# Patient Record
Sex: Female | Born: 1965 | Race: Black or African American | Hispanic: No | Marital: Married | Smoking: Former smoker
Health system: Southern US, Community
[De-identification: ages and names within clinical notes are randomized; demographics above are authoritative.]

## PROBLEM LIST (undated history)

## (undated) DIAGNOSIS — K219 Gastro-esophageal reflux disease without esophagitis: Secondary | ICD-10-CM

## (undated) DIAGNOSIS — I10 Essential (primary) hypertension: Secondary | ICD-10-CM

## (undated) DIAGNOSIS — R51 Headache: Secondary | ICD-10-CM

## (undated) DIAGNOSIS — M1990 Unspecified osteoarthritis, unspecified site: Secondary | ICD-10-CM

## (undated) DIAGNOSIS — E559 Vitamin D deficiency, unspecified: Secondary | ICD-10-CM

## (undated) DIAGNOSIS — Z86010 Personal history of colonic polyps: Secondary | ICD-10-CM

## (undated) DIAGNOSIS — H40059 Ocular hypertension, unspecified eye: Secondary | ICD-10-CM

## (undated) DIAGNOSIS — D242 Benign neoplasm of left breast: Secondary | ICD-10-CM

## (undated) DIAGNOSIS — F419 Anxiety disorder, unspecified: Secondary | ICD-10-CM

## (undated) DIAGNOSIS — R9439 Abnormal result of other cardiovascular function study: Secondary | ICD-10-CM

## (undated) DIAGNOSIS — E669 Obesity, unspecified: Secondary | ICD-10-CM

## (undated) DIAGNOSIS — Z8614 Personal history of Methicillin resistant Staphylococcus aureus infection: Secondary | ICD-10-CM

## (undated) DIAGNOSIS — Z91018 Allergy to other foods: Secondary | ICD-10-CM

## (undated) DIAGNOSIS — M779 Enthesopathy, unspecified: Secondary | ICD-10-CM

## (undated) DIAGNOSIS — E119 Type 2 diabetes mellitus without complications: Secondary | ICD-10-CM

## (undated) DIAGNOSIS — F32A Depression, unspecified: Secondary | ICD-10-CM

## (undated) DIAGNOSIS — F329 Major depressive disorder, single episode, unspecified: Secondary | ICD-10-CM

## (undated) DIAGNOSIS — J45909 Unspecified asthma, uncomplicated: Secondary | ICD-10-CM

## (undated) HISTORY — DX: Personal history of Methicillin resistant Staphylococcus aureus infection: Z86.14

## (undated) HISTORY — PX: KNEE SURGERY: SHX244

## (undated) HISTORY — PX: ABDOMINAL HYSTERECTOMY: SHX81

## (undated) HISTORY — DX: Abnormal result of other cardiovascular function study: R94.39

## (undated) HISTORY — PX: BOWEL RESECTION: SHX1257

---

## 1997-10-07 ENCOUNTER — Emergency Department (HOSPITAL_COMMUNITY): Admission: EM | Admit: 1997-10-07 | Discharge: 1997-10-07 | Payer: Self-pay | Admitting: Emergency Medicine

## 1998-05-06 ENCOUNTER — Emergency Department (HOSPITAL_COMMUNITY): Admission: EM | Admit: 1998-05-06 | Discharge: 1998-05-06 | Payer: Self-pay | Admitting: Emergency Medicine

## 1999-09-18 ENCOUNTER — Other Ambulatory Visit: Admission: RE | Admit: 1999-09-18 | Discharge: 1999-09-18 | Payer: Self-pay | Admitting: Internal Medicine

## 2000-03-25 ENCOUNTER — Emergency Department (HOSPITAL_COMMUNITY): Admission: EM | Admit: 2000-03-25 | Discharge: 2000-03-25 | Payer: Self-pay | Admitting: Emergency Medicine

## 2000-05-13 ENCOUNTER — Emergency Department (HOSPITAL_COMMUNITY): Admission: EM | Admit: 2000-05-13 | Discharge: 2000-05-13 | Payer: Self-pay | Admitting: *Deleted

## 2003-05-03 ENCOUNTER — Emergency Department (HOSPITAL_COMMUNITY): Admission: AD | Admit: 2003-05-03 | Discharge: 2003-05-03 | Payer: Self-pay | Admitting: Emergency Medicine

## 2005-04-06 DIAGNOSIS — Z8614 Personal history of Methicillin resistant Staphylococcus aureus infection: Secondary | ICD-10-CM

## 2005-04-06 HISTORY — DX: Personal history of Methicillin resistant Staphylococcus aureus infection: Z86.14

## 2006-01-11 ENCOUNTER — Emergency Department (HOSPITAL_COMMUNITY): Admission: EM | Admit: 2006-01-11 | Discharge: 2006-01-11 | Payer: Self-pay | Admitting: Emergency Medicine

## 2006-08-05 ENCOUNTER — Inpatient Hospital Stay (HOSPITAL_COMMUNITY): Admission: AD | Admit: 2006-08-05 | Discharge: 2006-08-05 | Payer: Self-pay | Admitting: Obstetrics & Gynecology

## 2006-08-07 ENCOUNTER — Inpatient Hospital Stay (HOSPITAL_COMMUNITY): Admission: AD | Admit: 2006-08-07 | Discharge: 2006-08-07 | Payer: Self-pay | Admitting: Obstetrics & Gynecology

## 2006-08-11 ENCOUNTER — Ambulatory Visit: Payer: Self-pay | Admitting: *Deleted

## 2006-08-11 ENCOUNTER — Encounter (INDEPENDENT_AMBULATORY_CARE_PROVIDER_SITE_OTHER): Payer: Self-pay | Admitting: *Deleted

## 2006-11-02 ENCOUNTER — Inpatient Hospital Stay (HOSPITAL_COMMUNITY): Admission: EM | Admit: 2006-11-02 | Discharge: 2006-11-04 | Payer: Self-pay | Admitting: Cardiology

## 2006-11-02 ENCOUNTER — Encounter: Payer: Self-pay | Admitting: Emergency Medicine

## 2006-11-02 ENCOUNTER — Ambulatory Visit: Payer: Self-pay | Admitting: *Deleted

## 2006-11-22 ENCOUNTER — Emergency Department (HOSPITAL_COMMUNITY): Admission: EM | Admit: 2006-11-22 | Discharge: 2006-11-23 | Payer: Self-pay | Admitting: Emergency Medicine

## 2006-11-23 ENCOUNTER — Ambulatory Visit (HOSPITAL_COMMUNITY): Admission: RE | Admit: 2006-11-23 | Discharge: 2006-11-23 | Payer: Self-pay | Admitting: Family Medicine

## 2006-11-25 ENCOUNTER — Emergency Department (HOSPITAL_COMMUNITY): Admission: EM | Admit: 2006-11-25 | Discharge: 2006-11-26 | Payer: Self-pay | Admitting: Emergency Medicine

## 2006-12-13 ENCOUNTER — Inpatient Hospital Stay (HOSPITAL_COMMUNITY): Admission: AD | Admit: 2006-12-13 | Discharge: 2006-12-14 | Payer: Self-pay | Admitting: Obstetrics & Gynecology

## 2006-12-17 ENCOUNTER — Ambulatory Visit: Payer: Self-pay | Admitting: Obstetrics and Gynecology

## 2006-12-23 ENCOUNTER — Ambulatory Visit: Payer: Self-pay | Admitting: Obstetrics and Gynecology

## 2006-12-31 ENCOUNTER — Ambulatory Visit (HOSPITAL_COMMUNITY): Admission: RE | Admit: 2006-12-31 | Discharge: 2006-12-31 | Payer: Self-pay | Admitting: Obstetrics and Gynecology

## 2006-12-31 ENCOUNTER — Encounter: Payer: Self-pay | Admitting: Obstetrics and Gynecology

## 2006-12-31 ENCOUNTER — Ambulatory Visit: Payer: Self-pay | Admitting: Obstetrics and Gynecology

## 2007-01-07 ENCOUNTER — Ambulatory Visit (HOSPITAL_COMMUNITY): Admission: RE | Admit: 2007-01-07 | Discharge: 2007-01-07 | Payer: Self-pay | Admitting: Obstetrics and Gynecology

## 2007-01-13 ENCOUNTER — Ambulatory Visit: Payer: Self-pay | Admitting: Obstetrics and Gynecology

## 2007-01-26 ENCOUNTER — Ambulatory Visit: Payer: Self-pay | Admitting: Obstetrics & Gynecology

## 2007-01-26 ENCOUNTER — Inpatient Hospital Stay (HOSPITAL_COMMUNITY): Admission: AD | Admit: 2007-01-26 | Discharge: 2007-01-26 | Payer: Self-pay | Admitting: Obstetrics & Gynecology

## 2007-02-03 ENCOUNTER — Inpatient Hospital Stay (HOSPITAL_COMMUNITY): Admission: AD | Admit: 2007-02-03 | Discharge: 2007-02-03 | Payer: Self-pay | Admitting: Gynecology

## 2007-02-27 ENCOUNTER — Inpatient Hospital Stay (HOSPITAL_COMMUNITY): Admission: AD | Admit: 2007-02-27 | Discharge: 2007-02-27 | Payer: Self-pay | Admitting: Obstetrics and Gynecology

## 2007-03-14 ENCOUNTER — Ambulatory Visit: Payer: Self-pay | Admitting: Obstetrics and Gynecology

## 2007-03-14 ENCOUNTER — Inpatient Hospital Stay (HOSPITAL_COMMUNITY): Admission: RE | Admit: 2007-03-14 | Discharge: 2007-03-25 | Payer: Self-pay | Admitting: Obstetrics and Gynecology

## 2007-03-14 ENCOUNTER — Encounter: Payer: Self-pay | Admitting: Obstetrics and Gynecology

## 2007-03-28 ENCOUNTER — Inpatient Hospital Stay (HOSPITAL_COMMUNITY): Admission: AD | Admit: 2007-03-28 | Discharge: 2007-03-28 | Payer: Self-pay | Admitting: Gynecology

## 2007-03-28 ENCOUNTER — Ambulatory Visit: Payer: Self-pay | Admitting: Obstetrics and Gynecology

## 2007-04-03 ENCOUNTER — Inpatient Hospital Stay (HOSPITAL_COMMUNITY): Admission: AD | Admit: 2007-04-03 | Discharge: 2007-04-18 | Payer: Self-pay | Admitting: Obstetrics & Gynecology

## 2007-04-04 ENCOUNTER — Encounter (INDEPENDENT_AMBULATORY_CARE_PROVIDER_SITE_OTHER): Payer: Self-pay | Admitting: Surgery

## 2007-04-14 ENCOUNTER — Encounter: Payer: Self-pay | Admitting: Surgery

## 2007-04-23 ENCOUNTER — Emergency Department (HOSPITAL_COMMUNITY): Admission: EM | Admit: 2007-04-23 | Discharge: 2007-04-24 | Payer: Self-pay | Admitting: *Deleted

## 2007-04-29 ENCOUNTER — Other Ambulatory Visit: Payer: Self-pay | Admitting: Obstetrics & Gynecology

## 2007-04-29 ENCOUNTER — Emergency Department (HOSPITAL_COMMUNITY): Admission: EM | Admit: 2007-04-29 | Discharge: 2007-04-29 | Payer: Self-pay | Admitting: Emergency Medicine

## 2007-04-29 ENCOUNTER — Inpatient Hospital Stay (HOSPITAL_COMMUNITY): Admission: AD | Admit: 2007-04-29 | Discharge: 2007-04-29 | Payer: Self-pay | Admitting: Obstetrics & Gynecology

## 2008-05-22 ENCOUNTER — Encounter: Admission: RE | Admit: 2008-05-22 | Discharge: 2008-05-22 | Payer: Self-pay | Admitting: Family Medicine

## 2008-10-13 ENCOUNTER — Emergency Department (HOSPITAL_COMMUNITY): Admission: EM | Admit: 2008-10-13 | Discharge: 2008-10-13 | Payer: Self-pay | Admitting: Emergency Medicine

## 2008-10-28 ENCOUNTER — Inpatient Hospital Stay (HOSPITAL_COMMUNITY): Admission: EM | Admit: 2008-10-28 | Discharge: 2008-11-02 | Payer: Self-pay | Admitting: Emergency Medicine

## 2008-10-31 DIAGNOSIS — F411 Generalized anxiety disorder: Secondary | ICD-10-CM | POA: Insufficient documentation

## 2008-10-31 HISTORY — DX: Generalized anxiety disorder: F41.1

## 2008-11-29 DIAGNOSIS — K219 Gastro-esophageal reflux disease without esophagitis: Secondary | ICD-10-CM | POA: Insufficient documentation

## 2008-12-02 ENCOUNTER — Inpatient Hospital Stay (HOSPITAL_COMMUNITY): Admission: EM | Admit: 2008-12-02 | Discharge: 2008-12-04 | Payer: Self-pay | Admitting: Emergency Medicine

## 2009-01-19 ENCOUNTER — Emergency Department (HOSPITAL_COMMUNITY): Admission: EM | Admit: 2009-01-19 | Discharge: 2009-01-19 | Payer: Self-pay | Admitting: Emergency Medicine

## 2009-02-23 ENCOUNTER — Inpatient Hospital Stay (HOSPITAL_COMMUNITY): Admission: EM | Admit: 2009-02-23 | Discharge: 2009-02-25 | Payer: Self-pay | Admitting: Emergency Medicine

## 2009-03-05 DIAGNOSIS — K7689 Other specified diseases of liver: Secondary | ICD-10-CM | POA: Insufficient documentation

## 2009-03-05 DIAGNOSIS — I10 Essential (primary) hypertension: Secondary | ICD-10-CM | POA: Insufficient documentation

## 2009-03-05 DIAGNOSIS — E1159 Type 2 diabetes mellitus with other circulatory complications: Secondary | ICD-10-CM | POA: Insufficient documentation

## 2009-03-05 DIAGNOSIS — F329 Major depressive disorder, single episode, unspecified: Secondary | ICD-10-CM | POA: Insufficient documentation

## 2009-03-06 ENCOUNTER — Ambulatory Visit: Payer: Self-pay | Admitting: Cardiology

## 2009-03-06 DIAGNOSIS — R079 Chest pain, unspecified: Secondary | ICD-10-CM

## 2009-03-06 DIAGNOSIS — M549 Dorsalgia, unspecified: Secondary | ICD-10-CM | POA: Insufficient documentation

## 2009-03-12 ENCOUNTER — Telehealth (INDEPENDENT_AMBULATORY_CARE_PROVIDER_SITE_OTHER): Payer: Self-pay | Admitting: *Deleted

## 2009-03-13 ENCOUNTER — Telehealth (INDEPENDENT_AMBULATORY_CARE_PROVIDER_SITE_OTHER): Payer: Self-pay

## 2009-03-14 ENCOUNTER — Telehealth (INDEPENDENT_AMBULATORY_CARE_PROVIDER_SITE_OTHER): Payer: Self-pay

## 2009-03-15 ENCOUNTER — Telehealth: Payer: Self-pay | Admitting: Cardiology

## 2009-03-15 ENCOUNTER — Encounter: Payer: Self-pay | Admitting: Cardiology

## 2009-03-19 ENCOUNTER — Telehealth (INDEPENDENT_AMBULATORY_CARE_PROVIDER_SITE_OTHER): Payer: Self-pay

## 2009-03-27 ENCOUNTER — Telehealth (INDEPENDENT_AMBULATORY_CARE_PROVIDER_SITE_OTHER): Payer: Self-pay | Admitting: *Deleted

## 2009-03-28 ENCOUNTER — Ambulatory Visit: Payer: Self-pay | Admitting: Internal Medicine

## 2009-03-28 ENCOUNTER — Ambulatory Visit: Payer: Self-pay

## 2009-03-28 ENCOUNTER — Ambulatory Visit: Payer: Self-pay | Admitting: Cardiology

## 2009-03-28 ENCOUNTER — Encounter (HOSPITAL_COMMUNITY): Admission: RE | Admit: 2009-03-28 | Discharge: 2009-04-05 | Payer: Self-pay | Admitting: Cardiology

## 2009-03-29 ENCOUNTER — Emergency Department (HOSPITAL_COMMUNITY): Admission: EM | Admit: 2009-03-29 | Discharge: 2009-03-30 | Payer: Self-pay | Admitting: Emergency Medicine

## 2009-04-01 ENCOUNTER — Encounter: Payer: Self-pay | Admitting: Cardiology

## 2009-04-02 ENCOUNTER — Telehealth: Payer: Self-pay | Admitting: Cardiology

## 2009-04-02 LAB — CONVERTED CEMR LAB
BUN: 10 mg/dL
CO2: 28 meq/L
Calcium: 8.6 mg/dL
Chloride: 100 meq/L
Creatinine, Ser: 0.6 mg/dL
GFR calc non Af Amer: 138.91 mL/min
Glucose, Bld: 104 mg/dL — ABNORMAL HIGH
Potassium: 3.7 meq/L
Sodium: 137 meq/L

## 2009-04-08 ENCOUNTER — Telehealth: Payer: Self-pay | Admitting: Cardiology

## 2009-04-28 IMAGING — CT CT PELVIS W/ CM
1 of 6 series · 13 of 32 positions shown, 18 images · IV contrast ([ID] GASTRO-MX & [ID] OMNIP 300%)
Comparison: 11/23/06.

CLINICAL DATA: Approximately 3 weeks postop from hysterectomy.  Abdominal and pelvic pain.  Fever and nausea.  Evaluate for abscess.
ABDOMEN CT WITH CONTRAST:
TECHNIQUE: Multidetector CT imaging of the abdomen was performed following the standard protocol during bolus administration of intravenous contrast.
Contrast:  100 cc Omnipaque 300 IV and oral contrast.
TECHNIQUE: Multidetector CT imaging of the pelvis was performed following the standard protocol during bolus administration of intravenous contrast.

[Series 4: recon 3: abd pelvis · axial · 0.70mm/px · z∈[-414,-46]mm · 13 of 426 slices shown, 18 images]
[im 29/426  soft-tissue]
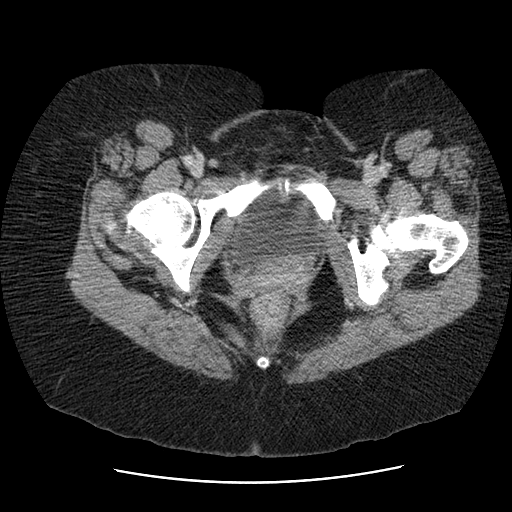
[im 29/426  bone]
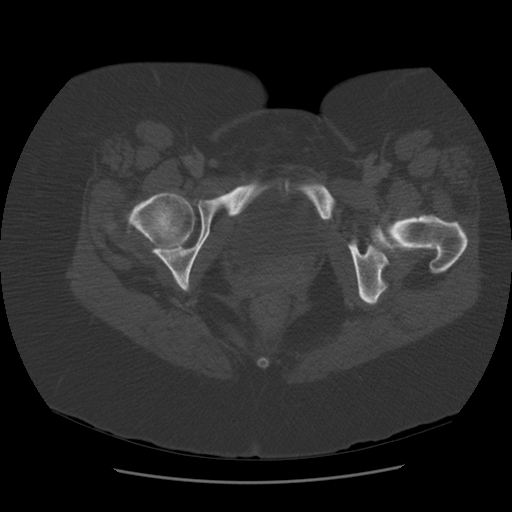
[im 57/426  soft-tissue]
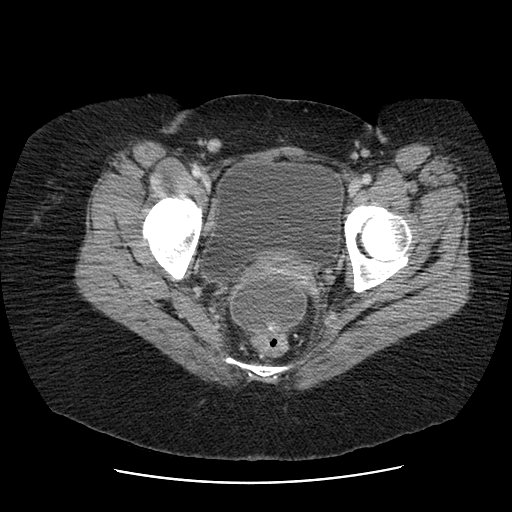
[im 86/426  soft-tissue]
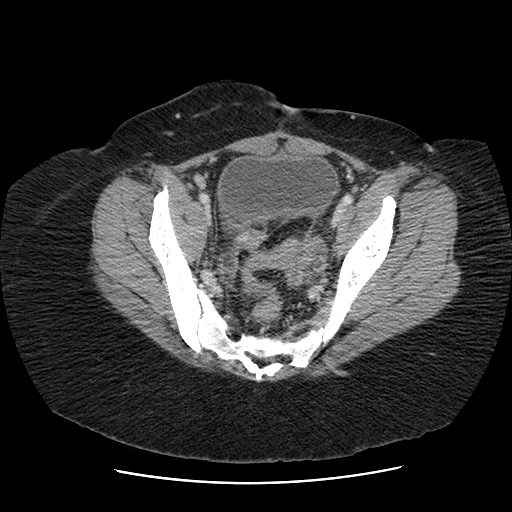
[im 142/426  soft-tissue]
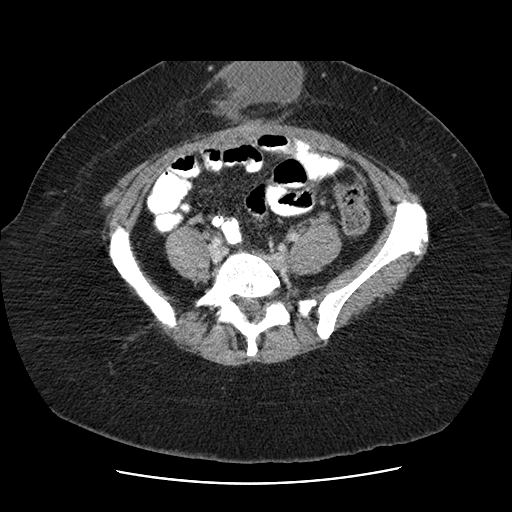
[im 171/426  soft-tissue]
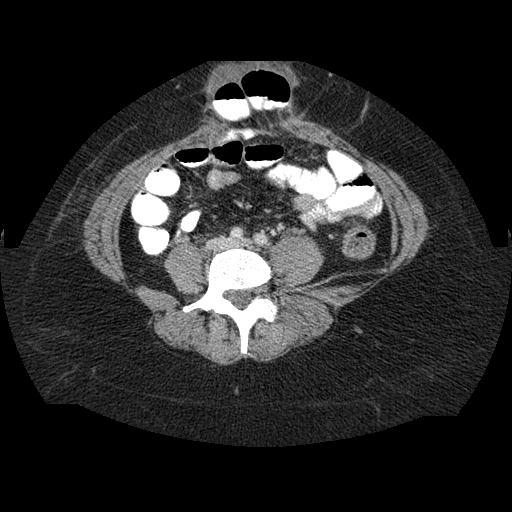
[im 199/426  soft-tissue]
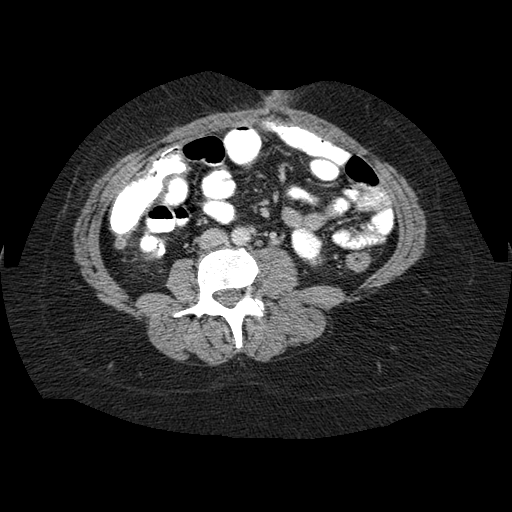
[im 227/426  soft-tissue]
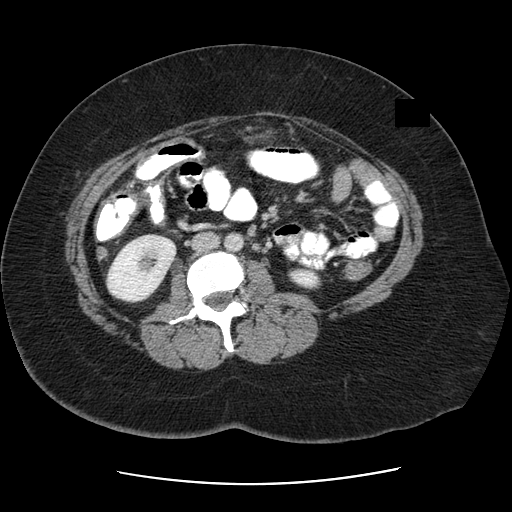
[im 256/426  soft-tissue]
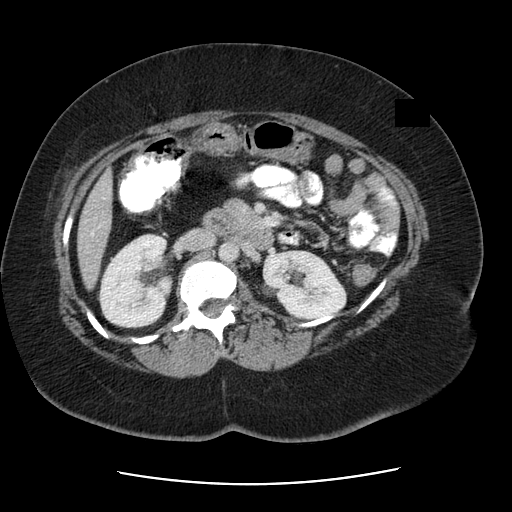
[im 284/426  soft-tissue]
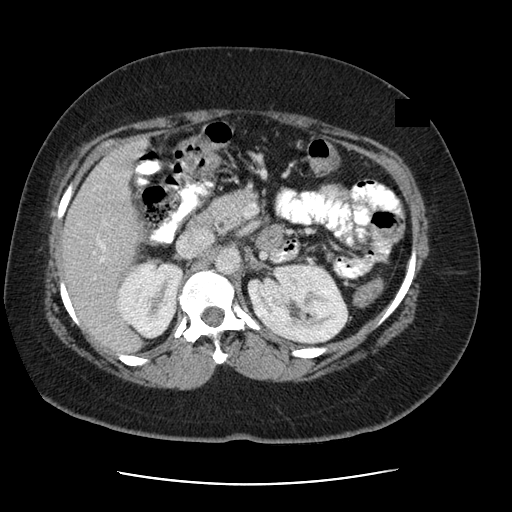
[im 284/426  bone]
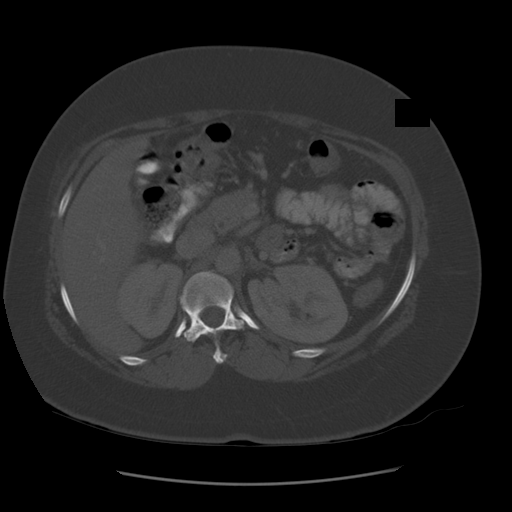
[im 312/426  lung]
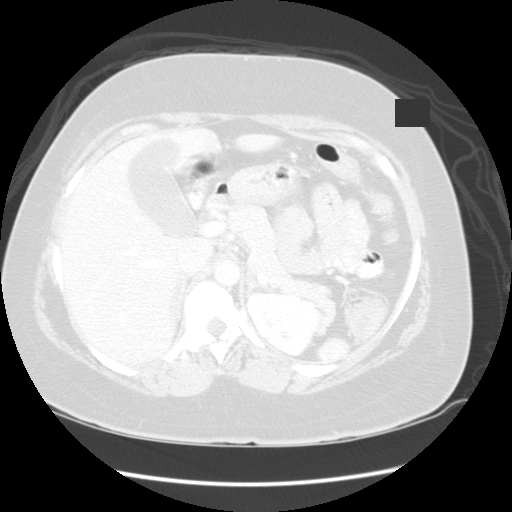
[im 341/426  soft-tissue]
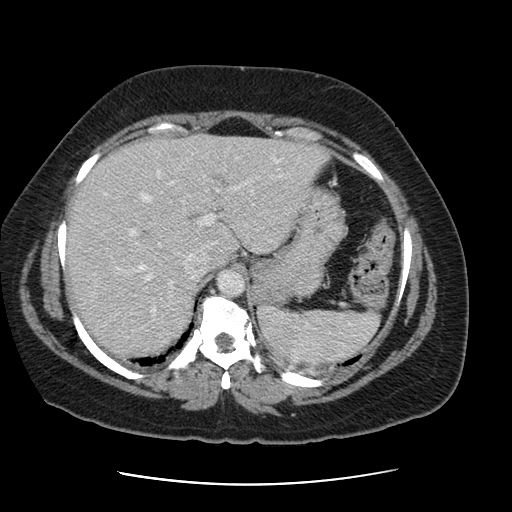
[im 341/426  lung]
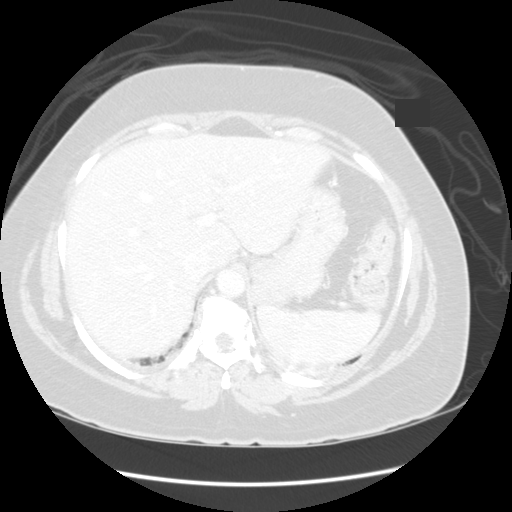
[im 369/426  soft-tissue]
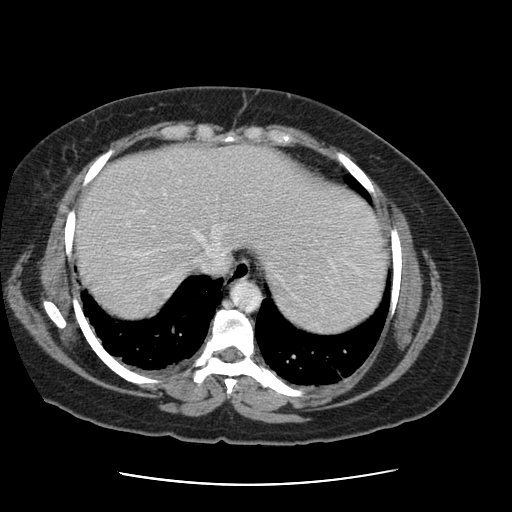
[im 369/426  lung]
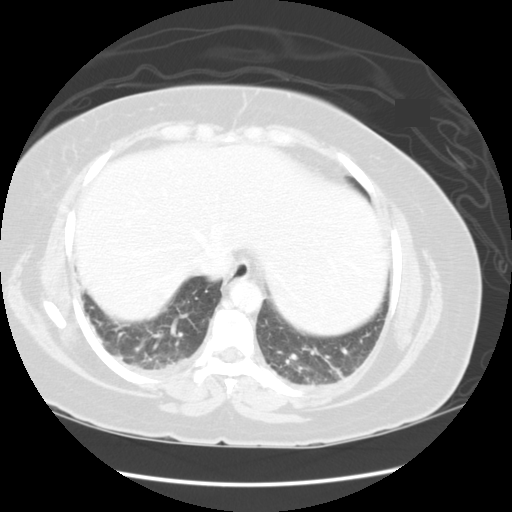
[im 397/426  soft-tissue]
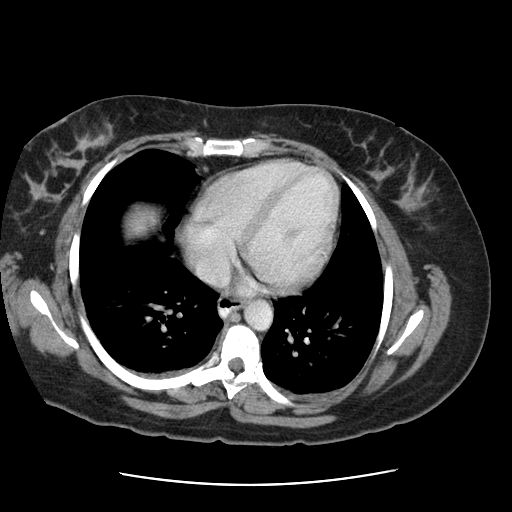
[im 397/426  lung]
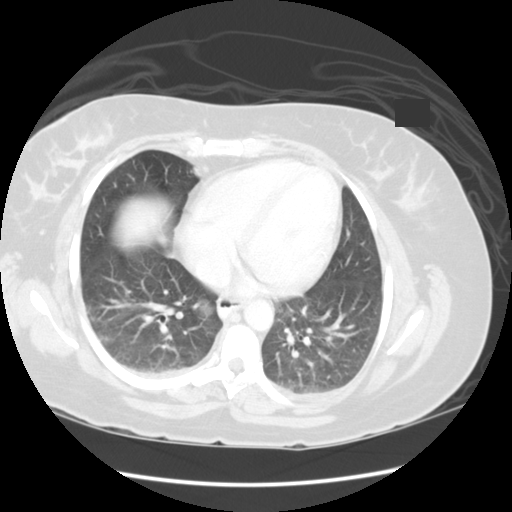

[13 of 32 positions shown; findings below may reference images not displayed]

FINDINGS: Tiny pleural effusions are noted bilaterally with minimal bibasilar atelectasis.  The liver, gallbladder, pancreas, spleen, adrenal glands, and kidneys are unremarkable in appearance except for a tiny less than 1 cm cyst in the lower pole of the left kidney.   There is no evidence of hydronephrosis.   There is no evidence of abscess or ascites within the abdomen.   There is no evidence of mass or dilated bowel loops.
IMPRESSION: 1.  No evidence of abscess or other acute findings within the abdomen.
2.  Tiny bilateral pleural effusions and minimal bibasilar atelectasis.
PELVIS CT WITH CONTRAST:
FINDINGS: A periumbilical anterior abdominal wall hernia is seen containing a loop of small bowel.   There is no evidence of bowel obstruction.  Fluid is also seen within the hernia sac inferior to the herniated bowel loops which measures 5.5 x 3.8 cm.   Abscess cannot be excluded.  
The uterus is surgically absent.  A rim-enhancing fluid collection is seen within the pelvic cul-de-sac which is new and measures 5.3 x 3.9 cm.   This is also suspicious for abscess.  There is mild stranding within the pelvic fat, but no other abscess is identified.   There is no evidence of pelvic soft tissue mass.
IMPRESSION: 1.  Postoperative changes from recent hysterectomy.   5.3 x 3.9 cm rim-enhancing fluid collection in the pelvic cul-de-sac, suspicious for abscess.
2.  New periumbilical anterior abdominal wall hernia containing a loop of small bowel.  5.5 x 3.8 cm fluid collection in the hernia sac inferior to bowel loops; abscess cannot be excluded.
This report is delayed due to PACS failure.

## 2009-05-07 ENCOUNTER — Encounter (INDEPENDENT_AMBULATORY_CARE_PROVIDER_SITE_OTHER): Payer: Self-pay | Admitting: *Deleted

## 2009-10-02 ENCOUNTER — Ambulatory Visit: Payer: Self-pay | Admitting: Obstetrics and Gynecology

## 2009-10-04 ENCOUNTER — Encounter: Admission: RE | Admit: 2009-10-04 | Discharge: 2009-10-04 | Payer: Self-pay | Admitting: Obstetrics & Gynecology

## 2009-10-09 ENCOUNTER — Emergency Department (HOSPITAL_COMMUNITY): Admission: EM | Admit: 2009-10-09 | Discharge: 2009-10-09 | Payer: Self-pay | Admitting: Emergency Medicine

## 2009-11-27 ENCOUNTER — Emergency Department (HOSPITAL_COMMUNITY): Admission: EM | Admit: 2009-11-27 | Discharge: 2009-11-27 | Payer: Self-pay | Admitting: Emergency Medicine

## 2009-12-03 ENCOUNTER — Emergency Department (HOSPITAL_COMMUNITY): Admission: EM | Admit: 2009-12-03 | Discharge: 2009-12-03 | Payer: Self-pay | Admitting: Emergency Medicine

## 2010-01-05 ENCOUNTER — Ambulatory Visit: Payer: Self-pay | Admitting: Cardiology

## 2010-01-05 ENCOUNTER — Observation Stay (HOSPITAL_COMMUNITY): Admission: EM | Admit: 2010-01-05 | Discharge: 2010-01-07 | Payer: Self-pay | Admitting: Emergency Medicine

## 2010-01-05 ENCOUNTER — Ambulatory Visit: Payer: Self-pay | Admitting: Cardiovascular Disease

## 2010-01-06 ENCOUNTER — Encounter (INDEPENDENT_AMBULATORY_CARE_PROVIDER_SITE_OTHER): Payer: Self-pay | Admitting: Internal Medicine

## 2010-01-13 ENCOUNTER — Emergency Department (HOSPITAL_COMMUNITY): Admission: EM | Admit: 2010-01-13 | Discharge: 2010-01-13 | Payer: Self-pay | Admitting: Emergency Medicine

## 2010-04-11 ENCOUNTER — Encounter
Admission: RE | Admit: 2010-04-11 | Discharge: 2010-04-11 | Payer: Self-pay | Source: Home / Self Care | Attending: Family Medicine | Admitting: Family Medicine

## 2010-04-27 ENCOUNTER — Encounter: Payer: Self-pay | Admitting: Obstetrics and Gynecology

## 2010-04-27 ENCOUNTER — Encounter: Payer: Self-pay | Admitting: *Deleted

## 2010-05-04 LAB — CONVERTED CEMR LAB
Alkaline Phosphatase: 61 units/L (ref 39–117)
Bilirubin, Direct: 0 mg/dL (ref 0.0–0.3)
HDL: 55.1 mg/dL (ref >39.00)
LDL Cholesterol: 79 mg/dL (ref 0–99)
Total Bilirubin: 0.7 mg/dL (ref 0.3–1.2)
Total CHOL/HDL Ratio: 3
Total Protein: 6.9 g/dL (ref 6.0–8.3)
Triglycerides: 132 mg/dL (ref 0.0–149.0)
VLDL: 26.4 mg/dL (ref 0.0–40.0)

## 2010-05-06 NOTE — Miscellaneous (Signed)
Summary: Outpatient Coinsurance Notice  Outpatient Coinsurance Notice   Imported By: Marylou Mccoy 04/17/2009 14:54:10  _____________________________________________________________________  External Attachment:    Type:   Image     Comment:   External Document

## 2010-05-06 NOTE — Medication Information (Signed)
Summary: BP Monitor  BP Monitor   Imported By: Marylou Mccoy 04/16/2009 16:31:43  _____________________________________________________________________  External Attachment:    Type:   Image     Comment:   External Document

## 2010-05-06 NOTE — Progress Notes (Signed)
Summary: patient has flu/what can she take for relief of symptoms  Phone Note Call from Patient Call back at [phone]   Caller: Patient Reason for Call: Talk to Nurse Summary of Call: Patient has flu.  Would like to know what she can take to relieve symptoms. Initial call taken by: Burnard Leigh,  April 08, 2009 8:43 AM  Follow-up for Phone Call        talked with patient--pt has had flu for several days---dry cough and temperature--wants recommendation for OTC med for symptoms--will review with Dr Gae Gallop, RN, BSN  April 08, 2009 9:06 AM      Appended Document: patient has flu/what can she take for relief of symptoms OK to take Tylenol, Coricidin, Benadryl.  Would avoid phenylephrine-containing products.   Appended Document: patient has flu/what can she take for relief of symptoms talked with patient

## 2010-05-06 NOTE — Letter (Signed)
Summary: Appointment - Missed  [facility], Main Office  [address]   [place], [place] [address]   Phone: [phone]  Fax: [phone]     May 07, 2009 MRN: [id number]   [facility] [address] [place], [place]  [address]   Dear Ms. Benjamine Mola,  Our records indicate you missed your appointment on 05/06/2009 with Dr. Shirlee Latch.  It is very important that we reach you to reschedule this appointment. We look forward to participating in your health care needs. Please contact us at the number listed above at your earliest convenience to reschedule this appointment.     Sincerely,   [facility] Scheduling Team

## 2010-06-19 LAB — COMPREHENSIVE METABOLIC PANEL
Alkaline Phosphatase: 97 U/L (ref 39–117)
BUN: 13 mg/dL (ref 6–23)
CO2: 28 mEq/L (ref 19–32)
Chloride: 106 mEq/L (ref 96–112)
Creatinine, Ser: 0.62 mg/dL (ref 0.4–1.2)
GFR calc non Af Amer: >60 mL/min (ref >60)
Glucose, Bld: 150 mg/dL — ABNORMAL HIGH (ref 70–99)
Potassium: 3.2 mEq/L — ABNORMAL LOW (ref 3.5–5.1)
Total Bilirubin: 0.3 mg/dL (ref 0.3–1.2)
Total Protein: 7.7 g/dL (ref 6.0–8.3)

## 2010-06-19 LAB — CK TOTAL AND CKMB (NOT AT ARMC)
CK, MB: 0.5 ng/mL (ref 0.3–4.0)
Total CK: 48 U/L (ref 7–177)

## 2010-06-19 LAB — BASIC METABOLIC PANEL
BUN: 11 mg/dL (ref 6–23)
BUN: 12 mg/dL (ref 6–23)
BUN: 8 mg/dL (ref 6–23)
CO2: 25 mEq/L (ref 19–32)
CO2: 30 mEq/L (ref 19–32)
Calcium: 8.4 mg/dL (ref 8.4–10.5)
Calcium: 8.9 mg/dL (ref 8.4–10.5)
Calcium: 9.4 mg/dL (ref 8.4–10.5)
Chloride: 104 mEq/L (ref 96–112)
Creatinine, Ser: 0.59 mg/dL (ref 0.4–1.2)
Creatinine, Ser: 0.75 mg/dL (ref 0.4–1.2)
GFR calc Af Amer: >60 mL/min (ref >60)
GFR calc Af Amer: >60 mL/min (ref >60)
GFR calc Af Amer: >60 mL/min (ref >60)
GFR calc non Af Amer: >60 mL/min (ref >60)
GFR calc non Af Amer: >60 mL/min (ref >60)
GFR calc non Af Amer: >60 mL/min (ref >60)
Glucose, Bld: 115 mg/dL — ABNORMAL HIGH (ref 70–99)
Glucose, Bld: 99 mg/dL (ref 70–99)
Potassium: 3.4 mEq/L — ABNORMAL LOW (ref 3.5–5.1)
Potassium: 3.7 mEq/L (ref 3.5–5.1)
Sodium: 140 mEq/L (ref 135–145)
Sodium: 140 mEq/L (ref 135–145)
Sodium: 142 mEq/L (ref 135–145)

## 2010-06-19 LAB — DIFFERENTIAL
Basophils Absolute: 0 10*3/uL (ref 0.0–0.1)
Basophils Absolute: 0.1 10*3/uL (ref 0.0–0.1)
Basophils Relative: 0 % (ref 0–1)
Basophils Relative: 1 % (ref 0–1)
Lymphocytes Relative: 37 % (ref 12–46)
Lymphocytes Relative: 43 % (ref 12–46)
Lymphocytes Relative: 50 % — ABNORMAL HIGH (ref 12–46)
Lymphs Abs: 3.2 10*3/uL (ref 0.7–4.0)
Lymphs Abs: 5.7 10*3/uL — ABNORMAL HIGH (ref 0.7–4.0)
Monocytes Absolute: 0.4 10*3/uL (ref 0.1–1.0)
Monocytes Absolute: 0.4 10*3/uL (ref 0.1–1.0)
Monocytes Relative: 3 % (ref 3–12)
Monocytes Relative: 5 % (ref 3–12)
Neutro Abs: 3.5 10*3/uL (ref 1.7–7.7)
Neutro Abs: 4.1 10*3/uL (ref 1.7–7.7)
Neutro Abs: 5.1 10*3/uL (ref 1.7–7.7)
Neutrophils Relative %: 44 % (ref 43–77)
Neutrophils Relative %: 48 % (ref 43–77)
Neutrophils Relative %: 55 % (ref 43–77)

## 2010-06-19 LAB — POCT CARDIAC MARKERS
CKMB, poc: <1 ng/mL — ABNORMAL LOW (ref 1.0–8.0)
Myoglobin, poc: 16.1 ng/mL (ref 12–200)
Troponin i, poc: <0.05 ng/mL (ref 0.00–0.09)

## 2010-06-19 LAB — D-DIMER, QUANTITATIVE: D-Dimer, Quant: <0.22 ug/mL-FEU (ref 0.00–0.48)

## 2010-06-19 LAB — 5 HIAA, QUANTITATIVE, URINE, 24 HOUR: 5-HIAA, 24 Hr Urine: 3 mg/24 h (ref <=6.0)

## 2010-06-19 LAB — VMA + CREATININE, URINE (TIMED COLLECTION)
Creatinine 24h urine: 0.92 g/(24.h) (ref 0.63–2.50)
Vanillylmandelic Acid, (VMA): 1.7 mg/24 h (ref <=6.0)
Volume, Urine-VMAUR: 1300 mL

## 2010-06-19 LAB — CBC
HCT: 39.6 % (ref 36.0–46.0)
Hemoglobin: 12.2 g/dL (ref 12.0–15.0)
Hemoglobin: 13.2 g/dL (ref 12.0–15.0)
MCH: 29.9 pg (ref 26.0–34.0)
MCH: 30.9 pg (ref 26.0–34.0)
MCHC: 32.4 g/dL (ref 30.0–36.0)
MCHC: 32.4 g/dL (ref 30.0–36.0)
MCV: 91.3 fL (ref 78.0–100.0)
Platelets: 335 10*3/uL (ref 150–400)
RBC: 4.34 MIL/uL (ref 3.87–5.11)
RDW: 13.9 % (ref 11.5–15.5)
RDW: 14 % (ref 11.5–15.5)
WBC: 11.5 10*3/uL — ABNORMAL HIGH (ref 4.0–10.5)
WBC: 7.4 10*3/uL (ref 4.0–10.5)

## 2010-06-19 LAB — MRSA PCR SCREENING: MRSA by PCR: NEGATIVE

## 2010-06-19 LAB — TROPONIN I: Troponin I: 0.01 ng/mL (ref 0.00–0.06)

## 2010-06-19 LAB — METANEPHRINES, URINE, 24 HOUR: Metaneph Total, Ur: 207 mcg/24 h (ref 182–739)

## 2010-06-19 LAB — CARDIAC PANEL(CRET KIN+CKTOT+MB+TROPI)
CK, MB: 0.6 ng/mL (ref 0.3–4.0)
Total CK: 47 U/L (ref 7–177)

## 2010-06-20 LAB — POCT CARDIAC MARKERS
CKMB, poc: <1 ng/mL — ABNORMAL LOW (ref 1.0–8.0)
Myoglobin, poc: 22.8 ng/mL (ref 12–200)

## 2010-07-07 LAB — CBC
Hemoglobin: 12.7 g/dL (ref 12.0–15.0)
MCHC: 34.4 g/dL (ref 30.0–36.0)
Platelets: 300 10*3/uL (ref 150–400)
RDW: 13.4 % (ref 11.5–15.5)

## 2010-07-07 LAB — DIFFERENTIAL
Eosinophils Absolute: 0.1 10*3/uL (ref 0.0–0.7)
Lymphs Abs: 2.8 10*3/uL (ref 0.7–4.0)
Monocytes Relative: 5 % (ref 3–12)
Neutro Abs: 4.2 10*3/uL (ref 1.7–7.7)
Neutrophils Relative %: 56 % (ref 43–77)

## 2010-07-07 LAB — COMPREHENSIVE METABOLIC PANEL
ALT: 20 U/L (ref 0–35)
Albumin: 3.5 g/dL (ref 3.5–5.2)
Calcium: 8.1 mg/dL — ABNORMAL LOW (ref 8.4–10.5)
Glucose, Bld: 93 mg/dL (ref 70–99)
Sodium: 137 mEq/L (ref 135–145)
Total Protein: 6.5 g/dL (ref 6.0–8.3)

## 2010-07-07 LAB — POCT CARDIAC MARKERS
CKMB, poc: <1 ng/mL — ABNORMAL LOW (ref 1.0–8.0)
Myoglobin, poc: 22.5 ng/mL (ref 12–200)

## 2010-07-09 LAB — DIFFERENTIAL
Basophils Absolute: 0 10*3/uL (ref 0.0–0.1)
Basophils Relative: 1 % (ref 0–1)
Eosinophils Absolute: 0.1 10*3/uL (ref 0.0–0.7)
Eosinophils Absolute: 0.2 10*3/uL (ref 0.0–0.7)
Eosinophils Relative: 2 % (ref 0–5)
Eosinophils Relative: 2 % (ref 0–5)
Lymphs Abs: 3.1 10*3/uL (ref 0.7–4.0)
Monocytes Relative: 6 % (ref 3–12)
Neutrophils Relative %: 48 % (ref 43–77)

## 2010-07-09 LAB — CARDIAC PANEL(CRET KIN+CKTOT+MB+TROPI)
CK, MB: 0.7 ng/mL (ref 0.3–4.0)
CK, MB: 0.7 ng/mL (ref 0.3–4.0)
Relative Index: INVALID (ref 0.0–2.5)
Relative Index: INVALID (ref 0.0–2.5)
Total CK: 70 U/L (ref 7–177)
Troponin I: 0.01 ng/mL (ref 0.00–0.06)
Troponin I: 0.01 ng/mL (ref 0.00–0.06)

## 2010-07-09 LAB — BASIC METABOLIC PANEL
BUN: 12 mg/dL (ref 6–23)
Calcium: 8.1 mg/dL — ABNORMAL LOW (ref 8.4–10.5)
Creatinine, Ser: 0.69 mg/dL (ref 0.4–1.2)
GFR calc Af Amer: >60 mL/min (ref >60)
GFR calc Af Amer: >60 mL/min (ref >60)
GFR calc non Af Amer: >60 mL/min (ref >60)
Glucose, Bld: 113 mg/dL — ABNORMAL HIGH (ref 70–99)
Potassium: 4.3 mEq/L (ref 3.5–5.1)
Sodium: 138 mEq/L (ref 135–145)

## 2010-07-09 LAB — TSH: TSH: 0.507 u[IU]/mL (ref 0.350–4.500)

## 2010-07-09 LAB — COMPREHENSIVE METABOLIC PANEL
ALT: 24 U/L (ref 0–35)
AST: 39 U/L — ABNORMAL HIGH (ref 0–37)
CO2: 22 mEq/L (ref 19–32)
Chloride: 108 mEq/L (ref 96–112)
GFR calc Af Amer: >60 mL/min (ref >60)
GFR calc non Af Amer: >60 mL/min (ref >60)
Potassium: 5 mEq/L (ref 3.5–5.1)
Sodium: 137 mEq/L (ref 135–145)
Total Bilirubin: 1.7 mg/dL — ABNORMAL HIGH (ref 0.3–1.2)

## 2010-07-09 LAB — CBC
HCT: 37.6 % (ref 36.0–46.0)
Hemoglobin: 13 g/dL (ref 12.0–15.0)
MCHC: 33.9 g/dL (ref 30.0–36.0)
MCV: 92.8 fL (ref 78.0–100.0)
Platelets: 308 10*3/uL (ref 150–400)
Platelets: 326 10*3/uL (ref 150–400)
RBC: 4.05 MIL/uL (ref 3.87–5.11)
RBC: 4.58 MIL/uL (ref 3.87–5.11)
RDW: 13.7 % (ref 11.5–15.5)
WBC: 7 10*3/uL (ref 4.0–10.5)

## 2010-07-09 LAB — POCT CARDIAC MARKERS
CKMB, poc: <1 ng/mL — ABNORMAL LOW (ref 1.0–8.0)
Troponin i, poc: <0.05 ng/mL (ref 0.00–0.09)
Troponin i, poc: <0.05 ng/mL (ref 0.00–0.09)

## 2010-07-09 LAB — HEPATIC FUNCTION PANEL
AST: 20 U/L (ref 0–37)
Albumin: 3.7 g/dL (ref 3.5–5.2)
Total Protein: 6.8 g/dL (ref 6.0–8.3)

## 2010-07-09 LAB — LIPID PANEL
Cholesterol: 135 mg/dL (ref 0–200)
Total CHOL/HDL Ratio: 2.5 RATIO

## 2010-07-09 LAB — D-DIMER, QUANTITATIVE: D-Dimer, Quant: 0.36 ug/mL-FEU (ref 0.00–0.48)

## 2010-07-09 LAB — PHOSPHORUS: Phosphorus: 3.2 mg/dL (ref 2.3–4.6)

## 2010-07-12 LAB — COMPREHENSIVE METABOLIC PANEL
Albumin: 3.8 g/dL (ref 3.5–5.2)
Alkaline Phosphatase: 70 U/L (ref 39–117)
BUN: 7 mg/dL (ref 6–23)
Calcium: 8.5 mg/dL (ref 8.4–10.5)
Creatinine, Ser: 0.73 mg/dL (ref 0.4–1.2)
Glucose, Bld: 180 mg/dL — ABNORMAL HIGH (ref 70–99)
Potassium: 5 mEq/L (ref 3.5–5.1)
Total Protein: 6.8 g/dL (ref 6.0–8.3)

## 2010-07-12 LAB — CK TOTAL AND CKMB (NOT AT ARMC)
CK, MB: 0.5 ng/mL (ref 0.3–4.0)
Relative Index: INVALID (ref 0.0–2.5)
Total CK: 49 U/L (ref 7–177)

## 2010-07-12 LAB — CBC
HCT: 38.3 % (ref 36.0–46.0)
Hemoglobin: 12.9 g/dL (ref 12.0–15.0)
MCHC: 33.6 g/dL (ref 30.0–36.0)
Platelets: 393 10*3/uL (ref 150–400)
RDW: 14 % (ref 11.5–15.5)

## 2010-07-12 LAB — T3, FREE: T3, Free: 2 pg/mL — ABNORMAL LOW (ref 2.3–4.2)

## 2010-07-12 LAB — DIFFERENTIAL
Basophils Relative: 2 % — ABNORMAL HIGH (ref 0–1)
Lymphocytes Relative: 58 % — ABNORMAL HIGH (ref 12–46)
Lymphs Abs: 5.4 10*3/uL — ABNORMAL HIGH (ref 0.7–4.0)
Monocytes Absolute: 0.6 10*3/uL (ref 0.1–1.0)
Monocytes Relative: 6 % (ref 3–12)
Neutro Abs: 3.1 10*3/uL (ref 1.7–7.7)
Neutrophils Relative %: 33 % — ABNORMAL LOW (ref 43–77)

## 2010-07-12 LAB — CARDIAC PANEL(CRET KIN+CKTOT+MB+TROPI)
CK, MB: 0.5 ng/mL (ref 0.3–4.0)
Relative Index: INVALID (ref 0.0–2.5)
Total CK: 48 U/L (ref 7–177)
Troponin I: 0.01 ng/mL (ref 0.00–0.06)

## 2010-07-12 LAB — TROPONIN I: Troponin I: 0.01 ng/mL (ref 0.00–0.06)

## 2010-07-12 LAB — GLUCOSE, CAPILLARY: Glucose-Capillary: 162 mg/dL — ABNORMAL HIGH (ref 70–99)

## 2010-07-12 LAB — TSH: TSH: 0.263 u[IU]/mL — ABNORMAL LOW (ref 0.350–4.500)

## 2010-07-13 LAB — CBC
HCT: 35.1 % — ABNORMAL LOW (ref 36.0–46.0)
HCT: 37.6 % (ref 36.0–46.0)
Hemoglobin: 11.8 g/dL — ABNORMAL LOW (ref 12.0–15.0)
Hemoglobin: 12.7 g/dL (ref 12.0–15.0)
Hemoglobin: 13 g/dL (ref 12.0–15.0)
MCHC: 33.6 g/dL (ref 30.0–36.0)
MCHC: 33.7 g/dL (ref 30.0–36.0)
MCHC: 33.8 g/dL (ref 30.0–36.0)
MCV: 92.8 fL (ref 78.0–100.0)
MCV: 94.1 fL (ref 78.0–100.0)
MCV: 92.3 fL (ref 78.0–100.0)
RBC: 3.76 MIL/uL — ABNORMAL LOW (ref 3.87–5.11)
RBC: 4 MIL/uL (ref 3.87–5.11)
RBC: 4.17 MIL/uL (ref 3.87–5.11)
RBC: 4.08 MIL/uL (ref 3.87–5.11)
RDW: 14.3 % (ref 11.5–15.5)
WBC: 11 10*3/uL — ABNORMAL HIGH (ref 4.0–10.5)
WBC: 5.2 10*3/uL (ref 4.0–10.5)

## 2010-07-13 LAB — COMPREHENSIVE METABOLIC PANEL
ALT: 16 U/L (ref 0–35)
Alkaline Phosphatase: 50 U/L (ref 39–117)
BUN: 6 mg/dL (ref 6–23)
CO2: 19 mEq/L (ref 19–32)
CO2: 26 mEq/L (ref 19–32)
Calcium: 8.8 mg/dL (ref 8.4–10.5)
Chloride: 110 mEq/L (ref 96–112)
Chloride: 108 mEq/L (ref 96–112)
Creatinine, Ser: 0.68 mg/dL (ref 0.4–1.2)
GFR calc non Af Amer: >60 mL/min (ref >60)
GFR calc non Af Amer: >60 mL/min (ref >60)
Glucose, Bld: 144 mg/dL — ABNORMAL HIGH (ref 70–99)
Glucose, Bld: 152 mg/dL — ABNORMAL HIGH (ref 70–99)
Potassium: 4.7 mEq/L (ref 3.5–5.1)
Sodium: 140 mEq/L (ref 135–145)
Total Bilirubin: 1.2 mg/dL (ref 0.3–1.2)
Total Bilirubin: 0.2 mg/dL — ABNORMAL LOW (ref 0.3–1.2)

## 2010-07-13 LAB — ELECTROLYTE PANEL: CO2: 24 mEq/L (ref 19–32)

## 2010-07-13 LAB — BASIC METABOLIC PANEL
BUN: 5 mg/dL — ABNORMAL LOW (ref 6–23)
CO2: 30 mEq/L (ref 19–32)
Calcium: 8.5 mg/dL (ref 8.4–10.5)
Calcium: 8.5 mg/dL (ref 8.4–10.5)
Chloride: 105 mEq/L (ref 96–112)
Creatinine, Ser: 0.67 mg/dL (ref 0.4–1.2)
GFR calc Af Amer: >60 mL/min (ref >60)
GFR calc non Af Amer: >60 mL/min (ref >60)
Glucose, Bld: 122 mg/dL — ABNORMAL HIGH (ref 70–99)
Glucose, Bld: 131 mg/dL — ABNORMAL HIGH (ref 70–99)
Potassium: 3.3 mEq/L — ABNORMAL LOW (ref 3.5–5.1)
Sodium: 139 mEq/L (ref 135–145)

## 2010-07-13 LAB — URINALYSIS, ROUTINE W REFLEX MICROSCOPIC
Ketones, ur: NEGATIVE mg/dL
Nitrite: NEGATIVE
Specific Gravity, Urine: 1.007 (ref 1.005–1.030)
pH: 6.5 (ref 5.0–8.0)

## 2010-07-13 LAB — DIFFERENTIAL
Basophils Absolute: 0 10*3/uL (ref 0.0–0.1)
Eosinophils Relative: 2 % (ref 0–5)
Lymphocytes Relative: 33 % (ref 12–46)
Lymphs Abs: 1.7 10*3/uL (ref 0.7–4.0)
Neutro Abs: 3.1 10*3/uL (ref 1.7–7.7)
Neutrophils Relative %: 59 % (ref 43–77)

## 2010-07-13 LAB — POCT CARDIAC MARKERS
CKMB, poc: <1 ng/mL — ABNORMAL LOW (ref 1.0–8.0)
CKMB, poc: <1 ng/mL — ABNORMAL LOW (ref 1.0–8.0)
Troponin i, poc: <0.05 ng/mL (ref 0.00–0.09)

## 2010-07-13 LAB — POCT I-STAT, CHEM 8
BUN: 11 mg/dL (ref 6–23)
Chloride: 106 mEq/L (ref 96–112)
Glucose, Bld: 137 mg/dL — ABNORMAL HIGH (ref 70–99)
HCT: 38 % (ref 36.0–46.0)
Potassium: 3.6 mEq/L (ref 3.5–5.1)

## 2010-07-13 LAB — BRAIN NATRIURETIC PEPTIDE
Pro B Natriuretic peptide (BNP): 87 pg/mL (ref 0.0–100.0)
Pro B Natriuretic peptide (BNP): <30 pg/mL (ref 0.0–100.0)

## 2010-08-19 NOTE — Discharge Summary (Signed)
[name], [name]:  [id number]   MEDICAL RECORD NO.:  [id number]          PATIENT TYPE:  INP   LOCATION:  3705                         FACILITY:  [facility]   PHYSICIAN:  Theodosia Paling, MD    DATE OF BIRTH:  Jul 10, 1965   DATE OF ADMISSION:  10/28/2008  DATE OF DISCHARGE:  10/30/2008                               DISCHARGE SUMMARY   PRIMARY CARE PHYSICIAN:  Tracey Harries, MD.   ADMITTING HISTORY:  Please refer to the excellent admission note  dictated by Dr. Virgie Dad and the history of present illness.   DISCHARGE DIAGNOSIS:  Anaphylactic reaction to the wasp bite presented  in the form of wheezing, shortness of breath, hypertension, and rash.  Dysphagia likely due to edema from allergic reaction.   SECONDARY DIAGNOSES:  1. History of hypertension.  2. History of hysterectomy from fibroids, since then on hormone      replacement.    Discharge Medications  To be determined by discharging physician.   HOSPITAL COURSE:  Following issues were addressed during the  hospitalization:   1. Anaphylactic reaction to insect bite with low blood pressure and      bronchospasm.  The patient received IV fluids in the hospital, Solu-Medrol, as well as  epinephrine in the ER.  The patient is currently asymptomatic.  She is  not actively wheezing.  She will complete the tapering course of  prednisone in next few days.   2.Dysphagia  Patient has new onset dysphagia, she could not swallow solid food  yesturday and this morning as well, Speech has recommended clear liquid  diet for now. Most likely its due to edema from allergic reaction,  however is no improvement by tomorrow, GI can be involved for barium/  scope evaluation.   1. History of hypertension.  The patient had low blood pressure in the ER.  Initially metoprolol home  dose was held because of low blood pressure and active wheezing.  The  patient will resume the home dose of metoprolol on discharge.   She will  follow up with the primary care physician, Dr. Tracey Harries in 1 week  time for further evaluation.  1. Status post hysterectomy.  Estradiol was continued.   CONSULTATION PERFORMED:  None.   PROCEDURE PERFORMED:  None.   IMAGING PERFORMED:  Chest x-ray on October 28, 2008, showing  hypoventilation with pulmonary vascular congestion.   DISPOSITION:  Dysphagia work up in process.  Time spent 25 mts      Theodosia Paling, MD  Electronically Signed     NP/MEDQ  D:  10/29/2008  T:  10/30/2008  Job:  [id number]   cc:   Tracey Harries, M.D.

## 2010-08-19 NOTE — Op Note (Signed)
NAMEFLORENCIA, [name]:  [id number]   MEDICAL RECORD NO.:  [id number]          PATIENT TYPE:  INP   LOCATION:  1530                         FACILITY:  [facility]   PHYSICIAN:  Ardeth Sportsman, MD     DATE OF BIRTH:  January 18, 1966   DATE OF PROCEDURE:  04/04/2007  DATE OF DISCHARGE:                               OPERATIVE REPORT   PRIMARY CARE PHYSICIAN:  I do not know.   GYNECOLOGIST:  Phil D. Okey Dupre, MD   SURGEON:  Ardeth Sportsman, MD   ASSISTANT:  Currie Paris, MD   PREOPERATIVE DIAGNOSES:  1. Incarcerated, possibly strangulated, ventral hernia.  2. Postoperative day #21, status post lysis of adhesions, abdominal      hysterectomy, salpingo-oophorectomy and excision of right abdominal      wall endometrial mass.   POSTOPERATIVE DIAGNOSES:  1. Incarcerated ventral hernia with perforation.  2. Postoperative day #21, status post lysis of adhesions, abdominal      hysterectomy, salpingo-oophorectomy and excision of right abdominal      wall endometrial mass.   PROCEDURES PERFORMED:  1. Exploratory laparotomy.  2. Open lysis of adhesions x120 minutes (equals about two-thirds of      the case).  3. Ventral wall hernia repair with an 8 x 12-cm underlay, UltraThick      MTF mesh in the peritoneal plane.  4. Primary ventral hernia repair overlay.   ANESTHESIA:  General anesthesia.   ESTIMATED BLOOD LOSS:  300 mL.   SPECIMENS:  1. Incarcerated small bowel with perforation.  2. Chronic hernia sac.   DRAINS:  19-French [place] Mons drain rests in the subcutaneous tissues.   INDICATIONS:  Sheri [name] is a 45 year old obese female who had an  abdominal wall mass and menometrorrhagia and underwent abdominal  hysterectomy, salpingo-oophorectomy, lysis of adhesions and excision of  abdominal wall mass on the right abdominal wall and taking part of the  fascia with primary closure.  Postoperatively she struggled with an  incisional mass and abdominal pain, intermittent  nausea and vomiting.  She underwent evaluation.  Initially it did not look like it had a  hernia and had aspirations done of this.  She came to the ER again last  night with nausea and vomiting and the mass.  She had a CT scan which  showed an incisional hernia.  There was no definite evidence of bowel  obstruction through this area; however, she was exquisitely tender with  peritoneal signs and she had had two aspirations within the mass itself  concerning for possible microperforation versus strangulation.  The  pathophysiology of incisional hernia and incarceration was discussed.  After discussion, a recommendation was made for exploration of the  abdomen with possible bowel resection and hernia repair using biologic  mesh.   Risks such as stroke, heart attack, deep venous thrombosis, pulmonary  embolism and death were discussed.  Risks such as bleeding, need for  transfusion, hematoma, seroma, wound infection, abscess, injury to other  organs, hernia recurrence, bowel obstruction, prolonged hospital stay,  prolonged pain and other risks were discussed.  Questions  were answered  and she and her husband agreed to proceed.   OPERATIVE FINDINGS:  She had a midjejunal loop incarcerated within a  very chronically inflamed hernia sac.  There was an area of severe  ischemia and punctate perforation but no major contamination.  She had  moderate anterior abdominal adhesions, especially down into the pelvis  and anterior abdominal wall.   DESCRIPTION OF PROCEDURE:  Informed consent was confirmed.  The patient  was already on IV ciprofloxacin and Flagyl and given her severe  PENICILLIN allergy, I just added vancomycin on-call to the OR.  She had  sequential compression devices active during the entire case.  She  underwent general anesthesia without any difficulty.  She had a Foley  catheter sterilely placed.  Her abdomen was prepped and draped in a  sterile fashion.   Entry was gained to  the abdomen through a prior infraumbilical midline  incision.  We immediately encountered some very chronically inflamed  subcutaneous fat with necrosis.  I ended up trying to shell around the  mass and I identified some healthy rectus abdominis fascia to the left  of midline.  I came around cephalad up near the umbilicus and entered  into a pocket with succus encountered.  I freed around the other side  and was able to get into the hernia sac and open it up.  A loop of  inflamed, thickened small bowel that was not definitely necrotic was  noted, although there was a tight stricture near the umbilicus repair,  and this was the source of the probable perforation versus tear.  With  time I was able to get into on the right side of the sac, I was able to  get into the peritoneal cavity.   Extensive sharp dissection was done circumferentially to free adhesions  around the rim of the hernia.  Also dissection was done to free the  omentum and falciform ligament off attachments.  The small bowel was  freed off the anterior abdominal wall.  Interloop adhesions were freed  off.  Further dissection was done down to help free small loops off the  sigmoid colon and rectum and pelvis.  I ultimately was able to free, in  doing this was noted that the chronically inflamed loop of small bowel  was still rather inflamed.   We decided to do a small bowel resection of this region and therefore a  side-to-side stapled resection was done, removing about 15 cm of  inflamed bowel with perforation within it.  The staple defect was closed  using a TA/60 stapler.  The mesenteric defect was closed using  interrupted silk stitches.  Careful inspection was made and two small  bowel serosal tears were easily controlled using interrupted silk  stitches.  A transverse colon serosal tear was controlled using  interrupted stitches as well.  Over 4 L of irrigation was done with  clear return, no evidence of bleeding or  injury to any organs.  The  small bowel was allowed to return back to the abdomen.  The transverse  mesocolon and what remained of the greater omentum was allowed to help  cover over the defect.   Because of the fascial wall defect, I was worried that I would not be  able to do a primary closure without significant tension.  Because there  was a small bowel resection and probable contamination and perforation,  I did not feel a permanent mesh was appropriate and therefore I elected  to use absorbable mesh and therefore used an MTF UltraThick mesh.  An 8  x 12-cm specimen was chosen.  It was tacked underneath the peritoneum  using interrupted stitches x10, having about 2-cm overlap  circumferentially using about 8 x 12 cm total.  Then the fascia was  closed vertically using running #1 looped PDS to good result to close  fascia on top of the mesh.  The subcutaneous tissue was irrigated  copiously.  The hernia sac was debrided away as well as some excess  tissue.  A 19 [place] Blake drain was placed through a left lateral wall  stab incision and secured using nylon stitch.  It was used to help close  the cavity down.  Because there was not frank necrosis, I decided to go  ahead and close the staples over the drain after 2 L of copious  irrigation was done and hemostasis was assured.  The remnant of the  umbilicus stalk had to be debrided somewhat since there was a chronic  granulation tack and this was the source of a lot of the inflammation.  There was a lot of inflammation and perforation was at this site but  what partial umbilicus could be saved was tacked back down to the  fascia.  Skin was closed using staples.  Antibiotic ointment, sterile  gauze and dressing was applied.  The patient was extubated and sent to  the recovery room in stable condition.   I am about to explain the operative findings to the patient's family,  specifically her husband.      Ardeth Sportsman, MD   Electronically Signed     SCG/MEDQ  D:  04/04/2007  T:  04/05/2007  Job:  [id number]

## 2010-08-19 NOTE — Discharge Summary (Signed)
[name], [name]                 ACCOUNT NO.:  [id number]   MEDICAL RECORD NO.:  [id number]          PATIENT TYPE:  INP   LOCATION:  1525                         [facility]ACILITY:  [facility]   PHYSICIAN:  Phil D. Okey Dupre, M.D.     DATE OF BIRTH:  1965/12/24   DATE OF ADMISSION:  03/14/2007  DATE OF DISCHARGE:                               DISCHARGE SUMMARY   The patient, a 45 year old African American female, was admitted with a  history of severe incapacitating abdominal pain and probable pelvic  adhesions with an abdominal wall mass.  The patient underwent  exploratory laparotomy, excision of abdominal wall mass, lysis of pelvic  adhesions, total abdominal hysterectomy, and bilateral salpingo-  oophorectomy on the day of admission.  During her postoperative course  she has been completely afebrile, stable hemoglobin with a hemoglobin  9.5 and a hematocrit 31.  The patient is not passing any flatus yet but  has active bowel sounds and a soft abdomen.  She had what looked like a  little elevation in the abdominal wall near the incision that might have  been a hematoma or hernia.  The day prior to discharge ultrasound was  obtained and it looks like she may have a small seroma in that area,  although the day of discharge the abdomen is soft, flat, that to little  bosselation seems to have resolved.  The wound is clean and the patient  has active bowel sounds, voiding well.  No CVA tenderness.  Her lungs  are clear to auscultation and percussion.  Heart:  No murmur, normal  sinus rhythm.  Extremities were negative.   The patient will return to the maternity admissions office in 4 days for  staple removal, which is being delayed because of the vertical incision,  and I will see her at that point.  She has been given instructions as to  activity, followup, and has especially been counseled about heavy  lifting and stairs, and not to drive until she feels comfortable.   The pathology, interestingly  enough, revealed that the abdominal mass  was endometriosis.  Her uterus also was plastered against the anterior  abdominal wall secondary to previous cesarean sections, so my impression  is that this patient should be much more comfortable once healing has  occurred.   DISCHARGE DIAGNOSIS:  Chronic pelvic pain, satisfactory progress  following exploratory laparotomy, excision of abdominal wall mass, and  total abdominal hysterectomy, bilateral salpingo-oophorectomy.   The patient is being discharged on Slow Fe and Percocet for pain, and on  estradiol so that she does not go through a surgical menopause symptoms.      Phil D. Okey Dupre, M.D.  Electronically Signed     PDR/MEDQ  D:  03/17/2007  T:  03/17/2007  Job:  [id number]

## 2010-08-19 NOTE — Cardiovascular Report (Signed)
NAMEJAYCEE, MCKELLIPS NO.:  [id number]   MEDICAL RECORD NO.:  [id number]          PATIENT TYPE:  WOC   LOCATION:  WOC                          FACILITY:  [facility]   PHYSICIAN:  Colleen Can. Deborah Chalk, M.D.DATE OF BIRTH:  1965/05/25   DATE OF PROCEDURE:  11/03/2006  DATE OF DISCHARGE:                            CARDIAC CATHETERIZATION   PROCEDURE:  Left heart catheterization with left ventricular coronary  angiography and left ventricular angiography.   SITE OF ENTRY:  Percutaneous right femoral artery with Angio-Seal.   CATHETERS:  6 French 3.5 left coronary catheter, 6 French 3.5 right  coronary catheter, 6 French pigtail ventriculographic catheter.   CONTRAST MATERIAL:  Omnipaque.   MEDICATIONS GIVEN TO PRIOR PROCEDURE:  Valium 10 mg p.o.   MEDICATIONS GIVEN DURING PROCEDURE:  Versed 6 mg IV.   COMMENTS:  Patient tolerated the procedure well.   HEMODYNAMIC DATA:  The aorta pressure was 119/80.  LV was 124/11-16.  There was no aortic valve gradient noted on pullback.   ANGIOGRAPHIC DATA:  The left ventricular angiogram was performed in RAO  position.  There were PVCs noted during the left ventricular angiogram.  The regional wall motion and global ejection fraction appear to be  normal.  Global ejection fraction estimated to be 55-60%.   Coronary Arteries.  The coronary arteries arise and distribute normally.  1. Left main coronary artery is normal.  2. Left anterior descending is normal.  3. Intermediate coronary artery is a very large bifurcating vessel.      It is normal.  4. Left circumflex is a smaller vessel along the AV group.  It is      normal.  5. Right coronary artery is a small dominant vessel.  It is normal.   OVERALL IMPRESSION:  1. Normal left ventricular function.  2. Normal coronary arteries.  3. Angio-Seal was performed.  She was given vancomycin in the cath      lab.  It is interesting to note that she did have elevated      troponins  with negative CK-MB and normal coronary arteries at this      point in time.      Colleen Can. Deborah Chalk, M.D.  Electronically Signed     SNT/MEDQ  D:  11/03/2006  T:  11/04/2006  Job:  [id number]

## 2010-08-19 NOTE — Discharge Summary (Signed)
NAMEMATILYN, [name]                 ACCOUNT NO.:  [id number]   MEDICAL RECORD NO.:  [id number]          PATIENT TYPE:  INP   LOCATION:  6708                         FACILITY:  [facility]   PHYSICIAN:  Ruthy Dick, MD    DATE OF BIRTH:  [birth year]   DATE OF ADMISSION:  12/02/2008  DATE OF DISCHARGE:  12/04/2008                               DISCHARGE SUMMARY   ADDENDUM   The patient did not go home yesterday because she developed nausea and  some vomiting and was unable to keep down meals and because of this she  was treated in the hospital to get more stable.  Today, she is much  better.  So, date of admission for this patient is December 02, 2008, and  date of discharge is December 04, 2008.  Please refer to the  aforementioned discharge summary by the same author for final discharge  diagnosis, hospital course, discharge medications, and followup plan.  In addition to the discharge medication in the aforementioned document,  the patient will also go home on Phenergan 12.5 mg p.o. q.6 h. p.r.n.  for nausea and vomiting.  Vitals are stable today.  Temperature 98.0,  pulse 81, respirations 20, blood pressure 110/72, saturating 98% on room  air.  Chest is clear to auscultation bilaterally.  Abdomen is soft and  nontender.  Extremities, no clubbing, no cyanosis, no edema.  Cardiovascular, first and second heart sounds only.  Central nervous  system, nonfocal.      Ruthy Dick, MD  Electronically Signed     GU/MEDQ  D:  12/04/2008  T:  12/05/2008  Job:  [id number]   cc:   Dr. Elpidio Galea

## 2010-08-19 NOTE — Group Therapy Note (Signed)
NAMESAMREET, EDENFIELD NO.:  [id number]   MEDICAL RECORD NO.:  [id number]          PATIENT TYPE:  WOC   LOCATION:  [facility]                   FACILITY:  [facility]   PHYSICIAN:  Argentina Donovan, MD        DATE OF BIRTH:  June 06, 1965   DATE OF SERVICE:  12/17/2006                                  CLINIC NOTE   DICTATION BY:  Alanda Amass, dictating for Bettye Boeck. Rose, MD.   CHIEF COMPLAINT:  Abdominal pain and abnormal bleeding.   HISTORY OF PRESENT ILLNESS:  The patient is a 45 year old, African-  American female, who is complaining of abdominal pain and abnormal  bleeding and nausea.  In May one day, she started having heavy bleeding  and pain in her abdomen and back.  She went to the MAU for evaluation  and was sent home.  She had an ultrasound done, which showed a thickened  lining of her uterus.  Pap was within normal limits.  She said she was  also having pain in the lower abdomen at her C-section scar.  She has  had four prior C-sections.  The patient stays nauseous.  Has a history  of a bilateral tubal ligation.  A pregnancy test done during this time  was negative.  Also complaining of some diarrhea, early satiety, urinary  problems, feels like the baby is sitting on her bladder.  She was given  Depo-Provera a couple of weeks ago to try to control her bleeding;  however, this did not really help and gave her a discharge.  She denies  any itching or burning in the vagina.  Also has had pain on intercourse  since May and has found it very hard to sleep.  She was given a  prescription of Vicodin, which made her sleepy.  Now she has run out and  is only using Tylenol and ibuprofen.  It sounds like they have tried  everything.  A UA was negative, gonorrhea and Chlamydia negative, a wet  prep negative, a CMET and CBC were normal, her last hemoglobin was  around 12.3 and it has been stable.  History of abnormal bleeding in  May.  It started the 4th or 5th of May, bled  six days, very heavy.  In  June, it started on the 16th through the 27th.  In July, the bleeding  was from the 17th through the 23rd.  In August, bleeding was from the  16th and lasted six days.  In September, she started bleeding on the 4th  of September and it lasted two days.  In April, she said her periods  were completely normal and completely regular.  Usually they would last  four days and be every month.  Also has tried Nexium for her pain, which  did not seem to work.  She came again to the MAU on the 9th of  September, and they advised her to come back here for an endometrial  biopsy and any other procedures necessary so patient is back here for  further evaluation.  It sounds like during her time with this  problem,  she even had a cardiac catheterization to rule out chest pain and  coronary artery disease.   PAST MEDICAL HISTORY:  Four cesarean sections.  She has had vertical  incisions, but she says that transverse incisions on her uterus, has  been pregnant five times and has six living children, denies any  miscarriages.   PAST SURGICAL HISTORY:  1. Four C-sections.  2. A BTL in 1989.   FAMILY HISTORY:  Diabetes in her dad, high blood pressure in her  grandmother and her sister, grandmother had uterine and cervical cancer  in her 30s, mother had uterine and cervical cancer in her 30s and ended  up dying of this.   MEDICATIONS:  The patient is currently taking Tylenol and ibuprofen for  her pain.   Ultrasound done on Aug 06, 2006, showed homogenous endometrial  thickening that is nonspecific.  Also had a CT of the abdomen and  pelvis.  This was done on August 19th.  Both were negative for any acute  process.   PHYSICAL EXAMINATION:  GENERAL:  The patient is alert but, definitely in  pain when she moves.  HEART:  Regular rate and rhythm without murmurs, rubs, or gallops.  LUNGS:  Clear to auscultation bilaterally.  ABDOMEN:  Soft and nondistended but very tender to  palpation in her  lower quadrant.  She has a vertical scar from her umbilicus down to her  pubic hairline, which is very far indented, and she has a lot of pain  around this area.  VAGINAL EXAM:  Her labia are completely normal though she has incredible  pain on penetration with the speculum.  No abnormal discharge noted.  Cervix was very difficult to visualize though looks normal from what I  could see and was anterior.  Due to her extreme pain on exam, we were  unable to do the endometrial biopsy today.   ASSESSMENT AND PLAN:  The patient is a 45 year old, African-American  female with abnormal heavy bleeding and abdominal pain.  The patient was  discussed and seen by Dr. Okey Dupre, who feels like part of her problem might  be adhesions from her cesarean sections.  Due to the inability to  examine her thoroughly and do the biopsy here in the clinic, she will  need to be sedated and put under anesthesia to have the exam done.  She  will scheduled for surgery for her chronic pelvic and abdominal pain and  menorrhagia and is to have a dilatation and curettage with endometrial  biopsy under anesthesia.  After this time, we will determine what the  next steps of action will be for her.  Also a possibility to include  endometritis.  Also given the patient's family history of endometrial  and cervical cancer, we need to definitely rule out this in this  patient.  Also might need to consider adhesions causing bowel  obstruction temporarily given her diarrhea, early satiety, and abdominal  pain.     ______________________________  Argentina Donovan, MD    ______________________________  Argentina Donovan, MD    PR/MEDQ  D:  12/17/2006  T:  12/18/2006  Job:  [id number]

## 2010-08-19 NOTE — H&P (Signed)
[name], ROSEMAN NO.:  [id number]   MEDICAL RECORD NO.:  [id number]          PATIENT TYPE:  INP   LOCATION:  2909                         FACILITY:  [facility]   PHYSICIAN:  Audery Amel, MD    DATE OF BIRTH:  Feb 09, 1966   DATE OF ADMISSION:  11/02/2006  DATE OF DISCHARGE:                              HISTORY & PHYSICAL   PRIMARY CARE PHYSICIAN:  Renaye Rakers, M.D.   CHIEF COMPLAINT:  Chest pain and headache.   HISTORY OF PRESENT ILLNESS:  This patient is 45 year old African  American female with no significant past medical history who presents to  the [facility] emergency department with the chief complaint  of chest pain.  The patient states that her symptoms began yesterday  evening.  She accidentally ate an onion to which she has food allergy  and subsequently developed heartburn symptoms.  At this point she was  not experiencing any chest pain.  The following morning she awoke and  had a severe headache.  In addition, she noted she had significant  substernal chest pain.  She characterizes this pain as sharp and  nonradiating.  The pain is made worse with palpitations and deep  inspiration.  In addition she notes that symptoms seemed to improve when  she lies on her right side while propping up on pillows.  She does say  that she has been experiencing some discomfort for the last six months.  However, has not sought out medical care or evaluation.  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  She is able to ambulate, however, and denies  any exertional type symptoms.  She denies any orthopnea, PND, or lower  extremity swelling suggestive of heart failure.  She denies any recent  upper respiratory illness. However, her husband has recently contracted  a cold this week.  She denies any symptoms of a URI.  In addition, she  noted some left-sided tingling and there was some  concern for possible  neurologic event.  She did undergo a head CT scan while at [facility] which was interpreted as negative for any acute intracranial  process.  Her EKG revealed normal sinus rhythm with no evidence of acute  injury or ischemia.  Her cardiac __________ markers were notable in that  her troponin was elevated at 0.5.  However, her CK-MB was negative.  She  was transferred to the [facility] CCU for ongoing evaluation and  management.  On arrival, the patient was chest pain free and in no acute  distress.  She was hemodynamically stable and otherwise without  complaints.   PAST MEDICAL HISTORY:  1. Dysmenorrhea.  2. History of childhood asthma.  3. Lower extremity injury resulting in disability.  4. Status post four cesarean sections.  5. Gravida 6, para 6.   CURRENT MEDICATIONS:  None.   ALLERGIES:  PENICILLIN, BANANAS, ONIONS.   SOCIAL HISTORY:  The patient lives locally in [place] with her  husband and six children.  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  She does have a smoking habit which is somewhat  difficult to quantify.  However, recently she states that she smokes  approximately three cigarettes daily when her chest does not hurt.  She  endorses occasional alcohol use. However, denies any illicit substances.   FAMILY HISTORY:  There is a general history of hypertension, diabetes,  and hyperlipidemia.  In addition there is a history of breast cancer and  endometrial cancer.  Her mother died from lung cancer, and her father  had end-stage renal disease presumably from diabetes.  She does have an  aunt and a grandmother who had coronary artery disease.  Otherwise no  one has had a CABG or PCI.   REVIEW OF SYSTEMS:  As per HPI.  Otherwise complete review of systems is  negative.   PHYSICAL EXAMINATION:  VITAL SIGNS:  Blood pressure 136/73, heart rate   82, oxygen saturation 100% on room air.  GENERAL:  The patient is alert and oriented x3.  No acute distress.  Pleasantly conversant.  HEENT:  Normocephalic, atraumatic.  EOMI.  PERRL.  Nose patent.  Oropharynx clear without erythema or exudate.  NECK:  Supple.  Full range of motion.  No JVD.  There is no palpable  thyromegaly.  Carotid upstrokes are equal and symmetric bilaterally with  no audible bruits.  LYMPH NODES:  None.  CHEST:  Clear to auscultation bilaterally.  CARDIOVASCULAR:  Normal S1 and S2 with no audible murmurs, rubs, or  gallops.  PMI is not displaced in the left midclavicular line.  There is  palpable discomfort over the xyphoid process.  Her peripheral pulses 2+  and symmetric bilaterally.  ABDOMEN:  Soft, nontender, nondistended.  Positive bowel sounds.  No  hepatosplenomegaly.  EXTREMITIES:  No clubbing, cyanosis, or edema.  There is no evidence of  ulceration or inflammation.  NEUROLOGIC:  Grossly nonfocal.   LABORATORY DATA:  EKG reveals normal sinus rhythm with no evidence of  acute injury or ischemia.  Her head CT scan by report revealed no acute  intracranial process.   Chest x-ray was performed at [facility].  However, I do not  have the results of this test.   Sodium 135, potassium 3.8, chloride 101, CO2 24, BUN 13, creatinine 0.8,  glucose 88.  White count 10.5.  Hematocrit 38.9, platelets 477,000.  CK-  MB 1.1, troponin-I 0.45, D-dimer 0.42.  Urinalysis benign.   IMPRESSION:  Acute coronary syndrome with positive troponin.   PLAN:  Based on [name]'s recollection of events, they are somewhat  unusual for an acute coronary syndrome although she has been  experiencing some chest pain over the last six months and it is possible  that we are seeing the downward trend of her troponin.  There is no  evidence of acute injury or ischemia on an EKG.  She is currently chest  pain free.  The description of her symptoms are somewhat consistent  with  that of pericarditis and although she denies personal history of upper  respiratory tract infection, her husband has been sick this week.  I did  not appreciate any rub on examination.  However, she does have palpable  discomfort which is worse with deep inspiration.  We will continue her  unfractionated heparin drip.  She has been treated with aspirin and we  will continue with this therapy daily.  We plan  to check a transthoracic  echocardiogram in the a.m. to assess her left ventricular function and  structure.  We will cycle her cardiac biomarkers to assess for ongoing  myocardial damage.  Cardiac catheterization may be warranted to further  investigate her elevated troponin.  Consideration was given to the  possibility of a pulmonary embolism.  However, D-dimer is negative.  She  has no findings in her bilateral  extremities suggestive of a DVT.  In addition, her current oxygen  saturation is 100%. She is experiencing no shortness of breath or  tachycardia.  We will check her fasting lipid profile in the a.m. for  additional risk stratification.      Audery Amel, MD  Electronically Signed     SHG/MEDQ  D:  11/03/2006  T:  11/03/2006  Job:  [id number]

## 2010-08-19 NOTE — H&P (Signed)
NAMENORMA, MONTEMURRO                 ACCOUNT NO.:  [id number]   MEDICAL RECORD NO.:  [id number]          PATIENT TYPE:  WOC   LOCATION:  WOC                          FACILITY:  [facility]   PHYSICIAN:  Phil D. Okey Dupre, M.D.     DATE OF BIRTH:  11-20-1965   DATE OF ADMISSION:  01/13/2007  DATE OF DISCHARGE:  01/13/2007                              HISTORY & PHYSICAL   SURGERY:  Scheduled at [facility]   DICTATION ENDS HERE.      Phil D. Okey Dupre, M.D.  Electronically Signed     PDR/MEDQ  D:  03/07/2007  T:  03/07/2007  Job:  [id number]

## 2010-08-19 NOTE — Consult Note (Signed)
NAMEELLAR, HAKALA                 ACCOUNT NO.:  [id number]   MEDICAL RECORD NO.:  [id number]          PATIENT TYPE:  AMB   LOCATION:  DAY                          FACILITY:  [facility]   PHYSICIAN:  Ardeth Sportsman, MD     DATE OF BIRTH:  12-Jan-1966   DATE OF CONSULTATION:  DATE OF DISCHARGE:                                 CONSULTATION   REQUESTING PHYSICIAN:  Phil D. Okey Dupre, M.D.   SURGEON:  Ardeth Sportsman, M.D.   REASON FOR CONSULTATION:  Painful abdominal mass, status post-op day  #21.   HISTORY OF PRESENT ILLNESS:  Ms. Shaikh is a 45 year old obese female who  had abdominal wall mass that underwent excision combined with a  hysterectomy, salpingo-oophorectomy, and lysis of adhesions on 14 March 2007, by Dr. Okey Dupre.  Pathology came back showing endometriosis.  Postoperatively, she had a significant postoperative ileus and bounced  back after post-op day #3.  This gave her a total of 11 days with NG  tube in.  Ultimately, she recovered and was discharged on December 19th.  She bounced back to the ER on December 22nd with some nausea and  vomiting and drainage/swelling from her incision.  She was started on  oral antibiotics.  Apparently that area was aspirated with some clear  fluid.  She returned yesterday with worsening pain at the incision and  swelling and drainage.  It was aspirated last night and again this  morning.  She had a CT scan which was concerning for a hernia last  night.  Based on concerns,  Dr. Okey Dupre requested surgical consultation.  The patient has been having bowel movements and some flatus but  intermittent bouts of abdominal pain.  She will get crampy pain when she  eats.  She has been nauseated and thrown up recently.  She is very  tender in the area and it hurts to cough.  It hurts to sneeze at that  area.   PAST MEDICAL HISTORY:  1. Endometrial deposit on abdominal wall.  2. Leiomyomata of uterus.  Otherwise negative.   PAST SURGICAL HISTORY:  1. Status  post C-section x4.  2. Bilateral tubal ligation.  3. Status post exploratory laparotomy with excision of abdominal wall      tumor, lysis of adhesions, and total abdominal hysterectomy and      bilateral salpingo-oophorectomy by Dr. Abbe Amsterdam.  Assistant:      Ginger Carne, M.D.   MEDICATIONS:  She currently is on ciprofloxacin and Flagyl.  She is on  some Phenergan and Nubain.  Prior to this she is normally not on  significant medications.   ALLERGIES:  1. PENICILLIN.  2. Poor tolerance to VICODIN.   FAMILY HISTORY:  Her father has diabetes, hypertension and sister and  grandmother.  No history of inflammatory bowel disease or irritable  bowel syndrome or GI malignancy that she can recall.   REVIEW OF SYSTEMS:  Noted per HPI.  CONSTITUTIONAL:  She has had some  occasional chills but no fevers or sweats.  No recent weight gain or  weight  loss.  OPHTHALMOLOGIC/ENT:  Negative.  CARDIAC:  Negative.  VASCULAR:  Negative.  RESPIRATORY:  Negative.  GI:  As noted per HPI.  No hematemesis, hematochezia, or melena.  No sick contacts or travel  history.  GU:  No hematuria, polyuria, or dysuria, is otherwise  negative.  MUSCULOSKELETAL:  Negative.  PSYCH:  Negative.  HEME:  Negative.  LYMPH:  Negative.  ALLERGIC:  Negative.  NEUROLOGIC:  Negative.   PHYSICAL EXAMINATION:  VITAL SIGNS:  Her current temperature is 98.9, T-  max is the same, pulse 87, respirations 20, blood pressure 107/74, 98%  on room air.  GENERAL:  She is a well developed, obese female uncomfortable but not  frankly toxic.  PSYCH:  She is pleasant, interactive with pretty good insight.  No  evidence of any dementia, delirium, psychosis, or paranoia.  NEUROLOGIC:  Cranial nerves II-XII are intact.  Hand grip is 5/5 equal  and symmetrical.  No resting or tension tremors.  EYES:  Pupils are equal, round, and reactive to light.  Extraocular  movements intact.  She does wear glasses of a rather strong prescription   but sclerae are not icteric or injected.  HEENT:  She is normocephalic.  Nasopharynx and oropharynx are clear.  Mucous membranes are moist.  HEART:  Regular rate and rhythm.  No murmurs, clicks, or rubs.  No  carotid bruits.  NECK:  Supple without any masses.  Trachea is midline.  LUNGS:  Clear to auscultation bilaterally.  No wheezes, rales, or  rhonchi.  ABDOMEN:  Obese but soft on subxiphoid and bilateral flanks.  Suprapubically, she has some tenderness.  Infraumbilical incision is  mostly closed except there is a punctate opening at the inferior aspect  and suprapubic region as well as a patch about 15 x 15-cm of some kind  of nearly epithelialized tissue.  She has about a 15 x 15-cm  subcutaneous mass that is about 7-cm high.  It is somewhat warm.  It is  very exquisitely tender to light palpation.  I cannot fell if it is  significantly erythematous.  GYNECOLOGIC:  Deferred to Dr. Okey Dupre.  RECTAL:  Deferred.  EXTREMITIES:  No clubbing, cyanosis, or edema.  MUSCULOSKELETAL:  Full range of motion shoulders, elbows, wrists, as  well as hips, knees, ankles.  LYMPH:  No head, neck, axillary, groin lymphadenopathy.  SKIN:  No obvious petechiae or purpura.  No other sores or lesions.  Incision as noted above.   LABORATORY VALUES:  Her white count was 7.6 on admission without a  significant left shift yesterday afternoon.  Her wound culture from  yesterday, wound aspiration, shows Gram positive cocci in pairs and Gram  negative rods.  They are few and rare, respectively.  This morning's is  pending.  She has a comprehensive metabolic panel which is in the normal  range except for an albumin of 3.2.  A CT scan of the abdomen and pelvis  shows an infraumbilical incisional hernia of moderate size with an  obvious small bowel loop within the mass.  There is no definite  obstruction.  There is no definite small bowel wall thickening.  She  does have surrounding fluid around the area, however.   It does seem to  enhance.   ASSESSMENT:  A 45 year old female three weeks status post excision of  abdominal wall mass including fascia with primary closure and now with  incisional hernia complicated by fluid that is positive for Gram  negative rods and peritoneal signs.  PLAN:  1. Change over to IV Invanz.  2. IV fluids.  3. Make NPO.  4. Urgent exploration of the abdomen.   I discussed the case with my partner, Dr. Carolynne Edouard, who agreed given the  fact that she has had 2 aspirations into the mass in the last 24 hours  and her peritoneal signs that there is a possibility that there could be  perforation or strangulation within this mass.  The mass is not  reducible and feels incarcerated to me.  It is very tender.   Pathophysiology of herniation with strangulation and bowel death were  discussed.  Options discussed with the patient and recommendations need  for exploration of the abdomen with possible small bowel resection and  reduction of hernia with repair.  I suspect that with the fascial loss,  that she will probably need to have mesh reinforcement to help repair  the defect.  This will probably involve an absorbable mesh given the  fact that this will be a contaminated case.  She may need bowel  resection if the bowel is necrotic or perforated.  It is not an ideal  time now 3 weeks and the inflammation will probably be moderate to  significant, but I think I cannot observe this any more given her rocky  postoperative course and her physical findings.  She understands and  agrees to proceed.  Risks such as stroke, heart attack, deep vein  thrombosis, pulmonary embolism, and death were discussed.  Risks such as  bleeding of her wound, infection, abscess, open wound, hernia  recurrence, injury to other organs and other risks were discussed.  She  agrees to proceed and consent was signed.   Given the fact that this requires some gastrointestinal equipment and  absorbable mesh  that are not available at [facility], we will do  this at [facility].  They are working to make the patient a  transfer.      Ardeth Sportsman, MD  Electronically Signed     SCG/MEDQ  D:  04/04/2007  T:  04/04/2007  Job:  [id number]

## 2010-08-19 NOTE — Discharge Summary (Signed)
[name], [name]                 ACCOUNT NO.:  [id number]   MEDICAL RECORD NO.:  [id number]          PATIENT TYPE:  INP   LOCATION:  1525                         FACILITY:  [facility]   PHYSICIAN:  Phil D. Okey Dupre, M.D.     DATE OF BIRTH:  1966-02-06   DATE OF ADMISSION:  03/14/2007  DATE OF DISCHARGE:  03/25/2007                               DISCHARGE SUMMARY   ADDENDUM:  The patient who was admitted on the 8th had total abdominal  hysterectomy, bilateral salpingo-oophorectomy and removal of abdominal  wall mass was discharged on March 17, 2007.  Shortly after discharge,  the patient began vomiting.  Abdomen became more distended.  She was  sent for abdominal CT upright which revealed an ileus.  The patient had  a right abdominal wall mass which was diagnosis prior to discharge as a  probable seroma fluid collection.  The patient had remained afebrile.  Nasogastric tube was placed at that time, and the patient was followed  over the next few days.  All labs were remaining stable with the  exception of potassium which had dropped significantly and was replaced  by intravenous potassium supplementation, and three days ago, the  patient removed her nasogastric tube herself.  She had some difficulty  tolerating p.o. liquids for some time but was able then to tolerate  them.  We slowly increased her diet.  At the discharge, she has active  bowel sounds.  The abdomen is flat and soft with the exception of the  tender mass in the wall.  She has remained afebrile.  She is applying  heat to the abdomen.  I think she has seroma which we will probably  drain or will start draining spontaneously over the next few days.  At  discharge, she has a hemoglobin of 10.1 with a hematocrit of 28.  Her  CMET was normal with the exception of a low protein at 2.5, and her  white count is normal at 6.6.   Her lungs were clear to auscultation and percussion.  No murmur, normal  sinus rhythm in her heart.  Her  abdomen once again is flat, soft.  The  scar seems to be healing well with the exception of a small draining  opening at the very top around the umbilicus and at the bottom but no  active drainage, and this tender 6-cm mass to the right of the incision  in the midportion which I think is a seroma and should point and easily  be trained, I think, we are going to see her in 2 days in the maternity  admissions office.   The discharge diagnosis is satisfactory recovery post total abdominal  hysterectomy and excision of large endometrioma of the abdominal wall  with patient who had a postoperative ileus which has resolved, and the  patient has now with an abdominal wall seroma which will be followed  closely.  She is told that if draining starts to become heavy earlier to  please come in sooner.  Otherwise, she will be seen on the morning of  December 22  at 7:00 a.m. in the MAU by myself.  She is being discharged  on Premarin 1.25 mg daily for an indeterminate amount of time, Percocet  5 mg #40 two q.4h. p.r.n. for pain, Slow Fe once a day for iron  replacement.      Phil D. Okey Dupre, M.D.  Electronically Signed     PDR/MEDQ  D:  03/25/2007  T:  03/25/2007  Job:  [id number]

## 2010-08-19 NOTE — Op Note (Signed)
[name], [name]                 ACCOUNT NO.:  [id number]   MEDICAL RECORD NO.:  [id number]          PATIENT TYPE:  AMB   LOCATION:  SDC                           FACILITY:  [facility]   PHYSICIAN:  Phil D. Okey Dupre, M.D.     DATE OF BIRTH:  17-May-1965   DATE OF PROCEDURE:  12/31/2006  DATE OF DISCHARGE:                               OPERATIVE REPORT   PREOPERATIVE DIAGNOSES:  1. Dysfunctional uterine bleeding.  2. Intractable abdominal and pelvic and vaginal pain.   POSTOPERATIVE DIAGNOSES:  1. Dysfunctional uterine bleeding.  2. Intractable abdominal and pelvic and vaginal pain.  3. Severe pelvic adhesions.   SURGEON:  Dr. Okey Dupre.   ANESTHESIA:  General.   ESTIMATED BLOOD LOSS:  Minimal.   SPECIMENS TO PATHOLOGY:  Endometrial curettings.   POSTOPERATIVE CONDITION:  Satisfactory.   OPERATIVE FINDINGS:  The patient on examination had a markedly scarred  up and indented abdomen from four previous cesarean sections with a  midline vertical subumbilical scar that pulled in by the underlying  tissues so that both sides of the abdomen looked like mountains compared  to the valley in the middle.  There was also a transverse suprapubic  scar that joined the aforementioned scar that did approximate the same  plane and was thick and keloid.  Examination of external genitalia was  normal.  BUS within normal limits.  Vagina was clean, appeared pale and  slightly atrophic.  The cervix with a weighted speculum could not be  seen, so Graves speculum was placed, and the cervix was small and  nulliparous, grasped with single-tooth tenaculum, and when we tugged on  the uterus, the anterior abdominal wall was moved inward, indicating  some adhesions from the top of the uterus to the overlying tissues.  The  uterus was sounded to a depth of only 6 cm.   THE PROCEDURE WENT AS FOLLOWS:  Under satisfactory general anesthesia  with the patient in dorsal lithotomy position, the perineum and vagina  were  prepped and draped in usual sterile manner.  Bimanual pelvic  examination failed to make the uterus palpable, although it did seem  very small.  Adnexa could not be palpated.  Weighted speculum was placed  in the posterior portion of the vagina, had to be removed to place a  Graves speculum to visualize the cervix which was grasped with single-  tooth tenaculum.  Once this was done, the weighted speculum was placed  back in the vagina.  Cervix was sounded as aforementioned and dilated to  #3 Hegar dilator.  Curved curette was used to curette the endometrial  cavity, and a small amount of endometrial tissue was obtained and sent  for pathological diagnosis.  It gave the impression of atrophy.  The  tenaculum and speculum were removed from vagina, and the patient was  transferred to the recovery room in satisfactory condition, having  tolerated the procedure well.   The plan is to schedule her for exploratory laparotomy with the ureteral  stents and bowel prepped and have a general surgeon standby and also  have her  give [place] permission for possible abdominal hysterectomy with  bilateral salpingo-oophorectomy.  Also, prior to that, I am getting an  MRI of her abdomen and pelvis and the lumbar spine, because the pain  seems to be much greater than I would expect from our operative  findings.      Phil D. Okey Dupre, M.D.  Electronically Signed     PDR/MEDQ  D:  12/31/2006  T:  01/01/2007  Job:  [id number]

## 2010-08-19 NOTE — Discharge Summary (Signed)
[name], [name]:  [id number]   MEDICAL RECORD NO.:  [id number]          PATIENT TYPE:  INP   LOCATION:  3705                         FACILITY:  [facility]   PHYSICIAN:  Isidor Holts, M.D.  DATE OF BIRTH:  07/05/1965   DATE OF ADMISSION:  10/28/2008  DATE OF DISCHARGE:  11/02/2008                               DISCHARGE SUMMARY   ADDENDUM:   DISCHARGE DIAGNOSES:  Refer to interim summary dictated October 29, 2008,  by Dr. Theodosia Paling.  In addition:  1. Osteoarthritis.   Refer to above-mentioned interim summary for details of admission  history, procedures, detailed clinical course.   For the period, however, from October 30, 2008, to November 02, 2008. the  following are pertinent:  Phenomena of anaphylactic reaction, i.e.,  wheeze, rash, pruritus, had practically completely resolved by October 30, 2008, although the patient was left with residual dysphagia to solids,  which was felt secondary to angioedema possibly in the esophagus. She  continued on intravenous steroids until ENT consultation was obtained,  which was kindly provided by Dr. Jearld Fenton, who opined that the patient  appeared symptomatically to be improving, although he would consider  flexible laryngoscopy should this improvement not to be sustained. The  patient was subsequently transitioned to oral steroid taper without any  deleterious effects, and was able to gradually advance diet to soft  mechanical by November 02, 2008. On that date, because the patient still  experienced some difficulty with solids, it was elected to do a barium  swallow examination.  This was carried out, and showed no abnormality.  The patient has therefore, been reassured accordingly.  Her migraine  headaches have not proven problematic during the course of this  hospitalization.  Blood pressure was controlled with calcium channel  antagonist, as predictably, beta blocker was discontinued at the time of  presentation, because  of her anaphylactic reaction.  In addition, the  patient did complain of knee pain, particularly following a fall which  was sustained on November 01, 2008.  X-rays of both knees done at that time  showed no acute bony abnormalities.  The patient was evaluated by PT/OT  and has been recommended home health PT.  There have been fortunately,  no problems referable to migraine headaches.   DISPOSITION:  The patient was on November 02, 2008, considered clinically  stable for discharge.  She was therefore discharged accordingly.   DIET:  Heart-healthy.   ACTIVITY:  As tolerated.  Recommended to increase activity slowly,  otherwise per PT.   FOLLOWUP INSTRUCTIONS:  The patient is recommended to follow up with her  primary MD, Dr. Tracey Harries, in the coming week.  She has been  instructed to call for an appointment.   DISCHARGE MEDICATIONS:  1. Toradol 50 mg p.o. p.r.n. q.4-6 h. for migraines.  2. Estradiol 2 mg p.o. daily.  3. Norvasc 10 mg p.o. daily.  4. Pepcid 20 mg p.o. t.i.d. for 1 week only.  5. Claritin 10 mg p.o. daily for 1 week only.  6. Prednisone 30 mg p.o. daily for 2 days  from November 03, 2008, then 20      mg p.o. daily for 2 days, then 10 mg p.o. daily for 2 days, then      stop.  7. EpiPen 0.3 mg via autoinjector IM, 1 dose p.r.n. for severe      allergic reaction.  The patient has been instructed in the use of Epipen.   Note:  Metoprolol has been discontinued.   SPECIAL INSTRUCTIONS:  Home health PT has been arranged, as well as  walker, shower chair and tub bench.      Isidor Holts, M.D.  Electronically Signed     CO/MEDQ  D:  11/02/2008  T:  11/02/2008  Job:  [id number]   cc:   Tracey Harries, M.D.

## 2010-08-19 NOTE — Op Note (Signed)
NAMEAVION, PATELLA NO.:  [id number]   MEDICAL RECORD NO.:  [id number]         PATIENT TYPE:  LINP   LOCATION:  1525                         [facility]ACILITY:  [facility]   PHYSICIAN:  Ginger Carne, MD  DATE OF BIRTH:  May 01, 1965   DATE OF PROCEDURE:  03/14/2007  DATE OF DISCHARGE:                               OPERATIVE REPORT   PREOPERATIVE DIAGNOSIS:  Pelvic peritoneal uterine adhesions.   POSTOPERATIVE DIAGNOSIS:  Pelvic peritoneal uterine adhesions.   PROCEDURE:  Placement of bilateral ureteral catheterizations and  cystoscopy.   SURGEON:  Ginger Carne, MD   ASSISTANT:  None.   COMPLICATIONS:  None immediate.   ESTIMATED BLOOD LOSS:  Negligible.   SPECIMEN:  None.   OPERATIVE FINDINGS:  The urethra was normal.  Bladder demonstrated no  abnormalities on cystoscopy including the trigone, lateral walls, dome  and posterior wall.  Both ureteral orifices were identified and found to  be normal.   OPERATIVE PROCEDURE:  The patient prepped and draped in usual fashion  and placed in lithotomy position.  Betadine solution used for  antiseptic.  After adequate general anesthesia, cystoscopy with normal  saline was performed.  Both ureteral orifices were identified and were  initially catheterized with the guidewires and following this, #5-French  straight catheters were placed on either side.  The guidewires were then  removed.  The ends of both straight catheters were affixed to the inside  of the Foley catheter.  At the end of the procedure both were removed  and a Foley catheter was replaced afterwards.  The patient tolerated the  procedure well, returned post anesthesia recovery room in excellent  condition.      Ginger Carne, MD  Electronically Signed     SHB/MEDQ  D:  03/14/2007  T:  03/15/2007  Job:  [id number]

## 2010-08-19 NOTE — H&P (Signed)
[name], [name]:  [id number]   MEDICAL RECORD NO.:  [id number]          PATIENT TYPE:  INP   LOCATION:  1824                         FACILITY:  [facility]   PHYSICIAN:  Massie Maroon, MD        DATE OF BIRTH:  1965-11-24   DATE OF ADMISSION:  12/02/2008  DATE OF DISCHARGE:                              HISTORY & PHYSICAL   PRIMARY CARE PHYSICIAN:  Tracey Harries, M.D.   CHIEF COMPLAINT:  Anaphylaxis, hypotension.   HISTORY OF PRESENT ILLNESS:  A 45 year old female with a history of  allergic reaction to prior insect bite, complains of riding in the  passenger side of the car today when she with stung by a bee on her  second finger right hand.  She thinks that it was a bee.  She had her  arm outside the window when her finger hit the bee.  Her body felt itchy  and so her husband gave her an injection of epi into her thigh.  She  felt anxious and nervous and possibly slightly shortness of breath.  She  denied any chest pain.  Palpitations, nausea, vomiting, diaphoresis.  When the patient arrived in the ED she was mildly hypotensive at 104/67.  Her blood pressure dropped as low as 98/57.  She was hydrated with some  fluid and treated with IV Solu-Medrol, Benadryl, and Pepcid.  She was  also given a second dose of epi and IV Glucagon in the ED.  The patient  will be admitted for anaphylaxis secondary to bee sting as well as  hypotension.   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  2. Hypertension.  3. Migraine headaches.  4. Fibroids.   PAST SURGICAL HISTORY:  1. TAH-BSO for fibroids in 2008.  2. Hernia repair, incarcerated ventral hernia with perforation,      requiring exploratory laparotomy and open lysis of adhesions and      ventral wall hernia repair with an 8 x 12 cm underlay, ultrathick      MTF mesh in the peritoneal plain on April 04, 2007.  3. Lysis of adhesions on March 14, 2007.   SOCIAL HISTORY:  The patient does not  smoke or drink.  She is a former  smoker who quit in 2008.  She smoked one pack per day x2 years.  She is  presently on disability.  She previously worked at the post office.   FAMILY HISTORY:  Her daughter has a severe allergic reaction to insect  bites.  Her mother had lung cancer at age 33 and her father is diabetic.   ALLERGIES:  1. PENICILLIN.  2. BANANAS.  3. LATEX.  4. BEE STINGS AND WASP STINGS.  5. VICODIN.   MEDICATIONS:  1. Toradol 50 mg p.o. q.6 h p.r.n. pain.  2. Estradiol 2 mg p.o. daily.  3. Norvasc 10 mg p.o. daily.  4. EpiPen.   REVIEW OF SYSTEMS:  Slight numbness, tingling in the left hand,  otherwise negative for all 10 organ systems except for pertinent  positives stated above.  She notes that she has had this left hand  tingling in the past.  She complains of a little bit of neck pain which  is not new.  There is no change and she will follow up with her  outpatient primary care physician for this.   PHYSICAL EXAMINATION:  VITAL SIGNS:  Temperature 98.6, pulse 98,  respiratory rate 24, blood pressure 104/67, pulse ox 97% on room air.  HEENT:  Anicteric, EOMI.  No nystagmus.  Pupils 1.5 mm, symmetric,  direct, consensual, near reflexes intact.  Mucous membranes moist.  No  tongue swelling, no angioedema present,  NECK:  No stridor, no JVD, no bruit, no thyromegaly, no adenopathy.  HEART:  Regular rate and rhythm.  S1/S2, no murmurs, gallops, or rubs.  LUNGS:  Clear to auscultation bilaterally.  ABDOMEN:  Soft, obese, nontender, nondistended.  Positive bowel sounds.  EXTREMITIES:  No cyanosis, clubbing, slight swelling of the second  finger.  No redness, no erythema.  It extends up towards the wrist in  terms of the swelling.  But at this time it actually looks pretty good  and there is no evidence of cellulitis.  NEUROLOGIC:  Nonfocal.  Cranial nerves II-XII intact.  Reflexes 2+,  symmetric, diffuse with downgoing toes bilaterally.  Motor strength 5/5  in  all four extremities, pinprick intact.  LYMPH NODES:  No adenopathy.   LABORATORY DATA:  WBC 9.3, hemoglobin 12.9, platelet count 393.  Sodium 138, potassium 5, chloride 104, bicarb 24, BUN 7, creatinine  0.73.  AST 24, ALT 14, alkaline phosphatase 70, total bilirubin 1.7.   Chest x-ray:  Film is lordotic, poor inspiration, no acute process.  EKG  normal sinus rhythm at 99, borderline first degree AV block, and  prolonged QTC, normal axis, no ST/T segment changes consistent with  acute ischemia, no significant change from prior EKG October 28, 2008.   ASSESSMENT/PLAN:  1. Bee sting.  2. Anaphylaxis.  3. Hypertension..  4. History of migraines.   PLAN:  Will check a cortisol level.  The patient will be placed on  telemetry.  We will check troponin-I q.8 h x3 sets.  Will write for  Benadryl 50 mg p.o. q.8 h and q.6 h p.r.n. and we will hold the  amlodipine at this time.  For DVT prophylaxis the patient will be placed  on Lovenox.   DISPOSITION:  Hopefully home tomorrow.  Will not implement any Solu-  Medrol at this time as her swelling appears very minimal if at all.  We  will continue to provide supportive care with some normal saline IV but  hopefully she will go home tomorrow.      Massie Maroon, MD  Electronically Signed     JYK/MEDQ  D:  12/02/2008  T:  12/03/2008  Job:  [id number]

## 2010-08-19 NOTE — Discharge Summary (Signed)
NAMEORCHID, GLASSBERG NO.:  [id number]   MEDICAL RECORD NO.:  [id number]          PATIENT TYPE:  INP   LOCATION:  1530                         FACILITY:  [facility]   PHYSICIAN:  Ardeth Sportsman, MD     DATE OF BIRTH:  October 31, 1965   DATE OF ADMISSION:  04/02/2008  DATE OF DISCHARGE:  04/18/2007                               DISCHARGE SUMMARY   GYNECOLOGIST:  Phil D. Okey Dupre, M.D.   SURGEON:  Ardeth Sportsman, M.D.   DISCHARGING DIAGNOSIS:  Incarcerated perforated ventral hernia.   PRIMARY PROCEDURE:  Lysis of adhesions, small bowel resection and repair  of incarcerated ventral hernia on 04/04/2007.   FINAL DIAGNOSIS:  Incarcerated ventral hernia with perforation.   OTHER DIAGNOSES:  1. Endometrial deposit on abdominal wall.  2. Leiomyomata of uterus.  3. Status post cesarean section x4.  4. Status post bilateral tubal ligation.  5. Status post exploratory laparotomy with excision of abdominal wall      endometrioma, lysis of adhesions and total abdominal hysterectomy,      bilateral salpingo-oophorectomy.   HOSPITAL COURSE:  Ms. Kist is a pleasant 45 year old female who had  undergone a resection by Dr. Okey Dupre on March 14, 2007 and was found to  have an endometrioma on intra-abdominal wall and underwent an abdominal  hysterectomy, salpingo-oophorectomy and resection of the abdominal mass  with primary closure.  Postoperatively, she started with a prolonged  postoperative ileus.  She developed abdominal swelling around it that  was concerning for possible postoperative seroma.  Unfortunately, she  had worsening pain and nausea and was admitted by Dr. Okey Dupre on April 03, 2007.  CT scan was performed that was concerning for a ventral wall  hernia.  Surgical consultation was made and I saw the patient the next  day and immediately operated on her.  She had some ischemic bowel within  her subcutaneous tissues.  This was resected and was able to free some  adhesions and  reduce her abdominal contents and repair her with an  underlay absorbable MTF mesh.  I left a drain in place.   Postoperatively, she had a NG tube in place and had a prolonged ileus,  but gradually NG tube output decreased and she started having  improvement in bowel function.  She required a fair amount of IV  narcotics and IV pain medication, but gradually was able to wean off  this.  She was gradually advanced on her oral diet.  She increased  physical activity.  By the time of discharge, she was tolerating liquids  and some solids with flatus and some bowel movements.  She had a  prolonged postoperative ileus and we had her on IV parenteral nutrition,  but gradually weaned her off that over the last few days of her stay.  Her intra-abdominal subcutaneous drain was removed and her staples were  removed and she was switched over to Steri-Strips.  She did bleed down  to a hemoglobin around 8, but after some diuresis and giving some  erythropoietin substituting and oral iron, her hemoglobin improved such  that by discharge, it was in the mid 8-9.  Her reticulocyte count was in  the normal range.   Basically, she has improved and she is discharged home with the  following instructions:   1. She is to return to the clinic and see me in about 2 weeks for      followup on her wound, keep the area clean and dry, call if she has      any fevers or sweats, nausea, vomiting, worsening abdominal pain or      other issues.  2. She should be physically active at least half hour a day.  Call if      she has any fevers, chills or sweats or other issues.  3. She should continue on her home medications which include iron and      Estradiol.  I do not think she needs anymore antibiotics at this      time.  4. She should return to clinic to see me in about 2 weeks for      continued improvement.  5. She should resume her home medications as noted above which      includes iron daily as well as  Estradiol per Dr. Okey Dupre.  6. Narcotic pain medication as well as ibuprofen or Aleve p.r.n. and      Tylenol p.r.n. and ice packs or heating pads p.r.n.      Ardeth Sportsman, MD  Electronically Signed     SCG/MEDQ  D:  04/18/2007  T:  04/19/2007  Job:  [id number]

## 2010-08-19 NOTE — Group Therapy Note (Signed)
[name], [name]:  [id number]   MEDICAL RECORD NO.:  [id number]          PATIENT TYPE:  WOC   LOCATION:  [facility]                   FACILITY:  [facility]   PHYSICIAN:  Argentina Donovan, MD        DATE OF BIRTH:  Jan 31, 1966   DATE OF SERVICE:  12/23/2006                                  CLINIC NOTE   REASON FOR VISIT:  The patient is a 45 year old, African American female  with four prior C-sections who has been in just a few days ago and was  scheduled for pelvic examination under anesthesia with dilatation and  curettage because of severe chronic, i.e. since May, abdominal pain,  very superficial in the lower abdomen.  The lower abdomen is markedly  distorted with her scars from her previous surgery.  Her last baby was  born in 1989, but this pain started a short-time ago.  Apparently, she  had a significant workup in the MAU and otherwise, and so far have found  no reason for the pain.  It is impossible to examine this patient  because just touching the skin in that area causes her to tear up and  wince.  Vaginal exam is also impossible.  She has had four C sections  and a BTL in 1989, and although she has had pain in that area for some  time, the pain started in May.  Her husband says she cannot stand up  since that time and I am not quite sure what is going on with this  patient.  We will try and evaluate it.  My feeling is that eventually  she will have to have that whole abdomen and the abdominal scar revised  by a general surgeon.  The patient came in today again because of an  allergy and allergic reaction to Vicodin she had been taking.  She has  had terrible itching and I have replaced that with Dilaudid 1 mg q.4 h.  until we can see her at surgery.           ______________________________  Argentina Donovan, MD     PR/MEDQ  D:  12/23/2006  T:  12/24/2006  Job:  [id number]

## 2010-08-19 NOTE — Consult Note (Signed)
NAMEPEGGYE, [name]:  [id number]   MEDICAL RECORD NO.:  [id number]          PATIENT TYPE:  EMS   LOCATION:  MAJO                         FACILITY:  [facility]   PHYSICIAN:  Lonia Blood, M.D.DATE OF BIRTH:  Jun 01, 1965   DATE OF CONSULTATION:  11/23/2006  DATE OF DISCHARGE:  11/23/2006                                 CONSULTATION   REQUESTING PHYSICIAN:  Dr. Maurice March Molpus.   REASON FOR CONSULTATION:  Chest discomfort.   HISTORY OF PRESENT ILLNESS:  [name] is a 45 year old female  with the medical history detailed below.  She was admitted to the  hospital in July 2008 with complaints of substernal and left-sided chest  tightness associated with some nausea and vomiting.  She ultimately  underwent a cardiac catheterization, which was completely normal.  At  that time, she had an extremely mildly elevated troponin.  Upon  initially returning home, the patient had no difficulty.  Then her chest  pain returned approximately 1 week ago.  The patient describes the pain  as a pressure-type sensation.  Although she is quite circumferential in  her history, it does appear that the patient's pains also bring with  them a sensation that she cannot swallow.  She reports that food often  will stick in her throat during episodes of her pain and that she  probably will vomit undigested food; this is also associated with the  sense that her throat is tightening and she cannot catch her breath.  There is apparently no significant wheezing at the time.  The patient  reports that she has had her symptoms off and on throughout the last  week.  Her appetite is not poor and at other times she is able to  swallow both liquids and solids without any difficulty.  There is no  dyspnea on exertion at times when she is not having chest discomfort.  The patient informs me that she has recently obtained a new primary care  physician in [facility] and is scheduled to follow up  with him within a  week to a week and a half.  The patient's pain is nonpleuritic.  There  is no hemoptysis, hematemesis or cough.  There has been no fevers or  chills.  There is no headache.   REVIEW OF SYSTEMS:  [name] is positive on essentially every element of  the review of systems upon which you question her.  The multiple  complaints that she registers are too extensive to detail here in this  simple consultation note.  I have considered each of the issues that she  presents to me and at the present time, none of them appear to be acute  issues that require immediate hospitalization.   PAST MEDICAL HISTORY:  1. Atypical chest pain requiring admission, July 2008, with normal      cardiac cath.  2. Dysmenorrhea.  3. History of lower extremity injury at work leading to disability      from the [employer].  4. Status post C-section x4.  5. Tobacco abuse with discontinuation within  the last month.  6. Chronic normocytic anemia with hemoglobin 11.3, July 2008.   OUTPATIENT MEDICATIONS:  Chantix.   ALLERGIES:  The patient reports allergies to PENICILLIN, BANANAS, ONION  and LATEX.   FAMILY HISTORY:  The patient's mother died of lung cancer; the patient's  father died of end-stage renal disease with diabetes.   SOCIAL HISTORY:  The patient lives in [place].  She is married.  She  has 6 healthy kids.  She is disabled from the [employer].   DATA REVIEW:  Hemoglobin is 11.3, but CBC is otherwise unremarkable.  Basic metabolic panel is normal.  Point-of-care cardiac markers are  negative x1.   Twelve-lead EKG reveals normal sinus rhythm at 73 beats per minute and  is a normal EKG with no significant change when compared to EKG obtained  in July 2008.   PHYSICAL EXAMINATION:  VITAL SIGNS:  Temperature 97.7, blood pressure  113/60, heart rate 78, respiratory rate 24, O2 SAT 100% on 2 L/min nasal  cannula.  GENERAL:  Obese female in no acute respiratory distress.   HEENT:  Normocephalic, atraumatic.  Pupils equal, round and reactive to  light and accommodation.  Extraocular muscles intact bilaterally.  OC/OP  clear.  NECK:  No JVD.  No lymphadenopathy.  LUNGS:  Clear to auscultation bilaterally without wheezes or rhonchi.  CARDIOVASCULAR:  Regular rate and rhythm without murmur, gallop or rub.  Normal S1 and S2.  ABDOMEN:  Obese, soft.  Midline scar.  Nontender and non-distended.  Bowel sounds are present.  EXTREMITIES:  No significant cyanosis, clubbing or edema in bilateral  lower extremities.  GROIN:  The patient's right groin has completely healed from her cardiac  cath with no evidence of pseudoaneurysm, ecchymosis and no auscultated  bruit.   RECOMMENDATIONS:  [name] does in fact detail multiple, multiple  complaints, the most prominent of which, however, and the one which has  led to her presentation to the ER is that of chest discomfort.  After  extensive discussion and frequent redirection, I am able to elucidate  the patient's pain as primarily a cramping-type pressure discomfort that  is located in the center of the chest and is associated with significant  dysphagia.  Specifically, the patient feels that food sticks in her  throat.  At times, she actually has to vomit the food up and reports  that it is undigested.  There is no hematemesis. The patient's chest  discomfort is not associated with exertion specifically.  She did have a  normal cardiac catheterization less than a month ago.  EKG today is  normal and point-of-care marker is negative x1.  My concern is that Ms.  Weinfeld is likely suffering with esophageal spasm.  At the present time, I  do not feel that inpatient hospitalization would offer any significant  benefit to [name].  I have discussed my concern of esophageal spasm  with her.  I will place her empirically on Cardizem CD at 180 mg a day  for a trial basis.  She is instructed to stop this medication should she   develop dizziness or lightheadedness.  She is instructed to keep her  scheduled appointment at the medical clinic in [facility] and report her  symptoms there.  She is advised that an EGD or esophageal radiographic  studies are likely to be required in the future.  She is advised to  return to the ER immediately should she develop severe chest pain that  is unrelenting,  severe shortness of breath or if she feels that her  symptoms are acutely worsening.   Thank you for your consultation on this interesting case.      Lonia Blood, M.D.  Electronically Signed     JTM/MEDQ  D:  11/23/2006  T:  11/23/2006  Job:  [id number]

## 2010-08-19 NOTE — H&P (Signed)
[name], [name]                 ACCOUNT NO.:  [id number]   MEDICAL RECORD NO.:  [id number]          PATIENT TYPE:  INP   LOCATION:  3705                         FACILITY:  [facility]   PHYSICIAN:  Virgie Dad, MD     DATE OF BIRTH:  [birth year]   DATE OF ADMISSION:  10/28/2008  DATE OF DISCHARGE:                              HISTORY & PHYSICAL   PRIMARY CARE PHYSICIAN:  Unassigned.   CHIEF COMPLAINT:  Wheezing and shortness of breath and generalized rash  and itching after bite (wasp)in the left leg   HISTORY OF PRESENT ILLNESS:  This 45 year old African American female  known hypertensive, known estrogen replacement therapy, known history of  migraine, is admitted through the emergency room for complaints of  anaphylaxis with transient hypotension and bronchospasm after she was  bit by a wasp on the left lateral leg.Today  at 2 p.m. in her yard ,she  got bit a wasp and almost immediately she had generalized itching,  shortness of breath, and rash all over the body.  She was brought by her  family to the emergency room, at which time she was found to have a  blood pressure systolic 60 with generalized rash and wheezing.  She  never had any allergic reaction to previous insect bite before.  She is  allergic to PENICILLIN, BANANAS, LATEX, and she is on beta-blocker,  metoprolol 25 mg twice a day for blood pressure.  In ED, she was given  epinephrine subcutaneous  twice, Benadryl, ranitidine, and albuterol  nebulizer for wheezing.  When seen prior to admission, the patient looks  comfortable even if her blood pressure is only 98/60.  She has residual  edema of the eyelids, no perioral edema, a few scattered wheezing, and  the bitten area showed scalloped erythema about 6 cm on the left lateral  leg.   PAST HISTORY:  She has had hysterectomy from fibroids in 2008.  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  She has been since then on estradiol 2 mg  daily.   SOCIAL HISTORY:  She works at her own [employer].  She  smoked a pack for 1-1/2 week.  She denies any alcohol.   ALLERGIES:  She is allergic to PENICILLIN, BANANAS, LATEX and now to BEE  STING and WASP STING.   CURRENT MEDICATIONS:  1. Metoprolol 25 mg twice a day.  2. Toradol 50 mg 1 tablet every 4-6 hours p.r.n. for migraine.  3. Estradiol 2 mg daily.   FAMILY HISTORY:  Her daughter has severe allergic to insect bite, mother  had lung cancer at age 25, and her father is diabetic.   REVIEW OF SYSTEMS:  Migraine, no visual complaints, other than today  where she thought her eyelids were so swollen, she could not open her  eyes, but now it has improved as she has residual periorbital edema.  HEENT:  No nasal congestion, complaints of sore throat.  RESPIRATORY:  No cough, some wheezing.  Denies any shortness of breath as compared to  she just  arrived at the emergency room.  CHEST:  No chest pain.  HEART:  Palpitations earlier but now the current heart rate is 82 per minute.  ABDOMEN:  No epigastric pain.  She thought she had some abdominal  cramping, right after the bite.  GENITOURINARY:  No discharge, status  post hysterectomy from fibroids, on estradiol 2 mg daily, has hormone  replacement.  MUSCULOSKELETAL:  Denies any aches and pains except on  arrival in ED.  She had generalized arthralagia and general muscle ache.  NEURO:  Complaints of headache, migraine, no past history of CVA.   PHYSICAL EXAMINATION:  VITAL SIGNS:  Temperature 98.6, blood pressure  98/80, sitting down 108/66, pulse 90, respiration 16, and O2 sat is 100  saturation on room air.  SKIN:  At the time of exam, the generalized rash had resolved leaving a  residual scallop, 6-cm erythematous rash over the left lateral lower  leg.  There is no stinger seen.  No other rash seen, residual  periorbital edema.  No oral edema.  NECK:  Supple.  Thyroid is normal in size.  There is no laryngeal   stridor.  LUNGS:  Lungs field residual wheezing and the patient stated improved  respiratory status after albuterol nebulizer.  HEART: 108 per minutes, S1 and S2 normal.  No S4.  No murmur.  No  pericardial rub.  ABDOMEN:  Nontender.  Bowel sounds are normoactive.  EXTREMITIES:  Erythema scallop over the insect bite, left lateral lower  leg.  No stingers are seen.  No edema.  NEUROLOGIC:  Normal.  No neuro deficits.  No paresis.  Reflexes are  normal.   ASSESSMENT AND PLAN:  This is a 45 year old African American who got bit  by a wasp.  No previous history of anaphylaxis or allergic to INSECT  BITE, is on beta-blocker therapy, got stung by a wasp and almost  immediately, developed anaphylaxis with hypertension and bronchospasm.  1. 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      Continue normal saline 200 mL per hour to improve blood pressure,      although she is tolerating Calan, 98/60.  Benadryl 50 mg p.o. q.6      h., ranitidine 150 mg b.i.d., Solu-Medrol IV 40 mg q.8 h., watch      pulse ox for any recurrence of bronchospasm.  Albuterol is ordered      q.6 h. for wheezing.  2. History of hypertension, now hypertensive.  Discontinue metoprolol,      beta-blocker is known to aggravate anaphylaxis or allergic reaction      and respiratory compromise.  3. Status post hysterectomy on estradiol therapy continue.  4. Migraine by history, continue Toradol 50 mg q.6 h. p.r.n.  5. Cardiomegaly.  Chest x-ray, we will need as an outpatient cardiac      echo to rule out LDH especially that she has a long history of      hypertension.   Prognosis;guarded  Ethics:Full Code      Virgie Dad, MD  Electronically Signed     EA/MEDQ  D:  10/28/2008  T:  10/29/2008  Job:  [id number]

## 2010-08-19 NOTE — Discharge Summary (Signed)
[name], [name]                 ACCOUNT NO.:  [id number]   MEDICAL RECORD NO.:  [id number]          PATIENT TYPE:  INP   LOCATION:  6708                         FACILITY:  [facility]   PHYSICIAN:  Ruthy Dick, MD    DATE OF BIRTH:  24-Dec-1965   DATE OF ADMISSION:  12/02/2008  DATE OF DISCHARGE:  12/03/2008                               DISCHARGE SUMMARY   REASON FOR ADMISSION:  Anaphylaxis and hypotension.   FINAL DISCHARGE DIAGNOSES:  1. Bee sting.  2. 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  3. Hypotension.  4. History of hypertension.  5. History of migraines.  6. History of multiple allergies to PENICILLIN, BANANAS, LATEX, BEE      STINGS, and VICODIN.   CONSULT DURING THIS ADMISSION:  None.   PROCEDURES DONE DURING THIS ADMISSION:  None.   BRIEF HISTORY OF PRESENTING ILLNESS AND HOSPITAL COURSE:  This is a 45-  year-old [place] American lady with the past medical history of multiple  allergies, especially to bee and wasp, and also hypertension and  migraine headaches, who came into the hospital having been bitten by bee  while she was driving.  She had an acute pain injection, but she  continued to have some nausea and some vomiting and because of this she  came to the emergency room treated with Solu-Medrol, Benadryl, and  Pepcid in the emergency room and continue with Benadryl on the floor.  Symptoms improved for her during this time.  As of now, she has no more  major complaints except for some nausea, no chest pain, no shortness of  breath, and no anaphylaxis.  Blood pressure is normalized.   PHYSICAL EXAMINATION:  VITAL SIGNS:  Today are temperature 98.0, pulse  85, respirations 20, blood pressure 114/70, and saturating 100% on room  air.  CHEST:  Clear to auscultation bilaterally.  ABDOMEN:  Soft and nontender.  EXTREMITIES:  No clubbing, no cyanosis, and no edema.  CARDIOVASCULAR SYSTEM:  First and second heart sounds only.  CENTRAL NERVOUS SYSTEM:  Nonfocal.   The patient should have an appointment with Dr. Everlene Other in 1-2 weeks, and  the patient is to call for this appointment.   The patient is to go home on the following medications:  1. Zantac 150 mg p.o. b.i.d. for 3 days.  2. Benadryl 25 mg p.o. q.8 h. for 3 days.  3. Prednisone 10 mg p.o. daily for 3 days.  4. Tramadol 50 mg q.6 h. p.Sheri.n. for headache.  5. Estradiol 2 mg daily.  6. Norvasc 10 mg daily.  7. EpiPen 0.3 mL IM daily p.Sheri.n. for anaphylaxis.   Prescription will be given for Zantac, Benadryl, and prednisone, and the  patient is to call primary care physician, Dr. Everlene Other to have her EpiPen  refilled.   TOTAL TIME USED FOR DISCHARGE:  Less than 25 minutes.      Ruthy Dick, MD  Electronically Signed     GU/MEDQ  D:  12/03/2008  T:  12/04/2008  Job:  [id number]   cc:   Dr. Everlene Other

## 2010-08-19 NOTE — Discharge Summary (Signed)
[name], [name]                 ACCOUNT NO.:  [id number]   MEDICAL RECORD NO.:  [id number]          PATIENT TYPE:  INP   LOCATION:  6531                         FACILITY:  [facility]   PHYSICIAN:  Colleen Can. Deborah Chalk, M.D.DATE OF BIRTH:  Jul 14, 1965   DATE OF ADMISSION:  11/02/2006  DATE OF DISCHARGE:  11/04/2006                               DISCHARGE SUMMARY   DISCHARGE DIAGNOSES:  1. Atypical chest pain with small rise in troponin levels, yet with      negative cardiac MBs.  She did undergo cardiac catheterization on      November 03, 2006, which showed normal coronaries and normal left      ventricular function.  2. Headache with computed tomography scan of the head showing no acute      abnormality.  She did have atrophy noted.  3. Situational stress.   HISTORY OF PRESENT ILLNESS:  The patient is a 45 year old, black female  who originally presented to [facility] with complaints of  chest pain.  She noted her symptoms began the day before admission.  She  had actually eaten an onion of which she has a food allergy.  She  subsequently developed heartburn.  The following morning, she awoke and  had severe headache.  She was also complaining of significant substernal  chest pain.  It was described as sharp and nonradiating.  It was worse  with palpitations and deep inspiration.  She noted that the symptoms  seemed to improve when she laid on her right side as well as being  propped up.  She notes that she has had some discomfort over the past 6  months, but has not sought medical care.  She continued to complain of a  headache and a CT scan in the emergency room was unremarkable except for  atrophy.  Her EKG was unremarkable.  Her troponins were slightly  elevated, yet her CK-MBs were negative.  She was subsequent transferred  to [facility] where she was pain-free.   Please see the history and physical for further patient presentation and  profile.   LABORATORY DATA AND  X-RAY FINDINGS:  During her admission, CBC showed a  hemoglobin 13, hematocrit 38, white count was 10, platelets 477.  Chemistries were all normal.  Coagulations were normal.  LFTs were  normal.  Troponins were elevated at a 0.45 to 0.51 to 0.5 to 0.73.  Cardiac MBs were negative x3.  Cholesterol levels showed a total of 136,  LDL 70, HDL 59, triglycerides 36.  Urinalysis was negative.   EKG was unremarkable.  A portable chest x-ray showed no active  cardiopulmonary process.   HOSPITAL COURSE:  The patient was taken from [facility] to [facility]  and admitted.  We proceeded on with cardiac catheterization the  following afternoon.  Those results are as noted above.  Postprocedure,  she was transferred to 6500.  Today, on November 04, 2006, she is doing  well.  She does have multiple somatic complaints and continues to ask  for narcotics on a regular basis.  At this time, we  will make plans for  her to be discharged as an outpatient with her primary care for further  evaluation.   CONDITION ON DISCHARGE:  Stable.   DIET:  Heart-healthy.   DISCHARGE MEDICATIONS:  None.   FOLLOW UP:  Follow up with Dr. Parke Simmers in 1 week.  Certainly sooner, we  will be available if problems arise from a cardiac standpoint.      Sharlee Blew, N.P.      Colleen Can. Deborah Chalk, M.D.  Electronically Signed    LC/MEDQ  D:  11/04/2006  T:  11/04/2006  Job:  [id number]   cc:   Renaye Rakers, M.D.

## 2010-08-19 NOTE — Op Note (Signed)
[name], [name]                 ACCOUNT NO.:  [id number]   MEDICAL RECORD NO.:  [id number]          PATIENT TYPE:  INP   LOCATION:  0003                         FACILITY:  [facility]   PHYSICIAN:  Phil D. Okey Dupre, M.D.     DATE OF BIRTH:  [birth year]   DATE OF PROCEDURE:  03/14/2007  DATE OF DISCHARGE:                               OPERATIVE REPORT   PROCEDURES:  1. Installation of ureteral catheters.  2. Exploratory laparotomy.  3. Excision of abdominal scar.  4. Excision of abdominal wall tumor.  5. Lysis of pelvic adhesions.  6. Total abdominal hysterectomy and bilateral salpingo-oophorectomy.   The surgeon for installation of ureteral catheters, Ginger Carne,  MD.  That procedure will be dictated separately.  For the remainder of  the procedures, surgeon Phil D. Okey Dupre, MD, assistant Ginger Carne,  MD   ANESTHESIA:  General.   SPECIMENS TO PATHOLOGY:  Anterior wall tumor; uterus, tubes and ovaries.   ESTIMATED BLOOD LOSS:  400 mL.   POSTOPERATIVE CONDITION:  Satisfactory.   OPERATIVE FINDINGS:  An extremely ugly and uncomfortable abdominal scar  with keloid formation in the midline from just below the umbilicus to  above the symphysis pubis, which joined a transverse scar that was  thinner and more malleable, giving the attempted abdomen the appearance  of a buttocks.  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  There were pelvic adhesions that attached the anterior wall of the  uterus to the anterior abdominal wall.   PROCEDURE:  Under satisfactory general anesthesia, the patient was  placed in a dorsal lithotomy position.  The vagina and perineum were  prepped in the usual sterile  manner and the procedure that will be  separately dictated by Dr. Mia Creek of insertion of the ureteral  catheters was carried out.  At the  end of this procedure a Foley  catheter was placed in the bladder.  The patient was placed in dorsal  supine position.  The abdomen was prepped and draped in the usual  sterile manner.  The previous vertical incision scar, which extended  from just below the umbilicus to above the symphysis pubis, was removed  on entry.  On getting into the peritoneal cavity, the aforementioned  abdominal wall mass was identified and excised with the scalpel.  Electronic coagulation was carried out to control bleeding.  Once this  was done, the pelvic adhesions were broken up by sharp and blunt  dissection.  The uterus was separated from the anterior abdominal wall  by sharp dissection, which freed up the uterus and ovaries and tubes so  that a Balfour self-retaining retractor could be placed in the abdomen  and the bowel packed away.  The uterus was then grasped with straight  Heaney clamps just lateral  to the uterus and across the fallopian tubes  that __________  the meso down to the round ligaments on both sides for  traction and hemostasis.  The round ligaments were then ligated with #1  chromic catgut suture ligatures, divided, the opening made in the  avascular portion of broad ligament, through which a curved Heaney clamp  was placed and was placed around the infundibulopelvic ligament, the  tissue medially divided.  The ligament was ligated with #1 chromic  catgut suture ligature.  The uterine vessels were then skeletonized,  clamped, divided and ligated with #1 chromic catgut suture ligature.  The cardinal ligaments were then divided, clamped after the bladder  pushed away from the lower uterine segment once the broad ligament had  been opened around the anterior superior portion of the cervix and the  bladder well pushed away from the cervix.  The cardinal  ligaments were  clamped, divided and ligated with #1 chromic catgut suture ligature.  The cervix was then shelled out from the apex of the vagina and removed  with a Mayo scissors.  Figure-of-eight sutures were used to close the  vaginal cuff to include the angle of the vagina.  The area was observed  for bleeding.  None was noted.  The pelvis was irrigated with normal  saline.  All tape  and instrument count was reported as correct.  The  upper abdominal viscera was explored and found to be within normal  limits.  The fascia was closed with a 0 running PDS looped suture.  The  __________  portion of the suture ran from fascia through the peritoneum  making sure that fascia, which had been markedly separated from the  anterior wall of the muscle on the right side to aid in removal of the  abdominal wall tumor.  Once we got down to midportion closure, just the  fascia was engaged in the closure with the same suture, which was run  down and completed at just above the symphysis pubis.  Closure was  complete.  As we closed, we checked to make sure that there was no bowel  or any other tissue caught into the suture beneath the fascia.  Subcutaneous bleeders were controlled with hot cautery.  The  subcutaneous tissue had to be divided to free up and loosen up the skin  on the left side, the lower portion of the incision where scarring had  occurred.  Skin staples were used for skin edge approximation.  Dry  sterile dressing was applied.  The Foley catheter and ureteral catheters  were removed and a second Foley was placed into the vagina.  The patient  had a total blood loss of approximately 400 mL.  Tape, instrument,  sponge and needle count were reported correct at the end of the  procedure and the patient was transferred to the recovery room in  satisfactory condition.      Phil D. Okey Dupre, M.D.  Electronically Signed     PDR/MEDQ  D:  03/14/2007  T:  03/14/2007  Job:  [id number]

## 2010-08-19 NOTE — H&P (Signed)
NAMEVIDALIA, SERPAS                 ACCOUNT NO.:  [id number]   MEDICAL RECORD NO.:  [id number]         PATIENT TYPE:  LINP   LOCATION:                               FACILITY:  [facility]   PHYSICIAN:  Phil D. Okey Dupre, M.D.     DATE OF BIRTH:  1965/08/09   DATE OF ADMISSION:  03/14/2007  DATE OF DISCHARGE:                              HISTORY & PHYSICAL   The date of surgery will be 7:30 a.m. on December 8   SURGEONS:  Dr. Okey Dupre and Dr. Mia Creek with standby by Manus Rudd. M.D.   CHIEF COMPLAINT:  Severe abdominal vaginal pain and, I am always  bleeding.   HISTORY OF PRESENT ILLNESS:  The patient is a 45 year old African  American female who has a history of 4 cesarean sections and a tubal  ligation last done in 1989 who within the past year has developed  extraordinary lower abdominal pain radiating into the vagina and  intractable menometrorrhagia.  The patient has a markedly distorted  abdomen from the scarring over previous surgeries, and when first seen  we were almost unable to examine the patient because of the pain.  I  scheduled her for an examination under anesthesia and a D and C because  of her bleeding, and during the D and C when traction was placed on her  cervix this brought in the lower abdomen indicating there was some  adhesions between the fascial layer in the abdomen and the fundus of the  uterus.  Impressingly enough the pain did abate to some degree after the  surgery, possibly we broke up some of the adhesions with traction, but  the patient has been virtually unable to do anything according to her  family over the past 6-8 months because of the pain.  She is also having  pain in the lower abdominal scar, has frequent nausea, and says she  feels that the baby is sitting on her bladder.  The D and C was  unremarkable in its findings, but hormone treatment with Depo-Provera  did not seem to help the bleeding.  She has not been able to have  intercourse since May, and  found it very difficult at times sleeping  because of the pain.  After trying all of the conservative methods of  treatment that we could think of we sent this patient for an abdominal  CT scan which showed irregular speculated soft tissue mass in the  subcutaneous tissue in the anterior abdomen just to right and inferior  to the umbilicus involving the right rectus muscle and the rectus  sheath.  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  They also found that the fundus of the uterus was tethered  to the anterior abdominal wall adjacent to the mass which appeared to  extend slightly through the rectus muscles to the fundus of the uterus,  so the patient is scheduled to undergo exploratory laparotomy after  ureteral stents are placed because we do expect marked amount of  adhesions.  Excision of the abdominal wall tumor, probable abdominal  hysterectomy, and bilateral salpingo-oophorectomy, and revision of her  abdominal scar.  We are having a general surgeon standby to assist [place]  with this case if needed.   PAST MEDICAL HISTORY:  The past medical history are the 4 cesarean  sections.  She has had vertical incisions on her abdomen.  Her parity is  gravida 6, para 5-0-1-5.   FAMILY HISTORY:  The patient takes no medications except for pain.   ALLERGIES:  PENICILLIN and VICODIN.   FAMILY HISTORY:  Diabetes in her dad.  High blood pressure in gm and  sister.  Grandmother and uterine and cervical cancer in her 34s and her  mother uterine cervical cancer in her 30s and ended up dying from this.   REVIEW OF SYSTEMS:  The review of systems has been relatively negative  with exception of present illness.   PHYSICAL EXAMINATION:  VITAL SIGNS:  The patient was is 4 feet, 11  inches tall, weighs 172 pounds, had a blood pressure 110/80, pulse of  77, temperature 99, respirations 14 per minute.  GENERAL: The patient is a well-developed, well-nourished [place]  American  female who walks somewhat bent over in obvious distress.  HEENT: Within normal limits. PERRLA.  NECK:  Neck is supple.  Thyroid symmetrical with no masses.  BACK:  Back is erect.  BREASTS:  Breasts are symmetrical with no dominant masses, no nipple  discharge.  HEART:  No murmur, normal sinus rhythm, no rubs or gallops.  LUNGS:  The lungs were clear to auscultation and percussion bilaterally.  ABDOMEN:  The abdomen was soft, nondistended, but very tender to  palpation especially in the lower area and markedly distorted from the  vertical scar that went down from the umbilicus to the pubic hairline,  and around the lower portion like a small apron that gave the impression  of a buttocks on her abdomen (a more clear description of what this  looked like).  GENITALIA:  External were completely normal, exquisite tenderness on  attempted examination although vagina was clean and well rugate.  Cervix  was clean, nulliparous.  RECTAL EXAM:  Her rectal exam revealed no masses.  PELVIC EXAMINATION:  The bimanual pelvic examination made it difficult  to outline the uterus, which ultrasound measured 13 cm, nor to see the  adnexa.  EXTREMITIES:  There is no edema and no varices.  NEUROLOGIC:  DTRs within normal limits.   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  We had a long discussion  today with the patient and her husband, and discussed the possible  complications as well as a reason for using ureteral catheters, and the  bowel prep that we prescribed today and eventual necessity for bowel  repair.  We have talked about infection postoperative and operative  hemorrhage, and the need possibly for transfusion as were having typing  crossed 4 units of blood.  We told her  are operating at [facility]  because we need General Surgery to standby.  We  have also told her about  anesthetic complications possible, and they were not sure that the  surgery will clear up all the problems that she has a very long term  risk of intestinal obstruction.  I can be reached by beeper at [phone].      Phil D. Okey Dupre, M.D.  Electronically Signed     PDR/MEDQ  D:  03/07/2007  T:  03/07/2007  Job:  [id number]

## 2010-08-22 NOTE — Group Therapy Note (Signed)
NAMEXANIYAH, BUCHHOLZ NO.:  [id number]   MEDICAL RECORD NO.:  [id number]          PATIENT TYPE:  WOC   LOCATION:  [facility]                   FACILITY:  [facility]   PHYSICIAN:  Ellis Parents, MD    DATE OF BIRTH:  07-02-1965   DATE OF SERVICE:                                  CLINIC NOTE   This 45 year old multiparous female was referred from [facility] because of a  history of profuse period associated with moderate cramps.  The  patient's last normal menstrual period was early January 2007.  She was  amenorrheic until about the first of May and she started bleeding, which  was associated with rather heavy flow and moderate to extreme  dysmenorrhea.  The patient was seen at [facility] and a pelvic ultrasound  revealed endometrial thickening but uniform thickening.  There were no  uterine myometrial lesions.  The pelvis was otherwise normal.  CBC was  normal.  A Pap smear was performed during this heavy, heavy bleeding  episode.  The patient states that when the bleeding stopped the pain  stopped and she is here for routine follow up.   PHYSICAL EXAMINATION:  Abdomen is soft, midline incision is present.  External genitalia are normal.  The vagina is clean.  The cervix is  small and normal sized.  The uterus is normal size and nontender.   RECOMMENDATIONS:  The patient was advised that the pelvic examination is  normal and if she has any further episodes of amenorrhea she is to come  in and we will give her progesterone withdrawal.           ______________________________  Ellis Parents, MD     SA/MEDQ  D:  08/11/2006  T:  08/11/2006  Job:  [id number]

## 2010-08-22 NOTE — Group Therapy Note (Signed)
[name], [name]:  [id number]   MEDICAL RECORD NO.:  [id number]          PATIENT TYPE:  WOC   LOCATION:  [facility]                   FACILITY:  [facility]   PHYSICIAN:  Argentina Donovan, MD        DATE OF BIRTH:  [birth year]   DATE OF SERVICE:  08/11/2006                                  CLINIC NOTE   The patient is a 45 year old African American female with four prior C-  sections whom I took to the operating room because I could not examine  her in the office because of the severe abdominal and vaginal pain.  Under examination under anesthesia, dilatation and curettage,  interestingly enough, when I grasped the cervix with a tenaculum and  pulled, the abdomen moved downward with the tension on the cervix, which  indicated to me that she had significant adhesions.  So after that. I  had ordered am MRI of her abdomen and pelvis as well as the low spine to  see whether or not there may have been a disc problem that caused her  pain.  This came back relatively negative.  However, the abdomen  demonstrated a spiculated mass to the right of midline that appears to a  rise in the abdominal musculoaperneurosis of the rectus muscle.  The  appearance was reported as a desmoid tumor, and as the radiologist  dictated, aggressively growing especially in females of this age with a  history of multiple pregnancies.  The differential diagnosis was  fibrosarcoma and reactive fibrosis. This clears up one of the problems I  had with why, with all that she had had over many, many years, she was  not symptomatic until just recently and then has been in extraordinary,  extraordinary pain since then.  Coming in today, she said that the  bottom of her abdomen feels better since the [facility] and exam under  anesthesia. Whether pulling on the cervix may have loose some adhesions  that may have given her some relief, I do not know, but her upper  abdomen in the area of the scar is still  exquisitely tender to  palpation.  I am not exactly sure who should undertake this surgery, but  I would hope that Dr. Stanford Breed and/or his group would do this.  Otherwise, I will have to arrange to have it done in conjunction with a  general surgeon, but I have called Idell Pickles, Dr. Nelwyn Salisbury  scheduling nurse and assistant, and hopefully she will get back to me.  I have told the patient that I would hope that they would accept her for  surgery and have given a renewal of her Dilaudid which she is just using  and night, according to her husband, when she wakes up from sleep, and I  have given her 2 mg, #30.   IMPRESSION:  1. Severe abdominal pain.  2. Pelvic adhesions.  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           ______________________________  Argentina Donovan, MD     PR/MEDQ  D:  01/13/2007  T:  01/14/2007  Job:  [id number]

## 2010-09-20 ENCOUNTER — Emergency Department (HOSPITAL_COMMUNITY)
Admission: EM | Admit: 2010-09-20 | Discharge: 2010-09-20 | Disposition: A | Discharge status: Home / Self Care | Payer: Medicare Other | Attending: Emergency Medicine | Admitting: Emergency Medicine

## 2010-09-20 ENCOUNTER — Emergency Department (HOSPITAL_COMMUNITY): Payer: Medicare Other

## 2010-09-20 DIAGNOSIS — M6580 Other synovitis and tenosynovitis, unspecified site: Secondary | ICD-10-CM | POA: Insufficient documentation

## 2010-09-20 DIAGNOSIS — R51 Headache: Secondary | ICD-10-CM | POA: Insufficient documentation

## 2010-09-20 DIAGNOSIS — M25569 Pain in unspecified knee: Secondary | ICD-10-CM | POA: Insufficient documentation

## 2010-09-20 DIAGNOSIS — I10 Essential (primary) hypertension: Secondary | ICD-10-CM | POA: Insufficient documentation

## 2010-09-27 ENCOUNTER — Observation Stay (HOSPITAL_COMMUNITY)
Admission: EM | Admit: 2010-09-27 | Discharge: 2010-09-28 | Disposition: A | Discharge status: Home / Self Care | Payer: Medicare Other | Attending: Emergency Medicine | Admitting: Emergency Medicine

## 2010-09-27 DIAGNOSIS — Y92009 Unspecified place in unspecified non-institutional (private) residence as the place of occurrence of the external cause: Secondary | ICD-10-CM | POA: Insufficient documentation

## 2010-09-27 DIAGNOSIS — T63461A Toxic effect of venom of wasps, accidental (unintentional), initial encounter: Secondary | ICD-10-CM | POA: Insufficient documentation

## 2010-09-27 DIAGNOSIS — R0602 Shortness of breath: Secondary | ICD-10-CM | POA: Insufficient documentation

## 2010-09-27 DIAGNOSIS — T6391XA Toxic effect of contact with unspecified venomous animal, accidental (unintentional), initial encounter: Principal | ICD-10-CM | POA: Insufficient documentation

## 2010-09-27 DIAGNOSIS — Y998 Other external cause status: Secondary | ICD-10-CM | POA: Insufficient documentation

## 2010-09-27 LAB — CBC
HCT: 43.2 % (ref 36.0–46.0)
Hemoglobin: 15 g/dL (ref 12.0–15.0)
MCH: 31.1 pg (ref 26.0–34.0)
MCHC: 34.7 g/dL (ref 30.0–36.0)

## 2010-09-27 LAB — DIFFERENTIAL
Basophils Relative: 0 % (ref 0–1)
Eosinophils Relative: 1 % (ref 0–5)
Monocytes Absolute: 0.8 10*3/uL (ref 0.1–1.0)
Monocytes Relative: 7 % (ref 3–12)
Neutro Abs: 5.2 10*3/uL (ref 1.7–7.7)

## 2010-09-27 LAB — BASIC METABOLIC PANEL
BUN: 12 mg/dL (ref 6–23)
Calcium: 9.3 mg/dL (ref 8.4–10.5)
GFR calc non Af Amer: >60 mL/min (ref >60)
Glucose, Bld: 160 mg/dL — ABNORMAL HIGH (ref 70–99)
Sodium: 139 mEq/L (ref 135–145)

## 2010-11-17 ENCOUNTER — Emergency Department (HOSPITAL_COMMUNITY)
Admission: EM | Admit: 2010-11-17 | Discharge: 2010-11-17 | Disposition: A | Discharge status: Home / Self Care | Payer: Medicare Other | Attending: Emergency Medicine | Admitting: Emergency Medicine

## 2010-11-17 ENCOUNTER — Emergency Department (HOSPITAL_COMMUNITY): Payer: Medicare Other

## 2010-11-17 DIAGNOSIS — M7989 Other specified soft tissue disorders: Secondary | ICD-10-CM | POA: Insufficient documentation

## 2010-11-17 DIAGNOSIS — I10 Essential (primary) hypertension: Secondary | ICD-10-CM | POA: Insufficient documentation

## 2010-11-17 DIAGNOSIS — M25569 Pain in unspecified knee: Secondary | ICD-10-CM | POA: Insufficient documentation

## 2010-11-17 LAB — POCT I-STAT, CHEM 8
Glucose, Bld: 140 mg/dL — ABNORMAL HIGH (ref 70–99)
HCT: 41 % (ref 36.0–46.0)
Hemoglobin: 13.9 g/dL (ref 12.0–15.0)
Potassium: 3.7 mEq/L (ref 3.5–5.1)
Sodium: 142 mEq/L (ref 135–145)

## 2010-11-17 LAB — DIFFERENTIAL
Basophils Absolute: 0 10*3/uL (ref 0.0–0.1)
Basophils Relative: 0 % (ref 0–1)
Eosinophils Relative: 1 % (ref 0–5)
Monocytes Absolute: 0.3 10*3/uL (ref 0.1–1.0)

## 2010-11-17 LAB — CBC
MCHC: 33.3 g/dL (ref 30.0–36.0)
RDW: 14.5 % (ref 11.5–15.5)

## 2010-11-17 LAB — URIC ACID: Uric Acid, Serum: 5.7 mg/dL (ref 2.4–7.0)

## 2010-12-24 LAB — CBC
HCT: 22.8 — ABNORMAL LOW
HCT: 26.4 — ABNORMAL LOW
HCT: 26.5 — ABNORMAL LOW
Hemoglobin: 8.5 — ABNORMAL LOW
Hemoglobin: 8.9 — ABNORMAL LOW
MCHC: 33.8
MCHC: 33.9
MCHC: 34.4
MCV: 89.8
MCV: 89.9
MCV: 90.5
MCV: 91.7
MCV: 92.1
Platelets: 452 — ABNORMAL HIGH
Platelets: 467 — ABNORMAL HIGH
Platelets: 531 — ABNORMAL HIGH
RBC: 2.48 — ABNORMAL LOW
RBC: 2.75 — ABNORMAL LOW
RBC: 2.82 — ABNORMAL LOW
RBC: 2.92 — ABNORMAL LOW
RBC: 2.95 — ABNORMAL LOW
RDW: 14.5
WBC: 5.4
WBC: 5.8
WBC: 6.3
WBC: 7.1

## 2010-12-24 LAB — IRON AND TIBC
Saturation Ratios: 17 — ABNORMAL LOW
UIBC: 177

## 2010-12-24 LAB — BASIC METABOLIC PANEL
BUN: 19
BUN: 4 — ABNORMAL LOW
CO2: 23
CO2: 23
Calcium: 7.8 — ABNORMAL LOW
Calcium: 8 — ABNORMAL LOW
Chloride: 108
Chloride: 114 — ABNORMAL HIGH
Creatinine, Ser: 1.23 — ABNORMAL HIGH
Creatinine, Ser: 1.51 — ABNORMAL HIGH
Creatinine, Ser: 1.79 — ABNORMAL HIGH
Creatinine, Ser: 2.06 — ABNORMAL HIGH
GFR calc Af Amer: 37 — ABNORMAL LOW
GFR calc Af Amer: 46 — ABNORMAL LOW
GFR calc Af Amer: 58 — ABNORMAL LOW
GFR calc non Af Amer: 38 — ABNORMAL LOW
GFR calc non Af Amer: 48 — ABNORMAL LOW
Glucose, Bld: 107 — ABNORMAL HIGH
Potassium: 4.1
Sodium: 136

## 2010-12-24 LAB — MAGNESIUM
Magnesium: 2.3
Magnesium: 2.3
Magnesium: 2.4

## 2010-12-24 LAB — COMPREHENSIVE METABOLIC PANEL
ALT: 10
ALT: 9
AST: 13
AST: 14
Alkaline Phosphatase: 50
Alkaline Phosphatase: 57
BUN: 8
CO2: 27
CO2: 27
CO2: 27
Calcium: 8.3 — ABNORMAL LOW
Chloride: 102
Chloride: 102
Creatinine, Ser: 1.55 — ABNORMAL HIGH
Creatinine, Ser: 1.68 — ABNORMAL HIGH
GFR calc Af Amer: 36 — ABNORMAL LOW
GFR calc non Af Amer: 30 — ABNORMAL LOW
GFR calc non Af Amer: 33 — ABNORMAL LOW
GFR calc non Af Amer: 36 — ABNORMAL LOW
Glucose, Bld: 125 — ABNORMAL HIGH
Potassium: 3.2 — ABNORMAL LOW
Potassium: 3.5
Sodium: 136
Sodium: 137
Total Bilirubin: 0.5
Total Bilirubin: 0.5
Total Protein: 6.1

## 2010-12-24 LAB — DIFFERENTIAL
Basophils Absolute: 0.1
Basophils Relative: 1
Eosinophils Relative: 6 — ABNORMAL HIGH
Lymphocytes Relative: 23
Neutro Abs: 3.8

## 2010-12-24 LAB — PHOSPHORUS
Phosphorus: 3
Phosphorus: 3.3

## 2010-12-24 LAB — CREATININE, SERUM: GFR calc Af Amer: 36 — ABNORMAL LOW

## 2010-12-24 LAB — VITAMIN B12: Vitamin B-12: 599 (ref 211–911)

## 2010-12-24 LAB — RETICULOCYTES
RBC.: 2.88 — ABNORMAL LOW
Retic Count, Absolute: 57.6
Retic Ct Pct: 1.1
Retic Ct Pct: 2

## 2010-12-24 LAB — TRIGLYCERIDES
Triglycerides: 178 — ABNORMAL HIGH
Triglycerides: 194 — ABNORMAL HIGH

## 2010-12-24 LAB — PREALBUMIN: Prealbumin: 16.7 — ABNORMAL LOW

## 2010-12-25 LAB — BASIC METABOLIC PANEL
CO2: 21
Calcium: 8.5
Creatinine, Ser: 0.93
GFR calc Af Amer: >60
GFR calc non Af Amer: >60
Glucose, Bld: 97

## 2010-12-25 LAB — URINALYSIS, ROUTINE W REFLEX MICROSCOPIC
Bilirubin Urine: NEGATIVE
Nitrite: NEGATIVE
Specific Gravity, Urine: 1.035 — ABNORMAL HIGH
Urobilinogen, UA: 0.2

## 2010-12-25 LAB — COMPREHENSIVE METABOLIC PANEL
AST: 18
Albumin: 3.3 — ABNORMAL LOW
BUN: 9
CO2: 23
Calcium: 9
Creatinine, Ser: 0.66
GFR calc Af Amer: >60
GFR calc non Af Amer: >60

## 2010-12-25 LAB — DIFFERENTIAL
Basophils Absolute: 0.1
Basophils Relative: 1
Lymphocytes Relative: 22
Neutro Abs: 5.4
Neutrophils Relative %: 65

## 2010-12-25 LAB — CBC
MCHC: 33.3
MCHC: 33.1
MCV: 88.6
RBC: 4
RDW: 14.4
RDW: 13.8

## 2010-12-25 LAB — URINE MICROSCOPIC-ADD ON

## 2011-01-09 LAB — WOUND CULTURE
Culture: NO GROWTH
Gram Stain: NONE SEEN

## 2011-01-09 LAB — BASIC METABOLIC PANEL
BUN: 2 — ABNORMAL LOW
BUN: 7
CO2: 25
CO2: 27
Calcium: 6.7 — ABNORMAL LOW
Chloride: 107
Chloride: 108
Creatinine, Ser: 0.67
Creatinine, Ser: 2.16 — ABNORMAL HIGH
GFR calc Af Amer: 30 — ABNORMAL LOW
Potassium: 3.8

## 2011-01-09 LAB — COMPREHENSIVE METABOLIC PANEL
ALT: 26
ALT: 8
ALT: 13
AST: 23
Albumin: 2.4 — ABNORMAL LOW
Albumin: 2.5 — ABNORMAL LOW
Alkaline Phosphatase: 55
BUN: 1 — ABNORMAL LOW
CO2: 24
Calcium: 8.2 — ABNORMAL LOW
Calcium: 8.8
Chloride: 106
Chloride: 106
Creatinine, Ser: 0.54
Creatinine, Ser: 0.67
GFR calc Af Amer: >60
GFR calc non Af Amer: >60
Glucose, Bld: 75
Potassium: 3.1 — ABNORMAL LOW
Sodium: 137
Sodium: 138
Sodium: 137
Total Bilirubin: 0.6
Total Bilirubin: 0.5
Total Protein: 5.1 — ABNORMAL LOW
Total Protein: 5.7 — ABNORMAL LOW

## 2011-01-09 LAB — CBC
HCT: 31.9 — ABNORMAL LOW
MCHC: 33.6
MCHC: 34.2
MCHC: 34.7
MCV: 90.2
MCV: 91.5
MCV: 91.6
Platelets: 454 — ABNORMAL HIGH
Platelets: 498 — ABNORMAL HIGH
Platelets: 594 — ABNORMAL HIGH
Platelets: 701 — ABNORMAL HIGH
RBC: 2.67 — ABNORMAL LOW
RBC: 3.22 — ABNORMAL LOW
RDW: 14.3
RDW: 14.3
WBC: 7.6
WBC: 8.7

## 2011-01-09 LAB — DIFFERENTIAL
Basophils Absolute: 0
Basophils Relative: 0
Lymphocytes Relative: 29
Neutro Abs: 4.9

## 2011-01-12 LAB — COMPREHENSIVE METABOLIC PANEL
ALT: 11
AST: 11
Alkaline Phosphatase: 56
Alkaline Phosphatase: 59
BUN: <1 — ABNORMAL LOW
CO2: 29
Calcium: 8.1 — ABNORMAL LOW
Chloride: 102
Chloride: 103
Creatinine, Ser: 0.59
GFR calc Af Amer: >60
GFR calc non Af Amer: >60
Glucose, Bld: 150 — ABNORMAL HIGH
Potassium: 2.9 — ABNORMAL LOW
Potassium: 3.1 — ABNORMAL LOW
Sodium: 136
Total Bilirubin: 0.5
Total Bilirubin: 0.6

## 2011-01-12 LAB — CBC
HCT: 28.9 — ABNORMAL LOW
HCT: 29.5 — ABNORMAL LOW
HCT: 37.8
Hemoglobin: 10.8 — ABNORMAL LOW
Hemoglobin: 13.1
Hemoglobin: 9.9 — ABNORMAL LOW
MCHC: 34.7
MCV: 90.9
MCV: 90.9
MCV: 91.4
Platelets: 332
Platelets: 462 — ABNORMAL HIGH
RBC: 3.22 — ABNORMAL LOW
RBC: 3.49 — ABNORMAL LOW
RDW: 14.6
RDW: 15.2
WBC: 7.8
WBC: 7.8
WBC: 8.6

## 2011-01-12 LAB — CROSSMATCH

## 2011-01-12 LAB — BASIC METABOLIC PANEL
BUN: 6
CO2: 26
Chloride: 108
GFR calc non Af Amer: >60
Glucose, Bld: 110 — ABNORMAL HIGH
Potassium: 4.2
Sodium: 142

## 2011-01-12 LAB — URINALYSIS, ROUTINE W REFLEX MICROSCOPIC
Glucose, UA: NEGATIVE
Ketones, ur: NEGATIVE
Leukocytes, UA: NEGATIVE
Protein, ur: NEGATIVE
pH: 6.5

## 2011-01-12 LAB — ABO/RH: ABO/RH(D): B POS

## 2011-01-14 LAB — CBC
HCT: 40.5
Platelets: 457 — ABNORMAL HIGH
RDW: 13.9

## 2011-01-14 LAB — COMPREHENSIVE METABOLIC PANEL
Albumin: 4
Alkaline Phosphatase: 72
BUN: 13
GFR calc Af Amer: >60
Potassium: 4.1
Total Protein: 7.5

## 2011-01-15 LAB — URINALYSIS, ROUTINE W REFLEX MICROSCOPIC
Glucose, UA: NEGATIVE
Ketones, ur: NEGATIVE
Protein, ur: NEGATIVE

## 2011-01-15 LAB — CBC
Hemoglobin: 13.3
MCHC: 33.7
MCV: 91.4
RBC: 4.3

## 2011-01-15 LAB — URINE MICROSCOPIC-ADD ON

## 2011-01-16 LAB — POCT CARDIAC MARKERS
CKMB, poc: <1 — ABNORMAL LOW
Myoglobin, poc: 13.8
Myoglobin, poc: 18.1
Operator id: 4761
Operator id: 270651
Troponin i, poc: <0.05

## 2011-01-16 LAB — URINALYSIS, ROUTINE W REFLEX MICROSCOPIC
Nitrite: NEGATIVE
Specific Gravity, Urine: 1.025
Urobilinogen, UA: 0.2

## 2011-01-16 LAB — URINE MICROSCOPIC-ADD ON: WBC, UA: NONE SEEN

## 2011-01-16 LAB — DIFFERENTIAL
Eosinophils Absolute: 0.1
Lymphocytes Relative: 39
Lymphocytes Relative: 41
Lymphs Abs: 2.6
Lymphs Abs: 2.5
Neutro Abs: 3.6
Neutrophils Relative %: 55
Neutrophils Relative %: 48

## 2011-01-16 LAB — I-STAT 8, (EC8 V) (CONVERTED LAB)
Acid-base deficit: 1
Bicarbonate: 22.5
HCT: 37
Operator id: 270651
pCO2, Ven: 33 — ABNORMAL LOW

## 2011-01-16 LAB — CBC
HCT: 33.5 — ABNORMAL LOW
MCV: 90.5
Platelets: 414 — ABNORMAL HIGH
Platelets: 426 — ABNORMAL HIGH
WBC: 6.7
WBC: 6

## 2011-01-16 LAB — BASIC METABOLIC PANEL
BUN: 10
Chloride: 110
Glucose, Bld: 95
Potassium: 4

## 2011-01-16 LAB — HEPATIC FUNCTION PANEL
Alkaline Phosphatase: 70
Indirect Bilirubin: 0.2 — ABNORMAL LOW
Total Protein: 6.8

## 2011-01-19 LAB — DIFFERENTIAL
Basophils Absolute: 0.1
Basophils Relative: 1
Eosinophils Absolute: 0.2
Eosinophils Relative: 2
Lymphocytes Relative: 34
Lymphs Abs: 3.6 — ABNORMAL HIGH
Monocytes Absolute: 0.7
Monocytes Relative: 6
Neutro Abs: 6
Neutrophils Relative %: 57

## 2011-01-19 LAB — BASIC METABOLIC PANEL WITH GFR
BUN: 7
CO2: 21
Calcium: 8.2 — ABNORMAL LOW
Chloride: 108
Creatinine, Ser: 0.49
GFR calc non Af Amer: >60
Glucose, Bld: 97
Potassium: 3.6
Sodium: 137

## 2011-01-19 LAB — CARDIAC PANEL(CRET KIN+CKTOT+MB+TROPI)
CK, MB: 0.5
CK, MB: 0.6
CK, MB: 1.1
Relative Index: INVALID
Relative Index: INVALID
Total CK: 56
Total CK: 65
Troponin I: 0.5
Troponin I: 0.51

## 2011-01-19 LAB — COMPREHENSIVE METABOLIC PANEL WITH GFR
ALT: 18
AST: 21
Albumin: 4.1
Chloride: 101
Creatinine, Ser: 0.68
GFR calc Af Amer: >60
Sodium: 135
Total Bilirubin: 0.8

## 2011-01-19 LAB — BASIC METABOLIC PANEL
Chloride: 105
GFR calc Af Amer: >60
GFR calc non Af Amer: >60
Potassium: 3.6
Sodium: 137

## 2011-01-19 LAB — COMPREHENSIVE METABOLIC PANEL
Alkaline Phosphatase: 77
BUN: 13
CO2: 24
Calcium: 9.2
GFR calc non Af Amer: >60
Glucose, Bld: 88
Potassium: 3.8
Total Protein: 8.1

## 2011-01-19 LAB — D-DIMER, QUANTITATIVE

## 2011-01-19 LAB — LIPID PANEL
HDL: 59
Total CHOL/HDL Ratio: 2.3
Triglycerides: 36
VLDL: 7

## 2011-01-19 LAB — CBC
HCT: 38.9
HCT: 33.3 — ABNORMAL LOW
HCT: 35.5 — ABNORMAL LOW
Hemoglobin: 13.5
Hemoglobin: 11.9 — ABNORMAL LOW
MCHC: 34.8
MCHC: 33.6
MCHC: 33.9
MCV: 89.7
MCV: 90.5
MCV: 91.4
Platelets: 477 — ABNORMAL HIGH
Platelets: 397
RBC: 4.33
RBC: 3.65 — ABNORMAL LOW
RBC: 3.92
RDW: 14.5 — ABNORMAL HIGH
RDW: 14.3 — ABNORMAL HIGH
WBC: 10.5
WBC: 6.1
WBC: 7.9

## 2011-01-19 LAB — CK TOTAL AND CKMB (NOT AT ARMC)
CK, MB: 1.1
Relative Index: INVALID
Total CK: 66

## 2011-01-19 LAB — HEPARIN LEVEL (UNFRACTIONATED): Heparin Unfractionated: 1.03 — ABNORMAL HIGH

## 2011-01-19 LAB — URINALYSIS, ROUTINE W REFLEX MICROSCOPIC
Bilirubin Urine: NEGATIVE
Glucose, UA: NEGATIVE
Hgb urine dipstick: NEGATIVE
Ketones, ur: NEGATIVE
Nitrite: NEGATIVE
Protein, ur: NEGATIVE
Specific Gravity, Urine: 1.019
Urobilinogen, UA: 1
pH: 7

## 2011-01-19 LAB — TROPONIN I
Troponin I: 0.45 — ABNORMAL HIGH
Troponin I: 0.53

## 2011-01-19 LAB — HEMOGLOBIN AND HEMATOCRIT, BLOOD: HCT: 38.4

## 2011-01-19 LAB — PROTIME-INR
INR: 1
Prothrombin Time: 13.4

## 2011-01-19 LAB — APTT: aPTT: 32

## 2011-06-26 ENCOUNTER — Other Ambulatory Visit: Payer: Self-pay

## 2011-06-26 ENCOUNTER — Emergency Department (HOSPITAL_COMMUNITY)
Admission: EM | Admit: 2011-06-26 | Discharge: 2011-06-26 | Disposition: A | Discharge status: Home / Self Care | Payer: Medicare Other | Attending: Emergency Medicine | Admitting: Emergency Medicine

## 2011-06-26 DIAGNOSIS — R0602 Shortness of breath: Secondary | ICD-10-CM | POA: Insufficient documentation

## 2011-06-26 DIAGNOSIS — R112 Nausea with vomiting, unspecified: Secondary | ICD-10-CM | POA: Insufficient documentation

## 2011-06-26 DIAGNOSIS — L509 Urticaria, unspecified: Secondary | ICD-10-CM | POA: Insufficient documentation

## 2011-06-26 DIAGNOSIS — T7840XA Allergy, unspecified, initial encounter: Secondary | ICD-10-CM

## 2011-06-26 DIAGNOSIS — R21 Rash and other nonspecific skin eruption: Secondary | ICD-10-CM | POA: Insufficient documentation

## 2011-06-26 MED ORDER — DIPHENHYDRAMINE HCL 25 MG PO TABS: 25.0000 mg | ORAL_TABLET | Freq: Four times a day (QID) | ORAL | Status: DC

## 2011-06-26 MED ORDER — EPINEPHRINE 0.3 MG/0.3ML IJ DEVI: 0.3000 mg | INTRAMUSCULAR | Status: DC | PRN

## 2011-06-26 MED ORDER — FAMOTIDINE IN NACL 20-0.9 MG/50ML-% IV SOLN
20.0000 mg | Freq: Once | INTRAVENOUS | Status: AC
  Administered 2011-06-26: 20 mg via INTRAVENOUS
  Filled 2011-06-26: qty 50

## 2011-06-26 MED ORDER — FAMOTIDINE 20 MG PO TABS: 20.0000 mg | ORAL_TABLET | Freq: Two times a day (BID) | ORAL | Status: DC

## 2011-06-26 MED ORDER — ONDANSETRON HCL 4 MG/2ML IJ SOLN
4.0000 mg | Freq: Once | INTRAMUSCULAR | Status: AC
  Administered 2011-06-26: 4 mg via INTRAVENOUS
  Filled 2011-06-26: qty 2

## 2011-06-26 MED ORDER — DIPHENHYDRAMINE HCL 50 MG/ML IJ SOLN
50.0000 mg | Freq: Once | INTRAMUSCULAR | Status: AC
  Administered 2011-06-26: 50 mg via INTRAVENOUS

## 2011-06-26 MED ORDER — DIPHENHYDRAMINE HCL 25 MG PO CAPS
25.0000 mg | ORAL_CAPSULE | Freq: Once | ORAL | Status: AC
  Administered 2011-06-26: 25 mg via ORAL
  Filled 2011-06-26: qty 1

## 2011-06-26 MED ORDER — HYDROXYZINE HCL 25 MG PO TABS
50.0000 mg | ORAL_TABLET | Freq: Once | ORAL | Status: AC
  Administered 2011-06-26: 50 mg via ORAL
  Filled 2011-06-26: qty 2

## 2011-06-26 MED ORDER — METHYLPREDNISOLONE SODIUM SUCC 125 MG IJ SOLR
125.0000 mg | Freq: Once | INTRAMUSCULAR | Status: AC
  Administered 2011-06-26: 125 mg via INTRAVENOUS

## 2011-06-26 MED ORDER — DIPHENHYDRAMINE HCL 50 MG/ML IJ SOLN: 25.0000 mg | Freq: Once | INTRAMUSCULAR | Status: DC

## 2011-06-26 MED ORDER — PREDNISONE 20 MG PO TABS: 40.0000 mg | ORAL_TABLET | Freq: Every day | ORAL | Status: DC

## 2011-06-26 NOTE — ED Provider Notes (Signed)
History     CSN: [id number]  Arrival date & time 06/26/11  1007   None     Chief Complaint  Patient presents with  . Allergic Reaction    (Consider location/radiation/quality/duration/timing/severity/associated sxs/prior treatment) HPI Comments: Patient with multiple allergies with urticarial reaction starting this morning about 6am. Patient states she awoke and administered epi pen. She did not seek help after this point. Itching continued. Patient started having sensation of itching in throat and shortness of breath so she came to hospital. She did not have another epi pen. Patient states she vomited with itching earlier. She has PCP appointment upcoming to explore allergies.   Patient is a 46 y.o. female presenting with allergic reaction. The history is provided by the patient.  Allergic Reaction The primary symptoms are  shortness of breath, nausea, vomiting, rash and urticaria. The primary symptoms do not include wheezing, cough, abdominal pain, diarrhea or angioedema. The current episode started 3 to 5 hours ago.  The rash is associated with itching.  Significant symptoms also include itching. Significant symptoms that are not present include eye redness or rhinorrhea.    No past medical history on file.  No past surgical history on file.  No family history on file.  History  Substance Use Topics  . Smoking status: Not on file  . Smokeless tobacco: Not on file  . Alcohol Use: Not on file    OB History    No data available      Review of Systems  Constitutional: Negative for fever.  HENT: Negative for sore throat and rhinorrhea.   Eyes: Negative for redness.  Respiratory: Positive for shortness of breath. Negative for cough and wheezing.   Cardiovascular: Negative for chest pain.  Gastrointestinal: Positive for nausea and vomiting. Negative for abdominal pain and diarrhea.  Genitourinary: Negative for dysuria.  Musculoskeletal: Negative for myalgias.  Skin:  Positive for color change, itching and rash.  Neurological: Negative for headaches.    Allergies  Latex; Codeine; Hydrocodone; and Penicillins  Home Medications   Current Outpatient Rx  Name Route Sig Dispense Refill  . CLONAZEPAM 1 MG PO TABS Oral Take 1 mg by mouth at bedtime.    . MULTI-VITAMIN/MINERALS PO TABS Oral Take 1 tablet by mouth daily.    Marland Kitchen CALCIUM POLYCARBOPHIL 625 MG PO TABS Oral Take 625 mg by mouth daily.    Marland Kitchen TEMAZEPAM 7.5 MG PO CAPS Oral Take 7.5 mg by mouth at bedtime as needed.      BP 151/139  Pulse 99  Temp(Src) 98.1 F (36.7 C) (Oral)  Resp 23  SpO2 99%  Physical Exam  Nursing note and vitals reviewed. Constitutional: She is oriented to person, place, and time. She appears well-developed and well-nourished.  HENT:  Head: Normocephalic and atraumatic.  Right Ear: External ear normal.  Left Ear: External ear normal.  Nose: Nose normal.  Mouth/Throat: Uvula is midline, oropharynx is clear and moist and mucous membranes are normal. No uvula swelling.       No lip or oral airway swelling  Eyes: Conjunctivae are normal. Right eye exhibits no discharge. Left eye exhibits no discharge.  Neck: Normal range of motion. Neck supple.  Cardiovascular: Normal rate, regular rhythm and normal heart sounds.   Pulmonary/Chest: Effort normal and breath sounds normal.       Tachypnic initially but this improved without treatment. No wheezing or stridor.   Abdominal: Soft. There is no tenderness.  Neurological: She is alert and oriented to person, place,  and time.  Skin: Skin is warm and dry.  Psychiatric: She has a normal mood and affect.    ED Course  Procedures (including critical care time)  Labs Reviewed - No data to display No results found.   1. Allergic reaction     10:08 AM Patient seen and examined. Medications ordered. Epi to bedside but not given. Patient's breathing and throat 'itching' symptoms improved as she calmed down. Meds given.   Vital  signs reviewed and are as follows: Filed Vitals:   06/26/11 1013  BP: 151/139  Pulse: 99  Temp: 98.1 F (36.7 C)  Resp: 23   3:40 PM Patient has remained stable during her time in ED. She is breathing well and is not in respiratory distress. Itching has improved. Patient observed for 5 hours. Will d/c to home on benadryl, pepcid, and prednisone. Patient urged to call 9-1-1 next time she uses an epi pen. Encouraged PCP follow-up. Urged return if worsening, SOB, trouble breathing.    MDM  Patient with allergic reaction, itching sensation in throat but no respiratory compromise. Treated in ED and observed for >5 hours. She is stable and improved.         [place], [place] 06/26/11 [phone]

## 2011-06-26 NOTE — ED Notes (Signed)
Dc instructions given

## 2011-06-26 NOTE — ED Notes (Signed)
Pt states that she is seeing an allergist and has an allergy to bee, banana, latex, onions with anaphlatic shock, and multiple things and has sob, cough, states that she gave herself 1 epi pen given at home with no relief

## 2011-06-26 NOTE — Discharge Instructions (Signed)
Please read and follow all provided instructions.  Your diagnoses today include:  1. Allergic reaction     Tests performed today include:  Vital signs. See below for your results today.   Medications prescribed:   Benadryl (diphenhydramine) - antihistamine that blocks allergic reaction  Pepcid (famotidine) - antihistamine that blocks allergic reaction  You can find the antihistamines listed above over-the-counter. Take these for 3 days.  DO NOT exceed:   50mg  Benadryl every 6 hours  20mg  Pepcid every 12 hours   Benadryl will make you drowsy. DO NOT drive or perform any activities that require you to be awake and alert if taking this. Use Pepcid or Zyrtec if performing these activities.    Prednisone - steroid medicine that blocks allergic reaction  Please take the prednisone as prescribed for 5 days. It is best to take this medication in the morning to prevent sleeping problems. If you are diabetic, monitor your blood sugar closely and stop taking Prednisone if blood sugar is over 300. Take with food to prevent stomach upset.    Epi-pen - inject into thigh as directed if you have a severe reaction that causes throat swelling or any trouble breathing. Call 9-1-1 if you use an Epi-pen. You should be evaluated at a hospital as soon as possible.    Take any prescribed medications only as directed.  Home care instructions:   Follow any educational materials contained in this packet  Follow-up instructions: Please follow-up with your primary care provider in the next 3 days for further evaluation of your symptoms. If you do not have a primary care doctor -- see below for referral information.   Return instructions:   Please return to the Emergency Department if you experience worsening symptoms.   Call 9-1-1 immediately if you have an allergic reaction that involves your lips, mouth, throat or if you have any difficulty breathing. This is a life-threatening emergency.    Please return if you have any other emergent concerns.  Additional Information:  Your vital signs today were: BP 125/72  Pulse 81  Temp(Src) 97.9 F (36.6 C) (Oral)  Resp 21  SpO2 100% If your blood pressure (BP) was elevated above 135/85 this visit, please have this repeated by your doctor within one month. -------------- No Primary Care Doctor Call [facility]  [phone] Other agencies that provide inexpensive medical care    [facility]  [phone]    [facility]  [phone]    [facility]  [phone]    [facility]  [phone]    [facility]  [phone]    [facility]  [phone] -------------- RESOURCE GUIDE:  Dental Problems  Patients with Medicaid: [facility] [phone] [address]                                            [phone] [address] Phone:  [phone]                                                   Phone:  [phone]  If unable to  pay or uninsured, contact:  [facility] or [facility] to become qualified for the adult dental clinic.  Chronic Pain Problems Contact [facility]  [phone] Patients need to be referred by their primary care doctor.  Insufficient Money for Medicine Contact [employer]:  call "211" or [facility] 201-502-1878.  Psychological Services [facility]  [phone] [facility]  [phone] [facility]   [phone] (emergency services [phone])  Substance Abuse Resources [facility]  [phone] [facility] [phone] [facility] [phone] Floydene Flock [phone] Residential & Outpatient Substance Abuse Program  [phone]  Abuse/Neglect [facility] Child Abuse Hotline 4053253671 [facility] Child Abuse Hotline 407-734-6245 (After Hours)  Emergency Shelter [facility] [phone]  Maternity Homes Room at the [place] [phone] [place] [facility] [phone]  [facility] Resources  [facility]     [employer]                          [facility] Dept. [address] [place]                       [address]      [address]  Blondell Reveal Phone:  [phone]                                   Phone:  [phone]                 Phone:  [phone]  [facility] Phone:  [phone]  [facility] Child Abuse Hotline [phone] 303-080-2729 (After Hours)

## 2011-06-26 NOTE — ED Notes (Signed)
[id number]<AV> Expected date:<BR> Expected time:<BR> Means of arrival:<BR> Comments:<BR> Patient from triage

## 2011-06-29 NOTE — ED Provider Notes (Signed)
Medical screening examination/treatment/procedure(s) were performed by non-physician practitioner and as supervising physician I was immediately available for consultation/collaboration.   Benny Lennert, MD 06/29/11 [phone]

## 2011-06-30 MED FILL — Epinephrine Solution Auto-injector 0.15 MG/0.3ML (1:2000): INTRAMUSCULAR | Qty: 0.3 | Status: AC

## 2011-07-04 ENCOUNTER — Emergency Department (HOSPITAL_BASED_OUTPATIENT_CLINIC_OR_DEPARTMENT_OTHER)
Admission: EM | Admit: 2011-07-04 | Discharge: 2011-07-04 | Disposition: A | Discharge status: Home / Self Care | Payer: Medicare Other | Attending: Emergency Medicine | Admitting: Emergency Medicine

## 2011-07-04 ENCOUNTER — Encounter (HOSPITAL_BASED_OUTPATIENT_CLINIC_OR_DEPARTMENT_OTHER): Payer: Self-pay

## 2011-07-04 DIAGNOSIS — T7840XA Allergy, unspecified, initial encounter: Secondary | ICD-10-CM

## 2011-07-04 DIAGNOSIS — I10 Essential (primary) hypertension: Secondary | ICD-10-CM | POA: Insufficient documentation

## 2011-07-04 DIAGNOSIS — L298 Other pruritus: Secondary | ICD-10-CM | POA: Insufficient documentation

## 2011-07-04 DIAGNOSIS — E669 Obesity, unspecified: Secondary | ICD-10-CM | POA: Insufficient documentation

## 2011-07-04 DIAGNOSIS — R21 Rash and other nonspecific skin eruption: Secondary | ICD-10-CM | POA: Insufficient documentation

## 2011-07-04 DIAGNOSIS — L2989 Other pruritus: Secondary | ICD-10-CM | POA: Insufficient documentation

## 2011-07-04 HISTORY — DX: Obesity, unspecified: E66.9

## 2011-07-04 HISTORY — DX: Enthesopathy, unspecified: M77.9

## 2011-07-04 HISTORY — DX: Essential (primary) hypertension: I10

## 2011-07-04 MED ORDER — ONDANSETRON HCL 4 MG/2ML IJ SOLN
INTRAMUSCULAR | Status: AC
  Administered 2011-07-04: 4 mg
  Filled 2011-07-04: qty 2

## 2011-07-04 MED ORDER — EPINEPHRINE 0.3 MG/0.3ML IJ DEVI: 0.3000 mg | INTRAMUSCULAR | Status: DC | PRN

## 2011-07-04 MED ORDER — RANITIDINE HCL 150 MG PO TABS: 150.0000 mg | ORAL_TABLET | Freq: Two times a day (BID) | ORAL | Status: DC

## 2011-07-04 MED ORDER — TRAMADOL HCL 50 MG PO TABS
50.0000 mg | ORAL_TABLET | Freq: Once | ORAL | Status: AC
  Administered 2011-07-04: 50 mg via ORAL
  Filled 2011-07-04: qty 1

## 2011-07-04 MED ORDER — KETOROLAC TROMETHAMINE 30 MG/ML IJ SOLN
30.0000 mg | Freq: Once | INTRAMUSCULAR | Status: AC
  Administered 2011-07-04: 30 mg via INTRAVENOUS
  Filled 2011-07-04: qty 1

## 2011-07-04 MED ORDER — PREDNISONE 50 MG PO TABS: 50.0000 mg | ORAL_TABLET | Freq: Every day | ORAL | Status: AC

## 2011-07-04 MED ORDER — METHYLPREDNISOLONE SODIUM SUCC 125 MG IJ SOLR
125.0000 mg | Freq: Once | INTRAMUSCULAR | Status: AC
  Administered 2011-07-04: 125 mg via INTRAVENOUS
  Filled 2011-07-04: qty 2

## 2011-07-04 MED ORDER — HYDROXYZINE HCL 25 MG PO TABS
50.0000 mg | ORAL_TABLET | Freq: Once | ORAL | Status: AC
  Administered 2011-07-04: 50 mg via ORAL
  Filled 2011-07-04: qty 2

## 2011-07-04 MED ORDER — HYDROXYZINE HCL 25 MG PO TABS: 25.0000 mg | ORAL_TABLET | Freq: Four times a day (QID) | ORAL | Status: AC

## 2011-07-04 MED ORDER — LORAZEPAM 2 MG/ML IJ SOLN
1.0000 mg | Freq: Once | INTRAMUSCULAR | Status: AC
  Administered 2011-07-04: 1 mg via INTRAVENOUS
  Filled 2011-07-04: qty 1

## 2011-07-04 NOTE — ED Provider Notes (Signed)
History     CSN: [id number]  Arrival date & time 07/04/11  1307   First MD Initiated Contact with Patient 07/04/11 1331      Chief Complaint  Patient presents with  . Allergic Reaction    (Consider location/radiation/quality/duration/timing/severity/associated sxs/prior treatment) HPI Comments: Patient presents today with an allergic reaction that began earlier this morning.  Patient notes only past allergic reactions have been a piece for which she does keep EpiPen to home.  She has not changed any medications either prescription or over-the-counter.  She is use no other soaps, lotions or new detergents.  She's noted a red rash across her face and that she feels and itchiness in her throat.  She also is noted diffuse body itching.  She does not have diffuse rash.  Patient denies any nausea or vomiting.  She did use Benadryl at home at 6:30 this morning 50 mg.  She also used her EpiPen prior to EMS arrival EMS gave her Zantac and route as well.  Patient is a 46 y.o. female presenting with allergic reaction. The history is provided by the patient. No language interpreter was used.  Allergic Reaction The primary symptoms are  rash. The primary symptoms do not include wheezing, shortness of breath, cough, abdominal pain, nausea, vomiting, diarrhea, dizziness, palpitations, altered mental status or angioedema. The current episode started 6 to 12 hours ago.  Significant symptoms that are not present include eye redness.    Past Medical History  Diagnosis Date  . Tendonitis   . Obesity   . Hypertension     Past Surgical History  Procedure Date  . Cesarean section   . Abdominal hysterectomy   . Bowel resection   . Knee surgery     History reviewed. No pertinent family history.  History  Substance Use Topics  . Smoking status: Passive Smoker  . Smokeless tobacco: Never Used  . Alcohol Use: No    OB History    Grav Para Term Preterm Abortions TAB SAB Ect Mult Living         Review of Systems  Constitutional: Negative.  Negative for fever and chills.  HENT: Negative.   Eyes: Negative.  Negative for discharge and redness.  Respiratory: Negative.  Negative for cough, shortness of breath and wheezing.   Cardiovascular: Negative.  Negative for chest pain and palpitations.  Gastrointestinal: Negative.  Negative for nausea, vomiting, abdominal pain and diarrhea.  Genitourinary: Negative.  Negative for dysuria and vaginal discharge.  Musculoskeletal: Negative.  Negative for back pain.  Skin: Positive for rash. Negative for color change.  Neurological: Negative.  Negative for dizziness, syncope and headaches.  Hematological: Negative.  Negative for adenopathy.  Psychiatric/Behavioral: Negative.  Negative for confusion and altered mental status.  All other systems reviewed and are negative.    Allergies  Latex; Codeine; Hydrocodone; and Penicillins  Home Medications   Current Outpatient Rx  Name Route Sig Dispense Refill  . CLONAZEPAM 1 MG PO TABS Oral Take 1 mg by mouth at bedtime.    Marland Kitchen DIPHENHYDRAMINE HCL 25 MG PO TABS Oral Take 50 mg by mouth once.    Marland Kitchen EPINEPHRINE 0.3 MG/0.3ML IJ DEVI Intramuscular Inject 0.3 mLs (0.3 mg total) into the muscle as needed. 1 Device 0  . MULTI-VITAMIN/MINERALS PO TABS Oral Take 1 tablet by mouth daily.    Marland Kitchen CALCIUM POLYCARBOPHIL 625 MG PO TABS Oral Take 625 mg by mouth daily.    Marland Kitchen PREDNISONE 20 MG PO TABS Oral Take 2 tablets (  40 mg total) by mouth daily. 10 tablet 0  . TEMAZEPAM 7.5 MG PO CAPS Oral Take 7.5 mg by mouth at bedtime as needed.    Marland Kitchen ZANTAC IJ Injection Inject 50 mg as directed once.      BP 122/86  Pulse 105  Temp(Src) 98.9 F (37.2 C) (Oral)  Resp 20  Ht 4\' 11"  (1.499 m)  Wt 181 lb 9.6 oz (82.373 kg)  BMI 36.68 kg/m2  SpO2 95%  Physical Exam  Nursing note and vitals reviewed. Constitutional: She is oriented to person, place, and time. She appears well-developed and well-nourished.  Non-toxic  appearance. She does not have a sickly appearance.  HENT:  Head: Normocephalic and atraumatic.  Mouth/Throat: Oropharynx is clear and moist. No oropharyngeal exudate.       Uvula is midline.  No peritonsillar swelling.  No tongue swelling.  Eyes: Conjunctivae, EOM and lids are normal. Pupils are equal, round, and reactive to light. No scleral icterus.  Neck: Trachea normal and normal range of motion. Neck supple.  Cardiovascular: Normal rate, regular rhythm and normal heart sounds.   Pulmonary/Chest: Effort normal and breath sounds normal. She has no wheezes.  Abdominal: Soft. Normal appearance. There is no tenderness. There is no rebound, no guarding and no CVA tenderness.  Musculoskeletal: Normal range of motion.  Neurological: She is alert and oriented to person, place, and time. She has normal strength.  Skin: Skin is warm, dry and intact. Rash noted.       Patient has an erythematous rash across both cheeks and her nose.  Psychiatric: She has a normal mood and affect. Her behavior is normal. Thought content normal.    ED Course  Procedures (including critical care time)  Labs Reviewed - No data to display No results found.   No diagnosis found.    MDM  Patient with allergic reaction today with no signs of respiratory distress.  Patient did seem moderately anxious on arrival which is improved after medications here.  She now has oxygen saturations are in the upper 90s and his heart rates in the 80s to 90s.  Patient was given steroids here and I will give her steroids for home as well as Atarax, Pepcid and continue use of Benadryl at home.        Nat Christen, MD 07/04/11 1501

## 2011-07-04 NOTE — ED Notes (Signed)
EDP [facility] notified that pt states she has allergy to tylenol and also lists allergy to hydrocodone

## 2011-07-04 NOTE — ED Notes (Signed)
EDP notified of pt's c/o headache at time of d/c

## 2011-07-04 NOTE — Discharge Instructions (Signed)
Allergic Reaction, Mild to Moderate °Allergies may happen from anything your body is sensitive to. This may be food, medications, pollens, chemicals, and nearly anything around you in everyday life that produces allergens. An allergen is anything that causes an allergy producing substance. Allergens cause your body to release allergic antibodies. Through a chain of events, they cause a release of histamine into the blood stream. Histamines are meant to protect you, but they also cause your discomfort. This is why antihistamines are often used for allergies. Heredity is often a factor in causing allergic reactions. This means you may have some of the same allergies as your parents. °Allergies happen in all age groups. You may have some idea of what caused your reaction. There are many allergens around us. It may be difficult to know what caused your reaction. If this is a first time event, it may never happen again. Allergies cannot be cured but can be controlled with medications. °SYMPTOMS  °You may get some or all of the following problems from allergies. °· Swelling and itching in and around the mouth.  °· Tearing, itchy eyes.  °· Nasal congestion and runny nose.  °· Sneezing and coughing.  °· An itchy red rash or hives.  °· Vomiting or diarrhea.  °· Difficulty breathing.  °Seasonal allergies occur in all age groups. They are seasonal because they usually occur during the same season every year. They may be a reaction to molds, grass pollens, or tree pollens. Other causes of allergies are house dust mite allergens, pet dander and mold spores. These are just a common few of the thousands of allergens around us. All of the symptoms listed above happen when you come in contact with pollens and other allergens. Seasonal allergies are usually not life threatening. They are generally more of a nuisance that can often be handled using medications. °Hay fever is a combination of all or some of the above listed allergy  problems. It may often be treated with simple over-the-counter medications such as diphenhydramine. Take medication as directed. Check with your caregiver or package insert for child dosages. °TREATMENT AND HOME CARE INSTRUCTIONS °If hives or rash are present: °· Take medications as directed.  °· You may use an over-the-counter antihistamine (diphenhydramine) for hives and itching as needed. Do not drive or drink alcohol until medications used to treat the reaction have worn off. Antihistamines tend to make people sleepy.  °· Apply cold cloths (compresses) to the skin or take baths in cool water. This will help itching. Avoid hot baths or showers. Heat will make a rash and itching worse.  °· If your allergies persist and become more severe, and over the counter medications are not effective, there are many new medications your caretaker can prescribe. Immunotherapy or desensitizing injections can be used if all else fails. Follow up with your caregiver if problems continue.  °SEEK MEDICAL CARE IF:  °· Your allergies are becoming progressively more troublesome.  °· You suspect a food allergy. Symptoms generally happen within 30 minutes of eating a food.  °· Your symptoms have not gone away within 2 days or are getting worse.  °· You develop new symptoms.  °· You want to retest yourself or your child with a food or drink you think causes an allergic reaction. Never test yourself or your child of a suspected allergy without being under the watchful eye of your caregivers. A second exposure to an allergen may be life-threatening.  °SEEK IMMEDIATE MEDICAL CARE IF: °· You   develop difficulty breathing or wheezing, or have a tight feeling in your chest or throat.  °· You develop a swollen mouth, hives, swelling, or itching all over your body.  °A severe reaction with any of the above problems should be considered life-threatening. If you suddenly develop difficulty breathing call for local emergency medical help. THIS IS AN  EMERGENCY. °MAKE SURE YOU:  °· Understand these instructions.  °· Will watch your condition.  °· Will get help right away if you are not doing well or get worse.  °Document Released: 01/18/2007 Document Revised: 03/12/2011 Document Reviewed: 01/18/2007 °ExitCare® Patient Information ©2012 [employer]. ° ° ° ° °Epinephrine Injection °Epinephrine is a medicine given by injection to temporarily treat an emergency allergic reaction. It is also used to treat severe asthmatic attacks and other lung problems. The medicine helps to enlarge (dilate) the small breathing tubes of the lungs. A life-threatening, sudden allergic reaction that involves the whole body is called anaphylaxis. Because of potential side effects, epinephrine should only be used as directed by your caregiver. °RISKS AND COMPLICATIONS °Possible side effects of epinephrine injections include: °· Chest pain.  °· Irregular or rapid heartbeat.  °· Shortness of breath.  °· Nausea.  °· Vomiting.  °· Abdominal pain or cramping.  °· Sweating.  °· Dizziness.  °· Weakness.  °· Headache.  °· Nervousness.  °Report all side effects to your caregiver. °HOW TO GIVE AN EPINEPHRINE INJECTION °Give the epinephrine injection immediately when symptoms of a severe reaction begin. Inject the medicine into the outer thigh or any available, large muscle. Your caregiver can teach you how to do this. You do not need to remove any clothing. After the injection, call your local emergency services (911 in [place] Even if you improve after the injection, you need to be examined at a hospital emergency department. Epinephrine works quickly, but it also wears off quickly. Delayed reactions can occur. A delayed reaction may be as serious and dangerous as the initial reaction. °HOME CARE INSTRUCTIONS °· Make sure you and your family know how to give an epinephrine injection.  °· Use epinephrine injections as directed by your caregiver. Do not use this medicine more often or in larger  doses than prescribed.  °· Always carry your epinephrine injection or anaphylaxis kit with you. This can be lifesaving if you have a severe reaction.  °· Store the medicine in a cool, dry place. If the medicine becomes discolored or cloudy, dispose of it properly and replace it with new medicine.  °· Check the expiration date on your medicine. It may be unsafe to use medicines past their expiration date.  °· Tell your caregiver about any other medicines you are taking. Some medicines can react badly with epinephrine.  °· Tell your caregiver about any medical conditions you have, such as diabetes, high blood pressure (hypertension), heart disease, irregular heartbeats, or if you are pregnant.  °SEEK IMMEDIATE MEDICAL CARE IF: °· You have used an epinephrine injection. Call your local emergency services (911 in [place] Even if you improve after the injection, you need to be examined at a hospital emergency department to make sure your allergic reaction is under control. You will also be monitored for adverse effects from the medicine.  °· You have chest pain.  °· You have irregular or fast heartbeats.  °· You have shortness of breath.  °· You have severe headaches.  °· You have severe nausea, vomiting, or abdominal cramps.  °· You have severe pain, swelling, or   redness in the area where you gave the injection.  °Document Released: 03/20/2000 Document Revised: 03/12/2011 Document Reviewed: 12/10/2010 °ExitCare® Patient Information ©2012 [employer]. °

## 2011-07-04 NOTE — ED Notes (Signed)
Pt in nad at this time,  Although she is highly anxious.

## 2011-07-04 NOTE — ED Notes (Signed)
Pt with allergic reaction to unknown substance.  Itching, swelling, redness, angioedema.  Given zantac, benadryl, epi pen (from home) 0.3mg 

## 2011-07-12 ENCOUNTER — Encounter (HOSPITAL_BASED_OUTPATIENT_CLINIC_OR_DEPARTMENT_OTHER): Payer: Self-pay

## 2011-07-12 ENCOUNTER — Observation Stay (HOSPITAL_BASED_OUTPATIENT_CLINIC_OR_DEPARTMENT_OTHER)
Admission: EM | Admit: 2011-07-12 | Discharge: 2011-07-12 | Disposition: A | Discharge status: Home / Self Care | Payer: Medicare Other | Attending: Emergency Medicine | Admitting: Emergency Medicine

## 2011-07-12 DIAGNOSIS — T7840XA Allergy, unspecified, initial encounter: Secondary | ICD-10-CM

## 2011-07-12 DIAGNOSIS — L272 Dermatitis due to ingested food: Principal | ICD-10-CM | POA: Insufficient documentation

## 2011-07-12 MED ORDER — ALBUTEROL SULFATE (5 MG/ML) 0.5% IN NEBU
5.0000 mg | INHALATION_SOLUTION | Freq: Once | RESPIRATORY_TRACT | Status: AC
  Administered 2011-07-12: 5 mg via RESPIRATORY_TRACT
  Filled 2011-07-12: qty 1

## 2011-07-12 MED ORDER — DIPHENHYDRAMINE HCL 50 MG/ML IJ SOLN
INTRAMUSCULAR | Status: AC
  Administered 2011-07-12: 25 mg
  Filled 2011-07-12: qty 1

## 2011-07-12 MED ORDER — DIPHENHYDRAMINE HCL 50 MG/ML IJ SOLN
50.0000 mg | Freq: Once | INTRAMUSCULAR | Status: AC
  Administered 2011-07-12: 50 mg via INTRAVENOUS
  Filled 2011-07-12: qty 1

## 2011-07-12 MED ORDER — EPINEPHRINE 0.3 MG/0.3ML IJ DEVI: 0.3000 mg | INTRAMUSCULAR | Status: DC | PRN

## 2011-07-12 MED ORDER — METHYLPREDNISOLONE SODIUM SUCC 125 MG IJ SOLR
125.0000 mg | Freq: Once | INTRAMUSCULAR | Status: AC
  Administered 2011-07-12: 125 mg via INTRAVENOUS
  Filled 2011-07-12: qty 2

## 2011-07-12 MED ORDER — PREDNISONE 50 MG PO TABS: ORAL_TABLET | ORAL | Status: AC

## 2011-07-12 MED ORDER — DIPHENHYDRAMINE HCL 50 MG/ML IJ SOLN
25.0000 mg | Freq: Once | INTRAMUSCULAR | Status: AC
  Administered 2011-07-12: 25 mg via INTRAVENOUS
  Filled 2011-07-12: qty 1

## 2011-07-12 MED ORDER — DIPHENHYDRAMINE HCL 50 MG/ML IJ SOLN: 25.0000 mg | INTRAMUSCULAR | Status: DC | PRN

## 2011-07-12 MED ORDER — LORAZEPAM 2 MG/ML IJ SOLN
1.0000 mg | Freq: Once | INTRAMUSCULAR | Status: AC
  Administered 2011-07-12: 1 mg via INTRAVENOUS
  Filled 2011-07-12: qty 1

## 2011-07-12 MED ORDER — ALBUTEROL SULFATE (5 MG/ML) 0.5% IN NEBU
INHALATION_SOLUTION | RESPIRATORY_TRACT | Status: AC
  Administered 2011-07-12: 5 mg via RESPIRATORY_TRACT
  Filled 2011-07-12: qty 0.5

## 2011-07-12 MED ORDER — PREDNISONE 50 MG PO TABS
60.0000 mg | ORAL_TABLET | Freq: Every day | ORAL | Status: DC
  Administered 2011-07-12: 60 mg via ORAL
  Filled 2011-07-12: qty 1

## 2011-07-12 MED ORDER — ONDANSETRON HCL 4 MG/2ML IJ SOLN: 4.0000 mg | Freq: Four times a day (QID) | INTRAMUSCULAR | Status: DC | PRN

## 2011-07-12 MED ORDER — EPINEPHRINE 0.3 MG/0.3ML IJ DEVI
INTRAMUSCULAR | Status: AC
  Filled 2011-07-12: qty 0.6

## 2011-07-12 MED ORDER — ALBUTEROL SULFATE (5 MG/ML) 0.5% IN NEBU
INHALATION_SOLUTION | RESPIRATORY_TRACT | Status: AC
  Filled 2011-07-12: qty 0.5

## 2011-07-12 NOTE — ED Notes (Signed)
Dr Bebe Shaggy informed that pt states her itching has resolved.

## 2011-07-12 NOTE — ED Notes (Signed)
Informed Dr Bebe Shaggy that pt wishes to go home.  He is to re-eval shortly.

## 2011-07-12 NOTE — ED Notes (Addendum)
Per [facility], pt was at [facility] eating sandwich and had allergic reaction to onions.  Pt was given 2 0.3mg  EpiPens by EMS, Zantac 50mg , Benadryl 25mg , and Albuterol 5mg .  Pt in nad at this time, c/o itching to throat/face.  PIV 20g L AC.

## 2011-07-12 NOTE — ED Provider Notes (Signed)
History   This chart was scribed for Sheri Gaskins, MD by [name] . The patient was seen in room MH05/MH05 and the patient's care was started at 3:04pm.    CSN: [id number]  Arrival date & time 07/12/11  1448   First MD Initiated Contact with Patient 07/12/11 1501      Chief Complaint  Patient presents with  . Allergic Reaction     HPI [name] is a 46 y.o. female who presents to the Emergency Department complaining of single episode, moderate allergic reaction that occurred approximately 1 hours ago after eating onions at a local [employer]. Patient states that her throat started closing, and reports associated generalized itching. Patient denies LOC. Patient denies v/d. Patient states that she received 2 0.3 mg EpiPens by EMS, Zantac 50 mg, Benadryl 25 mg, and Albuterol 5 mg. Patient states that the EpiPens helped her symptoms. Patient states that she is still currently experiencing itching throughout her body and that her throat is sore. Patient denies any lip or tongue swelling. Patient states that she has a h/o similar episodes. Patient denies any past medical problems. Patient states that she has a family h/o diabetes. She feels improved meds improved her symptoms  PCP: Dr. Everlene Other.  Family History - positive for diabetes  Past Medical History  Diagnosis Date  . Tendonitis   . Obesity   . Hypertension     Past Surgical History  Procedure Date  . Cesarean section   . Abdominal hysterectomy   . Bowel resection   . Knee surgery     History reviewed. No pertinent family history.  History  Substance Use Topics  . Smoking status: Passive Smoker  . Smokeless tobacco: Never Used  . Alcohol Use: No    OB History    Grav Para Term Preterm Abortions TAB SAB Ect Mult Living                  Review of Systems A complete 10 system review of systems was obtained and all systems are negative except as noted in the HPI and PMH.   Allergies  Banana; Latex;  Onion; Codeine; Hydrocodone; and Penicillins  Home Medications   Current Outpatient Rx  Name Route Sig Dispense Refill  . CLONAZEPAM 1 MG PO TABS Oral Take 1 mg by mouth at bedtime.    Marland Kitchen DIPHENHYDRAMINE HCL 25 MG PO TABS Oral Take 50 mg by mouth once.    Marland Kitchen HYDROXYZINE HCL 25 MG PO TABS Oral Take 1 tablet (25 mg total) by mouth every 6 (six) hours. 20 tablet 0  . MULTI-VITAMIN/MINERALS PO TABS Oral Take 1 tablet by mouth daily.    Marland Kitchen CALCIUM POLYCARBOPHIL 625 MG PO TABS Oral Take 625 mg by mouth daily.    Marland Kitchen PREDNISONE 50 MG PO TABS Oral Take 1 tablet (50 mg total) by mouth daily. 5 tablet 0  . RANITIDINE HCL 150 MG PO TABS Oral Take 1 tablet (150 mg total) by mouth 2 (two) times daily. 14 tablet 0  . TEMAZEPAM 7.5 MG PO CAPS Oral Take 7.5 mg by mouth at bedtime as needed. For insomnia    . ALBUTEROL SULFATE (5 MG/ML) 0.5% IN NEBU Nebulization Take 5 mg by nebulization once.    Marland Kitchen DIPHENHYDRAMINE HCL 50 MG/ML IJ SOLN Intravenous Inject 25 mg into the vein once.    Marland Kitchen EPINEPHRINE 0.3 MG/0.3ML IJ DEVI Intramuscular Inject 0.3 mg into the muscle daily as needed. For severe allergic reaction    .  EPINEPHRINE 0.3 MG/0.3ML IJ DEVI Intramuscular Inject 0.3 mLs (0.3 mg total) into the muscle as needed. 1 Device 0  . ZANTAC IJ Injection Inject 50 mg as directed once.      BP 122/77  Pulse 115  Temp(Src) 98.4 F (36.9 C) (Oral)  Resp 20  Ht 5' (1.524 m)  Wt 181 lb (82.101 kg)  BMI 35.35 kg/m2  SpO2 98%  Physical Exam CONSTITUTIONAL: Well developed/well nourished HEAD AND FACE: Normocephalic/atraumatic EYES: EOMI/PERRL ENMT: Mucous membranes moist, no angioedema, no stridor NECK: supple no meningeal signs SPINE:entire spine nontender CV: S1/S2 noted, no murmurs/rubs/gallops noted LUNGS: no apparent distress, wheezing bilaterally ABDOMEN: soft, nontender, no rebound or guarding GU:no cva tenderness NEURO: Pt is awake/alert, moves all extremitiesx4 EXTREMITIES: pulses normal, full ROM SKIN:  warm, color normal, no rash noted. No angioedema noted.  PSYCH: no abnormalities of mood noted  ED Course  Procedures   DIAGNOSTIC STUDIES: Oxygen Saturation is 98% on room air, normal by my interpretation.    COORDINATION OF CARE:  1507: Discussed planned course of treatment with the patient who is agreeable at this time.   Meds ordered this encounter  Medications  . DISCONTD: EPINEPHrine (EPI-PEN) 0.3 mg/0.3 mL injection    Sig:     Dangerfield, Kyle: cabinet override  . albuterol (PROVENTIL) (5 MG/ML) 0.5% nebulizer solution    Sig:     Dangerfield, Kyle: cabinet override  . diphenhydrAMINE (BENADRYL) 50 MG/ML injection    Sig:     Dangerfield, Kyle: cabinet override  . albuterol (PROVENTIL) (5 MG/ML) 0.5% nebulizer solution    Sig:     Dangerfield, Kyle: cabinet override  . diphenhydrAMINE (BENADRYL) 50 MG/ML injection    Sig: Inject 25 mg into the vein once.  Marland Kitchen albuterol (PROVENTIL) (5 MG/ML) 0.5% nebulizer solution    Sig: Take 5 mg by nebulization once.  . diphenhydrAMINE (BENADRYL) injection 50 mg    Sig:   . albuterol (PROVENTIL) (5 MG/ML) 0.5% nebulizer solution 5 mg    Sig:   . methylPREDNISolone sodium succinate (SOLU-MEDROL) 125 mg/2 mL injection 125 mg    Sig:   . LORazepam (ATIVAN) injection 1 mg    Sig:   . diphenhydrAMINE (BENADRYL) injection 25 mg    Sig:   . predniSONE (DELTASONE) tablet 60 mg    Sig:   . ondansetron (ZOFRAN) injection 4 mg    Sig:    4:09 PM Since patient received epipen will monitor in ED Pt stable at this time 5:26 PM Pt with some improvement, though still itching Benadryl ordered   Pt observed for several hrs in the ED, feels improved and requesting d/c home She has one epipen at home, will re-prescribe Discussed strict return precautions    MDM  Nursing notes reviewed and considered in documentation Previous records reviewed and considered      I personally performed the services described in this  documentation, which was scribed in my presence. The recorded information has been reviewed and considered.        Sheri Gaskins, MD 07/13/11 [phone]

## 2011-07-14 MED FILL — Diphenhydramine HCl Inj 50 MG/ML: INTRAMUSCULAR | Qty: 1 | Status: AC

## 2011-09-17 ENCOUNTER — Encounter (HOSPITAL_COMMUNITY): Payer: Self-pay | Admitting: Emergency Medicine

## 2011-09-17 ENCOUNTER — Emergency Department (HOSPITAL_COMMUNITY): Payer: Medicare Other

## 2011-09-17 ENCOUNTER — Emergency Department (HOSPITAL_COMMUNITY)
Admission: EM | Admit: 2011-09-17 | Discharge: 2011-09-17 | Disposition: A | Discharge status: Home / Self Care | Payer: Medicare Other | Attending: Emergency Medicine | Admitting: Emergency Medicine

## 2011-09-17 DIAGNOSIS — L299 Pruritus, unspecified: Secondary | ICD-10-CM | POA: Insufficient documentation

## 2011-09-17 DIAGNOSIS — X58XXXA Exposure to other specified factors, initial encounter: Secondary | ICD-10-CM | POA: Insufficient documentation

## 2011-09-17 DIAGNOSIS — R0602 Shortness of breath: Secondary | ICD-10-CM | POA: Insufficient documentation

## 2011-09-17 DIAGNOSIS — T7840XA Allergy, unspecified, initial encounter: Secondary | ICD-10-CM | POA: Insufficient documentation

## 2011-09-17 DIAGNOSIS — R079 Chest pain, unspecified: Secondary | ICD-10-CM | POA: Insufficient documentation

## 2011-09-17 DIAGNOSIS — I10 Essential (primary) hypertension: Secondary | ICD-10-CM | POA: Insufficient documentation

## 2011-09-17 DIAGNOSIS — R112 Nausea with vomiting, unspecified: Secondary | ICD-10-CM | POA: Insufficient documentation

## 2011-09-17 DIAGNOSIS — Z79899 Other long term (current) drug therapy: Secondary | ICD-10-CM | POA: Insufficient documentation

## 2011-09-17 LAB — TROPONIN I: Troponin I: <0.3 ng/mL (ref <0.30)

## 2011-09-17 LAB — COMPREHENSIVE METABOLIC PANEL
AST: 26 U/L (ref 0–37)
Albumin: 3.9 g/dL (ref 3.5–5.2)
Alkaline Phosphatase: 82 U/L (ref 39–117)
Chloride: 101 mEq/L (ref 96–112)
Creatinine, Ser: 0.55 mg/dL (ref 0.50–1.10)
Potassium: 3.3 mEq/L — ABNORMAL LOW (ref 3.5–5.1)
Sodium: 140 mEq/L (ref 135–145)
Total Bilirubin: 0.3 mg/dL (ref 0.3–1.2)

## 2011-09-17 LAB — CBC
Platelets: 360 10*3/uL (ref 150–400)
RBC: 4.28 MIL/uL (ref 3.87–5.11)
WBC: 11 10*3/uL — ABNORMAL HIGH (ref 4.0–10.5)

## 2011-09-17 LAB — DIFFERENTIAL
Eosinophils Relative: 4 % (ref 0–5)
Lymphocytes Relative: 57 % — ABNORMAL HIGH (ref 12–46)
Monocytes Relative: 6 % (ref 3–12)
Neutrophils Relative %: 33 % — ABNORMAL LOW (ref 43–77)

## 2011-09-17 LAB — POCT I-STAT TROPONIN I

## 2011-09-17 MED ORDER — POTASSIUM CHLORIDE CRYS ER 20 MEQ PO TBCR
40.0000 meq | EXTENDED_RELEASE_TABLET | Freq: Once | ORAL | Status: AC
  Administered 2011-09-17: 40 meq via ORAL
  Filled 2011-09-17 (×2): qty 2

## 2011-09-17 MED ORDER — DIPHENHYDRAMINE HCL 25 MG PO CAPS
ORAL_CAPSULE | ORAL | Status: AC
  Administered 2011-09-17: 02:00:00
  Filled 2011-09-17: qty 2

## 2011-09-17 MED ORDER — EPINEPHRINE 0.3 MG/0.3ML IJ DEVI: 0.3000 mg | INTRAMUSCULAR | Status: DC | PRN

## 2011-09-17 MED ORDER — METHYLPREDNISOLONE SODIUM SUCC 125 MG IJ SOLR
INTRAMUSCULAR | Status: AC
  Administered 2011-09-17: 125 mg via INTRAVENOUS
  Filled 2011-09-17: qty 2

## 2011-09-17 MED ORDER — SODIUM CHLORIDE 0.9 % IV BOLUS (SEPSIS)
1000.0000 mL | Freq: Once | INTRAVENOUS | Status: AC
  Administered 2011-09-17: 1000 mL via INTRAVENOUS

## 2011-09-17 MED ORDER — HYDROXYZINE HCL 25 MG PO TABS
25.0000 mg | ORAL_TABLET | Freq: Once | ORAL | Status: AC
  Administered 2011-09-17: 25 mg via ORAL
  Filled 2011-09-17: qty 1

## 2011-09-17 MED ORDER — ONDANSETRON HCL 4 MG/2ML IJ SOLN
INTRAMUSCULAR | Status: AC
  Administered 2011-09-17: 02:00:00
  Filled 2011-09-17: qty 2

## 2011-09-17 MED ORDER — FAMOTIDINE IN NACL 20-0.9 MG/50ML-% IV SOLN
20.0000 mg | Freq: Once | INTRAVENOUS | Status: AC
  Administered 2011-09-17: 20 mg via INTRAVENOUS

## 2011-09-17 MED ORDER — METHYLPREDNISOLONE SODIUM SUCC 125 MG IJ SOLR
125.0000 mg | Freq: Once | INTRAMUSCULAR | Status: AC
  Administered 2011-09-17: 125 mg via INTRAVENOUS

## 2011-09-17 MED ORDER — HYDROXYZINE HCL 25 MG PO TABS: 25.0000 mg | ORAL_TABLET | Freq: Three times a day (TID) | ORAL | Status: AC | PRN

## 2011-09-17 MED ORDER — DIPHENHYDRAMINE HCL 50 MG/ML IJ SOLN
INTRAMUSCULAR | Status: AC
  Administered 2011-09-17: 02:00:00
  Filled 2011-09-17: qty 1

## 2011-09-17 MED ORDER — FAMOTIDINE IN NACL 20-0.9 MG/50ML-% IV SOLN
INTRAVENOUS | Status: AC
  Administered 2011-09-17: 20 mg via INTRAVENOUS
  Filled 2011-09-17: qty 50

## 2011-09-17 MED ORDER — ONDANSETRON HCL 4 MG/2ML IJ SOLN
4.0000 mg | Freq: Once | INTRAMUSCULAR | Status: AC
  Administered 2011-09-17: 4 mg via INTRAVENOUS

## 2011-09-17 NOTE — ED Notes (Signed)
Per EMS, pt ate potatoe chips earlier today. She has been having generalized itching all yester. She went to bed yesterday and woke up having SOB. Husband gave her Epipen at 60 and gave 100mg  of oral Benadryl. Pt vomited medication. Then EMS gave 50mg  of Benadryl PO and she vomited. The pt got 50mg  IV Benadryl and 4mg  Zofran IV. Pt still complaining of nausea. 18g LAC. Received 500 normal saline. Vitals: 112, 138/90, 100% on 2L O2 N/C. Allergies Vicodin, Prestique, Neurotin, NSAIDS, PCN, ASA, Latex, Food Allergies and Seasonal Allergy.

## 2011-09-17 NOTE — ED Provider Notes (Signed)
History     CSN: [id number]  Arrival date & time 09/17/11  0137   First MD Initiated Contact with Patient 09/17/11 0142      Chief Complaint  Patient presents with  . Allergic Reaction    (Consider location/radiation/quality/duration/timing/severity/associated sxs/prior treatment) HPI Patient is a 46 year old female who presents this evening by EMS for evaluation of chest discomfort and difficulty breathing as well as pruritus. EMS was contacted by the patient's husband following acute worsening of her ability to breathe as well as chest discomfort. Patient has history of significant allergies and he administered EpiPen at home. Patient and husband can think of no possible exposure that precipitated this other than that the patient ate potato chips earlier today. She has had generalized itching since that time. This also was present yesterday. Patient woke up short of breath this morning and then acutely worsened at 12:42 AM. Prior to EpiPen administration family had tried providing Benadryl orally. patient vomited this up. EMS also attempted to give Benadryl which patient also vomited. She then was given 50 mg of IV Benadryl and 4 mg of Zofran IV in route to the hospital. She received a total of 500 cc of normal saline. Upon arrival patient reports 5/10 chest pressure, shortness of breath,) this. She is able to speak in full sentences. She has no history of coronary artery disease, hyperlipidemia, or diabetes but does have a history of hypertension. There are no other associated or modifying factors.  Past Medical History  Diagnosis Date  . Tendonitis   . Obesity   . Hypertension     Past Surgical History  Procedure Date  . Cesarean section   . Abdominal hysterectomy   . Bowel resection   . Knee surgery     History reviewed. No pertinent family history.  History  Substance Use Topics  . Smoking status: Passive Smoker  . Smokeless tobacco: Never Used  . Alcohol Use: No    OB  History    Grav Para Term Preterm Abortions TAB SAB Ect Mult Living                  Review of Systems  Constitutional: Negative.   HENT: Negative.   Eyes: Negative.   Respiratory: Positive for chest tightness and shortness of breath.   Cardiovascular: Positive for chest pain.  Gastrointestinal: Positive for nausea and vomiting.  Genitourinary: Negative.   Musculoskeletal: Negative.   Skin:       Pruritus  Neurological: Negative.   Hematological: Negative.   Psychiatric/Behavioral: Negative.   All other systems reviewed and are negative.    Allergies  Banana; Latex; Onion; Codeine; Hydrocodone; and Penicillins  Home Medications   Current Outpatient Rx  Name Route Sig Dispense Refill  . ALBUTEROL SULFATE (5 MG/ML) 0.5% IN NEBU Nebulization Take 5 mg by nebulization 2 (two) times daily.     Marland Kitchen CLONAZEPAM 1 MG PO TABS Oral Take 1 mg by mouth at bedtime.    Marland Kitchen EPINEPHRINE 0.3 MG/0.3ML IJ DEVI Intramuscular Inject 0.3 mg into the muscle daily as needed. For severe allergic reaction    . FLUTICASONE PROPIONATE 50 MCG/ACT NA SUSP Nasal Place 2 sprays into the nose daily.    . MULTI-VITAMIN/MINERALS PO TABS Oral Take 1 tablet by mouth daily.    Marland Kitchen CALCIUM POLYCARBOPHIL 625 MG PO TABS Oral Take 625 mg by mouth daily.    Marland Kitchen ZANTAC IJ Injection Inject 50 mg as directed once.    Marland Kitchen TEMAZEPAM 7.5 MG PO  CAPS Oral Take 7.5 mg by mouth at bedtime as needed. For insomnia    . DIPHENHYDRAMINE HCL 25 MG PO TABS Oral Take 50 mg by mouth once.    Marland Kitchen EPINEPHRINE 0.3 MG/0.3ML IJ DEVI Intramuscular Inject 0.3 mLs (0.3 mg total) into the muscle as needed. 1 Device 1  . HYDROXYZINE HCL 25 MG PO TABS Oral Take 1 tablet (25 mg total) by mouth every 8 (eight) hours as needed for itching. 30 tablet 0    BP 126/94  Pulse 96  Temp 97.9 F (36.6 C) (Oral)  Resp 26  SpO2 99%  Physical Exam  Nursing note and vitals reviewed. GEN: Well-developed, well-nourished female in no distress HEENT: Atraumatic,  normocephalic. Oropharynx clear without erythema EYES: PERRLA BL, no scleral icterus. NECK: Trachea midline, no meningismus CV: Tachycardic with regular rhythm. No murmurs, rubs, or gallops PULM: Tachypnea. No crackles, wheezes, or rales. GI: soft, non-tender. No guarding, rebound, or tenderness. + bowel sounds  GU: deferred Neuro: cranial nerves grossly 2-12 intact, no abnormalities of strength or sensation, A and O x 3 MSK: Patient moves all 4 extremities symmetrically, no deformity, edema, or injury noted Skin: No rashes petechiae, purpura, or jaundice. Patient has numerous areas of excoriation from her pruritus Psych: no abnormality of mood   ED Course  Procedures (including critical care time)  Indication: chest pain Please note this EKG was reviewed extemporaneously by myself.  Date: 09/17/2011  Rate: 109  Rhythm: sinus tachycardia  QRS Axis: normal  Intervals: normal  ST/T Wave abnormalities: nonspecific T wave changes  Conduction Disutrbances:none  Narrative Interpretation: QRS morphology somewhat altered likely due to lead placement.  Old EKG Reviewed: changes noted      Labs Reviewed  CBC - Abnormal; Notable for the following:    WBC 11.0 (*)     All other components within normal limits  DIFFERENTIAL - Abnormal; Notable for the following:    Neutrophils Relative 33 (*)     Lymphocytes Relative 57 (*)     Lymphs Abs 6.3 (*)     All other components within normal limits  COMPREHENSIVE METABOLIC PANEL - Abnormal; Notable for the following:    Potassium 3.3 (*)     Glucose, Bld 118 (*)     BUN 5 (*)     All other components within normal limits  TROPONIN I  POCT I-STAT TROPONIN I  PATHOLOGIST SMEAR REVIEW   Dg Chest Port 1 View  09/17/2011  *RADIOLOGY REPORT*  Clinical Data: Shortness of breath.  Allergic reaction.  PORTABLE CHEST - 1 VIEW  Comparison: 01/05/2010  Findings: Midline trachea.  Borderline cardiomegaly.  This is accentuated by low lung volumes  and AP portable technique. No pleural effusion or pneumothorax.  No congestive failure.  Clear lungs.  IMPRESSION: Low lung volumes without acute disease.  Original Report Authenticated By: Consuello Bossier, M.D.     1. Allergic reaction       MDM  Patient was evaluated by myself. Initial therapy consisted of further treatment for possible allergic reaction. This included Solu-Medrol 125 mg IV as well as Pepcid 20 mg IV. Patient had received Benadryl and epinephrine prior to arrival as well as Zofran. IV fluids were given as you're given patient tachycardia. Patient also reported chest pressure. In the setting of epinephrine administration this was also particularly concerning. EKG was performed and showed axis deviation compared to previous but no acute ischemic changes. Chest x-ray was unremarkable. Laboratory workup showed slight leukocytosis but no  anemia. Potassium was slightly lower than usual but otherwise renal panel was unremarkable. Potassium was replaced orally. Troponins were checked at 0 and 3 hours and were both negative. Patient continued to have pruritus and was given hydroxyzine. With this she had significant improvement. Patient had resolution of her chest discomfort as well as her other symptoms. She was provided with a prescription for EpiPen as well as hydroxyzine. She was discharged in good condition with instructions to followup with her allergist and primary care physician. The patient did have changes in her EKG this may have been just secondary to lead placement. She can followup with her primary care physician regarding this issue.        Sheri Numbers, MD 09/18/11 [phone]

## 2011-09-17 NOTE — Discharge Instructions (Signed)
Epinephrine Injection Epinephrine is a medicine given by injection to temporarily treat an emergency allergic reaction. It is also used to treat severe asthmatic attacks and other lung problems. The medicine helps to enlarge (dilate) the small breathing tubes of the lungs. A life-threatening, sudden allergic reaction that involves the whole body is called anaphylaxis. Because of potential side effects, epinephrine should only be used as directed by your caregiver. RISKS AND COMPLICATIONS Possible side effects of epinephrine injections include:  Chest pain.   Irregular or rapid heartbeat.   Shortness of breath.   Nausea.   Vomiting.   Abdominal pain or cramping.   Sweating.   Dizziness.   Weakness.   Headache.   Nervousness.  Report all side effects to your caregiver. HOW TO GIVE AN EPINEPHRINE INJECTION Give the epinephrine injection immediately when symptoms of a severe reaction begin. Inject the medicine into the outer thigh or any available, large muscle. Your caregiver can teach you how to do this. You do not need to remove any clothing. After the injection, call your local emergency services (911 in [place] Even if you improve after the injection, you need to be examined at a hospital emergency department. Epinephrine works quickly, but it also wears off quickly. Delayed reactions can occur. A delayed reaction may be as serious and dangerous as the initial reaction. HOME CARE INSTRUCTIONS  Make sure you and your family know how to give an epinephrine injection.   Use epinephrine injections as directed by your caregiver. Do not use this medicine more often or in larger doses than prescribed.   Always carry your epinephrine injection or anaphylaxis kit with you. This can be lifesaving if you have a severe reaction.   Store the medicine in a cool, dry place. If the medicine becomes discolored or cloudy, dispose of it properly and replace it with new medicine.   Check the  expiration date on your medicine. It may be unsafe to use medicines past their expiration date.   Tell your caregiver about any other medicines you are taking. Some medicines can react badly with epinephrine.   Tell your caregiver about any medical conditions you have, such as diabetes, high blood pressure (hypertension), heart disease, irregular heartbeats, or if you are pregnant.  SEEK IMMEDIATE MEDICAL CARE IF:  You have used an epinephrine injection. Call your local emergency services (911 in [place] Even if you improve after the injection, you need to be examined at a hospital emergency department to make sure your allergic reaction is under control. You will also be monitored for adverse effects from the medicine.   You have chest pain.   You have irregular or fast heartbeats.   You have shortness of breath.   You have severe headaches.   You have severe nausea, vomiting, or abdominal cramps.   You have severe pain, swelling, or redness in the area where you gave the injection.  Document Released: 03/20/2000 Document Revised: 03/12/2011 Document Reviewed: 12/10/2010 [facility] Patient Information 2012 [place], [place].

## 2011-12-16 DIAGNOSIS — T6391XA Toxic effect of contact with unspecified venomous animal, accidental (unintentional), initial encounter: Secondary | ICD-10-CM

## 2011-12-16 HISTORY — DX: Toxic effect of contact with unspecified venomous animal, accidental (unintentional), initial encounter: T63.91XA

## 2012-02-10 ENCOUNTER — Other Ambulatory Visit (HOSPITAL_COMMUNITY): Payer: Self-pay | Admitting: Family Medicine

## 2012-02-10 DIAGNOSIS — Z1231 Encounter for screening mammogram for malignant neoplasm of breast: Secondary | ICD-10-CM

## 2012-03-01 ENCOUNTER — Ambulatory Visit (HOSPITAL_COMMUNITY): Payer: Medicare Other | Attending: Family Medicine

## 2012-03-23 ENCOUNTER — Ambulatory Visit (HOSPITAL_COMMUNITY): Payer: Medicare Other | Attending: Family Medicine

## 2012-11-26 ENCOUNTER — Encounter (HOSPITAL_COMMUNITY): Payer: Self-pay | Admitting: *Deleted

## 2012-11-26 ENCOUNTER — Emergency Department (HOSPITAL_COMMUNITY): Payer: Medicare Other

## 2012-11-26 ENCOUNTER — Inpatient Hospital Stay (HOSPITAL_COMMUNITY)
Admission: EM | Admit: 2012-11-26 | Discharge: 2012-11-29 | DRG: 103 | Disposition: A | Discharge status: Home / Self Care | Payer: Medicare Other | Attending: Internal Medicine | Admitting: Internal Medicine

## 2012-11-26 ENCOUNTER — Emergency Department (HOSPITAL_COMMUNITY)
Admission: EM | Admit: 2012-11-26 | Discharge: 2012-11-26 | Discharge status: Against Medical Advice | Payer: Medicare Other | Attending: Emergency Medicine | Admitting: Emergency Medicine

## 2012-11-26 DIAGNOSIS — M549 Dorsalgia, unspecified: Secondary | ICD-10-CM | POA: Diagnosis present

## 2012-11-26 DIAGNOSIS — I10 Essential (primary) hypertension: Secondary | ICD-10-CM | POA: Diagnosis present

## 2012-11-26 DIAGNOSIS — R51 Headache: Secondary | ICD-10-CM

## 2012-11-26 DIAGNOSIS — R279 Unspecified lack of coordination: Secondary | ICD-10-CM | POA: Diagnosis present

## 2012-11-26 DIAGNOSIS — M25569 Pain in unspecified knee: Secondary | ICD-10-CM | POA: Diagnosis present

## 2012-11-26 DIAGNOSIS — R29898 Other symptoms and signs involving the musculoskeletal system: Secondary | ICD-10-CM | POA: Diagnosis present

## 2012-11-26 DIAGNOSIS — R269 Unspecified abnormalities of gait and mobility: Secondary | ICD-10-CM | POA: Diagnosis present

## 2012-11-26 DIAGNOSIS — Z823 Family history of stroke: Secondary | ICD-10-CM

## 2012-11-26 DIAGNOSIS — H53469 Homonymous bilateral field defects, unspecified side: Secondary | ICD-10-CM | POA: Diagnosis present

## 2012-11-26 DIAGNOSIS — K219 Gastro-esophageal reflux disease without esophagitis: Secondary | ICD-10-CM | POA: Diagnosis present

## 2012-11-26 DIAGNOSIS — R079 Chest pain, unspecified: Secondary | ICD-10-CM | POA: Diagnosis present

## 2012-11-26 DIAGNOSIS — R2990 Unspecified symptoms and signs involving the nervous system: Secondary | ICD-10-CM | POA: Diagnosis present

## 2012-11-26 DIAGNOSIS — F329 Major depressive disorder, single episode, unspecified: Secondary | ICD-10-CM | POA: Diagnosis present

## 2012-11-26 DIAGNOSIS — G43109 Migraine with aura, not intractable, without status migrainosus: Principal | ICD-10-CM | POA: Diagnosis present

## 2012-11-26 DIAGNOSIS — R29818 Other symptoms and signs involving the nervous system: Secondary | ICD-10-CM

## 2012-11-26 DIAGNOSIS — R202 Paresthesia of skin: Secondary | ICD-10-CM

## 2012-11-26 DIAGNOSIS — Z6837 Body mass index (BMI) 37.0-37.9, adult: Secondary | ICD-10-CM

## 2012-11-26 DIAGNOSIS — R209 Unspecified disturbances of skin sensation: Secondary | ICD-10-CM | POA: Diagnosis present

## 2012-11-26 DIAGNOSIS — R519 Headache, unspecified: Secondary | ICD-10-CM | POA: Diagnosis present

## 2012-11-26 DIAGNOSIS — F411 Generalized anxiety disorder: Secondary | ICD-10-CM | POA: Diagnosis present

## 2012-11-26 DIAGNOSIS — E1159 Type 2 diabetes mellitus with other circulatory complications: Secondary | ICD-10-CM | POA: Diagnosis present

## 2012-11-26 DIAGNOSIS — F3289 Other specified depressive episodes: Secondary | ICD-10-CM | POA: Diagnosis present

## 2012-11-26 DIAGNOSIS — E669 Obesity, unspecified: Secondary | ICD-10-CM | POA: Diagnosis present

## 2012-11-26 LAB — URINALYSIS, ROUTINE W REFLEX MICROSCOPIC
Bilirubin Urine: NEGATIVE
Glucose, UA: NEGATIVE mg/dL
Ketones, ur: NEGATIVE mg/dL
Specific Gravity, Urine: 1.021 (ref 1.005–1.030)
pH: 6.5 (ref 5.0–8.0)

## 2012-11-26 LAB — GLUCOSE, CAPILLARY: Glucose-Capillary: 111 mg/dL — ABNORMAL HIGH (ref 70–99)

## 2012-11-26 LAB — CBC WITH DIFFERENTIAL/PLATELET
Eosinophils Relative: 2 % (ref 0–5)
HCT: 41.8 % (ref 36.0–46.0)
Hemoglobin: 14.2 g/dL (ref 12.0–15.0)
Lymphocytes Relative: 46 % (ref 12–46)
Lymphs Abs: 3.4 10*3/uL (ref 0.7–4.0)
MCV: 92.7 fL (ref 78.0–100.0)
Monocytes Absolute: 0.3 10*3/uL (ref 0.1–1.0)
RBC: 4.51 MIL/uL (ref 3.87–5.11)
WBC: 7.4 10*3/uL (ref 4.0–10.5)

## 2012-11-26 LAB — BASIC METABOLIC PANEL
CO2: 28 mEq/L (ref 19–32)
Calcium: 9.3 mg/dL (ref 8.4–10.5)
Chloride: 105 mEq/L (ref 96–112)
Glucose, Bld: 99 mg/dL (ref 70–99)
Potassium: 3.9 mEq/L (ref 3.5–5.1)
Sodium: 143 mEq/L (ref 135–145)

## 2012-11-26 LAB — URINE MICROSCOPIC-ADD ON

## 2012-11-26 LAB — POCT I-STAT TROPONIN I: Troponin i, poc: 0.01 ng/mL (ref 0.00–0.08)

## 2012-11-26 MED ORDER — NITROGLYCERIN 0.4 MG SL SUBL
0.4000 mg | SUBLINGUAL_TABLET | SUBLINGUAL | Status: DC | PRN
  Administered 2012-11-26: 0.4 mg via SUBLINGUAL
  Filled 2012-11-26: qty 25

## 2012-11-26 MED ORDER — ASPIRIN 325 MG PO TABS
325.0000 mg | ORAL_TABLET | Freq: Once | ORAL | Status: AC
  Administered 2012-11-26: 325 mg via ORAL
  Filled 2012-11-26: qty 1

## 2012-11-26 MED ORDER — BUDESONIDE-FORMOTEROL FUMARATE 160-4.5 MCG/ACT IN AERO
2.0000 | INHALATION_SPRAY | Freq: Two times a day (BID) | RESPIRATORY_TRACT | Status: DC
  Administered 2012-11-27 – 2012-11-29 (×5): 2 via RESPIRATORY_TRACT
  Filled 2012-11-26: qty 6

## 2012-11-26 MED ORDER — TEMAZEPAM 7.5 MG PO CAPS
7.5000 mg | ORAL_CAPSULE | Freq: Every evening | ORAL | Status: DC | PRN
  Administered 2012-11-26 – 2012-11-28 (×3): 7.5 mg via ORAL
  Filled 2012-11-26 (×3): qty 1

## 2012-11-26 MED ORDER — ONDANSETRON HCL 4 MG PO TABS
4.0000 mg | ORAL_TABLET | Freq: Once | ORAL | Status: AC
  Administered 2012-11-26: 4 mg via ORAL
  Filled 2012-11-26: qty 1

## 2012-11-26 MED ORDER — CLONAZEPAM 1 MG PO TABS
1.0000 mg | ORAL_TABLET | Freq: Every day | ORAL | Status: DC
  Administered 2012-11-26 – 2012-11-28 (×3): 1 mg via ORAL
  Filled 2012-11-26 (×6): qty 1

## 2012-11-26 MED ORDER — ALBUTEROL SULFATE (5 MG/ML) 0.5% IN NEBU
5.0000 mg | INHALATION_SOLUTION | Freq: Two times a day (BID) | RESPIRATORY_TRACT | Status: DC
  Administered 2012-11-26: 2.5 mg via RESPIRATORY_TRACT
  Administered 2012-11-27: 5 mg via RESPIRATORY_TRACT
  Filled 2012-11-26: qty 1
  Filled 2012-11-26: qty 0.5

## 2012-11-26 MED ORDER — IBUPROFEN 600 MG PO TABS
600.0000 mg | ORAL_TABLET | Freq: Once | ORAL | Status: AC
  Administered 2012-11-26: 600 mg via ORAL
  Filled 2012-11-26: qty 1

## 2012-11-26 MED ORDER — ONDANSETRON HCL 4 MG/2ML IJ SOLN
4.0000 mg | Freq: Once | INTRAMUSCULAR | Status: DC
  Filled 2012-11-26 (×2): qty 2

## 2012-11-26 MED ORDER — FLUTICASONE PROPIONATE 50 MCG/ACT NA SUSP
2.0000 | Freq: Every day | NASAL | Status: DC
  Administered 2012-11-28 – 2012-11-29 (×2): 2 via NASAL
  Filled 2012-11-26: qty 16

## 2012-11-26 MED ORDER — ASPIRIN 81 MG PO CHEW
81.0000 mg | CHEWABLE_TABLET | Freq: Every day | ORAL | Status: DC
  Administered 2012-11-27 – 2012-11-28 (×2): 81 mg via ORAL
  Filled 2012-11-26 (×2): qty 1

## 2012-11-26 NOTE — ED Notes (Signed)
Pt reports left sided CP onset yesterday. Pt reports shortness of breath and headache. No edema noted. Pt reports numbness and tingling to left arm.

## 2012-11-26 NOTE — ED Notes (Signed)
Pt was triaged on arrival by another nurse and patient answered yes that she was another patient and did not understand why they were calling her something else.  Pt is here with complaints of chest pain, left sided numbness, and headache.

## 2012-11-26 NOTE — ED Notes (Signed)
MD at bedside. 

## 2012-11-26 NOTE — ED Provider Notes (Signed)
CSN: [id number]     Arrival date & time 11/26/12  1527 History     First MD Initiated Contact with Patient 11/26/12 1624     Chief Complaint  Patient presents with  . Chest Pain  . Numbness   (Consider location/radiation/quality/duration/timing/severity/associated sxs/prior Treatment) HPI Comments: Pt reports about 23 hours of h/a, chest tightness, L arm paresthesias and weakness.  She also believes she had L sided facial droop which has since improved. She has had cough for about 2 weeks w/ scant sputum.  She has had inc SOb from baseline.   Patient is a 47 y.o. female presenting with chest pain and neurologic complaint. The history is provided by the patient. No language interpreter was used.  Chest Pain Pain location:  Substernal area and L chest Pain quality: pressure and tightness   Pain radiates to:  Upper back Pain radiates to the back: yes   Pain severity:  Moderate Onset quality:  Sudden Duration:  23 hours Timing:  Constant Progression:  Unchanged Chronicity:  New Context: at rest   Relieved by:  Leaning forward Worsened by:  Movement and certain positions Ineffective treatments:  None tried Associated symptoms: back pain, cough, headache, nausea, numbness and weakness   Associated symptoms: no abdominal pain, no diaphoresis, no dizziness, no fatigue, no fever, no palpitations, no shortness of breath and not vomiting   Weakness:    Severity:  Moderate   Onset quality:  Sudden   Duration:  23 hours   Chronicity:  New   Timing:  Constant   Progression:  Unchanged Risk factors: hypertension and obesity   Neurologic Problem This is a new problem. The current episode started 12 to 24 hours ago. The problem occurs constantly. The problem has not changed since onset.Associated symptoms include chest pain and headaches. Pertinent negatives include no abdominal pain and no shortness of breath. Nothing aggravates the symptoms. Nothing relieves the symptoms. She has tried  nothing for the symptoms. The treatment provided no relief.    Past Medical History  Diagnosis Date  . Tendonitis   . Obesity   . Hypertension    Past Surgical History  Procedure Laterality Date  . Cesarean section    . Abdominal hysterectomy    . Bowel resection    . Knee surgery     No family history on file. History  Substance Use Topics  . Smoking status: Passive Smoke Exposure - Never Smoker  . Smokeless tobacco: Never Used  . Alcohol Use: No   OB History   Grav Para Term Preterm Abortions TAB SAB Ect Mult Living                 Review of Systems  Constitutional: Negative for fever, chills, diaphoresis, activity change, appetite change and fatigue.  HENT: Negative for congestion, sore throat, facial swelling, rhinorrhea, neck pain and neck stiffness.   Eyes: Negative for photophobia and discharge.  Respiratory: Positive for cough. Negative for chest tightness and shortness of breath.   Cardiovascular: Positive for chest pain. Negative for palpitations and leg swelling.  Gastrointestinal: Positive for nausea. Negative for vomiting, abdominal pain and diarrhea.  Endocrine: Negative for polydipsia and polyuria.  Genitourinary: Negative for dysuria, frequency, difficulty urinating and pelvic pain.  Musculoskeletal: Positive for back pain. Negative for arthralgias.  Skin: Negative for color change and wound.  Allergic/Immunologic: Negative for immunocompromised state.  Neurological: Positive for weakness, numbness and headaches. Negative for dizziness and facial asymmetry.  Hematological: Does not bruise/bleed easily.  Psychiatric/Behavioral: Negative for confusion and agitation.    Allergies  Banana; Latex; Onion; Codeine; Hydrocodone; and Penicillins  Home Medications   No current outpatient prescriptions on file. BP 123/78  Pulse 74  Temp(Src) 98 F (36.7 C)  Resp 18  Ht 4\' 11"  (1.499 m)  Wt 184 lb (83.462 kg)  BMI 37.14 kg/m2  SpO2 100% Physical Exam   Constitutional: She is oriented to person, place, and time. She appears well-developed and well-nourished. No distress.  HENT:  Head: Normocephalic and atraumatic.  Mouth/Throat: No oropharyngeal exudate.  Eyes: Pupils are equal, round, and reactive to light.  Neck: Normal range of motion. Neck supple.  Cardiovascular: Normal rate, regular rhythm and normal heart sounds.  Exam reveals no gallop and no friction rub.   No murmur heard. Pulmonary/Chest: Effort normal and breath sounds normal. No respiratory distress. She has no wheezes. She has no rales. She exhibits tenderness.  Abdominal: Soft. Bowel sounds are normal. She exhibits no distension and no mass. There is no tenderness. There is no rebound and no guarding.  Musculoskeletal: Normal range of motion. She exhibits no edema and no tenderness.  Neurological: She is alert and oriented to person, place, and time. She displays no tremor. A sensory deficit is present. No cranial nerve deficit. She exhibits normal muscle tone. Coordination and gait normal. GCS eye subscore is 4. GCS verbal subscore is 5. GCS motor subscore is 6.  Pt reports decreased fine touch to LUE  Skin: Skin is warm and dry.  Psychiatric: She has a normal mood and affect.    ED Course   Procedures (including critical care time)  Labs Reviewed  URINALYSIS, ROUTINE W REFLEX MICROSCOPIC - Abnormal; Notable for the following:    Leukocytes, UA TRACE (*)    All other components within normal limits  URINE MICROSCOPIC-ADD ON - Abnormal; Notable for the following:    Squamous Epithelial / LPF FEW (*)    All other components within normal limits  GLUCOSE, CAPILLARY - Abnormal; Notable for the following:    Glucose-Capillary 111 (*)    All other components within normal limits  MRSA PCR SCREENING  BASIC METABOLIC PANEL  PRO B NATRIURETIC PEPTIDE  CBC WITH DIFFERENTIAL  LIPID PANEL  URINE RAPID DRUG SCREEN (HOSP PERFORMED)  HEMOGLOBIN A1C  POCT I-STAT TROPONIN I    Dg Chest 2 View  11/26/2012   *RADIOLOGY REPORT*  Clinical Data: Chest pain, left arm numbness, history hypertension  CHEST - 2 VIEW  Comparison: 09/17/2010  Findings: Minimal enlargement of cardiac silhouette. Mediastinal contours and pulmonary vascularity normal. Lungs clear. No pleural effusion or pneumothorax. Biconvex thoracolumbar scoliosis.  IMPRESSION: Minimal enlargement of cardiac silhouette. No acute abnormalities.   Original Report Authenticated By: Ulyses Southward, M.D.   Ct Head Wo Contrast  11/26/2012   *RADIOLOGY REPORT*  Clinical Data:  Frontal headache.  Left facial droop occurring yesterday.  CT HEAD WITHOUT CONTRAST  Technique:  Contiguous axial images were obtained from the base of the skull through the vertex without contrast  Comparison:  MR head 05/22/2008.  CT head 11/02/2006.  Findings:  The brain has a normal appearance without evidence for hemorrhage, acute infarction, hydrocephalus, or mass lesion.  There is no extra axial fluid collection.  The skull and paranasal sinuses are normal. No change from priors.  IMPRESSION: Normal CT of the head without contrast.   Original Report Authenticated By: Davonna Belling, M.D.   1. Chest pain   2. Paresthesia   3. Stroke-like symptom  Date: 11/26/2012  Rate: 82   Rhythm: normal sinus rhythm  QRS Axis: left  Intervals: normal  ST/T Wave abnormalities: nonspecific T wave changes  Conduction Disutrbances:none  Narrative Interpretation:   Old EKG Reviewed: changes noted,new TWI III, aVF    MDM  Pt is a 47 y.o. female with Pmhx as above who presents with about 23 hours of h/a, chest tightness, L arm paresthesias.  She also reports she felt like she had L facial droop yesterday which resolved.  She had also had mild cough & says COPD symptoms worse than usual. EKG w/ new TWI III, aVF.  Trop not elevated, CT head unremarkable, BNP not elevated, CBC, BMP also unremarkable.  Given new EKG changes and concern for possible TIA, medicine  consulted for admission for ACS/TIA w/u.    1. Chest pain   2. Paresthesia   3. Stroke-like symptom         Shanna Cisco, MD 11/26/12 2328

## 2012-11-27 ENCOUNTER — Inpatient Hospital Stay (HOSPITAL_COMMUNITY): Payer: Medicare Other

## 2012-11-27 DIAGNOSIS — R079 Chest pain, unspecified: Secondary | ICD-10-CM

## 2012-11-27 DIAGNOSIS — I6789 Other cerebrovascular disease: Secondary | ICD-10-CM

## 2012-11-27 LAB — LIPID PANEL
Cholesterol: 173 mg/dL (ref 0–200)
Total CHOL/HDL Ratio: 3.5 RATIO
Triglycerides: 128 mg/dL (ref <150)

## 2012-11-27 LAB — RAPID URINE DRUG SCREEN, HOSP PERFORMED
Barbiturates: NOT DETECTED
Benzodiazepines: POSITIVE — AB

## 2012-11-27 LAB — TROPONIN I: Troponin I: <0.3 ng/mL (ref <0.30)

## 2012-11-27 LAB — MRSA PCR SCREENING: MRSA by PCR: NEGATIVE

## 2012-11-27 LAB — GLUCOSE, CAPILLARY: Glucose-Capillary: 155 mg/dL — ABNORMAL HIGH (ref 70–99)

## 2012-11-27 LAB — HEMOGLOBIN A1C: Mean Plasma Glucose: 117 mg/dL — ABNORMAL HIGH (ref <117)

## 2012-11-27 MED ORDER — ALBUTEROL SULFATE (5 MG/ML) 0.5% IN NEBU: 2.5000 mg | INHALATION_SOLUTION | Freq: Four times a day (QID) | RESPIRATORY_TRACT | Status: DC | PRN

## 2012-11-27 MED ORDER — ALBUTEROL SULFATE HFA 108 (90 BASE) MCG/ACT IN AERS
2.0000 | INHALATION_SPRAY | Freq: Two times a day (BID) | RESPIRATORY_TRACT | Status: DC
  Administered 2012-11-28: 2 via RESPIRATORY_TRACT
  Filled 2012-11-27: qty 6.7

## 2012-11-27 MED ORDER — KETOROLAC TROMETHAMINE 30 MG/ML IJ SOLN
30.0000 mg | Freq: Once | INTRAMUSCULAR | Status: AC
  Administered 2012-11-27: 30 mg via INTRAVENOUS
  Filled 2012-11-27: qty 1

## 2012-11-27 MED ORDER — PROCHLORPERAZINE EDISYLATE 5 MG/ML IJ SOLN
10.0000 mg | Freq: Four times a day (QID) | INTRAMUSCULAR | Status: DC | PRN
  Administered 2012-11-28 (×2): 10 mg via INTRAVENOUS
  Filled 2012-11-27 (×3): qty 2

## 2012-11-27 MED ORDER — KETOROLAC TROMETHAMINE 30 MG/ML IJ SOLN
30.0000 mg | Freq: Three times a day (TID) | INTRAMUSCULAR | Status: DC | PRN
  Administered 2012-11-27 – 2012-11-29 (×4): 30 mg via INTRAVENOUS
  Filled 2012-11-27 (×4): qty 1

## 2012-11-27 NOTE — Progress Notes (Signed)
Spoke with Dr. Clyde Lundborg about patients diet. She is extremely upset that she is not being allowed to eat. She has passed swallow eval and had MRI finished. Per Dr. Clyde Lundborg he will be putting a diet order for her momentarily. She has been taken a  [place] sandwich and drinks for the time being.

## 2012-11-27 NOTE — H&P (Signed)
Internal Medicine Attending Admission Note Date: 11/27/2012  Patient name: [name] Medical record number: [id number] Date of birth: 23-Jun-1965 Age: 47 y.o. Gender: female  I saw and evaluated the patient. I reviewed the resident's note and I agree with the resident's findings and plan as documented in the resident's note, with the following additional comments.  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  Patient also reports a persisting headache.   Physical Exam - key components related to admission:  Filed Vitals:   11/27/12 0100 11/27/12 0424 11/27/12 0630 11/27/12 0824  BP: 109/71 107/87 112/72   Pulse: 75 71 80   Temp: 98.1 F (36.7 C) 98.2 F (36.8 C) 97.9 F (36.6 C)   TempSrc:  Oral Oral   Resp: 18 18 18    Height:      Weight:      SpO2: 100% 98% 98% 98%   General: Alert, oriented Lungs: Clear Heart: Regular; no extra sounds or murmurs Abdomen: Bowel sounds present, soft, nontender Extremities: No edema Neurological: Mild left upper extremity weakness  Lab results:   Basic Metabolic Panel:  Recent Labs  40/98/11 1710  NA 143  K 3.9  CL 105  CO2 28  GLUCOSE 99  BUN 6  CREATININE 0.70  CALCIUM 9.3    Liver Function Tests: No results found for this basename: AST, ALT, ALKPHOS, BILITOT, PROT, ALBUMIN,  in the last 72 hours No results found for this basename: LIPASE, AMYLASE,  in the last 72 hours No results found for this basename: AMMONIA,  in the last 72 hours  CBC:  Recent Labs   11/26/12 1710  WBC 7.4  HGB 14.2  HCT 41.8  MCV 92.7  PLT 391    Recent Labs  11/26/12 1710  NEUTROABS 3.5  LYMPHSABS 3.4  MONOABS 0.3  EOSABS 0.1  BASOSABS 0.0    Cardiac Enzymes:  Recent Labs  11/27/12 0224 11/27/12 0800  TROPONINI <0.30 <0.30    BNP:  Recent Labs  11/26/12 1710  PROBNP <5.0    CBG:  Recent Labs  11/26/12 2146 11/27/12 0759  GLUCAP 111* 73     Fasting Lipid Panel:  Recent Labs  11/27/12 0500  CHOL 173  HDL 49  LDLCALC 98  TRIG 128  CHOLHDL 3.5     Urinalysis    Component Value Date/Time   COLORURINE YELLOW 11/26/2012 1751   APPEARANCEUR CLEAR 11/26/2012 1751   LABSPEC 1.021 11/26/2012 1751   PHURINE 6.5 11/26/2012 1751   GLUCOSEU NEGATIVE 11/26/2012 1751   HGBUR NEGATIVE 11/26/2012 1751   BILIRUBINUR NEGATIVE 11/26/2012 1751   KETONESUR NEGATIVE 11/26/2012 1751   PROTEINUR NEGATIVE 11/26/2012 1751   UROBILINOGEN 1.0 11/26/2012 1751   NITRITE NEGATIVE 11/26/2012 1751   LEUKOCYTESUR TRACE* 11/26/2012 1751    Urine microscopic:  Recent Labs  11/26/12 1751  EPIU FEW*  WBCU 0-2  BACTERIA RARE     Imaging results:  Dg Chest 2 View  11/26/2012   *RADIOLOGY REPORT*  Clinical Data: Chest pain, left arm numbness, history hypertension  CHEST - 2 VIEW  Comparison: 09/17/2010  Findings: Minimal enlargement of cardiac silhouette. Mediastinal  contours and pulmonary vascularity normal. Lungs clear. No pleural effusion or pneumothorax. Biconvex thoracolumbar scoliosis.  IMPRESSION: Minimal enlargement of cardiac silhouette. No acute abnormalities.   Original Report Authenticated By: Ulyses Southward, M.D.   Ct Head Wo Contrast  11/26/2012   *RADIOLOGY REPORT*  Clinical Data:  Frontal headache.  Left facial droop occurring yesterday.  CT HEAD WITHOUT CONTRAST  Technique:  Contiguous axial images were obtained from the base of the skull through the vertex without contrast  Comparison:  MR head 05/22/2008.  CT head 11/02/2006.  Findings:  The  brain has a normal appearance without evidence for hemorrhage, acute infarction, hydrocephalus, or mass lesion.  There is no extra axial fluid collection.  The skull and paranasal sinuses are normal. No change from priors.  IMPRESSION: Normal CT of the head without contrast.   Original Report Authenticated By: Davonna Belling, M.D.   Mr [facility] Head Wo Contrast  11/27/2012   *RADIOLOGY REPORT*  Clinical Data:  Left-sided paresthesias.  Decreased sensation. Headache. Stroke risk factors include history of hypertension.  MRI HEAD WITHOUT CONTRAST MRA HEAD WITHOUT CONTRAST  Technique:  Multiplanar, multiecho pulse sequences of the brain and surrounding structures were obtained without intravenous contrast. Angiographic images of the head were obtained using MRA technique without contrast.  Comparison:  CT head 11/26/2012.  MRI HEAD  Findings:  No acute stroke or hemorrhage.  No mass lesion or hydrocephalus.  No extra-axial fluid.  Normal cerebral volume.  Minimal nonspecific periventricular white matter signal abnormality, primarily bilateral occipital, possible chronic microvascular ischemic change.  No ventricular enlargement to suggest transependymal absorption.  No brainstem or cerebellar edema to suggest PRES.  Normal calvarium and skull base.  Unremarkable pituitary and cerebellar tonsils.  No upper cervical compressive lesion is evident.  Mild chronic sinus disease.  Negative mastoids and orbits.  Flow voids are maintained throughout the major intracranial vascular structures.  IMPRESSION: Minimal nonspecific periventricular white matter signal abnormality, possible mild small vessel disease.  No acute intracranial findings. No areas of acute infarction within the intracranial pathways subserving sensation.  MRA HEAD  Findings: Internal carotid arteries are widely patent.  Basilar artery is widely patent with the right vertebral as the sole contributor.  Left vertebral ends in PICA.  No intracranial stenosis or  aneurysm.  IMPRESSION: Unremarkable MRA intracranial circulation.   Original Report Authenticated By: Davonna Belling, M.D.   Mr Brain Wo Contrast  11/27/2012   *RADIOLOGY REPORT*  Clinical Data:  Left-sided paresthesias.  Decreased sensation. Headache. Stroke risk factors include history of hypertension.  MRI HEAD WITHOUT CONTRAST MRA HEAD WITHOUT CONTRAST  Technique:  Multiplanar, multiecho pulse sequences of the brain and surrounding structures were obtained without intravenous contrast. Angiographic images of the head were obtained using MRA technique without contrast.  Comparison:  CT head 11/26/2012.  MRI HEAD  Findings:  No acute stroke or hemorrhage.  No mass lesion or hydrocephalus.  No extra-axial fluid.  Normal cerebral volume.  Minimal nonspecific periventricular white matter signal abnormality, primarily bilateral occipital, possible chronic microvascular ischemic change.  No ventricular enlargement to suggest transependymal absorption.  No brainstem or cerebellar edema to suggest PRES.  Normal calvarium and skull base.  Unremarkable pituitary and cerebellar tonsils.  No upper cervical compressive lesion is evident.  Mild chronic sinus disease.  Negative mastoids and orbits.  Flow voids are maintained throughout the major intracranial vascular structures.  IMPRESSION: Minimal nonspecific periventricular white matter signal abnormality, possible mild small vessel disease.  No acute intracranial findings. No  areas of acute infarction within the intracranial pathways subserving sensation.  MRA HEAD  Findings: Internal carotid arteries are widely patent.  Basilar artery is widely patent with the right vertebral as the sole contributor.  Left vertebral ends in PICA.  No intracranial stenosis or aneurysm.  IMPRESSION: Unremarkable MRA intracranial circulation.   Original Report Authenticated By: Davonna Belling, M.D.    Other results: EKG: Sinus rhythm; T wave inversion in leads 3 and aVF  Assessment & Plan  by Problem:  1.  Left-sided numbness/weakness.  No evidence of stroke on CT or MRI; etiology unclear; differential includes stroke not apparent on imaging, complicated migraine, or other problem.  Neurology consult is following patient, and we will await their opinion.  Plan is aspirin; 2-D echocardiogram; follow neuro status closely; supportive care.  2.  Headache.  Patient has history of chronic headaches; plan is symptomatic treatment.  3.  Chest pain.  Patient has a history of chest pain workup in the past by cardiac catheterization in 2008 and subsequent nuclear stress study, as well as with imaging of her chest.  She has inferior T wave inversions that were seen on prior EKGs.  Plan is monitor; serial enzymes; 2-D echocardiogram.  4.  Anxiety.  Need to clarify patient's home regimen.  5.  Other problems and plans as per the resident physician's note.

## 2012-11-27 NOTE — H&P (Signed)
Date: 11/27/2012               Patient Name:  [name] MRN: [id number]  DOB: 1966/02/08 Age / Sex: 47 y.o., female   PCP: Aura Dials, MD         Medical Service: Internal Medicine Teaching Service         Attending Physician: Dr. Farley Ly, MD    First Contact: Dr. Lauris Chroman Pager: [phone]  Second Contact: Dr. Lorretta Harp Pager: [phone]       After Hours (After 5p/  First Contact Pager: [phone]  weekends / holidays): Second Contact Pager: [phone]   Chief Complaint: Numbness and tingling  History of Present Illness:  [name] is a 47 y.o. F with PMH anxiety, multiple allergies who presents with left arm paresthesias.  Her symptoms began suddenly about 24 hours ago as a "tingling" feeling in her entire left side, worse in the left arm, which felt like it had "fallen asleep". At that time she also recalls a left facial droop causing drooling, and blurry vision out of her left eye. However, at interview all of these symptoms had resolved except for the tingling sensation in her left side. She denies frank weakness then or now. No speech changes or word-finding difficulties. Some gait instability, but she admits she is unstable at baseline 2/2 knee pain. Denies dizziness. She does endorse a frontal headache x2 days. Denies a personal history of migraines, but states "I get headaches whenever one of my kids is pregnant." She has no personal history of stroke or TIA. There is a family history of stroke in her grandmother. She has never smoked tobacco. Denies alcohol or drug use.  On review of systems, she does endorse chest pain, pinpointed to the left 5th intercostal space, sharp in nature, most painful with direct palpation and with rotating her trunk to the right. She also endorses NBNB vomiting x2weeks, up to 3 times a day. She has also had NB diarrhea during this 2 week timeframe, occuring "whenever she uses the bathroom." She has taken no new medications to treat any  of these issues. Denies SOB, abdominal pain, changes in urinary habits.   No current facility-administered medications on file prior to encounter.   Current Outpatient Prescriptions on File Prior to Encounter  Medication Sig Dispense Refill  . albuterol (PROVENTIL) (5 MG/ML) 0.5% nebulizer solution Take 5 mg by nebulization 2 (two) times daily.       . clonazePAM (KLONOPIN) 1 MG tablet Take 1 mg by mouth at bedtime.      Marland Kitchen EPINEPHrine (EPI-PEN) 0.3 mg/0.3 mL DEVI Inject 0.3 mg into the muscle daily as needed. For severe allergic reaction      . fluticasone (FLONASE) 50 MCG/ACT nasal spray Place 2 sprays into the nose daily.      . Multiple Vitamins-Minerals (MULTIVITAMIN WITH MINERALS) tablet Take 1 tablet by mouth daily.      . polycarbophil (FIBERCON) 625 MG tablet Take 625 mg by mouth daily.      . temazepam (RESTORIL) 7.5 MG capsule Take 7.5 mg by mouth at bedtime as needed. For insomnia       Meds: Current Facility-Administered Medications  Medication Dose Route Frequency Provider Last Rate Last Dose  . albuterol (PROVENTIL) (5 MG/ML) 0.5% nebulizer solution 5 mg  5 mg Nebulization BID Dow Adolph, MD   2.5 mg at 11/26/12 2309  . aspirin chewable tablet 81 mg  81 mg Oral Daily Richard  Zada Girt, MD      . budesonide-formoterol [facility]) 160-4.5 MCG/ACT inhaler 2 puff  2 puff Inhalation BID Dow Adolph, MD      . clonazePAM Scar[facility] Calico) tablet 1 mg  1 mg Oral QHS Dow Adolph, MD   1 mg at 11/26/12 2305  . fluticasone (FLONASE) 50 MCG/ACT nasal spray 2 spray  2 spray Each Nare Daily Dow Adolph, MD      . nitroGLYCERIN (NITROSTAT) SL tablet 0.4 mg  0.4 mg Sublingual Q5 min PRN Shanna Cisco, MD   0.4 mg at 11/26/12 1653  . ondansetron (ZOFRAN) injection 4 mg  4 mg Intravenous Once Shanna Cisco, MD      . prochlorperazine (COMPAZINE) injection 10 mg  10 mg Intravenous Q6H PRN Ritta Slot, MD      . temazepam (RESTORIL) capsule 7.5 mg  7.5 mg Oral QHS PRN  Dow Adolph, MD   7.5 mg at 11/26/12 2305    Allergies: Allergies as of 11/26/2012 - Review Complete 11/26/2012  Allergen Reaction Noted  . Banana Shortness Of Breath and Swelling 07/04/2011  . Latex Shortness Of Breath   . Onion Shortness Of Breath and Swelling 07/04/2011  . Codeine Swelling   . Hydrocodone Hives and Swelling   . Penicillins Swelling    Past Medical History  Diagnosis Date  . Tendonitis   . Obesity   . Hypertension    Past Surgical History  Procedure Laterality Date  . Cesarean section    . Abdominal hysterectomy    . Bowel resection    . Knee surgery     No family history on file. History   Social History  . Marital Status: Married    Spouse Name: N/A    Number of Children: N/A  . Years of Education: N/A   Occupational History  . Not on file.   Social History Main Topics  . Smoking status: Passive Smoke Exposure - Never Smoker  . Smokeless tobacco: Never Used  . Alcohol Use: No  . Drug Use: No  . Sexual Activity:    Other Topics Concern  . Not on file   Social History Narrative  . No narrative on file    Review of Systems: Pertinent items are noted in HPI.  Physical Exam: Blood pressure 123/78, pulse 74, temperature 98 F (36.7 C), resp. rate 18, height 4\' 11"  (1.499 m), weight 184 lb (83.462 kg), SpO2 100.00%. Physical Exam  Constitutional: She is oriented to person, place, and time and well-developed, well-nourished, and in no distress. No distress.  HENT:  Head: Normocephalic and atraumatic.  Mouth/Throat: Oropharynx is clear and moist.  Eyes: Conjunctivae and EOM are normal. Pupils are equal, round, and reactive to light.  Neck: Normal range of motion. Neck supple.  Cardiovascular: Normal rate, regular rhythm, normal heart sounds and intact distal pulses.  Exam reveals no gallop and no friction rub.   No murmur heard. Pulmonary/Chest: Effort normal and breath sounds normal. No respiratory distress. She has no wheezes. She  has no rales. She exhibits no tenderness.  Abdominal: Soft. Bowel sounds are normal. She exhibits no distension and no mass. There is no tenderness. There is no rebound and no guarding.  Musculoskeletal: Normal range of motion. She exhibits tenderness (Chest wall tenderness in L 5th intercostal space; general tenderness throughout L shoulder). She exhibits no edema.  Neurological: She is alert and oriented to person, place, and time. She has normal reflexes. She displays weakness (Strength 4/5 in muscles of LUE,  5/5 everywhere else). She displays facial symmetry, normal stance and normal speech. A cranial nerve deficit ("Tingling" sensation in L compared to R, CN V1-3) and sensory deficit (Again sensation is "more tingly" on entire L side compared to R; however can localize both light touch and pinprick) is present. She exhibits normal muscle tone. She has an abnormal Finger-Nose-Finger Test (Dysmetria on L). She has a normal Heel to [employer] and a normal Romberg Test. She shows no pronator drift. Gait normal. GCS score is 15. She displays no Babinski's sign on the right side. She displays no Babinski's sign on the left side.  Reflex Scores:      Brachioradialis reflexes are 2+ on the right side and 2+ on the left side.      Patellar reflexes are 2+ on the right side and 2+ on the left side. No deficits in rapid alternating movements.  Skin: She is not diaphoretic.  Psychiatric: Affect normal. Her mood appears anxious (Somewhat anxious and jumpy during the exam, but friendly and cooperative).   Lab results: Basic Metabolic Panel:  Recent Labs  16/10/96 1710  NA 143  K 3.9  CL 105  CO2 28  GLUCOSE 99  BUN 6  CREATININE 0.70  CALCIUM 9.3   CBC:  Recent Labs  11/26/12 1710  WBC 7.4  NEUTROABS 3.5  HGB 14.2  HCT 41.8  MCV 92.7  PLT 391   Cardiac Enzymes:  Recent Labs  11/27/12 0224  TROPONINI <0.30    Troponin i, poc  Date Value Range Status  11/26/2012 0.02  0.00 - 0.08  ng/mL Final   BNP:  Recent Labs  11/26/12 1710  PROBNP <5.0   CBG:  Recent Labs  11/26/12 2146  GLUCAP 111*   Fasting Lipid Panel:  Recent Labs  11/27/12 0500  CHOL 173  HDL 49  LDLCALC 98  TRIG 128  CHOLHDL 3.5   Urinalysis:  Recent Labs  11/26/12 1751  COLORURINE YELLOW  LABSPEC 1.021  PHURINE 6.5  GLUCOSEU NEGATIVE  HGBUR NEGATIVE  BILIRUBINUR NEGATIVE  KETONESUR NEGATIVE  PROTEINUR NEGATIVE  UROBILINOGEN 1.0  NITRITE NEGATIVE  LEUKOCYTESUR TRACE*   Imaging results:  Dg Chest 2 View  11/26/2012   *RADIOLOGY REPORT*  Clinical Data: Chest pain, left arm numbness, history hypertension  CHEST - 2 VIEW  Comparison: 09/17/2010  Findings: Minimal enlargement of cardiac silhouette. Mediastinal contours and pulmonary vascularity normal. Lungs clear. No pleural effusion or pneumothorax. Biconvex thoracolumbar scoliosis.  IMPRESSION: Minimal enlargement of cardiac silhouette. No acute abnormalities.   Original Report Authenticated By: Ulyses Southward, M.D.   Ct Head Wo Contrast  11/26/2012   *RADIOLOGY REPORT*  Clinical Data:  Frontal headache.  Left facial droop occurring yesterday.  CT HEAD WITHOUT CONTRAST  Technique:  Contiguous axial images were obtained from the base of the skull through the vertex without contrast  Comparison:  MR head 05/22/2008.  CT head 11/02/2006.  Findings:  The brain has a normal appearance without evidence for hemorrhage, acute infarction, hydrocephalus, or mass lesion.  There is no extra axial fluid collection.  The skull and paranasal sinuses are normal. No change from priors.  IMPRESSION: Normal CT of the head without contrast.   Original Report Authenticated By: Davonna Belling, M.D.   Other results: EKG: normal sinus rhythm, nonspecific ST and T waves changes - T wave inversion in inferior leads (III, aVF) but present also on prior EKG dated 06/26/11, left axis deviation.  Assessment & Plan by Problem: Ms. Nolan  Bucy is a 46 y.o. F with PMH  anxiety, multiple allergies who presents with left arm paresthesias x24 hours.  #Rule out stroke - Stroke is definite a possibility in this patient with sudden onset and persistent left-sided sensory changes, as well as mild LUE weakness and left-sided dysmetria on exam. Her report of a transient left facial droop is concerning for a dynamic ischemic process. Head CT was negative. Neurology is on board (Dr. Amada Jupiter) and already examined the patient. His exam was additionally notable for a left visual field deficit (homonymous hemianopia) concerning for posterior circulation stroke. Other items on the differential for her symptoms include complex migraine (patient with a headache x2 days, history of prior headaches), anxiety, and seizure (though she has no personal history of the latter). She is on significant amounts of benzodiazepines at home for anxiety (clonzaepam 1mg  qid, temazepam 7.5mg  qhs). While these medication don't lower the seizure threshold, withdrawal from them certainly could cause neurologic abnormalities. Benzo withdrawal can also cause vomiting and diarrhea, which she has been experiencing as well.  - Admit to IMTS, telemetry - Appreciate neuro recs - MRI, MRA brain without contrast - Frequent neuro checks - Echocardiogram - Carotid dopplers - ASA 81mg  - Compazine, toradol to treat possible migraine - PT/OT/SLP - Follow up HbA1C - Follow up UDS  #Chest pain - Sharp in nature, localized to the chest wall, reproducible with palpation, occurs with movement. Likely musculoskeletal in nature. POC troponin negative. Patient with T wave inversions in inferior leads, but these were present in a prior EKG from >1 year ago. Ischemia unlikely but we will do due diligence to rule it out in this patient. - Trending troponins - Repeat EKG in am  #Vomiting and diarrhea - No episodes presently. - Prochlorperazine prn nausea - Continue to monitor and treat as needed  #Anxiety - Patient  with somewhat anxious affect. - Continuing home clonazepam and temazepam - Will clarify her regimen and any recent changes that could have precipitated symptoms  #DVT PPX - SCDs  Dispo: Disposition is deferred at this time, awaiting improvement of current medical problems. Anticipated discharge in approximately 1-3 day(s).   The patient does have a current PCP Aura Dials, MD) and does need an [facility] hospital follow-up appointment after discharge.  The patient does not have transportation limitations that hinder transportation to clinic appointments.  Signed: Vivi Barrack, MD 11/27/2012, 6:22 AM

## 2012-11-27 NOTE — Consult Note (Signed)
Neurology Consultation Reason for Consult: Left-sided paresthesias Referring Physician: Meredith Pel, J  CC: Left-sided paresthesias  History is obtained from: Patient  HPI: [name] is a 47 y.o. female who has been having headache for 2 days, and noted to have sudden onset left-sided paresthesias and decreased sensation yesterday. This happened suddenly throughout her left side and has been static since that time. She has not noticed any changes in her vision. She has not noticed any true weakness.   LKW: 8/22 tpa given: no, out of window.  NIHSS: 4    ROS: A 14 point ROS was performed and is negative except as noted in the HPI.  Past Medical History  Diagnosis Date  . Tendonitis   . Obesity   . Hypertension     Family History: No history of migraine  Social History: Tob: Denies  Exam: Current vital signs: BP 123/78  Pulse 74  Temp(Src) 98 F (36.7 C)  Resp 18  Ht 4\' 11"  (1.499 m)  Wt 83.462 kg (184 lb)  BMI 37.14 kg/m2  SpO2 100% Vital signs in last 24 hours: Temp:  [98 F (36.7 C)-98.6 F (37 C)] 98 F (36.7 C) (08/23 2134) Pulse Rate:  [74-82] 74 (08/23 2134) Resp:  [18] 18 (08/23 2134) BP: (114-123)/(78-83) 123/78 mmHg (08/23 2134) SpO2:  [97 %-100 %] 100 % (08/23 2134) Weight:  [83.462 kg (184 lb)] 83.462 kg (184 lb) (08/23 2134)  General: In bed, NAD CV: Regular in rhythm Mental Status: Patient is awake, alert, oriented to person, place, month, year, and situation. Immediate and remote memory are intact. Patient is able to give a clear and coherent history. No signs of aphasia or neglect Cranial Nerves: II: Visual Fields are notable for a significant left field cut in both the upper and lower visual field. Pupils are equal, round, and reactive to light.  Discs are difficult to visualize. III,IV, VI: EOMI without ptosis or diploplia.  V: Facial sensation is diminished on left VII: Facial movement is symmetric.  VIII: hearing is intact to voice X:  Uvula elevates symmetrically XI: Shoulder shrug is symmetric. XII: tongue is midline without atrophy or fasciculations.  Motor: Tone is normal. Bulk is normal. 5/5 strength was present in all four extremities.  Sensory: Sensation is diminished on left Deep Tendon Reflexes: 2+ and symmetric in the biceps and patellae.  Plantars: Toes are downgoing bilaterally.  Cerebellar: FNF with ataxia on finger-nose-finger on the left Gait: Not tested due to patient safety concerns        I have reviewed labs in epic and the results pertinent to this consultation are: BMP, unremarkable  I have reviewed the images obtained: CT head-unremarkable  Impression: 47 year-old female with sudden onset left-sided numbness by history, and ataxia and field cut on exam. I suspect posterior circulation infarct given the acuity of the onset. She does have a headache, then a history of migraines, and complicated migraine may be in the differential as well  Recommendations: 1. HgbA1c, fasting lipid panel 2. MRI, MRA  of the brain without contrast, both negative for stroke team cancel echo and carotids 3. Frequent neuro checks 4. Echocardiogram 5. Carotid dopplers 6. Prophylactic therapy-Antiplatelet med: Aspirin - dose 81 mg 7. Risk factor modification 8. Telemetry monitoring 9. PT consult, OT consult, Speech consult 10. I will also order a dose of Compazine and Toradol with the possibility of migraine.    Ritta Slot, MD [facility] [phone]  If 7pm- 7am, please page neurology on call  at [phone].

## 2012-11-27 NOTE — Progress Notes (Signed)
Stroke Team Progress Note  HISTORY  HPI: [name] is a 47 y.o. female who had been having a headache for 2 days, and subsequently developed sudden onset of left-sided paresthesias with decreased sensation on 11/25/2012. She has been static since that time. She had not noticed any changes in her vision. She had not noticed any true weakness.   LKW: 8/22  tpa given: no, out of window.  NIHSS: 4  SUBJECTIVE The patient's husband is at the bedside this morning. The patient just returned from her MRI. The report is pending. She feels her weakness on the left has increased. She also has a severe headache on the right which radiates around to the occipital area and down her neck. She states the pain is worse than a 10 over 10. She is requesting pain medication.   OBJECTIVE Most recent Vital Signs: Filed Vitals:   11/27/12 0100 11/27/12 0424 11/27/12 0630 11/27/12 0824  BP: 109/71 107/87 112/72   Pulse: 75 71 80   Temp: 98.1 F (36.7 C) 98.2 F (36.8 C) 97.9 F (36.6 C)   TempSrc:  Oral Oral   Resp: 18 18 18    Height:      Weight:      SpO2: 100% 98% 98% 98%   CBG (last 3)   Recent Labs  11/26/12 2146 11/27/12 0759  GLUCAP 111* 73    IV Fluid Intake:     MEDICATIONS  . albuterol  2 puff Inhalation BID  . aspirin  81 mg Oral Daily  . budesonide-formoterol  2 puff Inhalation BID  . clonazePAM  1 mg Oral QHS  . fluticasone  2 spray Each Nare Daily  . ondansetron (ZOFRAN) IV  4 mg Intravenous Once   PRN:  albuterol, nitroGLYCERIN, prochlorperazine, temazepam  Diet:  NPO  Activity:  Bathroom privileges with assistance. DVT Prophylaxis:  SCDs  CLINICALLY SIGNIFICANT STUDIES Basic Metabolic Panel:   Recent Labs Lab 11/26/12 1710  NA 143  K 3.9  CL 105  CO2 28  GLUCOSE 99  BUN 6  CREATININE 0.70  CALCIUM 9.3   Liver Function Tests: No results found for this basename: AST, ALT, ALKPHOS, BILITOT, PROT, ALBUMIN,  in the last 168 hours CBC:   Recent Labs Lab  11/26/12 1710  WBC 7.4  NEUTROABS 3.5  HGB 14.2  HCT 41.8  MCV 92.7  PLT 391   Coagulation: No results found for this basename: LABPROT, INR,  in the last 168 hours Cardiac Enzymes:   Recent Labs Lab 11/27/12 0224 11/27/12 0800  TROPONINI <0.30 <0.30   Urinalysis:   Recent Labs Lab 11/26/12 1751  COLORURINE YELLOW  LABSPEC 1.021  PHURINE 6.5  GLUCOSEU NEGATIVE  HGBUR NEGATIVE  BILIRUBINUR NEGATIVE  KETONESUR NEGATIVE  PROTEINUR NEGATIVE  UROBILINOGEN 1.0  NITRITE NEGATIVE  LEUKOCYTESUR TRACE*   Lipid Panel    Component Value Date/Time   CHOL 173 11/27/2012 0500   TRIG 128 11/27/2012 0500   HDL 49 11/27/2012 0500   CHOLHDL 3.5 11/27/2012 0500   VLDL 26 11/27/2012 0500   LDLCALC 98 11/27/2012 0500   HgbA1C  No results found for this basename: HGBA1C    Urine Drug Screen:   No results found for this basename: labopia,  cocainscrnur,  labbenz,  amphetmu,  thcu,  labbarb    Alcohol Level: No results found for this basename: ETH,  in the last 168 hours  Dg Chest 2 View 11/26/2012   Minimal enlargement of cardiac silhouette. No acute abnormalities.  Ct Head Wo Contrast 11/26/2012   Normal CT of the head without contrast.       MRI of the brain  pending  MRA of the brain  pending  2D Echocardiogram  pending  Carotid Doppler  pending  EKG   sinus rhythm rate 73 beats per minute  Therapy Recommendations pending  Physical Exam    General: In bed, NAD  CV: Regular in rhythm  Mental Status:  Patient is awake, alert, oriented to person, place, month, year, and situation.  Immediate and remote memory are intact.  Patient is able to give a clear and coherent history.  No signs of aphasia or neglect  Cranial Nerves:  II: Visual Fields are notable for a significant left field cut in both the upper and lower visual field. Pupils are equal, round, and reactive to light. Discs are difficult to visualize.  III,IV, VI: EOMI without ptosis or diploplia.  V:  Facial sensation is diminished on left  VII: Facial movement is symmetric.  VIII: hearing is intact to voice  X: Uvula elevates symmetrically  XI: Shoulder shrug is symmetric.  XII: tongue is midline without atrophy or fasciculations.  Motor:  Tone is normal. Bulk is normal. 5/5 strength was present in all four extremities.  Sensory:  Sensation is diminished on left  Deep Tendon Reflexes:  2+ and symmetric in the biceps and patellae.  Plantars:  Toes are downgoing bilaterally.  Cerebellar:  FNF with ataxia on finger-nose-finger on the left  Gait:  Not tested due to patient safety concerns  ASSESSMENT [name] is a 47 y.o. female presenting with a headache, left hemisensory paresthesias and left visual field deficit. TPA was not given as the patient missed the therapeutic window.  A CT scan on admission showed no infarct. An MRI/MRA is pending. On no antithrombotics prior to admission. Now on aspirin 81 mg orally every day for secondary stroke prevention. Patient with resultant left hemisensory deficits. Work up underway.   Low normal blood pressure. ( History of hypertension )  Multiple allergies  Anxiety / depression  ? Asthma  Hospital day # 1  TREATMENT/PLAN  Continue aspirin 81 mg orally every day for secondary stroke prevention.  Await therapist's evaluations  Await MRI/MRA, 2-D echo, and carotid Dopplers  Delton See PA-C [facility] Pager [phone] 11/27/2012, 9:47 AM  I have personally obtained a history, examined the patient, evaluated imaging results, and formulated the assessment and plan of care. I agree with the above.  MRI/MRA no acute intracranial abnormality. Symptoms most likely due to complicated migraine. Would order trial of phenergan.  If headache persists despite the opiate use, would order a trial of decadron 10mg  x1 dose.  Rest of work up as above.  Pauletta Browns

## 2012-11-27 NOTE — Progress Notes (Signed)
Subjective: [name] is a 47 y.o. F with PMH anxiety, multiple allergies who presents with left arm paresthesias.  Patient seen at bedside this AM. She was complaining of severe HA, relieved by Toradol injection. She also describes some photophobia, in addition to continued left sided tingling (upper and lower extremities). ON exam, patient was noted to have decreased peripheral vision on the left side, as well as decreased cerebellar functions including rapid hand movements and finger-to-nose test.   MRI/MRA performed today, show no abnormalities. Carotid doppler shows 1-39% stenosis bilaterally. ECHO results pending.  Objective: Vital signs in last 24 hours: Filed Vitals:   11/27/12 0424 11/27/12 0630 11/27/12 0824 11/27/12 1400  BP: 107/87 112/72  110/76  Pulse: 71 80  72  Temp: 98.2 F (36.8 C) 97.9 F (36.6 C)  97.8 F (36.6 C)  TempSrc: Oral Oral  Oral  Resp: 18 18  18   Height:      Weight:      SpO2: 98% 98% 98% 98%   Weight change:   Intake/Output Summary (Last 24 hours) at 11/27/12 1702 Last data filed at 11/27/12 1657  Gross per 24 hour  Intake    442 ml  Output   1025 ml  Net   -583 ml   Physical Exam: General: Alert, cooperative, and in no apparent distress HEENT: Vision grossly intact, however peripheral field decreased in left eye, oropharynx clear and non-erythematous  Neck: Full range of motion without pain, supple, no lymphadenopathy or carotid bruits Lungs: Clear to ascultation bilaterally, normal work of respiration, no wheezes, rales, ronchi Heart: Regular rate and rhythm, no murmurs, gallops, or rubs. Chest tenderness to palpation on left chest, just underneath breast. Abdomen: Soft, non-tender, non-distended, normal bowel sounds Extremities: No cyanosis, clubbing, or edema. Notes tingling in left upper and lower extremities. Neurologic: Alert & oriented X3, cranial nerves II-XII intact, strength grossly intact, sensation intact to light touch,  however, patient noted to have tingling sensation in upper and lower extremities. Dysfunction in finger-to-nose test on left side, as well as decreased ability to perform rapid hand movements on left side.   Lab Results: Basic Metabolic Panel:  Recent Labs Lab 11/26/12 1710  NA 143  K 3.9  CL 105  CO2 28  GLUCOSE 99  BUN 6  CREATININE 0.70  CALCIUM 9.3   CBC:  Recent Labs Lab 11/26/12 1710  WBC 7.4  NEUTROABS 3.5  HGB 14.2  HCT 41.8  MCV 92.7  PLT 391   Cardiac Enzymes:  Recent Labs Lab 11/27/12 0224 11/27/12 0800 11/27/12 1350  TROPONINI <0.30 <0.30 <0.30   BNP:  Recent Labs Lab 11/26/12 1710  PROBNP <5.0   CBG:  Recent Labs Lab 11/26/12 2146 11/27/12 0759 11/27/12 1140 11/27/12 1649  GLUCAP 111* 73 155* 82   Urinalysis:  Recent Labs Lab 11/26/12 1751  COLORURINE YELLOW  LABSPEC 1.021  PHURINE 6.5  GLUCOSEU NEGATIVE  HGBUR NEGATIVE  BILIRUBINUR NEGATIVE  KETONESUR NEGATIVE  PROTEINUR NEGATIVE  UROBILINOGEN 1.0  NITRITE NEGATIVE  LEUKOCYTESUR TRACE*   Micro Results: Recent Results (from the past 240 hour(s))  MRSA PCR SCREENING     Status: None   Collection Time    11/26/12 11:22 PM      Result Value Range Status   MRSA by PCR NEGATIVE  NEGATIVE Final   Comment:            The GeneXpert MRSA Assay (FDA     approved for NASAL specimens  only), is one component of a     comprehensive MRSA colonization     surveillance program. It is not     intended to diagnose MRSA     infection nor to guide or     monitor treatment for     MRSA infections.   Studies/Results: Dg Chest 2 View  11/26/2012   *RADIOLOGY REPORT*  Clinical Data: Chest pain, left arm numbness, history hypertension  CHEST - 2 VIEW  Comparison: 09/17/2010  Findings: Minimal enlargement of cardiac silhouette. Mediastinal contours and pulmonary vascularity normal. Lungs clear. No pleural effusion or pneumothorax. Biconvex thoracolumbar scoliosis.  IMPRESSION: Minimal  enlargement of cardiac silhouette. No acute abnormalities.   Original Report Authenticated By: Ulyses Southward, M.D.   Ct Head Wo Contrast  11/26/2012   *RADIOLOGY REPORT*  Clinical Data:  Frontal headache.  Left facial droop occurring yesterday.  CT HEAD WITHOUT CONTRAST  Technique:  Contiguous axial images were obtained from the base of the skull through the vertex without contrast  Comparison:  MR head 05/22/2008.  CT head 11/02/2006.  Findings:  The brain has a normal appearance without evidence for hemorrhage, acute infarction, hydrocephalus, or mass lesion.  There is no extra axial fluid collection.  The skull and paranasal sinuses are normal. No change from priors.  IMPRESSION: Normal CT of the head without contrast.   Original Report Authenticated By: Davonna Belling, M.D.   Mr [facility] Head Wo Contrast  11/27/2012   *RADIOLOGY REPORT*  Clinical Data:  Left-sided paresthesias.  Decreased sensation. Headache. Stroke risk factors include history of hypertension.  MRI HEAD WITHOUT CONTRAST MRA HEAD WITHOUT CONTRAST  Technique:  Multiplanar, multiecho pulse sequences of the brain and surrounding structures were obtained without intravenous contrast. Angiographic images of the head were obtained using MRA technique without contrast.  Comparison:  CT head 11/26/2012.  MRI HEAD  Findings:  No acute stroke or hemorrhage.  No mass lesion or hydrocephalus.  No extra-axial fluid.  Normal cerebral volume.  Minimal nonspecific periventricular white matter signal abnormality, primarily bilateral occipital, possible chronic microvascular ischemic change.  No ventricular enlargement to suggest transependymal absorption.  No brainstem or cerebellar edema to suggest PRES.  Normal calvarium and skull base.  Unremarkable pituitary and cerebellar tonsils.  No upper cervical compressive lesion is evident.  Mild chronic sinus disease.  Negative mastoids and orbits.  Flow voids are maintained throughout the major intracranial vascular  structures.  IMPRESSION: Minimal nonspecific periventricular white matter signal abnormality, possible mild small vessel disease.  No acute intracranial findings. No areas of acute infarction within the intracranial pathways subserving sensation.  MRA HEAD  Findings: Internal carotid arteries are widely patent.  Basilar artery is widely patent with the right vertebral as the sole contributor.  Left vertebral ends in PICA.  No intracranial stenosis or aneurysm.  IMPRESSION: Unremarkable MRA intracranial circulation.   Original Report Authenticated By: Davonna Belling, M.D.   Mr Brain Wo Contrast  11/27/2012   *RADIOLOGY REPORT*  Clinical Data:  Left-sided paresthesias.  Decreased sensation. Headache. Stroke risk factors include history of hypertension.  MRI HEAD WITHOUT CONTRAST MRA HEAD WITHOUT CONTRAST  Technique:  Multiplanar, multiecho pulse sequences of the brain and surrounding structures were obtained without intravenous contrast. Angiographic images of the head were obtained using MRA technique without contrast.  Comparison:  CT head 11/26/2012.  MRI HEAD  Findings:  No acute stroke or hemorrhage.  No mass lesion or hydrocephalus.  No extra-axial fluid.  Normal cerebral volume.  Minimal nonspecific  periventricular white matter signal abnormality, primarily bilateral occipital, possible chronic microvascular ischemic change.  No ventricular enlargement to suggest transependymal absorption.  No brainstem or cerebellar edema to suggest PRES.  Normal calvarium and skull base.  Unremarkable pituitary and cerebellar tonsils.  No upper cervical compressive lesion is evident.  Mild chronic sinus disease.  Negative mastoids and orbits.  Flow voids are maintained throughout the major intracranial vascular structures.  IMPRESSION: Minimal nonspecific periventricular white matter signal abnormality, possible mild small vessel disease.  No acute intracranial findings. No areas of acute infarction within the intracranial  pathways subserving sensation.  MRA HEAD  Findings: Internal carotid arteries are widely patent.  Basilar artery is widely patent with the right vertebral as the sole contributor.  Left vertebral ends in PICA.  No intracranial stenosis or aneurysm.  IMPRESSION: Unremarkable MRA intracranial circulation.   Original Report Authenticated By: Davonna Belling, M.D.   Medications: I have reviewed the patient's current medications. Scheduled Meds: . albuterol  2 puff Inhalation BID  . aspirin  81 mg Oral Daily  . budesonide-formoterol  2 puff Inhalation BID  . clonazePAM  1 mg Oral QHS  . fluticasone  2 spray Each Nare Daily  . ondansetron (ZOFRAN) IV  4 mg Intravenous Once   Continuous Infusions:  PRN Meds:.albuterol, nitroGLYCERIN, prochlorperazine, temazepam Assessment/Plan:  [name] is a 46 y.o. F with PMH anxiety, multiple allergies who presents with left arm paresthesias x24 hours.  #Left-sided paresthesias - Pateint presents with notable left upper and lower extremity tingling. She was also noted to have dysfunction in finger-to-nose testing and rapid hand movement testing on the LUE. Also has decreased peripheral visual field deficit in the left side. Head CT was negative in the ED. MRI/MRA performed today, dhows no ischemic abnormalities. Neurology is on board (Dr. Amada Jupiter) and examined the patient. His exam was also notable for a left visual field deficit (homonymous hemianopia) concerning for posterior circulation stroke. Other items on the differential for her symptoms include anxiety, and seizure (though she has no personal history of the latter). She is on significant amounts of benzodiazepines at home for anxiety (clonazepam 1mg  qid, temazepam 7.5mg  qhs). While these medication don't lower the seizure threshold, withdrawal from them certainly could cause neurologic abnormalities. Benzo withdrawal can also cause vomiting and diarrhea, which she has been experiencing as well. More  significantly, the patient has been complaining of severe HA for the past couple days, w/ accompanying photophobia and nausea. She did receive a toradol injection today, which significantly relieved her pain. - Continue w/ telemetry monitoring - Appreciate neuro recs  - Frequent neuro checks  - Echocardiogram performed today, results pending. - Carotid dopplers show 1-39% stenosis (-ve). - Continue ASA 81mg   - Continue compazine, toradol to treat possible migraine  - PT/OT recommendations still pending. SLP saw patient today, full report still pending, however she did pass swallow testing, started on diet today. - Follow up HbA1C  - Follow up UDS   #Chest pain - Sharp in nature, localized to the chest wall, reproducible with palpation, occurs with movement. Likely musculoskeletal in nature. Troponin negative x3. Patient with T wave inversions in inferior leads, but these were present in a prior EKG from >1 year ago. Ischemia unlikely at this time. - Repeat EKG in am shows no dynamic changes from previous EKG  #Vomiting and diarrhea - No episodes presently.  - Prochlorperazine prn nausea  - Continue to monitor and treat as needed   #Anxiety - Patient  with somewhat anxious affect. Less so during the day today. - Continuing home clonazepam and temazepam  - Will clarify her regimen and any recent changes that could have precipitated symptoms   #DVT PPX - SCDs  Dispo: Disposition is deferred at this time, awaiting improvement of current medical problems.  Anticipated discharge in approximately 2-3 day(s).   The patient does have a current PCP Aura Dials, MD) and does need an [facility] hospital follow-up appointment after discharge.  The patient does not have transportation limitations that hinder transportation to clinic appointments.  .Services Needed at time of discharge: Y = Yes, Blank = No PT:   OT:   RN:   Equipment:   Other:     LOS: 1 day   Lars Masson, MD 11/27/2012, 5:02  PM Pager: [phone]

## 2012-11-27 NOTE — Progress Notes (Signed)
*  PRELIMINARY RESULTS* Vascular Ultrasound Carotid Duplex (Doppler) has been completed.  Preliminary findings: Bilateral:  1-39% ICA stenosis.  Vertebral artery flow is antegrade.      Farrel Demark, RDMS, RVT  11/27/2012, 10:40 AM

## 2012-11-27 NOTE — Progress Notes (Signed)
  Echocardiogram 2D Echocardiogram has been performed.  [name] 11/27/2012, 5:00 PM

## 2012-11-27 NOTE — Evaluation (Signed)
Physical Therapy Evaluation Patient Details Name: [name] MRN: [id number] DOB: 1966/03/15 Today's Date: 11/27/2012 Time: 2130-8657 PT Time Calculation (min): 19 min  PT Assessment / Plan / Recommendation History of Present Illness  Patient is a 47 yo female admitted with chest wall pain, Lt UE/side "tingling", headache, blurry vision, and Lt facial droop.  Clinical Impression  Patient presents with problems listed below.  Will benefit from acute PT to maximize independence prior to discharge home with family.    PT Assessment  Patient needs continued PT services    Follow Up Recommendations  Home health PT;Supervision/Assistance - 24 hour    Does the patient have the potential to tolerate intense rehabilitation      Barriers to Discharge        Equipment Recommendations  None recommended by PT    Recommendations for Other Services     Frequency Min 3X/week    Precautions / Restrictions Precautions Precautions: Fall Restrictions Weight Bearing Restrictions: No   Pertinent Vitals/Pain       Mobility  Bed Mobility Bed Mobility: Supine to Sit;Sitting - Scoot to Edge of Bed;Sit to Supine Supine to Sit: 5: Supervision;HOB elevated Sitting - Scoot to Edge of Bed: 5: Supervision Sit to Supine: 5: Supervision;HOB elevated Details for Bed Mobility Assistance: No cues needed.  Supervision for safety only Transfers Transfers: Sit to Stand;Stand to Sit Sit to Stand: 4: Min guard;With upper extremity assist;From bed Stand to Sit: 4: Min guard;With upper extremity assist;To bed Details for Transfer Assistance: Verbal cues for hand placement. Ambulation/Gait Ambulation/Gait Assistance: 4: Min guard Ambulation Distance (Feet): 175 Feet Assistive device: Rolling walker Ambulation/Gait Assistance Details: Initiated ambulation without assistive device.  Patient unsteady, reaching for objects for support.  Provided RW.  Gait remained slow and guarded.  Noted instability in bil.  knees during stance phase of gait. Gait Pattern: Step-through pattern;Decreased step length - right;Decreased step length - left;Decreased stride length;Shuffle Gait velocity: Slow gait speed Modified Rankin (Stroke Patients Only) Pre-Morbid Rankin Score: Moderate disability Modified Rankin: Moderately severe disability    Exercises     PT Diagnosis: Difficulty walking;Abnormality of gait;Generalized weakness;Acute pain  PT Problem List: Decreased strength;Decreased activity tolerance;Decreased balance;Decreased mobility;Cardiopulmonary status limiting activity;Impaired sensation;Pain PT Treatment Interventions: DME instruction;Gait training;Stair training;Functional mobility training;Balance training;Patient/family education     PT Goals(Current goals can be found in the care plan section) Acute Rehab PT Goals Patient Stated Goal: To get better PT Goal Formulation: With patient/family Time For Goal Achievement: 12/04/12 Potential to Achieve Goals: Good  Visit Information  Last PT Received On: 11/27/12 Assistance Needed: +1 History of Present Illness: Patient is a 46 yo female admitted with chest wall pain, Lt UE/side "tingling", headache, blurry vision, and Lt facial droop.       Prior Functioning  Home Living Family/patient expects to be discharged to:: Private residence Living Arrangements: Spouse/significant other Available Help at Discharge: Family (Husband works.  Staying with cousin during day) Type of Home: House Home Access: Stairs to enter [employer] of Steps: 5 Entrance Stairs-Rails: Right;Left Home Layout: One level Home Equipment: Walker - 2 wheels;Shower seat Prior Function Level of Independence: Independent with assistive device(s) Comments: Patient with bil. knee pain pta, impacting gait - unsteady. Communication Communication: No difficulties    Cognition  Cognition Arousal/Alertness: Awake/alert Behavior During Therapy: Anxious Overall  Cognitive Status: Within Functional Limits for tasks assessed    Extremity/Trunk Assessment Upper Extremity Assessment Upper Extremity Assessment: LUE deficits/detail LUE Deficits / Details: During MMT, LUE gives  way with pressure.  Grossly 4-/5 LUE Sensation:  (Tested normal to light touch.  Pt reports tingling) Lower Extremity Assessment Lower Extremity Assessment: Generalized weakness (Bil. knee instability/pain with gait) Cervical / Trunk Assessment Cervical / Trunk Assessment: Normal   Balance Balance Balance Assessed: Yes Static Sitting Balance Static Sitting - Balance Support: No upper extremity supported;Feet supported Static Sitting - Level of Assistance: 5: Stand by assistance Static Sitting - Comment/# of Minutes: 3 Static Standing Balance Static Standing - Balance Support: No upper extremity supported Static Standing - Level of Assistance: 5: Stand by assistance Static Standing - Comment/# of Minutes: 2  End of Session PT - End of Session Equipment Utilized During Treatment: Gait belt Activity Tolerance: Patient tolerated treatment well;Patient limited by fatigue Patient left: in bed;with call bell/phone within reach;with family/visitor present Nurse Communication: Mobility status  GP     Vena Austria 11/27/2012, 6:23 PM Durenda Hurt. Renaldo Fiddler, [facility] Acute Rehab Services Pager [phone]

## 2012-11-28 DIAGNOSIS — R519 Headache, unspecified: Secondary | ICD-10-CM | POA: Diagnosis present

## 2012-11-28 DIAGNOSIS — R51 Headache: Secondary | ICD-10-CM | POA: Diagnosis present

## 2012-11-28 LAB — GLUCOSE, CAPILLARY: Glucose-Capillary: 101 mg/dL — ABNORMAL HIGH (ref 70–99)

## 2012-11-28 MED ORDER — DEXTROSE 5 % IV SOLN
1.0000 g | Freq: Once | INTRAVENOUS | Status: AC
  Administered 2012-11-28: 1000 mg via INTRAVENOUS
  Filled 2012-11-28: qty 10

## 2012-11-28 MED ORDER — SUMATRIPTAN SUCCINATE 6 MG/0.5ML ~~LOC~~ SOLN
6.0000 mg | Freq: Once | SUBCUTANEOUS | Status: AC
  Administered 2012-11-28: 6 mg via SUBCUTANEOUS
  Filled 2012-11-28: qty 0.5

## 2012-11-28 MED ORDER — ONDANSETRON HCL 4 MG/2ML IJ SOLN
4.0000 mg | Freq: Once | INTRAMUSCULAR | Status: AC
  Administered 2012-11-28: 4 mg via INTRAVENOUS

## 2012-11-28 MED ORDER — DIVALPROEX SODIUM ER 500 MG PO TB24
500.0000 mg | ORAL_TABLET | Freq: Every day | ORAL | Status: DC
  Administered 2012-11-29: 500 mg via ORAL
  Filled 2012-11-28: qty 1

## 2012-11-28 NOTE — Progress Notes (Signed)
Stroke Team Progress Note  HISTORY [name] is a 47 y.o. female who had been having a headache for 2 days, and subsequently developed sudden onset of left-sided paresthesias with decreased sensation on 11/25/2012. She has been static since that time. She had not noticed any changes in her vision. She had not noticed any true weakness. The patient was not a TPA candidate due to delay in arrival. She was admitted for further stroke evaluation.   SUBJECTIVE Complains of continued headache.  OBJECTIVE Most recent Vital Signs: Filed Vitals:   11/27/12 2059 11/28/12 0000 11/28/12 0400 11/28/12 0757  BP:  139/85 123/66 130/92  Pulse:  79 64 81  Temp:  98.1 F (36.7 C) 98.2 F (36.8 C) 98.3 F (36.8 C)  TempSrc:  Oral Oral Oral  Resp:  18 18 20   Height:      Weight:      SpO2: 97% 97% 96% 100%   CBG (last 3)   Recent Labs  11/27/12 1649 11/27/12 2053 11/28/12 0753  GLUCAP 82 140* 101*    IV Fluid Intake:     MEDICATIONS  . albuterol  2 puff Inhalation BID  . aspirin  81 mg Oral Daily  . budesonide-formoterol  2 puff Inhalation BID  . clonazePAM  1 mg Oral QHS  . fluticasone  2 spray Each Nare Daily  . ondansetron (ZOFRAN) IV  4 mg Intravenous Once   PRN:  albuterol, ketorolac, nitroGLYCERIN, prochlorperazine, temazepam  Diet:  Cardiac thin liquids Activity:  Bathroom privileges with assistance. DVT Prophylaxis:  SCDs  CLINICALLY SIGNIFICANT STUDIES Basic Metabolic Panel:   Recent Labs Lab 11/26/12 1710  NA 143  K 3.9  CL 105  CO2 28  GLUCOSE 99  BUN 6  CREATININE 0.70  CALCIUM 9.3   Liver Function Tests: No results found for this basename: AST, ALT, ALKPHOS, BILITOT, PROT, ALBUMIN,  in the last 168 hours CBC:   Recent Labs Lab 11/26/12 1710  WBC 7.4  NEUTROABS 3.5  HGB 14.2  HCT 41.8  MCV 92.7  PLT 391   Coagulation: No results found for this basename: LABPROT, INR,  in the last 168 hours Cardiac Enzymes:   Recent Labs Lab 11/27/12 0224  11/27/12 0800 11/27/12 1350  TROPONINI <0.30 <0.30 <0.30   Urinalysis:   Recent Labs Lab 11/26/12 1751  COLORURINE YELLOW  LABSPEC 1.021  PHURINE 6.5  GLUCOSEU NEGATIVE  HGBUR NEGATIVE  BILIRUBINUR NEGATIVE  KETONESUR NEGATIVE  PROTEINUR NEGATIVE  UROBILINOGEN 1.0  NITRITE NEGATIVE  LEUKOCYTESUR TRACE*   Lipid Panel    Component Value Date/Time   CHOL 173 11/27/2012 0500   TRIG 128 11/27/2012 0500   HDL 49 11/27/2012 0500   CHOLHDL 3.5 11/27/2012 0500   VLDL 26 11/27/2012 0500   LDLCALC 98 11/27/2012 0500   HgbA1C  Lab Results  Component Value Date   HGBA1C 5.7* 11/26/2012    Urine Drug Screen:      Component Value Date/Time   LABOPIA NONE DETECTED 11/27/2012 2257    Alcohol Level: No results found for this basename: ETH,  in the last 168 hours   CT Head Wo Contrast  11/26/2012   Normal CT of the head without contrast.     MRI of the brain  11/27/2012    Minimal nonspecific periventricular white matter signal abnormality, possible mild small vessel disease.  No acute intracranial findings. No areas of acute infarction within the intracranial pathways subserving sensation.   MRA of the brain  11/27/2012  Unremarkable MRA intracranial circulation.    2D Echocardiogram    Carotid Doppler  No evidence of hemodynamically significant internal carotid artery stenosis. Vertebral artery flow is antegrade.   EKG   sinus rhythm rate 73 beats per minute  Dg Chest 11/26/2012   Minimal enlargement of cardiac silhouette. No acute abnormalities.     Therapy Recommendations HH PT  Physical Exam   General: In bed, NAD  CV: Regular in rhythm  Mental Status:  Patient is awake, alert, oriented to person, place, month, year, and situation.  Immediate and remote memory are intact.  Patient is able to give a clear and coherent history.  No signs of aphasia or neglect  Cranial Nerves:  II: Visual Fields are notable for a significant left field cut in both the upper and lower visual  field. Pupils are equal, round, and reactive to light. Discs are difficult to visualize.  III,IV, VI: EOMI without ptosis or diploplia.  V: Facial sensation is diminished on left  VII: Facial movement is symmetric.  VIII: hearing is intact to voice  X: Uvula elevates symmetrically  XI: Shoulder shrug is symmetric.  XII: tongue is midline without atrophy or fasciculations.  Motor:  Tone is normal. Bulk is normal. 5/5 strength was present in all four extremities.  Sensory:  Sensation is diminished on left  Deep Tendon Reflexes:  2+ and symmetric in the biceps and patellae.  Plantars:  Toes are downgoing bilaterally.  Cerebellar:  FNF with ataxia on finger-nose-finger on the left  Gait:  Not tested due to patient safety concerns  ASSESSMENT [name] is a 47 y.o. female presenting with a headache, left hemisensory paresthesias and left visual field deficit. TPA was not given as the patient missed the therapeutic window.  Imaging is negative for acute infarct. Dx: complicated migraine, no stroke or TIA dx in the setting of atypical chest pain. On no antithrombotics prior to admission. Now on aspirin 81 mg orally every day for secondary stroke prevention. Patient with resultant left field cut and left sided numbness. Work up completed.   Low normal blood pressure. ( History of hypertension )  Multiple allergies  Anxiety / depression  ? Asthma  Hospital day # 2  TREATMENT/PLAN  imitrex 6 mg sq x 1 now. If headache does not resolve, recommend 1 gm of depacon IV load. If works, then continue depakote ER 500 mg daily.   Stop aspirin. No indication to continue aspirin 81 mg orally every day for primary stroke prevention as she has no documented risk factors.  Home health PT recommended by therapy   F/u 2-D echo results  D/w Dr Lauris Chroman- internal medicine resident Annie Main, MSN, RN, ANVP-BC, ANP-BC, GNP-BC [facility] Pager: [phone] 11/28/2012 8:59  AM  I have personally obtained a history, examined the patient, evaluated imaging results, and formulated the assessment and plan of care. I agree with the above. Delia Heady, MD

## 2012-11-28 NOTE — Progress Notes (Signed)
.  Internal Medicine Attending  Date: 11/28/2012  Patient name: [name] Medical record number: [id number] Date of birth: Dec 31, 1965 Age: 47 y.o. Gender: female  I saw and evaluated the patient on a.m. rounds with house staff.  I reviewed the resident's note by Dr. Yetta Barre and I agree with the resident's findings and plans as documented in his note.

## 2012-11-28 NOTE — Evaluation (Signed)
Occupational Therapy Evaluation Patient Details Name: [name] MRN: [id number] DOB: [birth year] Today's Date: 11/28/2012 Time: 3244-0102 OT Time Calculation (min): 43 min  OT Assessment / Plan / Recommendation History of present illness Patient is a 47 yo female admitted with chest wall pain, Lt UE/side "tingling", headache, blurry vision, and Lt facial droop. MRI and MRA unremarkable.  Migraine suspected.   Clinical Impression   Pt performing at a supervision for safety level in mobility and ADL.  Educated pt in compensatory strategies related to visual field deficit and safety given reported numbness and tingling in her L UE.  Pt will have supervision of her family upon return home.  No further OT needs.    OT Assessment  Patient does not need any further OT services    Follow Up Recommendations  Supervision/Assistance - 24 hour;No OT follow up    Barriers to Discharge      Equipment Recommendations  None recommended by OT    Recommendations for Other Services    Frequency       Precautions / Restrictions Precautions Precautions: Fall Restrictions Weight Bearing Restrictions: No   Pertinent Vitals/Pain 5/10 HA, premedication, VSS    ADL  Eating/Feeding: Set up Where Assessed - Eating/Feeding: Edge of bed Grooming: Wash/dry hands;Supervision/safety Where Assessed - Grooming: Unsupported standing Upper Body Bathing: Set up Where Assessed - Upper Body Bathing: Unsupported sitting Lower Body Bathing: Supervision/safety Where Assessed - Lower Body Bathing: Unsupported sitting;Supported sit to stand Upper Body Dressing: Set up Where Assessed - Upper Body Dressing: Unsupported sitting Lower Body Dressing: Supervision/safety Where Assessed - Lower Body Dressing: Unsupported sitting;Supported sit to stand Toilet Transfer: Supervision/safety Toilet Transfer Method: Sit to Barista: Regular height toilet;Grab bars Toileting - Clothing Manipulation  and Hygiene: Modified independent Where Assessed - Engineer, mining and Hygiene: Sit to stand from 3-in-1 or toilet Transfers/Ambulation Related to ADLs: ambulated in hall holding IV pole with supervision, no LOB ADL Comments: Easily able to reach her feet by crossing her foot over opposite knee.    OT Diagnosis:    OT Problem List:   OT Treatment Interventions:     OT Goals(Current goals can be found in the care plan section) Acute Rehab OT Goals Patient Stated Goal: To get better  Visit Information  Last OT Received On: 11/28/12 Assistance Needed: +1 History of Present Illness: Patient is a 47 yo female admitted with chest wall pain, Lt UE/side "tingling", headache, blurry vision, and Lt facial droop. MRI and MRA unremarkable.  Migraine suspected.       Prior Functioning     Home Living Family/patient expects to be discharged to:: Private residence Living Arrangements: Spouse/significant other Available Help at Discharge: Family Type of Home: House Home Access: Stairs to enter Secretary/administrator of Steps: 5 Entrance Stairs-Rails: Right;Left Home Layout: One level Home Equipment: Environmental consultant - 2 wheels;Shower seat Prior Function Level of Independence: Independent with assistive device(s) Communication Communication: No difficulties Dominant Hand: Right         Vision/Perception Vision - History Baseline Vision: Wears glasses all the time Patient Visual Report: Peripheral vision impairment (left)   Cognition  Cognition Arousal/Alertness: Awake/alert Behavior During Therapy: WFL for tasks assessed/performed Overall Cognitive Status: Within Functional Limits for tasks assessed    Extremity/Trunk Assessment Upper Extremity Assessment Upper Extremity Assessment: LUE deficits/detail LUE Deficits / Details: Strength and coordination WFL for ADL.  Reports inability to feel pain, light touch, and temperature.  Proprioception intact. LUE Sensation:  decreased light touch;decreased  proprioception Lower Extremity Assessment Lower Extremity Assessment: Defer to PT evaluation Cervical / Trunk Assessment Cervical / Trunk Assessment: Normal     Mobility Bed Mobility Bed Mobility: Supine to Sit;Sit to Supine Supine to Sit: 6: Modified independent (Device/Increase time);With rails;HOB elevated Sit to Supine: 6: Modified independent (Device/Increase time);HOB elevated Transfers Transfers: Sit to Stand;Stand to Sit Sit to Stand: 5: Supervision Stand to Sit: 5: Supervision     Exercise     Balance Balance Balance Assessed: Yes Static Sitting Balance Static Sitting - Balance Support: No upper extremity supported;Feet supported Static Sitting - Level of Assistance: 7: Independent Static Standing Balance Static Standing - Balance Support: No upper extremity supported Static Standing - Level of Assistance: 5: Stand by assistance   End of Session OT - End of Session Activity Tolerance: Patient tolerated treatment well Patient left: in bed;with call bell/phone within reach  GO     Evern Bio 11/28/2012, 2:04 PM [phone]

## 2012-11-28 NOTE — Progress Notes (Signed)
Physical Therapy Treatment Patient Details Name: [name] MRN: [id number] DOB: [birth year] Today's Date: 11/28/2012 Time: 0913-0930 PT Time Calculation (min): 17 min  PT Assessment / Plan / Recommendation  History of Present Illness Patient is a 47 yo female admitted with chest wall pain, Lt UE/side "tingling", headache, blurry vision, and Lt facial droop.   PT Comments   Patient demonstrates progress towards PT goals. Able to ambulate without hands on guard/assist. Educated patient regarding techniques for increasing sensation and nerve mobility. Patient responded well. Will continue to see and progress as tolerated.   Follow Up Recommendations  Home health PT;Supervision/Assistance - 24 hour           Equipment Recommendations  None recommended by PT    Recommendations for Other Services    Frequency Min 3X/week   Progress towards PT Goals Progress towards PT goals: Progressing toward goals  Plan Current plan remains appropriate    Precautions / Restrictions Precautions Precautions: Fall Restrictions Weight Bearing Restrictions: No   Pertinent Vitals/Pain Patient reports 7/10 headache at this time    Mobility  Bed Mobility Bed Mobility: Supine to Sit;Sitting - Scoot to Edge of Bed;Sit to Supine Supine to Sit: 5: Supervision;HOB elevated Sitting - Scoot to Edge of Bed: 5: Supervision Sit to Supine: 5: Supervision;HOB elevated Details for Bed Mobility Assistance: No cues needed.  Supervision for safety only Transfers Transfers: Sit to Stand;Stand to Sit Sit to Stand: 4: Min guard;With upper extremity assist;From bed Stand to Sit: 4: Min guard;With upper extremity assist;To bed Details for Transfer Assistance: Verbal cues for hand placement. Ambulation/Gait Ambulation/Gait Assistance: 5: Supervision Ambulation Distance (Feet): 180 Feet Assistive device: Rolling walker Ambulation/Gait Assistance Details: slow but steady, Gait Pattern: Step-through pattern;Decreased  step length - right;Decreased step length - left;Decreased stride length;Shuffle Gait velocity: Slow gait speed General Gait Details: multiple pauses to relax hands to regain sensation Modified Rankin (Stroke Patients Only) Pre-Morbid Rankin Score: Moderate disability Modified Rankin: Moderately severe disability      PT Goals (current goals can now be found in the care plan section) Acute Rehab PT Goals Patient Stated Goal: To get better PT Goal Formulation: With patient/family Time For Goal Achievement: 12/04/12 Potential to Achieve Goals: Good  Visit Information  Last PT Received On: 11/28/12 Assistance Needed: +1 History of Present Illness: Patient is a 47 yo female admitted with chest wall pain, Lt UE/side "tingling", headache, blurry vision, and Lt facial droop.    Subjective Data  Subjective: breakfast did not taste as good on the way up Patient Stated Goal: To get better   Cognition  Cognition Arousal/Alertness: Awake/alert Behavior During Therapy: WFL for tasks assessed/performed Overall Cognitive Status: Within Functional Limits for tasks assessed    Balance  Balance Balance Assessed: Yes Static Sitting Balance Static Sitting - Balance Support: No upper extremity supported;Feet supported Static Sitting - Level of Assistance: 5: Stand by assistance Static Sitting - Comment/# of Minutes: 4 Static Standing Balance Static Standing - Balance Support: No upper extremity supported Static Standing - Level of Assistance: 5: Stand by assistance  End of Session PT - End of Session Equipment Utilized During Treatment: Gait belt Activity Tolerance: Patient tolerated treatment well;Patient limited by fatigue Patient left: in bed;with call bell/phone within reach;with family/visitor present Nurse Communication: Mobility status   GP     Fabio Asa 11/28/2012, 10:42 AM Charlotte Crumb, PT DPT  [phone]

## 2012-11-28 NOTE — Progress Notes (Signed)
Subjective: [name] is a 47 y.o. F with PMH anxiety, multiple allergies who presents with left arm paresthesias.  Patient seen at bedside this AM. Still complains of HA this AM. Says her tingling and numbness is slightly better this morning. Denies any SOB or chest pain. Still has some nausea and photophobia associated with her HA.   MRI/MRA performed on 11/27/12, show no abnormalities. Carotid doppler shows 1-39% stenosis bilaterally. ECHO showed EF of 55-60% w/ no abnormalities.  Objective: Vital signs in last 24 hours: Filed Vitals:   11/27/12 2059 11/28/12 0000 11/28/12 0400 11/28/12 0757  BP:  139/85 123/66 130/92  Pulse:  79 64 81  Temp:  98.1 F (36.7 C) 98.2 F (36.8 C) 98.3 F (36.8 C)  TempSrc:  Oral Oral Oral  Resp:  18 18 20   Height:      Weight:      SpO2: 97% 97% 96% 100%   Weight change:   Intake/Output Summary (Last 24 hours) at 11/28/12 0820 Last data filed at 11/27/12 1805  Gross per 24 hour  Intake    682 ml  Output    800 ml  Net   -118 ml   Physical Exam: General: Alert, cooperative, and in no apparent distress HEENT: Vision grossly intact, however peripheral field decreased in left eye, oropharynx clear and non-erythematous  Neck: Full range of motion without pain, supple, no lymphadenopathy or carotid bruits Lungs: Clear to ascultation bilaterally, normal work of respiration, no wheezes, rales, ronchi Heart: Regular rate and rhythm, no murmurs, gallops, or rubs.  Abdomen: Soft, non-tender, non-distended, normal bowel sounds Extremities: No cyanosis, clubbing, or edema. Notes tingling in left upper and lower extremities. Neurologic: Alert & oriented X3, cranial nerves II-XII intact, strength grossly intact, sensation intact to light touch, however, patient still noted to have mild tingling sensation in upper and lower extremities. Mild dysfunction in finger-to-nose test on left side, as well as mildly decreased ability to perform rapid hand  movements on left side.   Lab Results: Basic Metabolic Panel:  Recent Labs Lab 11/26/12 1710  NA 143  K 3.9  CL 105  CO2 28  GLUCOSE 99  BUN 6  CREATININE 0.70  CALCIUM 9.3   CBC:  Recent Labs Lab 11/26/12 1710  WBC 7.4  NEUTROABS 3.5  HGB 14.2  HCT 41.8  MCV 92.7  PLT 391   Cardiac Enzymes:  Recent Labs Lab 11/27/12 0224 11/27/12 0800 11/27/12 1350  TROPONINI <0.30 <0.30 <0.30   BNP:  Recent Labs Lab 11/26/12 1710  PROBNP <5.0   CBG:  Recent Labs Lab 11/26/12 2146 11/27/12 0759 11/27/12 1140 11/27/12 1649 11/27/12 2053 11/28/12 0753  GLUCAP 111* 73 155* 82 140* 101*   Urinalysis:  Recent Labs Lab 11/26/12 1751  COLORURINE YELLOW  LABSPEC 1.021  PHURINE 6.5  GLUCOSEU NEGATIVE  HGBUR NEGATIVE  BILIRUBINUR NEGATIVE  KETONESUR NEGATIVE  PROTEINUR NEGATIVE  UROBILINOGEN 1.0  NITRITE NEGATIVE  LEUKOCYTESUR TRACE*   Micro Results: Recent Results (from the past 240 hour(s))  MRSA PCR SCREENING     Status: None   Collection Time    11/26/12 11:22 PM      Result Value Range Status   MRSA by PCR NEGATIVE  NEGATIVE Final   Comment:            The GeneXpert MRSA Assay (FDA     approved for NASAL specimens     only), is one component of a     comprehensive  MRSA colonization     surveillance program. It is not     intended to diagnose MRSA     infection nor to guide or     monitor treatment for     MRSA infections.   Studies/Results: Dg Chest 2 View  11/26/2012   *RADIOLOGY REPORT*  Clinical Data: Chest pain, left arm numbness, history hypertension  CHEST - 2 VIEW  Comparison: 09/17/2010  Findings: Minimal enlargement of cardiac silhouette. Mediastinal contours and pulmonary vascularity normal. Lungs clear. No pleural effusion or pneumothorax. Biconvex thoracolumbar scoliosis.  IMPRESSION: Minimal enlargement of cardiac silhouette. No acute abnormalities.   Original Report Authenticated By: Ulyses Southward, M.D.   Ct Head Wo  Contrast  11/26/2012   *RADIOLOGY REPORT*  Clinical Data:  Frontal headache.  Left facial droop occurring yesterday.  CT HEAD WITHOUT CONTRAST  Technique:  Contiguous axial images were obtained from the base of the skull through the vertex without contrast  Comparison:  MR head 05/22/2008.  CT head 11/02/2006.  Findings:  The brain has a normal appearance without evidence for hemorrhage, acute infarction, hydrocephalus, or mass lesion.  There is no extra axial fluid collection.  The skull and paranasal sinuses are normal. No change from priors.  IMPRESSION: Normal CT of the head without contrast.   Original Report Authenticated By: Davonna Belling, M.D.   Mr [facility] Head Wo Contrast  11/27/2012   *RADIOLOGY REPORT*  Clinical Data:  Left-sided paresthesias.  Decreased sensation. Headache. Stroke risk factors include history of hypertension.  MRI HEAD WITHOUT CONTRAST MRA HEAD WITHOUT CONTRAST  Technique:  Multiplanar, multiecho pulse sequences of the brain and surrounding structures were obtained without intravenous contrast. Angiographic images of the head were obtained using MRA technique without contrast.  Comparison:  CT head 11/26/2012.  MRI HEAD  Findings:  No acute stroke or hemorrhage.  No mass lesion or hydrocephalus.  No extra-axial fluid.  Normal cerebral volume.  Minimal nonspecific periventricular white matter signal abnormality, primarily bilateral occipital, possible chronic microvascular ischemic change.  No ventricular enlargement to suggest transependymal absorption.  No brainstem or cerebellar edema to suggest PRES.  Normal calvarium and skull base.  Unremarkable pituitary and cerebellar tonsils.  No upper cervical compressive lesion is evident.  Mild chronic sinus disease.  Negative mastoids and orbits.  Flow voids are maintained throughout the major intracranial vascular structures.  IMPRESSION: Minimal nonspecific periventricular white matter signal abnormality, possible mild small vessel disease.   No acute intracranial findings. No areas of acute infarction within the intracranial pathways subserving sensation.  MRA HEAD  Findings: Internal carotid arteries are widely patent.  Basilar artery is widely patent with the right vertebral as the sole contributor.  Left vertebral ends in PICA.  No intracranial stenosis or aneurysm.  IMPRESSION: Unremarkable MRA intracranial circulation.   Original Report Authenticated By: Davonna Belling, M.D.   Mr Brain Wo Contrast  11/27/2012   *RADIOLOGY REPORT*  Clinical Data:  Left-sided paresthesias.  Decreased sensation. Headache. Stroke risk factors include history of hypertension.  MRI HEAD WITHOUT CONTRAST MRA HEAD WITHOUT CONTRAST  Technique:  Multiplanar, multiecho pulse sequences of the brain and surrounding structures were obtained without intravenous contrast. Angiographic images of the head were obtained using MRA technique without contrast.  Comparison:  CT head 11/26/2012.  MRI HEAD  Findings:  No acute stroke or hemorrhage.  No mass lesion or hydrocephalus.  No extra-axial fluid.  Normal cerebral volume.  Minimal nonspecific periventricular white matter signal abnormality, primarily bilateral occipital, possible chronic microvascular  ischemic change.  No ventricular enlargement to suggest transependymal absorption.  No brainstem or cerebellar edema to suggest PRES.  Normal calvarium and skull base.  Unremarkable pituitary and cerebellar tonsils.  No upper cervical compressive lesion is evident.  Mild chronic sinus disease.  Negative mastoids and orbits.  Flow voids are maintained throughout the major intracranial vascular structures.  IMPRESSION: Minimal nonspecific periventricular white matter signal abnormality, possible mild small vessel disease.  No acute intracranial findings. No areas of acute infarction within the intracranial pathways subserving sensation.  MRA HEAD  Findings: Internal carotid arteries are widely patent.  Basilar artery is widely patent  with the right vertebral as the sole contributor.  Left vertebral ends in PICA.  No intracranial stenosis or aneurysm.  IMPRESSION: Unremarkable MRA intracranial circulation.   Original Report Authenticated By: Davonna Belling, M.D.   Medications: I have reviewed the patient's current medications. Scheduled Meds: . albuterol  2 puff Inhalation BID  . aspirin  81 mg Oral Daily  . budesonide-formoterol  2 puff Inhalation BID  . clonazePAM  1 mg Oral QHS  . fluticasone  2 spray Each Nare Daily  . ondansetron (ZOFRAN) IV  4 mg Intravenous Once   Continuous Infusions:  PRN Meds:.albuterol, ketorolac, nitroGLYCERIN, prochlorperazine, temazepam Assessment/Plan: [name] is a 47 y.o. F with PMH anxiety, multiple allergies who presents with left arm paresthesias x24 hours prior to admission.   #Left-sided paresthesias - Pateint presents with notable left upper and lower extremity tingling. She was also noted to have dysfunction in finger-to-nose testing and rapid hand movement testing on the LUE. Also has decreased peripheral visual field deficit in the left side. Head CT was negative in the ED. MRI/MRA performed on 11/27/12, shows no ischemic abnormalities. Carotid dopplers show 1-39% stenosis (-ve). ECHO from yesterday revealed normal EF and no abnormalities. Her symptoms are most likely d/t complex migraine headache. Discussed with Dr. Pearlean Brownie this AM.  - Gave Imitrex 6mg  subQ inj which did not provide relief. The next step was to give 1000 mg Depacon IV which did in fact give her some relief for her HA. Recommendation is to give patient Depakote 500 mg qd at home for Migraine relief. - Appreciate neuro recs  - Continue w/ telemetry monitoring - Frequent neuro checks  - Continue ASA 81mg   - Continue compazine, toradol to treat possible migraine  - PT/OT recommendations suggest HHPT given her unsteadiness. SLP saw patient on 11/26/12, started on regular diet. - HbA1C found to be 5.7. - UDS performed  yesterday, only +ve for benzos, which she has been given here in the hospital.  #Chest pain - Sharp in nature, localized to the chest wall, reproducible with palpation, occurs with movement. Likely musculoskeletal in nature. Troponin negative x3. Patient with T wave inversions in inferior leads, but these were present in a prior EKG from >1 year ago. Ischemia unlikely at this time. Today she reports no chest pain. - Repeat EKG on 11/27/12 shows no dynamic changes from previous EKG.  #Vomiting and diarrhea - No episodes presently.  - Prochlorperazine prn nausea  - Continue to monitor and treat as needed   #Anxiety - Patient with somewhat anxious affect. Less so during the day today. - Continuing home clonazepam and temazepam. Only required one dose of Klonopin last night.  - Will clarify her regimen and any recent changes that could have precipitated symptoms   #DVT PPX - SCDs  Dispo: Disposition is deferred at this time, awaiting improvement of current medical  problems.  Anticipated discharge in approximately 1-2 day(s).   The patient does have a current PCP Aura Dials, MD) and does need an [facility] hospital follow-up appointment after discharge.  The patient does not have transportation limitations that hinder transportation to clinic appointments.  .Services Needed at time of discharge: Y = Yes, Blank = No PT:   OT:   RN:   Equipment:   Other:     LOS: 2 days   Lars Masson, MD 11/28/2012, 8:20 AM Pager: [phone]

## 2012-11-29 DIAGNOSIS — R51 Headache: Secondary | ICD-10-CM

## 2012-11-29 LAB — GLUCOSE, CAPILLARY: Glucose-Capillary: 105 mg/dL — ABNORMAL HIGH (ref 70–99)

## 2012-11-29 LAB — BASIC METABOLIC PANEL
CO2: 25 mEq/L (ref 19–32)
Chloride: 105 mEq/L (ref 96–112)
Creatinine, Ser: 0.63 mg/dL (ref 0.50–1.10)
Sodium: 140 mEq/L (ref 135–145)

## 2012-11-29 MED ORDER — DIVALPROEX SODIUM ER 500 MG PO TB24: 500.0000 mg | ORAL_TABLET | Freq: Every day | ORAL | Status: DC

## 2012-11-29 NOTE — Discharge Summary (Signed)
Name: [name] MRN: [id number] DOB: 1966-02-01 47 y.o. PCP: Aura Dials, MD  Date of Admission: 11/26/2012  4:15 PM Date of Discharge: 11/29/2012 Attending Physician: Farley Ly, MD  Discharge Diagnosis: 1. Left-sided paresthesias- Patient presented w/ sensory disturbances on the left side of her body involving numbness, tingling, decreased left-sided peripheral vision. Stroke ruled out. Findings suggestive of complex migraine headache. 2. Chest pain- ACS ruled out. 3. Anxiety/depression 4. Multiple allergies  Discharge Medications:   Medication List         albuterol (5 MG/ML) 0.5% nebulizer solution  Commonly known as:  PROVENTIL  Take 5 mg by nebulization 2 (two) times daily.     azelastine 137 MCG/SPRAY nasal spray  Commonly known as:  ASTELIN  Place 1 spray into the nose 2 (two) times daily. Use in each nostril as directed     budesonide-formoterol 160-4.5 MCG/ACT inhaler  Commonly known as:  SYMBICORT  Inhale 2 puffs into the lungs 4 (four) times daily.     clonazePAM 1 MG tablet  Commonly known as:  KLONOPIN  Take 1 mg by mouth at bedtime.     divalproex 500 MG 24 hr tablet  Commonly known as:  DEPAKOTE ER  Take 1 tablet (500 mg total) by mouth daily.     EPINEPHrine 0.3 mg/0.3 mL Devi  Commonly known as:  EPI-PEN  Inject 0.3 mg into the muscle daily as needed. For severe allergic reaction     fluticasone 50 MCG/ACT nasal spray  Commonly known as:  FLONASE  Place 2 sprays into the nose daily.     hydrOXYzine 25 MG tablet  Commonly known as:  ATARAX/VISTARIL  Take 25 mg by mouth at bedtime.     multivitamin with minerals tablet  Take 1 tablet by mouth daily.     polycarbophil 625 MG tablet  Commonly known as:  FIBERCON  Take 625 mg by mouth daily.     ranitidine 150 MG tablet  Commonly known as:  ZANTAC  Take 150 mg by mouth daily.     temazepam 7.5 MG capsule  Commonly known as:  RESTORIL  Take 7.5 mg by mouth at bedtime as needed.  For insomnia        Disposition and follow-up:   [name] was discharged from [facility] in good condition.  At the hospital follow up visit please address:  1.  Effectiveness of Depakote ER against headaches. Does patient still have left sided paresthesias? Assess extent of benzo use lately. Has she had recent headaches? Photophobia?   2.  Labs / imaging needed at time of follow-up: none  3.  Pending labs/ test needing follow-up: none  Follow-up Appointments:     Follow-up Information   Follow up with Gates Rigg, MD On 12/29/2012. (1:45 PM)    Specialties:  Neurology, Radiology   Contact information:   [address] [place] [address] [phone]       Follow up with Verlon Au, MD On 12/09/2012. (9:30 AM)    Specialty:  Family Medicine   Contact information:   [address] [place] [address] [phone]       Discharge Instructions: Discharge Orders   Future Appointments Provider Department Dept Phone   12/29/2012 2:00 PM Micki Riley, MD [facility] [phone]   Future Orders Complete By Expires   Call MD for:  persistant dizziness or light-headedness  As directed  Call MD for:  persistant nausea and vomiting  As directed    Diet - low sodium heart healthy  As directed    Increase activity slowly  As directed       Consultations:  Neurology  Procedures Performed:  Dg Chest 2 View  11/26/2012   *RADIOLOGY REPORT*  Clinical Data: Chest pain, left arm numbness, history hypertension  CHEST - 2 VIEW  Comparison: 09/17/2010  Findings: Minimal enlargement of cardiac silhouette. Mediastinal contours and pulmonary vascularity normal. Lungs clear. No pleural effusion or pneumothorax. Biconvex thoracolumbar scoliosis.  IMPRESSION: Minimal enlargement of cardiac silhouette. No acute abnormalities.   Original Report Authenticated By: Ulyses Southward, M.D.   Ct Head  Wo Contrast  11/26/2012   *RADIOLOGY REPORT*  Clinical Data:  Frontal headache.  Left facial droop occurring yesterday.  CT HEAD WITHOUT CONTRAST  Technique:  Contiguous axial images were obtained from the base of the skull through the vertex without contrast  Comparison:  MR head 05/22/2008.  CT head 11/02/2006.  Findings:  The brain has a normal appearance without evidence for hemorrhage, acute infarction, hydrocephalus, or mass lesion.  There is no extra axial fluid collection.  The skull and paranasal sinuses are normal. No change from priors.  IMPRESSION: Normal CT of the head without contrast.   Original Report Authenticated By: Davonna Belling, M.D.   Mr [facility] Head Wo Contrast  11/27/2012   *RADIOLOGY REPORT*  Clinical Data:  Left-sided paresthesias.  Decreased sensation. Headache. Stroke risk factors include history of hypertension.  MRI HEAD WITHOUT CONTRAST MRA HEAD WITHOUT CONTRAST  Technique:  Multiplanar, multiecho pulse sequences of the brain and surrounding structures were obtained without intravenous contrast. Angiographic images of the head were obtained using MRA technique without contrast.  Comparison:  CT head 11/26/2012.  MRI HEAD  Findings:  No acute stroke or hemorrhage.  No mass lesion or hydrocephalus.  No extra-axial fluid.  Normal cerebral volume.  Minimal nonspecific periventricular white matter signal abnormality, primarily bilateral occipital, possible chronic microvascular ischemic change.  No ventricular enlargement to suggest transependymal absorption.  No brainstem or cerebellar edema to suggest PRES.  Normal calvarium and skull base.  Unremarkable pituitary and cerebellar tonsils.  No upper cervical compressive lesion is evident.  Mild chronic sinus disease.  Negative mastoids and orbits.  Flow voids are maintained throughout the major intracranial vascular structures.  IMPRESSION: Minimal nonspecific periventricular white matter signal abnormality, possible mild small vessel  disease.  No acute intracranial findings. No areas of acute infarction within the intracranial pathways subserving sensation.  MRA HEAD  Findings: Internal carotid arteries are widely patent.  Basilar artery is widely patent with the right vertebral as the sole contributor.  Left vertebral ends in PICA.  No intracranial stenosis or aneurysm.  IMPRESSION: Unremarkable MRA intracranial circulation.   Original Report Authenticated By: Davonna Belling, M.D.   Mr Brain Wo Contrast  11/27/2012   *RADIOLOGY REPORT*  Clinical Data:  Left-sided paresthesias.  Decreased sensation. Headache. Stroke risk factors include history of hypertension.  MRI HEAD WITHOUT CONTRAST MRA HEAD WITHOUT CONTRAST  Technique:  Multiplanar, multiecho pulse sequences of the brain and surrounding structures were obtained without intravenous contrast. Angiographic images of the head were obtained using MRA technique without contrast.  Comparison:  CT head 11/26/2012.  MRI HEAD  Findings:  No acute stroke or hemorrhage.  No mass lesion or hydrocephalus.  No extra-axial fluid.  Normal cerebral volume.  Minimal nonspecific periventricular white matter signal abnormality, primarily bilateral occipital, possible chronic  microvascular ischemic change.  No ventricular enlargement to suggest transependymal absorption.  No brainstem or cerebellar edema to suggest PRES.  Normal calvarium and skull base.  Unremarkable pituitary and cerebellar tonsils.  No upper cervical compressive lesion is evident.  Mild chronic sinus disease.  Negative mastoids and orbits.  Flow voids are maintained throughout the major intracranial vascular structures.  IMPRESSION: Minimal nonspecific periventricular white matter signal abnormality, possible mild small vessel disease.  No acute intracranial findings. No areas of acute infarction within the intracranial pathways subserving sensation.  MRA HEAD  Findings: Internal carotid arteries are widely patent.  Basilar artery is widely  patent with the right vertebral as the sole contributor.  Left vertebral ends in PICA.  No intracranial stenosis or aneurysm.  IMPRESSION: Unremarkable MRA intracranial circulation.   Original Report Authenticated By: Davonna Belling, M.D.    2D Echo: 11/27/12 Study Conclusions: Left ventricle: The cavity size was normal. Wall thickness was normal. Systolic function was normal. The estimated ejection fraction was in the range of 55% to 60%.  Admission HPI:  [name] is a 47 y.o. F with PMH anxiety, multiple allergies who presents with left arm paresthesias. Her symptoms began suddenly about 24 hours ago as a "tingling" feeling in her entire left side, worse in the left arm, which felt like it had "fallen asleep". At that time she also recalls a left facial droop causing drooling, and blurry vision out of her left eye. However, at interview all of these symptoms had resolved except for the tingling sensation in her left side. She denies frank weakness then or now. No speech changes or word-finding difficulties. Some gait instability, but she admits she is unstable at baseline 2/2 knee pain. Denies dizziness. She does endorse a frontal headache x2 days. Denies a personal history of migraines, but states "I get headaches whenever one of my kids is pregnant." She has no personal history of stroke or TIA. There is a family history of stroke in her grandmother. She has never smoked tobacco. Denies alcohol or drug use.  On review of systems, she does endorse chest pain, pinpointed to the left 5th intercostal space, sharp in nature, most painful with direct palpation and with rotating her trunk to the right. She also endorses NBNB vomiting x2weeks, up to 3 times a day. She has also had NB diarrhea during this 2 week timeframe, occuring "whenever she uses the bathroom." She has taken no new medications to treat any of these issues. Denies SOB, abdominal pain, changes in urinary habits.  Hospital Course by  problem list:  1. Left-sided paresthesias- Patient presented w/ new onset of left-sided numbness and tingling. She said this started about a day prior to her admission. She also had associated vision change out of her left eye as well as headaches. On further questioning, the patient described recent feelings of photophobia, nausea and vomiting associated with these headaches, that she describes as very excruciating in nature. On physical exam, the patient endorsed a "pins and needles" sensation on her entire left side when testing sensation. Motor function was intact. Examination of CN's, all were generally intact, however, peripheral vision was decreased on the left side. On examination of cerebellar function, patient showed some deficiencies w/ finger to nose testing and rapid hand movements w/ the left hand. On presentation, there was obvious concern for CVA. Neurology was also consulted. CT head was performed which showed no acute abnormalities. ECHO, carotid dopplers and MRI were all performed as well, showing  no abnormalities (see detailed results above). After these results, it was suspected that the patient's findings and complaints were most likely due to a complex migraine headache. The pain was well controlled by toradol during the beginning of her admission, nausea controlled by Zofran and Compazine prn. Neurology recommendations suggested giving an injection of Imitrex 6 mg subQ and to reassess in an hour. This did not seem to relieve her pain, so the next step suggested by neurology was to give 1000 mg Depacon IV. This brought the patient's headache down to a "bearable" 4/10. The patient began to feel much better. Her nausea subsided, numbness and tingling decreased on the left side (did not completely disappear). She was discharged in a much better state and instructed to continue w/ Depakote ER 500 mg qd for migraine relief and prophylaxis until her neurology follow-up appointment w/ Dr.  Pearlean Brownie.  2. Chest pain- [name] also presented with complaints of chest pain, localized to the left side of her chest underneath her breast. On examination, it was found that the pain was reproducible to palpation of the chest wall and rotation of the thorax. She did not complain of SOB, palpitations, dizziness, or lightheadedness. Even though the patient seemed to be complaining of a chest wall/musculoskeletal pain, ACS and cardiac related chest pain had to be ruled out. EKG showed no dynamic changes compared to previous EKGs and troponins x3 were negative. ECHO performed showed EF of 55-60% w/ no wall motion abnormalities. Her chest pain seemed to improve during the course of her admission and was most likely 2/2 musculoskeletal pain.   3. Anxiety/depression- Patient presented with a somewhat anxious affect. It seems that she takes rather high doses of benzodiazepines at home, claiming that she may take up to 4 mg of Klonopin in a day. She also takes Restoril to aid with sleep. Her home meds were continued during her hospital stay, and given prn for anxiety, however the patient did not take her described home dose while in the hospital.  4. Multiple allergies- The patient had many serious allergies resulting in anaphylaxis prior to her admission to the hospital, including bananas, onions, codeine, hydrocodone, and penicillins. An Epi-pen was kept at bedside during the course of her admission. No issues w/ allergies/anaphylaxis during this admission.  Discharge Vitals:   BP 111/82  Pulse 99  Temp(Src) 98.4 F (36.9 C) (Oral)  Resp 20  Ht 4\' 11"  (1.499 m)  Wt 184 lb (83.462 kg)  BMI 37.14 kg/m2  SpO2 96%  Discharge Labs:  Results for orders placed during the hospital encounter of 11/26/12 (from the past 24 hour(s))  GLUCOSE, CAPILLARY     Status: Abnormal   Collection Time    11/28/12  9:23 PM      Result Value Range   Glucose-Capillary 139 (*) 70 - 99 mg/dL  BASIC METABOLIC PANEL      Status: None   Collection Time    11/29/12  5:30 AM      Result Value Range   Sodium 140  135 - 145 mEq/L   Potassium 4.7  3.5 - 5.1 mEq/L   Chloride 105  96 - 112 mEq/L   CO2 25  19 - 32 mEq/L   Glucose, Bld 87  70 - 99 mg/dL   BUN 12  6 - 23 mg/dL   Creatinine, Ser 1.19  0.50 - 1.10 mg/dL   Calcium 9.1  8.4 - 14.7 mg/dL   GFR calc non Af Amer >90  >  90 mL/min   GFR calc Af Amer >90  >90 mL/min  GLUCOSE, CAPILLARY     Status: Abnormal   Collection Time    11/29/12 11:54 AM      Result Value Range   Glucose-Capillary 105 (*) 70 - 99 mg/dL    Signed: Lars Masson, MD 11/29/2012, 5:19 PM   Time Spent on Discharge: 35 minutes Services Ordered on Discharge: none Equipment Ordered on Discharge: none

## 2012-11-29 NOTE — Progress Notes (Signed)
Physical Therapy Treatment Patient Details Name: [name] MRN: [id number] DOB: 08-23-1965 Today's Date: 11/29/2012 Time: 2725-3664 PT Time Calculation (min): 17 min  PT Assessment / Plan / Recommendation  History of Present Illness Patient is a 47 yo female admitted with chest wall pain, Lt UE/side "tingling", headache, blurry vision, and Lt facial droop.   PT Comments   Patient continues to demonstrate improvements in functional mobility. Patient able to increase ambulation and did not require any physical assist. Feel patient is progressing well, will continue to see as indicated and progress activity as tolerated.   Follow Up Recommendations  Home health PT;Supervision/Assistance - 24 hour           Equipment Recommendations  None recommended by PT       Frequency Min 3X/week   Progress towards PT Goals Progress towards PT goals: Progressing toward goals  Plan Current plan remains appropriate    Precautions / Restrictions Precautions Precautions: Fall Restrictions Weight Bearing Restrictions: No   Pertinent Vitals/Pain Patient reports no pain at this time    Mobility  Bed Mobility Bed Mobility: Supine to Sit;Sitting - Scoot to Edge of Bed;Sit to Supine Supine to Sit: 5: Supervision;HOB elevated Sitting - Scoot to Edge of Bed: 5: Supervision Sit to Supine: 5: Supervision;HOB elevated Details for Bed Mobility Assistance: No cues needed.  Supervision for safety only Transfers Transfers: Sit to Stand;Stand to Sit Sit to Stand: 6: Modified independent (Device/Increase time) Stand to Sit: 6: Modified independent (Device/Increase time) Details for Transfer Assistance: No assist or cues needed, RW upon standing Ambulation/Gait Ambulation/Gait Assistance: 6: Modified independent (Device/Increase time) Ambulation Distance (Feet): 320 Feet Assistive device: Rolling walker Ambulation/Gait Assistance Details: improved gait speed and distance today, no VCs, only one stop  for sensation deficits Gait Pattern: Step-through pattern;Decreased step length - right;Decreased step length - left;Decreased stride length;Shuffle Gait velocity: Slow gait speed General Gait Details: multiple pauses to relax hands to regain sensation Modified Rankin (Stroke Patients Only) Pre-Morbid Rankin Score: Moderate disability Modified Rankin: Moderate disability      PT Goals (current goals can now be found in the care plan section) Acute Rehab PT Goals Patient Stated Goal: To get better PT Goal Formulation: With patient/family Time For Goal Achievement: 12/04/12 Potential to Achieve Goals: Good  Visit Information  Last PT Received On: 11/29/12 Assistance Needed: +1 History of Present Illness: Patient is a 47 yo female admitted with chest wall pain, Lt UE/side "tingling", headache, blurry vision, and Lt facial droop.    Subjective Data  Subjective: breakfast did not taste as good on the way up Patient Stated Goal: To get better   Cognition  Cognition Arousal/Alertness: Awake/alert Behavior During Therapy: WFL for tasks assessed/performed Overall Cognitive Status: Within Functional Limits for tasks assessed    Balance  Balance Balance Assessed: Yes Static Sitting Balance Static Sitting - Balance Support: No upper extremity supported;Feet supported Static Sitting - Level of Assistance: 5: Stand by assistance Static Standing Balance Static Standing - Balance Support: No upper extremity supported Static Standing - Level of Assistance: 5: Stand by assistance Static Standing - Comment/# of Minutes: 2 minutes   End of Session PT - End of Session Equipment Utilized During Treatment: Gait belt Activity Tolerance: Patient tolerated treatment well;Patient limited by fatigue Patient left: in bed;with call bell/phone within reach;with family/visitor present Nurse Communication: Mobility status   GP     Fabio Asa 11/29/2012, 9:22 AM Charlotte Crumb, PT DPT   [phone]

## 2012-11-29 NOTE — Progress Notes (Signed)
Stroke Team Progress Note  HISTORY [name] is a 47 y.o. female who had been having a headache for 2 days, and subsequently developed sudden onset of left-sided paresthesias with decreased sensation on 11/25/2012. She has been static since that time. She had not noticed any changes in her vision. She had not noticed any true weakness. The patient was not a TPA candidate due to delay in arrival. She was admitted for further stroke evaluation, which was negative, and her symptoms were thought to be 2/2 migraines.   SUBJECTIVE No complaints. Headache improved with Depakote.   OBJECTIVE Most recent Vital Signs: Filed Vitals:   11/28/12 1604 11/28/12 2000 11/28/12 2047 11/29/12 0400  BP: 136/82 123/86  119/76  Pulse: 82 86  84  Temp: 98.4 F (36.9 C) 98.7 F (37.1 C)  98 F (36.7 C)  TempSrc: Oral Oral  Oral  Resp: 19 18  18   Height:      Weight:      SpO2: 99% 99% 98% 96%   CBG (last 3)   Recent Labs  11/28/12 1204 11/28/12 1621 11/28/12 2123  GLUCAP 94 124* 139*    IV Fluid Intake:     MEDICATIONS  . albuterol  2 puff Inhalation BID  . budesonide-formoterol  2 puff Inhalation BID  . clonazePAM  1 mg Oral QHS  . divalproex  500 mg Oral Daily  . fluticasone  2 spray Each Nare Daily   PRN:  albuterol, ketorolac, nitroGLYCERIN, prochlorperazine, temazepam  Diet:  Cardiac thin liquids Activity:  Bathroom privileges with assistance. DVT Prophylaxis:  SCDs  CLINICALLY SIGNIFICANT STUDIES Basic Metabolic Panel:   Recent Labs Lab 11/26/12 1710 11/29/12 0530  NA 143 140  K 3.9 4.7  CL 105 105  CO2 28 25  GLUCOSE 99 87  BUN 6 12  CREATININE 0.70 0.63  CALCIUM 9.3 9.1   CBC:   Recent Labs Lab 11/26/12 1710  WBC 7.4  NEUTROABS 3.5  HGB 14.2  HCT 41.8  MCV 92.7  PLT 391   Cardiac Enzymes:   Recent Labs Lab 11/27/12 0224 11/27/12 0800 11/27/12 1350  TROPONINI <0.30 <0.30 <0.30   Urinalysis:   Recent Labs Lab 11/26/12 1751  COLORURINE YELLOW   LABSPEC 1.021  PHURINE 6.5  GLUCOSEU NEGATIVE  HGBUR NEGATIVE  BILIRUBINUR NEGATIVE  KETONESUR NEGATIVE  PROTEINUR NEGATIVE  UROBILINOGEN 1.0  NITRITE NEGATIVE  LEUKOCYTESUR TRACE*   Lipid Panel    Component Value Date/Time   CHOL 173 11/27/2012 0500   TRIG 128 11/27/2012 0500   HDL 49 11/27/2012 0500   CHOLHDL 3.5 11/27/2012 0500   VLDL 26 11/27/2012 0500   LDLCALC 98 11/27/2012 0500   HgbA1C  Lab Results  Component Value Date   HGBA1C 5.7* 11/26/2012    Urine Drug Screen:      Component Value Date/Time   LABOPIA NONE DETECTED 11/27/2012 2257    Alcohol Level: No results found for this basename: ETH,  in the last 168 hours   CT Head Wo Contrast  11/26/2012   Normal CT of the head without contrast.     MRI of the brain  11/27/2012    Minimal nonspecific periventricular white matter signal abnormality, possible mild small vessel disease.  No acute intracranial findings. No areas of acute infarction within the intracranial pathways subserving sensation.   MRA of the brain  11/27/2012   Unremarkable MRA intracranial circulation.    2D Echocardiogram 11/27/12  EF 55-60%, normal systolic function. Normal wall thickness.  Carotid Doppler  No evidence of hemodynamically significant internal carotid artery stenosis. Vertebral artery flow is antegrade.   EKG   sinus rhythm rate 73 beats per minute  Dg Chest 11/26/2012   Minimal enlargement of cardiac silhouette. No acute abnormalities.     Therapy Recommendations HH PT  Physical Exam   General: Up and ambulating in the halls, NAD  CV: Regular in rhythm  Resp: Respirations equal Neuro: Awake  Alert oriented x 3. Normal speech and language. Eye movements full without nystagmus. Fundi were not visualized. Vision acuity and fields appear normal. Hearing is normal. Palatal movements are normal. Face symmetric. Tongue midline. Normal strength, tone, reflexes and coordination. Normal sensation. Gait normal.   ASSESSMENT [name] is a 47 y.o. female presenting with a headache, left hemisensory paresthesias and left visual field deficit. TPA was not given as the patient missed the therapeutic window.  Imaging is negative for acute infarct. Dx: complicated migraine, no stroke or TIA dx in the setting of atypical chest pain. On no antithrombotics prior to admission, and was placed on aspirin 81 mg orally every day for secondary stroke prevention on admission, which was discontinued as she has no documented risk factors. Patient's resultant left field cut and left sided numbness, likely 2/2 migraine, refractory to Imitrex, but improved with depakote. Work up completed.   Low normal blood pressure. ( History of hypertension )  Multiple allergies  Anxiety / depression  ? Asthma  Migraine  Hospital day # 3  TREATMENT/PLAN  Continue depakote ER 500 mg daily.   Home health PT recommended by therapy   Stroke team will sign off, please have the patient follow up with Neurology in 1-2 months, at that time, her Depakote will need to be evaluated and possibly discontinued.    Genelle Gather, MD Internal Medicine Resident, PGY II [facility] Internal Medicine Program 11/29/2012 8:18 AM    I have personally obtained a history, examined the patient, evaluated imaging results, and formulated the assessment and plan of care. I agree with the above.  Delia Heady, MD

## 2012-11-29 NOTE — Progress Notes (Signed)
Internal Medicine Attending  Date: 11/29/2012  Patient name: [name] Medical record number: [id number] Date of birth: 1965-05-14 Age: 47 y.o. Gender: female  I saw and evaluated the patient. I reviewed the resident's note by Dr. Yetta Barre and I agree with the resident's findings and plans as documented in his note.

## 2012-11-29 NOTE — Progress Notes (Signed)
Subjective: Ms. Byrdie Miyazaki is a 47 y.o. F with PMH anxiety, multiple allergies who presents with left arm paresthesias.  Patient seen at bedside this AM. York Spaniel she is feeling much better today. Had some nausea this AM but it had since subsided. Her HA is less than before. Still notes some numbness and tingling on her left side, but it is improving. Felt some relief w/ Depacon IV yesterday.  MRI/MRA performed on 11/27/12, show no abnormalities. Carotid doppler shows 1-39% stenosis bilaterally. ECHO showed EF of 55-60% w/ no abnormalities.  Objective: Vital signs in last 24 hours: Filed Vitals:   11/28/12 2000 11/28/12 2047 11/29/12 0400 11/29/12 0852  BP: 123/86  119/76   Pulse: 86  84   Temp: 98.7 F (37.1 C)  98 F (36.7 C)   TempSrc: Oral  Oral   Resp: 18  18   Height:      Weight:      SpO2: 99% 98% 96% 96%   Weight change:   Intake/Output Summary (Last 24 hours) at 11/29/12 1027 Last data filed at 11/28/12 2200  Gross per 24 hour  Intake    490 ml  Output      0 ml  Net    490 ml   Physical Exam: General: Alert, cooperative, and in no apparent distress HEENT: Vision grossly intact, peripheral field still somewhat decreased in left eye, oropharynx clear and non-erythematous  Neck: Full range of motion without pain, supple, no lymphadenopathy or carotid bruits Lungs: Clear to ascultation bilaterally, normal work of respiration, no wheezes, rales, ronchi Heart: Regular rate and rhythm, no murmurs, gallops, or rubs.  Abdomen: Soft, non-tender, non-distended, normal bowel sounds Extremities: No cyanosis, clubbing, or edema. Notes tingling in left upper and lower extremities. Neurologic: Alert & oriented X3, cranial nerves II-XII intact, strength grossly intact, sensation intact to light touch, however, patient still noted to have mild tingling sensation in upper and lower extremities.   Lab Results: Basic Metabolic Panel:  Recent Labs Lab 11/26/12 1710 11/29/12 0530    NA 143 140  K 3.9 4.7  CL 105 105  CO2 28 25  GLUCOSE 99 87  BUN 6 12  CREATININE 0.70 0.63  CALCIUM 9.3 9.1   CBC:  Recent Labs Lab 11/26/12 1710  WBC 7.4  NEUTROABS 3.5  HGB 14.2  HCT 41.8  MCV 92.7  PLT 391   Cardiac Enzymes:  Recent Labs Lab 11/27/12 0224 11/27/12 0800 11/27/12 1350  TROPONINI <0.30 <0.30 <0.30   BNP:  Recent Labs Lab 11/26/12 1710  PROBNP <5.0   CBG:  Recent Labs Lab 11/27/12 1649 11/27/12 2053 11/28/12 0753 11/28/12 1204 11/28/12 1621 11/28/12 2123  GLUCAP 82 140* 101* 94 124* 139*   Urinalysis:  Recent Labs Lab 11/26/12 1751  COLORURINE YELLOW  LABSPEC 1.021  PHURINE 6.5  GLUCOSEU NEGATIVE  HGBUR NEGATIVE  BILIRUBINUR NEGATIVE  KETONESUR NEGATIVE  PROTEINUR NEGATIVE  UROBILINOGEN 1.0  NITRITE NEGATIVE  LEUKOCYTESUR TRACE*   Micro Results: Recent Results (from the past 240 hour(s))  MRSA PCR SCREENING     Status: None   Collection Time    11/26/12 11:22 PM      Result Value Range Status   MRSA by PCR NEGATIVE  NEGATIVE Final   Comment:            The GeneXpert MRSA Assay (FDA     approved for NASAL specimens     only), is one component of a  comprehensive MRSA colonization     surveillance program. It is not     intended to diagnose MRSA     infection nor to guide or     monitor treatment for     MRSA infections.   Studies/Results: No results found. Medications: I have reviewed the patient's current medications. Scheduled Meds: . albuterol  2 puff Inhalation BID  . budesonide-formoterol  2 puff Inhalation BID  . clonazePAM  1 mg Oral QHS  . divalproex  500 mg Oral Daily  . fluticasone  2 spray Each Nare Daily   Continuous Infusions:  PRN Meds:.albuterol, ketorolac, nitroGLYCERIN, prochlorperazine, temazepam Assessment/Plan: Ms. Robyn Galati is a 47 y.o. F with PMH anxiety, multiple allergies who presents with left arm paresthesias x24 hours prior to admission.   #Left-sided paresthesias -  Pateint presents with notable left upper and lower extremity tingling. She was also noted to have dysfunction in finger-to-nose testing and rapid hand movement testing on the LUE. Also has decreased peripheral visual field deficit in the left side. Head CT was negative in the ED. MRI/MRA performed on 11/27/12, shows no ischemic abnormalities. Carotid dopplers show 1-39% stenosis (-ve). ECHO from 11/27/12 revealed normal EF and no abnormalities. Her symptoms are most likely d/t complex migraine headache. Discussed with Dr. Pearlean Brownie.  - Gave Imitrex 6mg  subQ inj yesterday which did not provide relief. The next step was to give 1000 mg Depacon IV which did in fact give her some relief for her HA. Recommendation is to give patient Depakote 500 mg qd at home for Migraine relief. Will continue until patient follows up w/ Dr. Pearlean Brownie next month. - Appreciate neuro recs  - Discontinued ASA 81mg   - Continue compazine, toradol to treat possible migraine  - PT/OT recommendations suggest HHPT given her unsteadiness.  - HbA1C found to be 5.7.  #Chest pain - Sharp in nature, localized to the chest wall, reproducible with palpation, occurs with movement. Likely musculoskeletal in nature. Troponin negative x3. Patient with T wave inversions in inferior leads, but these were present in a prior EKG from >1 year ago. Ischemia unlikely at this time. Today she reports no chest pain. - Repeat EKG on 11/27/12 shows no dynamic changes from previous EKG.  #Vomiting and diarrhea - Some nausea in AM. - Prochlorperazine prn nausea  - Continue to monitor and treat as needed   #Anxiety - Patient with somewhat anxious affect. Less so during the day today. - Continuing home clonazepam and temazepam. Only required one dose of Klonopin last night.  - Will clarify her regimen and any recent changes that could have precipitated symptoms   #DVT PPX - SCDs  Dispo: Patient in good condition. For discharge home today.  The patient does have  a current PCP Aura Dials, MD) and does need an [facility] hospital follow-up appointment after discharge.  The patient does not have transportation limitations that hinder transportation to clinic appointments.  .Services Needed at time of discharge: Y = Yes, Blank = No PT: Y  OT: Y  RN: N  Equipment: N  Other: N    LOS: 3 days   Lars Masson, MD 11/29/2012, 10:27 AM Pager: [phone]

## 2012-11-29 NOTE — Care Management Note (Signed)
    Page 1 of 1   11/29/2012     2:48:23 PM   CARE MANAGEMENT NOTE 11/29/2012  Patient:  [name], [name]   Account Number:  [id number]  Date Initiated:  11/29/2012  Documentation initiated by:  GRAVES-BIGELOW,Shalonda Sachse  Subjective/Objective Assessment:   Pt admitted for Numbness and tingling on her left side, but it is improving. Plan for d/c home today.     Action/Plan:   CM did speak to pt and she chose [facility] for services. CM did make referral for HHPT services. SOC to begin within 24-48 hours post dc.   Anticipated DC Date:  11/29/2012   Anticipated DC Plan:  HOME W HOME HEALTH SERVICES      DC Planning Services  CM consult      Choice offered to / List presented to:          [facility] arranged  HH-2 PT      [facility] agency  [facility]   Status of service:  Completed, signed off Medicare Important Message given?   (If response is "NO", the following Medicare IM given date fields will be blank) Date Medicare IM given:   Date Additional Medicare IM given:    Discharge Disposition:  HOME W HOME HEALTH SERVICES  Per UR Regulation:  Reviewed for med. necessity/level of care/duration of stay  If discussed at Long Length of Stay Meetings, dates discussed:    Comments:

## 2012-12-03 DIAGNOSIS — R209 Unspecified disturbances of skin sensation: Secondary | ICD-10-CM

## 2012-12-03 HISTORY — DX: Unspecified disturbances of skin sensation: R20.9

## 2012-12-19 DIAGNOSIS — M1710 Unilateral primary osteoarthritis, unspecified knee: Secondary | ICD-10-CM | POA: Insufficient documentation

## 2012-12-29 ENCOUNTER — Ambulatory Visit (INDEPENDENT_AMBULATORY_CARE_PROVIDER_SITE_OTHER): Payer: Medicare Other | Admitting: Neurology

## 2012-12-29 ENCOUNTER — Encounter: Payer: Self-pay | Admitting: Neurology

## 2012-12-29 VITALS — BP 128/85 | HR 78 | Temp 98.1°F | Ht 61.25 in | Wt 185.0 lb

## 2012-12-29 DIAGNOSIS — R200 Anesthesia of skin: Secondary | ICD-10-CM

## 2012-12-29 DIAGNOSIS — R209 Unspecified disturbances of skin sensation: Secondary | ICD-10-CM

## 2012-12-29 MED ORDER — TOPIRAMATE 50 MG PO TABS: 50.0000 mg | ORAL_TABLET | Freq: Two times a day (BID) | ORAL | Status: DC

## 2012-12-29 NOTE — Patient Instructions (Signed)
Start Topamax 50 mg in the morning for one week and increase if tolerated to twice daily for numbness. Continued Depakote ER 500 mg at bedtime. Check repeat MRI scan of the brain with and without contrast as well as MRI scan of the neck. Return for followup in 3 months with Larita Fife, NP or. call earlier if necessary.

## 2012-12-29 NOTE — Progress Notes (Signed)
[facility] [address] [place]. [place] [address] [phone]       OFFICE FOLLOW-UP NOTE  Sheri. [name] Date of Birth:  1966/02/07 Medical Record Number:  [id number]   HPI: [name] is a 29 year  African american lady  Seen for f/u after [facility] admission on 11/26/12  With left sided tingling, numbness and left face droop and blurred vision which improved quickly. She subsequently complained of headache. MRI was negative for stroke and showed only mild small vessel ischemic changes. MRA brain showed no large vessel stenosis or occlusion.Marland Kitchen She was felt to have complicated migraine and started on depakote er and states hedache is better and she takes it at night and headache returns by 5 pm and goes away after she takes it. 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  ROS:   14 system review of systems is positive for weight gain,chest pain,vertigo,itching,blurred vision,shortness of breath,snoring,diarrhoea,constipation,easy bruising,feeling hot,feeling cold,increased thirst,joint pain,swelling,insomnia,headache,numbness,anxiety,aching muscles  PMH:  Past Medical History  Diagnosis Date  . Tendonitis   . Obesity   . Hypertension     Social History:  History   Social History  . Marital Status: Married    Spouse Name: antonio    Number of Children: 6  . Years of Education: college   Occupational History  . N/A    Social History Main Topics  . Smoking status: Passive Smoke Exposure - Never Smoker  . Smokeless tobacco: Never Used  . Alcohol Use: No  . Drug Use: No  . Sexual Activity: Yes    Partners: Male   Other Topics Concern  . Not on file   Social History Narrative  . No narrative on file    Medications:   Current Outpatient Prescriptions on File Prior to Visit  Medication Sig Dispense Refill  . albuterol (PROVENTIL) (5 MG/ML) 0.5%  nebulizer solution Take 5 mg by nebulization 2 (two) times daily.       Marland Kitchen azelastine (ASTELIN) 137 MCG/SPRAY nasal spray Place 1 spray into the nose 2 (two) times daily. Use in each nostril as directed      . budesonide-formoterol (SYMBICORT) 160-4.5 MCG/ACT inhaler Inhale 2 puffs into the lungs 4 (four) times daily.      . clonazePAM (KLONOPIN) 1 MG tablet Take 1 mg by mouth at bedtime.      . divalproex (DEPAKOTE ER) 500 MG 24 hr tablet Take 1 tablet (500 mg total) by mouth daily.  30 tablet  2  . EPINEPHrine (EPI-PEN) 0.3 mg/0.3 mL DEVI Inject 0.3 mg into the muscle daily as needed. For severe allergic reaction      . fluticasone (FLONASE) 50 MCG/ACT nasal spray Place 2 sprays into the nose daily.      . hydrOXYzine (ATARAX/VISTARIL) 25 MG tablet Take 25 mg by mouth at bedtime.      . Multiple Vitamins-Minerals (MULTIVITAMIN WITH MINERALS) tablet Take 1 tablet by mouth daily.      . polycarbophil (FIBERCON) 625 MG tablet Take 625 mg by mouth daily.      . ranitidine (ZANTAC) 150 MG tablet Take 150 mg by mouth daily.      . temazepam (RESTORIL) 7.5 MG capsule Take 7.5 mg by mouth at bedtime as needed. For insomnia       No current facility-administered medications on file prior to visit.    Allergies:  Allergies  Allergen Reactions  . Banana Shortness Of Breath and Swelling  . Latex Shortness Of Breath  . Onion Shortness Of Breath and Swelling  . Codeine Swelling  . Hydrocodone Hives and Swelling  . Penicillins Swelling    Physical Exam General: well developed, well nourished, seated, in no evident distress Head: head normocephalic and atraumatic. Orohparynx benign Neck: supple with no carotid or supraclavicular bruits Cardiovascular: regular rate and rhythm, no murmurs Musculoskeletal: no deformity Skin:  no rash/petichiae Vascular:  Normal pulses all extremities Filed Vitals:   12/29/12 1339  BP: 128/85  Pulse: 78  Temp: 98.1 F (36.7 C)    Neurologic Exam Mental  Status: Awake and fully alert. Oriented to place and time. Recent and remote memory intact. Attention span, concentration and fund of knowledge appropriate. Mood and affect appropriate.  Cranial Nerves: Fundoscopic exam reveals sharp disc margins. Pupils equal, briskly reactive to light. Extraocular movements full without nystagmus. Visual fields f show poor effort and possible left peripheral field defect to confrontation. Hearing intact. Facial sensation intact. Face, tongue, palate moves normally and symmetrically.  Motor: Normal bulk and tone. Normal strength in all tested extremity muscles but poor and variable effort with difficulty standing on heels and toes. Sensory.: subjective diminished  touch and pinprick sensation left hemibody including forehead vibratory.  Coordination: Rapid alternating movements normal in all extremities. Finger-to-nose and heel-to-shin performed accurately bilaterally. Gait and Station: Arises from chair without difficulty. Stance is normal. Gait demonstrates normal stride length and balance but favors right knee due to pain . Able to heel, toe and tandem walk without difficulty.  Reflexes: 1+ and symmetric. Toes downgoing.       ASSESSMENT: 53 year lady with recurrent headaches and left sided paresthesias likely complicated migraine versus nonorganic etiology with underlying anxeity/depression.    PLAN: Start Topamax 50 mg in the morning for one week and increase if tolerated to twice daily for numbness. Continued Depakote ER 500 mg at bedtime. Check repeat MRI scan of the brain with and without contrast as well as MRI scan of the neck. Return for followup in 3 months with Larita Fife, NP or. call earlier if necessary.

## 2013-01-06 ENCOUNTER — Ambulatory Visit
Admission: RE | Admit: 2013-01-06 | Discharge: 2013-01-06 | Disposition: A | Discharge status: Home / Self Care | Payer: Medicare Other | Source: Ambulatory Visit | Attending: Neurology | Admitting: Neurology

## 2013-01-06 DIAGNOSIS — R200 Anesthesia of skin: Secondary | ICD-10-CM

## 2013-01-06 DIAGNOSIS — R209 Unspecified disturbances of skin sensation: Secondary | ICD-10-CM

## 2013-01-06 MED ORDER — GADOBENATE DIMEGLUMINE 529 MG/ML IV SOLN
17.0000 mL | Freq: Once | INTRAVENOUS | Status: AC | PRN
  Administered 2013-01-06: 17 mL via INTRAVENOUS

## 2013-01-16 NOTE — Progress Notes (Signed)
Quick Note:  I called and relayed the MRI results showed no changes from last scan done 11-27-12. Pt verbalized understanding. ______

## 2013-01-20 ENCOUNTER — Encounter (HOSPITAL_COMMUNITY): Payer: Self-pay | Admitting: Pharmacy Technician

## 2013-01-25 ENCOUNTER — Encounter (HOSPITAL_COMMUNITY): Payer: Self-pay

## 2013-01-25 ENCOUNTER — Encounter (HOSPITAL_COMMUNITY)
Admission: RE | Admit: 2013-01-25 | Discharge: 2013-01-25 | Disposition: A | Discharge status: Home / Self Care | Payer: Medicare Other | Source: Ambulatory Visit | Attending: Orthopedic Surgery | Admitting: Orthopedic Surgery

## 2013-01-25 DIAGNOSIS — Z01812 Encounter for preprocedural laboratory examination: Secondary | ICD-10-CM | POA: Insufficient documentation

## 2013-01-25 HISTORY — DX: Gastro-esophageal reflux disease without esophagitis: K21.9

## 2013-01-25 HISTORY — DX: Allergy to other foods: Z91.018

## 2013-01-25 HISTORY — DX: Ocular hypertension, unspecified eye: H40.059

## 2013-01-25 HISTORY — DX: Unspecified asthma, uncomplicated: J45.909

## 2013-01-25 HISTORY — DX: Headache: R51

## 2013-01-25 HISTORY — DX: Unspecified osteoarthritis, unspecified site: M19.90

## 2013-01-25 LAB — URINALYSIS, ROUTINE W REFLEX MICROSCOPIC
Glucose, UA: NEGATIVE mg/dL
Ketones, ur: NEGATIVE mg/dL
Leukocytes, UA: NEGATIVE
Nitrite: NEGATIVE
Specific Gravity, Urine: 1.029 (ref 1.005–1.030)
Urobilinogen, UA: 0.2 mg/dL (ref 0.0–1.0)
pH: 5.5 (ref 5.0–8.0)

## 2013-01-25 LAB — CBC
HCT: 42.5 % (ref 36.0–46.0)
Hemoglobin: 14.1 g/dL (ref 12.0–15.0)
MCH: 30.7 pg (ref 26.0–34.0)
MCHC: 33.2 g/dL (ref 30.0–36.0)
RDW: 13.9 % (ref 11.5–15.5)

## 2013-01-25 LAB — PROTIME-INR
INR: 0.94 (ref 0.00–1.49)
Prothrombin Time: 12.4 seconds (ref 11.6–15.2)

## 2013-01-25 LAB — BASIC METABOLIC PANEL
BUN: 9 mg/dL (ref 6–23)
Chloride: 104 mEq/L (ref 96–112)
GFR calc non Af Amer: >90 mL/min (ref >90)
Glucose, Bld: 112 mg/dL — ABNORMAL HIGH (ref 70–99)
Potassium: 5 mEq/L (ref 3.5–5.1)

## 2013-01-25 LAB — SURGICAL PCR SCREEN
MRSA, PCR: NEGATIVE
Staphylococcus aureus: NEGATIVE

## 2013-01-25 NOTE — Patient Instructions (Addendum)
20 Juaquina Machnik  01/25/2013   Your procedure is scheduled on:10-28   -2014  Report to [facility] at    1230     PM.  Call this number if you have problems the morning of surgery: [phone]  Or Presurgical Testing [phone](Morgann Woodburn)      Do not eat food:After Midnight.  May have clear liquids:up to 6 Hours before arrival. Nothing after : 0900 am  Clear liquids include soda, tea, black coffee, apple or grape juice, broth.  Take these medicines the morning of surgery with A SIP OF WATER:Clonazepam. Zyrtec. Pristiq. Use Nebulizer as needed. Use/bring Symbicort/ ProAir inhalers. Use Flonase/Astelin-bring. Bring Epi-Pen.    Do not wear jewelry, make-up or nail polish.  Do not wear lotions, powders, or perfumes. You may wear deodorant.  Do not shave 12 hours prior to first CHG shower(legs and under arms).(face and neck okay.)  Do not bring valuables to the hospital.  Contacts, dentures or bridgework,body piercing,  may not be worn into surgery.  Leave suitcase in the car. After surgery it may be brought to your room.  For patients admitted to the hospital, checkout time is 11:00 AM the day of discharge.   Patients discharged the day of surgery will not be allowed to drive home. Must have responsible person with you x 24 hours once discharged.  Name and phone number of your driver: Antonio-spouse [phone] cell  Special Instructions: CHG(Chlorhedine 4%-"Hibiclens","Betasept","Aplicare") Shower Use Special Wash: see special instructions.(avoid face and genitals)   Please read over the following fact sheets that you were given: MRSA Information, Blood Transfusion fact sheet, Incentive Spirometry Instruction.    Failure to follow these instructions may result in Cancellation of your surgery.   Patient signature_______________________________________________________

## 2013-01-25 NOTE — Pre-Procedure Instructions (Addendum)
01-25-13 EKG/ CXR 8'14 Epic

## 2013-01-29 NOTE — H&P (Signed)
TOTAL KNEE ADMISSION H&P  Patient is being admitted for right total knee arthroplasty.  Subjective:  Chief Complaint:   Right knee OA / pain.  HPI: [name], 47 y.o. female, has a history of pain and functional disability in the right knee due to arthritis and has failed non-surgical conservative treatments for greater than 12 weeks to includeNSAID's and/or analgesics, corticosteriod injections and activity modification.  Onset of symptoms was gradual, starting 3+ years ago with gradually worsening course since that time. The patient noted prior procedures on the knee to include  arthroscopy on the right knee(s).  Patient currently rates pain in the right knee(s) at 10 out of 10 with activity. Patient has night pain, worsening of pain with activity and weight bearing, pain that interferes with activities of daily living, pain with passive range of motion, crepitus and joint swelling.  Patient has evidence of periarticular osteophytes and joint space narrowing by imaging studies. There is no active infection.  Risks, benefits and expectations were discussed with the patient. Patient understand the risks, benefits and expectations and wishes to proceed with surgery.   D/C Plans:   SNF  Post-op Meds:    No Rx given   Tranexamic Acid:   Not to be given - ? stroke symptoms  Decadron:    To be given  FYI:    ASA post-op   Patient Active Problem List   Diagnosis Date Noted  . Headache(784.0) 11/28/2012  . Stroke-like symptoms 11/26/2012  . CHEST PAIN UNSPECIFIED 03/06/2009  . DEPRESSION 03/05/2009  . HYPERTENSION 03/05/2009  . FATTY LIVER DISEASE 03/05/2009   Past Medical History  Diagnosis Date  . Tendonitis   . Obesity   . Hypertension     no problems since weight loss -'09  . Asthma   . Multiple food allergies     Latex allergy-respiratory  . GERD (gastroesophageal reflux disease)   . Headache(784.0)     occ. with allergies/sinus issues  . Arthritis     osteoarthritis-knees   . Disease of eye characterized by increased eye pressure     no eye drops used-some increased pressure    Past Surgical History  Procedure Laterality Date  . Cesarean section      x4  . Abdominal hysterectomy    . Bowel resection    . Knee surgery      Right knee scope 8'12    No prescriptions prior to admission   Allergies  Allergen Reactions  . Banana Shortness Of Breath and Swelling  . Latex Shortness Of Breath  . Onion Shortness Of Breath and Swelling  . Codeine Swelling  . Hydrocodone Hives and Swelling  . Penicillins Swelling    History  Substance Use Topics  . Smoking status: Former Smoker    Quit date: 01/25/2006  . Smokeless tobacco: Never Used  . Alcohol Use: No    Family History  Problem Relation Age of Onset  . Lung cancer Mother   . Diabetes Father      Review of Systems  Constitutional: Negative.   Eyes: Negative.   Respiratory: Negative.   Cardiovascular: Negative.   Gastrointestinal: Positive for heartburn.  Genitourinary: Negative.   Musculoskeletal: Positive for joint pain.  Skin: Negative.   Neurological: Positive for headaches.  Endo/Heme/Allergies: Negative.   Psychiatric/Behavioral: Negative.     Objective:  Physical Exam  Constitutional: She is oriented to person, place, and time. She appears well-developed and well-nourished.  HENT:  Head: Normocephalic and atraumatic.  Mouth/Throat: Oropharynx  is clear and moist.  Eyes: Pupils are equal, round, and reactive to light.  Neck: Neck supple. No JVD present. No tracheal deviation present. No thyromegaly present.  Cardiovascular: Normal rate, regular rhythm, normal heart sounds and intact distal pulses.   Respiratory: Effort normal and breath sounds normal. No stridor. No respiratory distress. She has no wheezes.  GI: Soft. There is no tenderness. There is no guarding.  Musculoskeletal:       Right knee: She exhibits decreased range of motion, swelling, effusion and bony tenderness.  She exhibits no ecchymosis, no deformity, no laceration and no erythema. Tenderness found.  Lymphadenopathy:    She has no cervical adenopathy.  Neurological: She is alert and oriented to person, place, and time.  Skin: Skin is warm and dry.  Psychiatric: She has a normal mood and affect.     Labs:  Estimated body mass index is 34.66 kg/(m^2) as calculated from the following:   Height as of 12/29/12: 5' 1.25" (1.556 m).   Weight as of 12/29/12: 83.915 kg (185 lb).   Imaging Review Plain radiographs demonstrate severe degenerative joint disease of the right knee(s). The overall alignment isneutral. The bone quality appears to be good for age and reported activity level.  Assessment/Plan:  End stage arthritis, right knee   The patient history, physical examination, clinical judgment of the provider and imaging studies are consistent with end stage degenerative joint disease of the right knee(s) and total knee arthroplasty is deemed medically necessary. The treatment options including medical management, injection therapy arthroscopy and arthroplasty were discussed at length. The risks and benefits of total knee arthroplasty were presented and reviewed. The risks due to aseptic loosening, infection, stiffness, patella tracking problems, thromboembolic complications and other imponderables were discussed. The patient acknowledged the explanation, agreed to proceed with the plan and consent was signed. Patient is being admitted for inpatient treatment for surgery, pain control, PT, OT, prophylactic antibiotics, VTE prophylaxis, progressive ambulation and ADL's and discharge planning. The patient is planning to be discharged to skilled nursing facility.     Anastasio Auerbach Shizue Kaseman   PAC  01/29/2013, 9:52 PM

## 2013-01-31 ENCOUNTER — Encounter (HOSPITAL_COMMUNITY): Payer: Medicare Other | Admitting: Certified Registered"

## 2013-01-31 ENCOUNTER — Encounter (HOSPITAL_COMMUNITY): Payer: Self-pay | Admitting: *Deleted

## 2013-01-31 ENCOUNTER — Inpatient Hospital Stay (HOSPITAL_COMMUNITY)
Admission: RE | Admit: 2013-01-31 | Discharge: 2013-02-03 | DRG: 470 | Disposition: A | Discharge status: Skilled Nursing Facility | Payer: Medicare Other | Source: Ambulatory Visit | Attending: Orthopedic Surgery | Admitting: Orthopedic Surgery

## 2013-01-31 ENCOUNTER — Inpatient Hospital Stay (HOSPITAL_COMMUNITY): Payer: Medicare Other | Admitting: Certified Registered"

## 2013-01-31 ENCOUNTER — Encounter (HOSPITAL_COMMUNITY)
Admission: RE | Disposition: A | Discharge status: Skilled Nursing Facility | Payer: Self-pay | Source: Ambulatory Visit | Attending: Orthopedic Surgery

## 2013-01-31 DIAGNOSIS — Z96652 Presence of left artificial knee joint: Secondary | ICD-10-CM

## 2013-01-31 DIAGNOSIS — K7689 Other specified diseases of liver: Secondary | ICD-10-CM | POA: Diagnosis present

## 2013-01-31 DIAGNOSIS — F3289 Other specified depressive episodes: Secondary | ICD-10-CM | POA: Diagnosis present

## 2013-01-31 DIAGNOSIS — Z91040 Latex allergy status: Secondary | ICD-10-CM

## 2013-01-31 DIAGNOSIS — Z96659 Presence of unspecified artificial knee joint: Secondary | ICD-10-CM

## 2013-01-31 DIAGNOSIS — J45909 Unspecified asthma, uncomplicated: Secondary | ICD-10-CM | POA: Diagnosis present

## 2013-01-31 DIAGNOSIS — Z880 Allergy status to penicillin: Secondary | ICD-10-CM

## 2013-01-31 DIAGNOSIS — E669 Obesity, unspecified: Secondary | ICD-10-CM | POA: Diagnosis present

## 2013-01-31 DIAGNOSIS — K219 Gastro-esophageal reflux disease without esophagitis: Secondary | ICD-10-CM | POA: Diagnosis present

## 2013-01-31 DIAGNOSIS — Z96651 Presence of right artificial knee joint: Secondary | ICD-10-CM

## 2013-01-31 DIAGNOSIS — Z6836 Body mass index (BMI) 36.0-36.9, adult: Secondary | ICD-10-CM

## 2013-01-31 DIAGNOSIS — D62 Acute posthemorrhagic anemia: Secondary | ICD-10-CM | POA: Diagnosis not present

## 2013-01-31 DIAGNOSIS — I10 Essential (primary) hypertension: Secondary | ICD-10-CM | POA: Diagnosis present

## 2013-01-31 DIAGNOSIS — M1710 Unilateral primary osteoarthritis, unspecified knee: Principal | ICD-10-CM | POA: Diagnosis present

## 2013-01-31 DIAGNOSIS — F329 Major depressive disorder, single episode, unspecified: Secondary | ICD-10-CM | POA: Diagnosis present

## 2013-01-31 HISTORY — PX: TOTAL KNEE ARTHROPLASTY: SHX125

## 2013-01-31 HISTORY — DX: Presence of left artificial knee joint: Z96.652

## 2013-01-31 LAB — TYPE AND SCREEN: Antibody Screen: NEGATIVE

## 2013-01-31 SURGERY — ARTHROPLASTY, KNEE, TOTAL
Anesthesia: General | Site: Knee | Laterality: Right | Wound class: Clean

## 2013-01-31 MED ORDER — ONDANSETRON HCL 4 MG/2ML IJ SOLN: 4.0000 mg | Freq: Four times a day (QID) | INTRAMUSCULAR | Status: DC | PRN

## 2013-01-31 MED ORDER — METHOCARBAMOL 500 MG PO TABS
500.0000 mg | ORAL_TABLET | Freq: Four times a day (QID) | ORAL | Status: DC | PRN
  Administered 2013-02-01 – 2013-02-03 (×7): 500 mg via ORAL
  Filled 2013-01-31 (×7): qty 1

## 2013-01-31 MED ORDER — KETOROLAC TROMETHAMINE 30 MG/ML IJ SOLN
INTRAMUSCULAR | Status: AC
  Filled 2013-01-31: qty 1

## 2013-01-31 MED ORDER — CLINDAMYCIN PHOSPHATE 600 MG/50ML IV SOLN
600.0000 mg | Freq: Four times a day (QID) | INTRAVENOUS | Status: AC
  Administered 2013-01-31 – 2013-02-01 (×2): 600 mg via INTRAVENOUS
  Filled 2013-01-31 (×2): qty 50

## 2013-01-31 MED ORDER — AZELASTINE HCL 0.1 % NA SOLN
2.0000 | Freq: Every day | NASAL | Status: DC
  Administered 2013-01-31 – 2013-02-02 (×3): 2 via NASAL
  Filled 2013-01-31: qty 30

## 2013-01-31 MED ORDER — HYDROMORPHONE HCL PF 1 MG/ML IJ SOLN
INTRAMUSCULAR | Status: AC
  Filled 2013-01-31: qty 1

## 2013-01-31 MED ORDER — ESMOLOL HCL 10 MG/ML IV SOLN
INTRAVENOUS | Status: DC | PRN
  Administered 2013-01-31: 15 mg via INTRAVENOUS

## 2013-01-31 MED ORDER — TEMAZEPAM 15 MG PO CAPS: 15.0000 mg | ORAL_CAPSULE | Freq: Every evening | ORAL | Status: DC | PRN

## 2013-01-31 MED ORDER — CELECOXIB 200 MG PO CAPS
200.0000 mg | ORAL_CAPSULE | Freq: Two times a day (BID) | ORAL | Status: DC
  Administered 2013-01-31 – 2013-02-03 (×6): 200 mg via ORAL
  Filled 2013-01-31 (×7): qty 1

## 2013-01-31 MED ORDER — ONDANSETRON HCL 4 MG PO TABS: 4.0000 mg | ORAL_TABLET | Freq: Four times a day (QID) | ORAL | Status: DC | PRN

## 2013-01-31 MED ORDER — CHLORHEXIDINE GLUCONATE 4 % EX LIQD: 60.0000 mL | Freq: Once | CUTANEOUS | Status: DC

## 2013-01-31 MED ORDER — SUCCINYLCHOLINE CHLORIDE 20 MG/ML IJ SOLN
INTRAMUSCULAR | Status: DC | PRN
  Administered 2013-01-31: 100 mg via INTRAVENOUS

## 2013-01-31 MED ORDER — SODIUM CHLORIDE 0.9 % IR SOLN
Status: DC | PRN
  Administered 2013-01-31: 400 mL

## 2013-01-31 MED ORDER — HYDROMORPHONE HCL PF 1 MG/ML IJ SOLN
INTRAMUSCULAR | Status: DC | PRN
  Administered 2013-01-31 (×2): 1 mg via INTRAVENOUS

## 2013-01-31 MED ORDER — BUPIVACAINE-EPINEPHRINE PF 0.25-1:200000 % IJ SOLN
INTRAMUSCULAR | Status: AC
  Filled 2013-01-31: qty 30

## 2013-01-31 MED ORDER — HYDROMORPHONE HCL 2 MG PO TABS
2.0000 mg | ORAL_TABLET | ORAL | Status: DC
  Administered 2013-01-31: 2 mg via ORAL
  Administered 2013-02-01 (×6): 4 mg via ORAL
  Administered 2013-02-02: 2 mg via ORAL
  Administered 2013-02-02 – 2013-02-03 (×8): 4 mg via ORAL
  Filled 2013-01-31: qty 2
  Filled 2013-01-31: qty 1
  Filled 2013-01-31 (×2): qty 2
  Filled 2013-01-31: qty 1
  Filled 2013-01-31 (×11): qty 2

## 2013-01-31 MED ORDER — 0.9 % SODIUM CHLORIDE (POUR BTL) OPTIME
TOPICAL | Status: DC | PRN
  Administered 2013-01-31: 1000 mL

## 2013-01-31 MED ORDER — HYDROMORPHONE HCL PF 1 MG/ML IJ SOLN
0.2500 mg | INTRAMUSCULAR | Status: DC | PRN
  Administered 2013-01-31 (×4): 0.5 mg via INTRAVENOUS

## 2013-01-31 MED ORDER — BISACODYL 10 MG RE SUPP: 10.0000 mg | Freq: Every day | RECTAL | Status: DC | PRN

## 2013-01-31 MED ORDER — ONDANSETRON HCL 4 MG/2ML IJ SOLN
INTRAMUSCULAR | Status: DC | PRN
  Administered 2013-01-31: 4 mg via INTRAVENOUS

## 2013-01-31 MED ORDER — CLONAZEPAM 1 MG PO TABS
1.0000 mg | ORAL_TABLET | Freq: Four times a day (QID) | ORAL | Status: DC
  Administered 2013-01-31 – 2013-02-03 (×10): 1 mg via ORAL
  Filled 2013-01-31 (×10): qty 1

## 2013-01-31 MED ORDER — FAMOTIDINE 20 MG PO TABS
20.0000 mg | ORAL_TABLET | Freq: Every day | ORAL | Status: DC
  Administered 2013-01-31 – 2013-02-03 (×4): 20 mg via ORAL
  Filled 2013-01-31 (×6): qty 1

## 2013-01-31 MED ORDER — HYDRALAZINE HCL 20 MG/ML IJ SOLN
INTRAMUSCULAR | Status: DC | PRN
  Administered 2013-01-31: 4 mg via INTRAVENOUS

## 2013-01-31 MED ORDER — DEXAMETHASONE SODIUM PHOSPHATE 10 MG/ML IJ SOLN
INTRAMUSCULAR | Status: DC | PRN
  Administered 2013-01-31: 10 mg via INTRAVENOUS

## 2013-01-31 MED ORDER — FENTANYL CITRATE 0.05 MG/ML IJ SOLN
INTRAMUSCULAR | Status: DC | PRN
  Administered 2013-01-31: 50 ug via INTRAVENOUS
  Administered 2013-01-31: 100 ug via INTRAVENOUS
  Administered 2013-01-31: 50 ug via INTRAVENOUS

## 2013-01-31 MED ORDER — PHENOL 1.4 % MT LIQD: 1.0000 | OROMUCOSAL | Status: DC | PRN

## 2013-01-31 MED ORDER — DEXAMETHASONE SODIUM PHOSPHATE 10 MG/ML IJ SOLN
10.0000 mg | Freq: Once | INTRAMUSCULAR | Status: AC
  Administered 2013-02-01: 10 mg via INTRAVENOUS
  Filled 2013-01-31: qty 1

## 2013-01-31 MED ORDER — LIDOCAINE HCL (PF) 2 % IJ SOLN
INTRAMUSCULAR | Status: DC | PRN
  Administered 2013-01-31: 40 mg

## 2013-01-31 MED ORDER — SODIUM CHLORIDE 0.9 % IV SOLN
INTRAVENOUS | Status: DC
  Administered 2013-01-31 – 2013-02-01 (×2): via INTRAVENOUS
  Filled 2013-01-31 (×8): qty 1000

## 2013-01-31 MED ORDER — BUPIVACAINE LIPOSOME 1.3 % IJ SUSP
20.0000 mL | Freq: Once | INTRAMUSCULAR | Status: AC
  Administered 2013-01-31: 20 mL
  Filled 2013-01-31: qty 20

## 2013-01-31 MED ORDER — FLUTICASONE PROPIONATE 50 MCG/ACT NA SUSP
2.0000 | Freq: Every day | NASAL | Status: DC
  Administered 2013-02-01 – 2013-02-03 (×3): 2 via NASAL
  Filled 2013-01-31: qty 16

## 2013-01-31 MED ORDER — MONTELUKAST SODIUM 10 MG PO TABS
10.0000 mg | ORAL_TABLET | Freq: Every day | ORAL | Status: DC
  Administered 2013-01-31 – 2013-02-02 (×3): 10 mg via ORAL
  Filled 2013-01-31 (×4): qty 1

## 2013-01-31 MED ORDER — SODIUM CHLORIDE 0.9 % IJ SOLN
INTRAMUSCULAR | Status: DC | PRN
  Administered 2013-01-31: 14 mL via INTRAVENOUS

## 2013-01-31 MED ORDER — DIPHENHYDRAMINE HCL 25 MG PO CAPS: 25.0000 mg | ORAL_CAPSULE | Freq: Four times a day (QID) | ORAL | Status: DC | PRN

## 2013-01-31 MED ORDER — MIDAZOLAM HCL 5 MG/5ML IJ SOLN
INTRAMUSCULAR | Status: DC | PRN
  Administered 2013-01-31: 2 mg via INTRAVENOUS

## 2013-01-31 MED ORDER — CLINDAMYCIN PHOSPHATE 900 MG/50ML IV SOLN
INTRAVENOUS | Status: AC
  Filled 2013-01-31: qty 50

## 2013-01-31 MED ORDER — MENTHOL 3 MG MT LOZG: 1.0000 | LOZENGE | OROMUCOSAL | Status: DC | PRN

## 2013-01-31 MED ORDER — HYDROMORPHONE HCL PF 1 MG/ML IJ SOLN: 0.5000 mg | INTRAMUSCULAR | Status: DC | PRN

## 2013-01-31 MED ORDER — KETOROLAC TROMETHAMINE 30 MG/ML IJ SOLN
INTRAMUSCULAR | Status: DC | PRN
  Administered 2013-01-31: 30 mg via INTRAVENOUS

## 2013-01-31 MED ORDER — PROPOFOL 10 MG/ML IV BOLUS
INTRAVENOUS | Status: DC | PRN
  Administered 2013-01-31: 150 mg via INTRAVENOUS

## 2013-01-31 MED ORDER — ROCURONIUM BROMIDE 100 MG/10ML IV SOLN
INTRAVENOUS | Status: DC | PRN
  Administered 2013-01-31: 20 mg via INTRAVENOUS

## 2013-01-31 MED ORDER — NEOSTIGMINE METHYLSULFATE 1 MG/ML IJ SOLN
INTRAMUSCULAR | Status: DC | PRN
  Administered 2013-01-31: 3 mg via INTRAVENOUS

## 2013-01-31 MED ORDER — METHOCARBAMOL 100 MG/ML IJ SOLN
500.0000 mg | Freq: Four times a day (QID) | INTRAVENOUS | Status: DC | PRN
  Filled 2013-01-31: qty 5

## 2013-01-31 MED ORDER — SODIUM CHLORIDE 0.9 % IJ SOLN
INTRAMUSCULAR | Status: AC
  Filled 2013-01-31: qty 50

## 2013-01-31 MED ORDER — ASPIRIN EC 325 MG PO TBEC
325.0000 mg | DELAYED_RELEASE_TABLET | Freq: Two times a day (BID) | ORAL | Status: DC
  Administered 2013-02-01 – 2013-02-03 (×5): 325 mg via ORAL
  Filled 2013-01-31 (×8): qty 1

## 2013-01-31 MED ORDER — GLYCOPYRROLATE 0.2 MG/ML IJ SOLN
INTRAMUSCULAR | Status: DC | PRN
  Administered 2013-01-31: 0.4 mg via INTRAVENOUS

## 2013-01-31 MED ORDER — FLEET ENEMA 7-19 GM/118ML RE ENEM: 1.0000 | ENEMA | Freq: Once | RECTAL | Status: AC | PRN

## 2013-01-31 MED ORDER — METOCLOPRAMIDE HCL 10 MG PO TABS: 5.0000 mg | ORAL_TABLET | Freq: Three times a day (TID) | ORAL | Status: DC | PRN

## 2013-01-31 MED ORDER — DOCUSATE SODIUM 100 MG PO CAPS
100.0000 mg | ORAL_CAPSULE | Freq: Two times a day (BID) | ORAL | Status: DC
  Administered 2013-01-31 – 2013-02-02 (×5): 100 mg via ORAL

## 2013-01-31 MED ORDER — LORATADINE 10 MG PO TABS
10.0000 mg | ORAL_TABLET | Freq: Every day | ORAL | Status: DC
  Administered 2013-01-31 – 2013-02-03 (×4): 10 mg via ORAL
  Filled 2013-01-31 (×4): qty 1

## 2013-01-31 MED ORDER — FERROUS SULFATE 325 (65 FE) MG PO TABS
325.0000 mg | ORAL_TABLET | Freq: Three times a day (TID) | ORAL | Status: DC
  Administered 2013-01-31 – 2013-02-03 (×8): 325 mg via ORAL
  Filled 2013-01-31 (×11): qty 1

## 2013-01-31 MED ORDER — ALBUTEROL SULFATE HFA 108 (90 BASE) MCG/ACT IN AERS
2.0000 | INHALATION_SPRAY | Freq: Four times a day (QID) | RESPIRATORY_TRACT | Status: DC | PRN
  Filled 2013-01-31: qty 6.7

## 2013-01-31 MED ORDER — DEXAMETHASONE SODIUM PHOSPHATE 10 MG/ML IJ SOLN: 10.0000 mg | Freq: Once | INTRAMUSCULAR | Status: DC

## 2013-01-31 MED ORDER — PROMETHAZINE HCL 25 MG/ML IJ SOLN: 6.2500 mg | INTRAMUSCULAR | Status: DC | PRN

## 2013-01-31 MED ORDER — LACTATED RINGERS IV SOLN
INTRAVENOUS | Status: AC
  Administered 2013-01-31: 16:00:00 via INTRAVENOUS
  Administered 2013-01-31: 1000 mL via INTRAVENOUS

## 2013-01-31 MED ORDER — HYDROXYZINE HCL 25 MG PO TABS
25.0000 mg | ORAL_TABLET | Freq: Every day | ORAL | Status: DC
  Administered 2013-01-31 – 2013-02-02 (×3): 25 mg via ORAL
  Filled 2013-01-31 (×4): qty 1

## 2013-01-31 MED ORDER — VENLAFAXINE HCL ER 75 MG PO CP24
75.0000 mg | ORAL_CAPSULE | Freq: Every day | ORAL | Status: DC
  Administered 2013-02-01 – 2013-02-03 (×3): 75 mg via ORAL
  Filled 2013-01-31 (×4): qty 1

## 2013-01-31 MED ORDER — ALBUTEROL SULFATE (5 MG/ML) 0.5% IN NEBU
5.0000 mg | INHALATION_SOLUTION | Freq: Two times a day (BID) | RESPIRATORY_TRACT | Status: DC
  Filled 2013-01-31 (×2): qty 1

## 2013-01-31 MED ORDER — CLINDAMYCIN PHOSPHATE 900 MG/50ML IV SOLN
900.0000 mg | INTRAVENOUS | Status: AC
  Administered 2013-01-31: 900 mg via INTRAVENOUS

## 2013-01-31 MED ORDER — METOCLOPRAMIDE HCL 5 MG/ML IJ SOLN: 5.0000 mg | Freq: Three times a day (TID) | INTRAMUSCULAR | Status: DC | PRN

## 2013-01-31 MED ORDER — BUDESONIDE-FORMOTEROL FUMARATE 160-4.5 MCG/ACT IN AERO
2.0000 | INHALATION_SPRAY | Freq: Two times a day (BID) | RESPIRATORY_TRACT | Status: DC
  Administered 2013-01-31 – 2013-02-03 (×6): 2 via RESPIRATORY_TRACT
  Filled 2013-01-31: qty 6

## 2013-01-31 MED ORDER — POLYETHYLENE GLYCOL 3350 17 G PO PACK
17.0000 g | PACK | Freq: Two times a day (BID) | ORAL | Status: DC
Start: 2013-01-31 — End: 2013-02-03
  Administered 2013-01-31 – 2013-02-02 (×5): 17 g via ORAL

## 2013-01-31 MED ORDER — ALUM & MAG HYDROXIDE-SIMETH 200-200-20 MG/5ML PO SUSP: 30.0000 mL | ORAL | Status: DC | PRN

## 2013-01-31 MED ORDER — TRIAMCINOLONE ACETONIDE 0.1 % EX CREA: 1.0000 | TOPICAL_CREAM | Freq: Every day | CUTANEOUS | Status: DC | PRN

## 2013-01-31 SURGICAL SUPPLY — 54 items
BAG ZIPLOCK 12X15 (MISCELLANEOUS) ×2 IMPLANT
BANDAGE ELASTIC 6 VELCRO ST LF (GAUZE/BANDAGES/DRESSINGS) ×2 IMPLANT
BANDAGE ESMARK 6X9 LF (GAUZE/BANDAGES/DRESSINGS) ×1 IMPLANT
BLADE SAW SGTL 13.0X1.19X90.0M (BLADE) ×2 IMPLANT
BNDG ESMARK 6X9 LF (GAUZE/BANDAGES/DRESSINGS) ×2
BOWL SMART MIX CTS (DISPOSABLE) ×2 IMPLANT
CAPT RP KNEE ×2 IMPLANT
CEMENT HV SMART SET (Cement) ×4 IMPLANT
CUFF TOURN SGL QUICK 34 (TOURNIQUET CUFF) ×1
CUFF TRNQT CYL 34X4X40X1 (TOURNIQUET CUFF) ×1 IMPLANT
DECANTER SPIKE VIAL GLASS SM (MISCELLANEOUS) ×2 IMPLANT
DERMABOND ADVANCED (GAUZE/BANDAGES/DRESSINGS) ×1
DERMABOND ADVANCED .7 DNX12 (GAUZE/BANDAGES/DRESSINGS) ×1 IMPLANT
DRAPE EXTREMITY T 121X128X90 (DRAPE) ×2 IMPLANT
DRAPE POUCH INSTRU U-SHP 10X18 (DRAPES) ×2 IMPLANT
DRAPE U-SHAPE 47X51 STRL (DRAPES) ×2 IMPLANT
DRSG AQUACEL AG ADV 3.5X10 (GAUZE/BANDAGES/DRESSINGS) ×2 IMPLANT
DRSG TEGADERM 4X4.75 (GAUZE/BANDAGES/DRESSINGS) ×2 IMPLANT
DURAPREP 26ML APPLICATOR (WOUND CARE) ×4 IMPLANT
ELECT REM PT RETURN 9FT ADLT (ELECTROSURGICAL) ×2
ELECTRODE REM PT RTRN 9FT ADLT (ELECTROSURGICAL) ×1 IMPLANT
EVACUATOR 1/8 PVC DRAIN (DRAIN) ×2 IMPLANT
FACESHIELD LNG OPTICON STERILE (SAFETY) ×10 IMPLANT
GAUZE SPONGE 2X2 8PLY STRL LF (GAUZE/BANDAGES/DRESSINGS) ×1 IMPLANT
GLOVE BIOGEL PI IND STRL 7.5 (GLOVE) ×1 IMPLANT
GLOVE BIOGEL PI IND STRL 8 (GLOVE) ×1 IMPLANT
GLOVE BIOGEL PI INDICATOR 7.5 (GLOVE) ×1
GLOVE BIOGEL PI INDICATOR 8 (GLOVE) ×1
GLOVE ECLIPSE 8.0 STRL XLNG CF (GLOVE) ×2 IMPLANT
GLOVE ORTHO TXT STRL SZ7.5 (GLOVE) ×4 IMPLANT
GOWN BRE IMP PREV XXLGXLNG (GOWN DISPOSABLE) ×2 IMPLANT
GOWN PREVENTION PLUS LG XLONG (DISPOSABLE) ×2 IMPLANT
HANDPIECE INTERPULSE COAX TIP (DISPOSABLE) ×1
KIT BASIN OR (CUSTOM PROCEDURE TRAY) ×2 IMPLANT
MANIFOLD NEPTUNE II (INSTRUMENTS) ×2 IMPLANT
NDL SAFETY ECLIPSE 18X1.5 (NEEDLE) ×1 IMPLANT
NEEDLE HYPO 18GX1.5 SHARP (NEEDLE) ×1
NS IRRIG 1000ML POUR BTL (IV SOLUTION) ×2 IMPLANT
PACK TOTAL JOINT (CUSTOM PROCEDURE TRAY) ×2 IMPLANT
POSITIONER SURGICAL ARM (MISCELLANEOUS) ×2 IMPLANT
SET HNDPC FAN SPRY TIP SCT (DISPOSABLE) ×1 IMPLANT
SET PAD KNEE POSITIONER (MISCELLANEOUS) ×2 IMPLANT
SPONGE GAUZE 2X2 STER 10/PKG (GAUZE/BANDAGES/DRESSINGS) ×1
SUCTION FRAZIER 12FR DISP (SUCTIONS) ×2 IMPLANT
SUT MNCRL AB 4-0 PS2 18 (SUTURE) ×2 IMPLANT
SUT VIC AB 1 CT1 36 (SUTURE) ×2 IMPLANT
SUT VIC AB 2-0 CT1 27 (SUTURE) ×3
SUT VIC AB 2-0 CT1 TAPERPNT 27 (SUTURE) ×3 IMPLANT
SUT VLOC 180 0 24IN GS25 (SUTURE) ×2 IMPLANT
SYR 50ML LL SCALE MARK (SYRINGE) ×2 IMPLANT
TOWEL OR 17X26 10 PK STRL BLUE (TOWEL DISPOSABLE) ×4 IMPLANT
TRAY FOLEY CATH 14FRSI W/METER (CATHETERS) ×2 IMPLANT
WATER STERILE IRR 1500ML POUR (IV SOLUTION) ×2 IMPLANT
WRAP KNEE MAXI GEL POST OP (GAUZE/BANDAGES/DRESSINGS) ×2 IMPLANT

## 2013-01-31 NOTE — Anesthesia Preprocedure Evaluation (Addendum)
Anesthesia Evaluation  Patient identified by MRN, date of birth, ID band Patient awake    Reviewed: Allergy & Precautions, H&P , NPO status , Patient's Chart, lab work & pertinent test results  Airway Mallampati: II TM Distance: >3 FB Neck ROM: Full    Dental  (+) Missing and Dental Advisory Given   Pulmonary asthma ,  breath sounds clear to auscultation  Pulmonary exam normal       Cardiovascular hypertension, Pt. on medications Rhythm:Regular Rate:Normal     Neuro/Psych negative neurological ROS  negative psych ROS   GI/Hepatic negative GI ROS, Neg liver ROS,   Endo/Other  negative endocrine ROS  Renal/GU negative Renal ROS  negative genitourinary   Musculoskeletal negative musculoskeletal ROS (+)   Abdominal   Peds negative pediatric ROS (+)  Hematology negative hematology ROS (+)   Anesthesia Other Findings   Reproductive/Obstetrics negative OB ROS                          Anesthesia Physical Anesthesia Plan  ASA: II  Anesthesia Plan: General   Post-op Pain Management:    Induction: Intravenous  Airway Management Planned: Oral ETT  Additional Equipment:   Intra-op Plan:   Post-operative Plan: Extubation in OR  Informed Consent: I have reviewed the patients History and Physical, chart, labs and discussed the procedure including the risks, benefits and alternatives for the proposed anesthesia with the patient or authorized representative who has indicated his/her understanding and acceptance.   Dental advisory given  Plan Discussed with: CRNA and Surgeon  Anesthesia Plan Comments:        Anesthesia Quick Evaluation

## 2013-01-31 NOTE — Op Note (Signed)
NAME:  [name]                      MEDICAL RECORD NO.:  [id number]                             FACILITY:  [facility]      PHYSICIAN:  Madlyn Frankel. Charlann Boxer, M.D.  DATE OF BIRTH:  Jun 22, 1965      DATE OF PROCEDURE:  01/31/2013                                     OPERATIVE REPORT         PREOPERATIVE DIAGNOSIS:  Right knee osteoarthritis.      POSTOPERATIVE DIAGNOSIS:  Right knee osteoarthritis.      FINDINGS:  The patient was noted to have complete loss of cartilage and   bone-on-bone arthritis with associated osteophytes in the medial and patellofemoral compartments of   the knee with a significant synovitis and associated effusion.      PROCEDURE:  Right total knee replacement.      COMPONENTS USED:  DePuy rotating platform posterior stabilized knee   system, a size 2 femur, 2 tibia, 10 mm PS insert, and 35 patellar   button.      SURGEON:  Madlyn Frankel. Charlann Boxer, M.D.      ASSISTANT:  Lanney Gins, PA-C.      ANESTHESIA:  General.      SPECIMENS:  None.      COMPLICATION:  None.      DRAINS:  One Hemovac.  EBL: <100cc      TOURNIQUET TIME:   Total Tourniquet Time Documented: Thigh (Right) - 48 minutes Total: Thigh (Right) - 48 minutes.      The patient was stable to the recovery room.      INDICATION FOR PROCEDURE:  [name] is a 47 y.o. female patient of   mine.  The patient had been seen, evaluated, and treated conservatively in the   office with medication, activity modification, and injections.  The patient had   radiographic changes of bone-on-bone arthritis with endplate sclerosis and osteophytes noted.      The patient failed conservative measures including medication, injections, and activity modification, and at this point was ready for more definitive measures.   Based on the radiographic changes and failed conservative measures, the patient   decided to proceed with total knee replacement.  Risks of infection,   DVT, component failure, need for revision  surgery, postop course, and   expectations were all   discussed and reviewed.  Consent was obtained for benefit of pain   relief.      PROCEDURE IN DETAIL:  The patient was brought to the operative theater.   Once adequate anesthesia, preoperative antibiotics, 2 gm of Ancef administered, the patient was positioned supine with the right thigh tourniquet placed.  The  right lower extremity was prepped and draped in sterile fashion.  A time-   out was performed identifying the patient, planned procedure, and   extremity.      The right lower extremity was placed in the [facility] leg holder.  The leg was   exsanguinated, tourniquet elevated to 250 mmHg.  A midline incision was   made followed by median parapatellar arthrotomy.  Following initial   exposure, attention was first directed to the  patella.  Precut   measurement was noted to be 22 mm.  I resected down to 14 mm and used a   35 patellar button to restore patellar height as well as cover the cut   surface.      The lug holes were drilled and a metal shim was placed to protect the   patella from retractors and saw blades.      At this point, attention was now directed to the femur.  The femoral   canal was opened with a drill, irrigated to try to prevent fat emboli.  An   intramedullary rod was passed at 3 degrees valgus, 9 mm of bone was   resected off the distal femur due to pre-operative hyperextension.  Following this resection, the tibia was   subluxated anteriorly.  Using the extramedullary guide, 2 mm of bone was resected off   the proximal medial tibia.  We confirmed the gap would be   stable medially and laterally with a 10 mm insert as well as confirmed   the cut was perpendicular in the coronal plane, checking with an alignment rod.      Once this was done, I sized the femur to be a size 2 in the anterior-   posterior dimension, chose a standard component based on medial and   lateral dimension.  The size 2 rotation block  was then pinned in   position anterior referenced using the C-clamp to set rotation.  The   anterior, posterior, and  chamfer cuts were made without difficulty nor   notching making certain that I was along the anterior cortex to help   with flexion gap stability.      The final box cut was made off the lateral aspect of distal femur.      At this point, the tibia was sized to be a size 2, the size 2 tray was   then pinned in position through the medial third of the tubercle,   drilled, and keel punched.  Trial reduction was now carried with a 2 femur,  2 tibia, a 10 mm PS insert, and the 35 patella botton.  The knee was brought to   extension, full extension with good flexion stability with the patella   tracking through the trochlea without application of pressure.  Given   all these findings, the trial components removed.  Final components were   opened and cement was mixed.  The knee was irrigated with normal saline   solution and pulse lavage.  The synovial lining was   then injected with 0.25% Marcaine with epinephrine and 1 cc of Toradol,   total of 61 cc.      The knee was irrigated.  Final implants were then cemented onto clean and   dried cut surfaces of bone with the knee brought to extension with a 10 mm trial insert.      Once the cement had fully cured, the excess cement was removed   throughout the knee.  I confirmed I was satisfied with the range of   motion and stability, and the final 10 mm PS insert was chosen.  It was   placed into the knee.      The tourniquet had been let down at 48 minutes.  No significant   hemostasis required.  The medium Hemovac drain was placed deep.  The   extensor mechanism was then reapproximated using #1 Vicryl with the knee   in flexion.  The  remaining wound was closed with 2-0 Vicryl and running 4-0 Monocryl.   The knee was cleaned, dried, dressed sterilely using Dermabond and   Aquacel dressing.  Drain site dressed separately.   The patient was then   brought to recovery room in stable condition, tolerating the procedure   well.   Please note that Physician Assistant, Lanney Gins, was present for the entirety of the case, and was utilized for pre-operative positioning, peri-operative retractor management, general facilitation of the procedure.  He was also utilized for primary wound closure at the end of the case.              Madlyn Frankel Charlann Boxer, M.D.

## 2013-01-31 NOTE — Transfer of Care (Signed)
Immediate Anesthesia Transfer of Care Note  Patient: [name]  Procedure(s) Performed: Procedure(s): RIGHT TOTAL KNEE ARTHROPLASTY (Right)  Patient Location: PACU  Anesthesia Type:General  Level of Consciousness: awake, alert , oriented and patient cooperative  Airway & Oxygen Therapy: Patient Spontanous Breathing and Patient connected to face mask oxygen  Post-op Assessment: Report given to PACU RN and Post -op Vital signs reviewed and stable  Post vital signs: stable  Complications: No apparent anesthesia complications

## 2013-01-31 NOTE — Preoperative (Signed)
Beta Blockers   Reason not to administer Beta Blockers:Not Applicable 

## 2013-01-31 NOTE — Interval H&P Note (Signed)
History and Physical Interval Note:  01/31/2013 1:07 PM  [name]  has presented today for surgery, with the diagnosis of osteoarthritis right knee  The various methods of treatment have been discussed with the patient and family. After consideration of risks, benefits and other options for treatment, the patient has consented to  Procedure(s): RIGHT TOTAL KNEE ARTHROPLASTY (Right) as a surgical intervention .  The patient's history has been reviewed, patient examined, no change in status, stable for surgery.  I have reviewed the patient's chart and labs.  Questions were answered to the patient's satisfaction.     [name]

## 2013-02-01 ENCOUNTER — Encounter (HOSPITAL_COMMUNITY): Payer: Self-pay | Admitting: Orthopedic Surgery

## 2013-02-01 DIAGNOSIS — D62 Acute posthemorrhagic anemia: Secondary | ICD-10-CM | POA: Diagnosis not present

## 2013-02-01 DIAGNOSIS — E669 Obesity, unspecified: Secondary | ICD-10-CM | POA: Diagnosis present

## 2013-02-01 LAB — CBC
HCT: 35.7 % — ABNORMAL LOW (ref 36.0–46.0)
Hemoglobin: 11.7 g/dL — ABNORMAL LOW (ref 12.0–15.0)
MCV: 91.5 fL (ref 78.0–100.0)
RDW: 14 % (ref 11.5–15.5)
WBC: 10.6 10*3/uL — ABNORMAL HIGH (ref 4.0–10.5)

## 2013-02-01 LAB — BASIC METABOLIC PANEL
BUN: 7 mg/dL (ref 6–23)
CO2: 22 mEq/L (ref 19–32)
Calcium: 9.2 mg/dL (ref 8.4–10.5)
Creatinine, Ser: 0.59 mg/dL (ref 0.50–1.10)
GFR calc non Af Amer: >90 mL/min (ref >90)
Glucose, Bld: 146 mg/dL — ABNORMAL HIGH (ref 70–99)
Potassium: 4.2 mEq/L (ref 3.5–5.1)
Sodium: 136 mEq/L (ref 135–145)

## 2013-02-01 MED ORDER — TUBERCULIN PPD 5 UNIT/0.1ML ID SOLN
5.0000 [IU] | Freq: Once | INTRADERMAL | Status: DC
  Administered 2013-02-01: 17:00:00 5 [IU] via INTRADERMAL
  Filled 2013-02-01: qty 0.1

## 2013-02-01 NOTE — Progress Notes (Signed)
   Subjective: 1 Day Post-Op Procedure(s) (LRB): RIGHT TOTAL KNEE ARTHROPLASTY (Right)   Patient reports pain as mild, pain well controlled. No events throughout the night.  Objective:   VITALS:   Filed Vitals:   02/01/13 1038  BP: 131/85  Pulse: 101  Temp: 98.7 F (37.1 C)  Resp: 16    Neurovascular intact Dorsiflexion/Plantar flexion intact Incision: dressing C/D/I No cellulitis present Compartment soft  LABS  Recent Labs  02/01/13 0455  HGB 11.7*  HCT 35.7*  WBC 10.6*  PLT 325     Recent Labs  02/01/13 0455  NA 136  K 4.2  BUN 7  CREATININE 0.59  GLUCOSE 146*     Assessment/Plan: 1 Day Post-Op Procedure(s) (LRB): RIGHT TOTAL KNEE ARTHROPLASTY (Right) HV drain d/c'ed Foley cath d/c'ed Advance diet Up with therapy D/C IV fluids Discharge to SNF eventually when ready  Expected ABLA  Treated with iron and will observe  Obese (BMI 30-39.9) Estimated body mass index is 36.33 kg/(m^2) as calculated from the following:   Height as of this encounter: 5' (1.524 m).   Weight as of this encounter: 84.369 kg (186 lb). Patient also counseled that weight may inhibit the healing process Patient counseled that losing weight will help with future health issues      Anastasio Auerbach. Stephanne Greeley   PAC  02/01/2013, 11:11 AM

## 2013-02-01 NOTE — Progress Notes (Signed)
Clinical Social Work Department BRIEF PSYCHOSOCIAL ASSESSMENT 02/01/2013  Patient:  [name], [name]     Account Number:  [id number]     Admit date:  01/31/2013  Clinical Social Worker:  Candie Chroman  Date/Time:  02/01/2013 11:06 AM  Referred by:  Physician  Date Referred:  02/01/2013 Referred for  SNF Placement   Other Referral:   Interview type:  Patient Other interview type:    PSYCHOSOCIAL DATA Living Status:  HUSBAND Admitted from facility:   Level of care:   Primary support name:  Victorio Palm Primary support relationship to patient:  SPOUSE Degree of support available:   supportive    CURRENT CONCERNS  Other Concerns:    SOCIAL WORK ASSESSMENT / PLAN Pt is a 47 yr old female living at home prior to hospitalization. CSW met with pt / spouse to assist with d/c planning. ST Rehab will be needed following hospital d/c. Pt is hoping [facility] will have an opening when she is stable for d/c. SNF contacted and will admit pt if there is availability. CSW has initiated SNF search and will provide bed offers in case [facility] is unable to assist.   Assessment/plan status:  Psychosocial Support/Ongoing Assessment of Needs Other assessment/ plan:   Information/referral to community resources:   SNF list with bed offers to be provided.    PATIENT'S/FAMILY'S RESPONSE TO PLAN OF CARE: Pt is hopeful [facility] will accept her for rehab. Pt is willing to consider other options if necessary.   Cori Razor LCSW [phone]

## 2013-02-01 NOTE — Progress Notes (Signed)
Physical Therapy Treatment Patient Details Name: Vertie Dibbern MRN: [id number] DOB: 09-15-1965 Today's Date: 02/01/2013 Time: 9147-8295 PT Time Calculation (min): 16 min  PT Assessment / Plan / Recommendation  History of Present Illness     PT Comments     Follow Up Recommendations  SNF     Does the patient have the potential to tolerate intense rehabilitation     Barriers to Discharge        Equipment Recommendations  None recommended by PT    Recommendations for Other Services OT consult  Frequency 7X/week   Progress towards PT Goals Progress towards PT goals: Progressing toward goals  Plan Current plan remains appropriate    Precautions / Restrictions Precautions Precautions: Knee;Fall Restrictions Weight Bearing Restrictions: No Other Position/Activity Restrictions: WBAT   Pertinent Vitals/Pain 8/10; premed, ice packs provided    Mobility  Bed Mobility Bed Mobility: Not assessed Transfers Transfers: Sit to Stand;Stand to Sit Sit to Stand: 4: Min assist Stand to Sit: 4: Min assist Details for Transfer Assistance: cues for LE management and use of UEs to self assist Ambulation/Gait Ambulation/Gait Assistance: 4: Min assist Ambulation Distance (Feet): 78 Feet Assistive device: Rolling walker Ambulation/Gait Assistance Details: cues for sequence, stride length, position from RW and posture Gait Pattern: Step-to pattern;Decreased step length - right;Decreased step length - left;Shuffle;Antalgic;Trunk flexed Stairs: No    Exercises     PT Diagnosis:    PT Problem List:   PT Treatment Interventions:     PT Goals (current goals can now be found in the care plan section) Acute Rehab PT Goals Patient Stated Goal: Resume previous lifestyle with decreased pain PT Goal Formulation: With patient Time For Goal Achievement: 02/08/13 Potential to Achieve Goals: Good  Visit Information  Last PT Received On: 02/01/13 Assistance Needed: +1    Subjective Data  Patient Stated Goal: Resume previous lifestyle with decreased pain   Cognition  Cognition Arousal/Alertness: Awake/alert Behavior During Therapy: WFL for tasks assessed/performed Overall Cognitive Status: Within Functional Limits for tasks assessed    Balance     End of Session PT - End of Session Equipment Utilized During Treatment: Gait belt Activity Tolerance: Patient tolerated treatment well Patient left: in chair;with call bell/phone within reach;with family/visitor present Nurse Communication: Mobility status   GP     Javarus Dorner 02/01/2013, 5:20 PM

## 2013-02-01 NOTE — Progress Notes (Signed)
Clinical Social Work Department CLINICAL SOCIAL WORK PLACEMENT NOTE 02/01/2013  Patient:  [name], [name]  Account Number:  [id number] Admit date:  01/31/2013  Clinical Social Worker:  Cori Razor, LCSW  Date/time:  02/01/2013 12:00 M  Clinical Social Work is seeking post-discharge placement for this patient at the following level of care:   SKILLED NURSING   (*CSW will update this form in Epic as items are completed)   02/01/2013  Patient/family provided with [facility] Department of Clinical Social Work's list of facilities offering this level of care within the geographic area requested by the patient (or if unable, by the patient's family).  02/01/2013  Patient/family informed of their freedom to choose among providers that offer the needed level of care, that participate in Medicare, Medicaid or managed care program needed by the patient, have an available bed and are willing to accept the patient.    Patient/family informed of [facility]' ownership interest in [facility], as well as of the fact that they are under no obligation to receive care at this facility.  PASARR submitted to EDS on 02/01/2013 PASARR number received from [employer] on 02/01/2013  FL2 transmitted to all facilities in geographic area requested by pt/family on  02/01/2013 FL2 transmitted to all facilities within larger geographic area on   Patient informed that his/her managed care company has contracts with or will negotiate with  certain facilities, including the following:     Patient/family informed of bed offers received:   Patient chooses bed at  Physician recommends and patient chooses bed at    Patient to be transferred to  on   Patient to be transferred to facility by   The following physician request were entered in Epic:   Additional Comments:  Cori Razor LCSW [phone]

## 2013-02-01 NOTE — Progress Notes (Signed)
Utilization review completed.  

## 2013-02-01 NOTE — Progress Notes (Signed)
OT Cancellation Note  Patient Details Name: [name] MRN: [id number] DOB: 24-Jun-1965   Cancelled Treatment:    Reason Eval/Treat Not Completed: Other (comment).  Pt plans SNF for rehab.  Will defer OT eval to that venue.    Al Bracewell 02/01/2013, 10:46 AM Marica Otter, OTR/L [phone] 02/01/2013

## 2013-02-01 NOTE — Evaluation (Signed)
Physical Therapy Evaluation Patient Details Name: [name] MRN: [id number] DOB: 07/18/1965 Today's Date: 02/01/2013 Time: 4098-1191 PT Time Calculation (min): 30 min  PT Assessment / Plan / Recommendation History of Present Illness     Clinical Impression  Pt s/p R TKR presents with decreased R LE strength/ROM and post op pain limiting functional mobility.  Pt would benefit from follow up rehab at SNF level prior to return home with limited assist    PT Assessment  Patient needs continued PT services    Follow Up Recommendations  SNF    Does the patient have the potential to tolerate intense rehabilitation      Barriers to Discharge        Equipment Recommendations  None recommended by PT    Recommendations for Other Services OT consult   Frequency 7X/week    Precautions / Restrictions Precautions Precautions: Knee;Fall Restrictions Weight Bearing Restrictions: No Other Position/Activity Restrictions: WBAT   Pertinent Vitals/Pain 9/10 (but smiling); premed, cold packs provided      Mobility  Bed Mobility Bed Mobility: Not assessed Transfers Transfers: Sit to Stand;Stand to Sit Sit to Stand: 4: Min assist Stand to Sit: 4: Min assist Details for Transfer Assistance: cues for LE management and use of UEs to self assist Ambulation/Gait Ambulation/Gait Assistance: 4: Min assist Ambulation Distance (Feet): 38 Feet Assistive device: Rolling walker Ambulation/Gait Assistance Details: cues for sequence, posture, position from RW and stride length Gait Pattern: Step-to pattern;Decreased step length - right;Decreased step length - left;Shuffle;Antalgic;Trunk flexed Stairs: No    Exercises Total Joint Exercises Ankle Circles/Pumps: AROM;Both;10 reps;Supine Quad Sets: AROM;Both;10 reps;Supine Heel Slides: AAROM;Right;15 reps;Supine Straight Leg Raises: AROM;Right;AAROM;15 reps;Supine   PT Diagnosis: Difficulty walking  PT Problem List: Decreased  strength;Decreased range of motion;Decreased activity tolerance;Decreased mobility;Decreased knowledge of use of DME;Pain PT Treatment Interventions: DME instruction;Gait training;Stair training;Functional mobility training;Therapeutic activities;Therapeutic exercise;Patient/family education     PT Goals(Current goals can be found in the care plan section) Acute Rehab PT Goals Patient Stated Goal: Resume previous lifestyle with decreased pain PT Goal Formulation: With patient Time For Goal Achievement: 02/08/13 Potential to Achieve Goals: Good  Visit Information  Last PT Received On: 02/01/13 Assistance Needed: +1       Prior Functioning  Home Living Family/patient expects to be discharged to:: Skilled nursing facility Living Arrangements: Spouse/significant other Home Equipment: Dan Humphreys - 2 wheels;Shower seat Prior Function Level of Independence: Independent with assistive device(s) Comments: Patient with bil. knee pain pta, impacting gait - unsteady. Communication Communication: No difficulties Dominant Hand: Right    Cognition  Cognition Arousal/Alertness: Awake/alert Behavior During Therapy: WFL for tasks assessed/performed Overall Cognitive Status: Within Functional Limits for tasks assessed    Extremity/Trunk Assessment Upper Extremity Assessment Upper Extremity Assessment: Overall WFL for tasks assessed Lower Extremity Assessment Lower Extremity Assessment: RLE deficits/detail RLE Deficits / Details: quad strength 3/5 with aarom at knee -8 - 55 Cervical / Trunk Assessment Cervical / Trunk Assessment: Normal   Balance    End of Session PT - End of Session Equipment Utilized During Treatment: Gait belt Activity Tolerance: Patient tolerated treatment well Patient left: in chair;with call bell/phone within reach;with family/visitor present Nurse Communication: Mobility status  GP     Jakeisha Stricker 02/01/2013, 12:29 PM

## 2013-02-02 LAB — BASIC METABOLIC PANEL
CO2: 26 mEq/L (ref 19–32)
Calcium: 9.6 mg/dL (ref 8.4–10.5)
Creatinine, Ser: 0.55 mg/dL (ref 0.50–1.10)
GFR calc Af Amer: >90 mL/min (ref >90)
GFR calc non Af Amer: >90 mL/min (ref >90)
Potassium: 4.3 mEq/L (ref 3.5–5.1)
Sodium: 139 mEq/L (ref 135–145)

## 2013-02-02 LAB — CBC
Hemoglobin: 10.9 g/dL — ABNORMAL LOW (ref 12.0–15.0)
MCH: 29.6 pg (ref 26.0–34.0)
MCHC: 32.2 g/dL (ref 30.0–36.0)
MCV: 91.8 fL (ref 78.0–100.0)
Platelets: 295 10*3/uL (ref 150–400)
RDW: 14.2 % (ref 11.5–15.5)

## 2013-02-02 NOTE — Care Management Note (Signed)
    Page 1 of 1   02/02/2013     5:11:04 PM   CARE MANAGEMENT NOTE 02/02/2013  Patient:  [name], [name]   Account Number:  [id number]  Date Initiated:  02/02/2013  Documentation initiated by:  Colleen Can  Subjective/Objective Assessment:   dx total rt knee replacemnt     Action/Plan:   SNf rehab   Anticipated DC Date:  02/03/2013   Anticipated DC Plan:  SKILLED NURSING FACILITY  In-house referral  Clinical Social Worker      DC Planning Services  CM consult      Choice offered to / List presented to:             Status of service:  Completed, signed off Medicare Important Message given?  NA - LOS <3 / Initial given by admissions (If response is "NO", the following Medicare IM given date fields will be blank) Date Medicare IM given:   Date Additional Medicare IM given:    Discharge Disposition:    Per UR Regulation:    If discussed at Long Length of Stay Meetings, dates discussed:    Comments:

## 2013-02-02 NOTE — Progress Notes (Signed)
   Subjective: 2 Days Post-Op Procedure(s) (LRB): RIGHT TOTAL KNEE ARTHROPLASTY (Right)   Patient reports pain as mild, pain well controlled. No events throughout the night.   Objective:   VITALS:   Filed Vitals:   02/02/13 0648  BP: 132/87  Pulse: 76  Temp: 98.2 F (36.8 C)  Resp: 16    Neurovascular intact Dorsiflexion/Plantar flexion intact Incision: dressing C/D/I No cellulitis present Compartment soft  LABS  Recent Labs  02/01/13 0455 02/02/13 0458  HGB 11.7* 10.9*  HCT 35.7* 33.8*  WBC 10.6* 10.0  PLT 325 295     Recent Labs  02/01/13 0455 02/02/13 0458  NA 136 139  K 4.2 4.3  BUN 7 8  CREATININE 0.59 0.55  GLUCOSE 146* 140*     Assessment/Plan: 2 Days Post-Op Procedure(s) (LRB): RIGHT TOTAL KNEE ARTHROPLASTY (Right) Up with therapy Discharge to SNF eventually, when ready  Expected ABLA  Treated with iron and will observe   Obese (BMI 30-39.9)  Estimated body mass index is 36.33 kg/(m^2) as calculated from the following:      Height as of this encounter: 5' (1.524 m).      Weight as of this encounter: 84.369 kg (186 lb).  Patient also counseled that weight may inhibit the healing process  Patient counseled that losing weight will help with future health issues        Anastasio Auerbach. Laisha Rau   PAC  02/02/2013, 8:01 AM

## 2013-02-02 NOTE — Progress Notes (Signed)
Physical Therapy Treatment Patient Details Name: [name] MRN: [id number] DOB: 1966/02/16 Today's Date: 02/02/2013 Time: 1200-1228 PT Time Calculation (min): 28 min  PT Assessment / Plan / Recommendation  History of Present Illness Patient is a 47 yo female admitted with chest wall pain, Lt UE/side "tingling", headache, blurry vision, and Lt facial droop.   PT Comments   POD # 2 am session.  Pt OOB in recliner.  Amb in hallway then performed TKR TE's followed by ICE.   Follow Up Recommendations  SNF (pt plans to D/C to [facility])     Does the patient have the potential to tolerate intense rehabilitation     Barriers to Discharge        Equipment Recommendations       Recommendations for Other Services    Frequency 7X/week   Progress towards PT Goals Progress towards PT goals: Progressing toward goals  Plan      Precautions / Restrictions Precautions Precautions: Knee;Fall Restrictions Weight Bearing Restrictions: No Other Position/Activity Restrictions: WBAT    Pertinent Vitals/Pain C/o 5/10 with TE's ICE applied    Mobility  Bed Mobility Bed Mobility: Not assessed Details for Bed Mobility Assistance: Pt OOB in recliner Transfers Transfers: Sit to Stand;Stand to Sit Sit to Stand: 4: Min assist;4: Min guard;From chair/3-in-1 Stand to Sit: 4: Min assist;4: Min guard Details for Transfer Assistance: cues for LE management and use of UEs to self assist Ambulation/Gait Ambulation/Gait Assistance: 4: Min assist;4: Min Government social research officer (Feet): 85 Feet Assistive device: Rolling walker Ambulation/Gait Assistance Details: increased time and 25% VC's to increase step length. Gait Pattern: Step-to pattern;Decreased step length - right;Decreased step length - left;Shuffle;Antalgic;Trunk flexed Gait velocity: decreased    Exercises   Total Knee Replacement TE's 10 reps B LE ankle pumps 10 reps knee presses 10 reps heel slides  10 reps SAQ's 10 reps  SLR's 10 reps ABD Followed by ICE    PT Goals (current goals can now be found in the care plan section)    Visit Information  Last PT Received On: 02/02/13 Assistance Needed: +1 History of Present Illness: Patient is a 47 yo female admitted with chest wall pain, Lt UE/side "tingling", headache, blurry vision, and Lt facial droop.    Subjective Data      Cognition       Balance     End of Session PT - End of Session Equipment Utilized During Treatment: Gait belt Activity Tolerance: Patient tolerated treatment well Patient left: in chair;with call bell/phone within reach;with family/visitor present   Felecia Shelling  PTA [facility]  Acute  Rehab Pager      [phone]

## 2013-02-02 NOTE — Progress Notes (Signed)
Physical Therapy Treatment Patient Details Name: [name] MRN: [id number] DOB: 1965-10-12 Today's Date: 02/02/2013 Time: 8295-6213 PT Time Calculation (min): 26 min  PT Assessment / Plan / Recommendation  History of Present Illness Patient is a 47 yo female admitted with chest wall pain, Lt UE/side "tingling", headache, blurry vision, and Lt facial droop.   PT Comments   POD # 2 pm session.  Pt just woke from a nap.  Assisted out of recliner chair to amb second time in hallway.  Increased c/o pain and decreased amb distance.  Positioned in recliner with ICE.   Follow Up Recommendations  SNF     Does the patient have the potential to tolerate intense rehabilitation     Barriers to Discharge        Equipment Recommendations  None recommended by PT    Recommendations for Other Services    Frequency 7X/week   Progress towards PT Goals Progress towards PT goals: Progressing toward goals  Plan      Precautions / Restrictions Precautions Precautions: Knee;Fall Restrictions Weight Bearing Restrictions: No Other Position/Activity Restrictions: WBAT    Pertinent Vitals/Pain C/o 8/10 ICE applied    Mobility  Bed Mobility Bed Mobility: Not assessed Details for Bed Mobility Assistance: Pt OOB in recliner Transfers Transfers: Sit to Stand;Stand to Sit Sit to Stand: 4: Min assist;4: Min guard;From chair/3-in-1 Stand to Sit: 4: Min assist;4: Min guard Details for Transfer Assistance: cues for LE management and use of UEs to self assist Ambulation/Gait Ambulation/Gait Assistance: 4: Min assist;4: Min Government social research officer (Feet): 65 Feet Assistive device: Rolling walker Ambulation/Gait Assistance Details: increased time with more c/o pain thios afternoon Gait Pattern: Step-to pattern;Decreased step length - right;Decreased step length - left;Shuffle;Antalgic;Trunk flexed Gait velocity: decreased    PT Goals (current goals can now be found in the care plan section)     Visit Information  Last PT Received On: 02/02/13 Assistance Needed: +1 History of Present Illness: Patient is a 47 yo female admitted with chest wall pain, Lt UE/side "tingling", headache, blurry vision, and Lt facial droop.    Subjective Data      Cognition       Balance     End of Session PT - End of Session Equipment Utilized During Treatment: Gait belt Activity Tolerance: Patient tolerated treatment well Patient left: in chair;with call bell/phone within reach;with family/visitor present   Felecia Shelling  PTA [facility]  Acute  Rehab Pager      [phone]

## 2013-02-03 MED ORDER — METHOCARBAMOL 500 MG PO TABS: 500.0000 mg | ORAL_TABLET | Freq: Four times a day (QID) | ORAL | Status: DC | PRN

## 2013-02-03 MED ORDER — ASPIRIN 325 MG PO TBEC: 325.0000 mg | DELAYED_RELEASE_TABLET | Freq: Two times a day (BID) | ORAL | Status: DC

## 2013-02-03 MED ORDER — ALBUTEROL SULFATE (5 MG/ML) 0.5% IN NEBU: 2.5000 mg | INHALATION_SOLUTION | Freq: Four times a day (QID) | RESPIRATORY_TRACT | Status: DC | PRN

## 2013-02-03 MED ORDER — FERROUS SULFATE 325 (65 FE) MG PO TABS: 325.0000 mg | ORAL_TABLET | Freq: Three times a day (TID) | ORAL | Status: DC

## 2013-02-03 MED ORDER — DSS 100 MG PO CAPS: 100.0000 mg | ORAL_CAPSULE | Freq: Two times a day (BID) | ORAL | Status: DC

## 2013-02-03 MED ORDER — HYDROMORPHONE HCL 2 MG PO TABS: 2.0000 mg | ORAL_TABLET | ORAL | Status: DC | PRN

## 2013-02-03 MED ORDER — POLYETHYLENE GLYCOL 3350 17 G PO PACK: 17.0000 g | PACK | Freq: Two times a day (BID) | ORAL | Status: DC

## 2013-02-03 NOTE — Discharge Summary (Signed)
Physician Discharge Summary  Patient ID: [name] MRN: [id number] DOB/AGE: 1965-11-17 47 y.o.  Admit date: 01/31/2013 Discharge date:  02/03/2013  Procedures:  Procedure(s) (LRB): RIGHT TOTAL KNEE ARTHROPLASTY (Right)  Attending Physician:  Dr. Durene Romans   Admission Diagnoses:   Right knee OA / pain  Discharge Diagnoses:  Principal Problem:   S/P right TKA Active Problems:   Expected blood loss anemia   Obese  Past Medical History  Diagnosis Date  . Tendonitis   . Obesity   . Hypertension     no problems since weight loss -'09  . Asthma   . Multiple food allergies     Latex allergy-respiratory  . GERD (gastroesophageal reflux disease)   . Headache(784.0)     occ. with allergies/sinus issues  . Arthritis     osteoarthritis-knees  . 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 female, has a history of pain and functional disability in the right knee due to arthritis and has failed non-surgical conservative treatments for greater than 12 weeks to includeNSAID's and/or analgesics, corticosteriod injections and activity modification. Onset of symptoms was gradual, starting 3+ years ago with gradually worsening course since that time. The patient noted prior procedures on the knee to include arthroscopy on the right knee(s). Patient currently rates pain in the right knee(s) at 10 out of 10 with activity. Patient has night pain, worsening of pain with activity and weight bearing, pain that interferes with activities of daily living, pain with passive range of motion, crepitus and joint swelling. Patient has evidence of periarticular osteophytes and joint space narrowing by imaging studies. There is no active infection. Risks, benefits and expectations were discussed with the patient. Patient understand the risks, benefits and expectations and wishes to proceed with surgery.   PCP:  Sheri Dials, MD   Discharged Condition: good  Hospital Course:  Patient underwent the above stated procedure on 01/31/2013. Patient tolerated the procedure well and brought to the recovery room in good condition and subsequently to the floor.  POD #1 BP: 131/85 ; Pulse: 101 ; Temp: 98.7 F (37.1 C) ; Resp: 16  Pt's foley was removed, as well as the hemovac drain removed. IV was changed to a saline lock. Patient reports pain as mild, pain well controlled. No events throughout the night. Neurovascular intact, dorsiflexion/plantar flexion intact, incision: dressing C/D/I, no cellulitis present and compartment soft.   LABS  Basename    HGB  11.7  HCT  35.7   POD #2  BP: 132/87 ; Pulse: 76 ; Temp: 98.2 F (36.8 C) ; Resp: 16  Patient reports pain as mild, pain well controlled. No events throughout the night.  Neurovascular intact, dorsiflexion/plantar flexion intact, incision: dressing C/D/I, no cellulitis present and compartment soft.   LABS  Basename    HGB  10.9  HCT  33.8   POD #3  BP: 142/82 ; Pulse: 87 ; Temp: 98.2 F (36.8 C) ; Resp: 16  Patient reports pain as mild, pain well controlled. No events throughout the night. Ready to be discharged to SNF. Neurovascular intact, dorsiflexion/plantar flexion intact, incision: dressing C/D/I, no cellulitis present and compartment soft.   LABS   No new labs  Discharge Exam: General appearance: alert, cooperative and no distress Extremities: Homans sign is negative, no sign of DVT, no edema, redness or tenderness in the calves or thighs and no ulcers, gangrene  or trophic changes  Disposition:  Home or Self Care with follow up in 2 weeks   Follow-up Information   Follow up with Sheri Pal, MD. Schedule an appointment as soon as possible for a visit in 2 weeks.   Specialty:  Orthopedic Surgery   Contact information:   [address] [place] [address] [phone]       Discharge Orders   Future  Appointments Provider Department Dept Phone   03/23/2013 2:30 PM Ronal Fear, NP [facility] [phone]   Future Orders Complete By Expires   Call MD / Call 911  As directed    Comments:     If you experience chest pain or shortness of breath, CALL 911 and be transported to the hospital emergency room.  If you develope a fever above 101 F, pus (white drainage) or increased drainage or redness at the wound, or calf pain, call your surgeon's office.   Change dressing  As directed    Comments:     Maintain surgical dressing for 10-14 days, then change the dressing daily with sterile 4 x 4 inch gauze dressing and tape. Keep the area dry and clean.   Constipation Prevention  As directed    Comments:     Drink plenty of fluids.  Prune juice may be helpful.  You may use a stool softener, such as Colace (over the counter) 100 mg twice a day.  Use MiraLax (over the counter) for constipation as needed.   Diet - low sodium heart healthy  As directed    Discharge instructions  As directed    Comments:     Maintain surgical dressing for 10-14 days, then replace with gauze and tape. Keep the area dry and clean until follow up. Follow up in 2 weeks at [facility]. Call with any questions or concerns.   Driving restrictions  As directed    Comments:     No driving for 4 weeks   Increase activity slowly as tolerated  As directed    TED hose  As directed    Comments:     Use stockings (TED hose) for 2 weeks on both leg(s).  You may remove them at night for sleeping.   Weight bearing as tolerated  As directed    Questions:     Laterality:     Extremity:          Medication List         albuterol (5 MG/ML) 0.5% nebulizer solution  Commonly known as:  PROVENTIL  Take 5 mg by nebulization 2 (two) times daily.     albuterol 108 (90 BASE) MCG/ACT inhaler  Commonly known as:  PROVENTIL HFA;VENTOLIN HFA  Inhale 2 puffs into the lungs every 6 (six) hours as needed for  wheezing or shortness of breath.     aspirin 325 MG EC tablet  Take 1 tablet (325 mg total) by mouth 2 (two) times daily.     azelastine 137 MCG/SPRAY nasal spray  Commonly known as:  ASTELIN  Place 2 sprays into the nose at bedtime. Use in each nostril as directed     budesonide-formoterol 160-4.5 MCG/ACT inhaler  Commonly known as:  SYMBICORT  Inhale 2 puffs into the lungs 2 (two) times daily.     cetirizine 10 MG tablet  Commonly known as:  ZYRTEC  Take 10 mg by mouth daily.     clonazePAM 1 MG tablet  Commonly known as:  Scarlette Calico  Take 1 mg by mouth 4 (four) times daily.     DSS 100 MG Caps  Take 100 mg by mouth 2 (two) times daily.     EPINEPHrine 0.3 mg/0.3 mL Devi  Commonly known as:  EPI-PEN  Inject 0.3 mg into the muscle daily as needed. For severe allergic reaction     ferrous sulfate 325 (65 FE) MG tablet  Take 1 tablet (325 mg total) by mouth 3 (three) times daily after meals.     fluticasone 50 MCG/ACT nasal spray  Commonly known as:  FLONASE  Place 2 sprays into the nose daily.     HYDROmorphone 2 MG tablet  Commonly known as:  DILAUDID  Take 1-2 tablets (2-4 mg total) by mouth every 4 (four) hours as needed for pain.     hydrOXYzine 25 MG tablet  Commonly known as:  ATARAX/VISTARIL  Take 25 mg by mouth at bedtime.     methocarbamol 500 MG tablet  Commonly known as:  ROBAXIN  Take 1 tablet (500 mg total) by mouth every 6 (six) hours as needed (muscle spasms).     montelukast 10 MG tablet  Commonly known as:  SINGULAIR  Take 10 mg by mouth at bedtime.     multivitamin with minerals tablet  Take 1 tablet by mouth daily.     polycarbophil 625 MG tablet  Commonly known as:  FIBERCON  Take 625 mg by mouth daily.     polyethylene glycol packet  Commonly known as:  MIRALAX / GLYCOLAX  Take 17 g by mouth 2 (two) times daily.     PRISTIQ 50 MG 24 hr tablet  Generic drug:  desvenlafaxine  Take 50 mg by mouth every morning.     ranitidine 150 MG  tablet  Commonly known as:  ZANTAC  Take 150 mg by mouth at bedtime.     temazepam 7.5 MG capsule  Commonly known as:  RESTORIL  Take 15 mg by mouth at bedtime as needed. For insomnia     triamcinolone cream 0.1 %  Commonly known as:  KENALOG  Apply 0.1 application topically daily as needed (eczema).         Signed: Anastasio Auerbach. Pina Sirianni   PAC  02/03/2013, 7:55 AM

## 2013-02-03 NOTE — Progress Notes (Signed)
Attempted to call report to [facility] no answer.  Sharrell Ku RN

## 2013-02-03 NOTE — Progress Notes (Signed)
   Subjective: 3 Days Post-Op Procedure(s) (LRB): RIGHT TOTAL KNEE ARTHROPLASTY (Right)   Patient reports pain as mild, pain well controlled. No events throughout the night. Ready to be discharged to SNF.  Objective:   VITALS:   Filed Vitals:   02/03/13 0530  BP: 142/82  Pulse: 87  Temp: 98.2 F (36.8 C)  Resp: 16    Neurovascular intact Dorsiflexion/Plantar flexion intact Incision: dressing C/D/I No cellulitis present Compartment soft  LABS  Recent Labs  02/01/13 0455 02/02/13 0458  HGB 11.7* 10.9*  HCT 35.7* 33.8*  WBC 10.6* 10.0  PLT 325 295     Recent Labs  02/01/13 0455 02/02/13 0458  NA 136 139  K 4.2 4.3  BUN 7 8  CREATININE 0.59 0.55  GLUCOSE 146* 140*     Assessment/Plan: 3 Days Post-Op Procedure(s) (LRB): RIGHT TOTAL KNEE ARTHROPLASTY (Right) Up with therapy Discharge to SNF Follow up in 2 weeks at [facility]. Follow up with OLIN,Shaneece Stockburger D in 2 weeks.  Contact information:  [facility] [address] [place] [address] [phone]    Obese (BMI 30-39.9)  Estimated body mass index is 36.33 kg/(m^2) as calculated from the following:      Height as of this encounter: 5' (1.524 m).      Weight as of this encounter: 84.369 kg (186 lb).  Patient also counseled that weight may inhibit the healing process  Patient counseled that losing weight will help with future health issues       Anastasio Auerbach. Rickey Sadowski   PAC  02/03/2013, 7:44 AM

## 2013-02-03 NOTE — Progress Notes (Signed)
Clinical Social Work Department CLINICAL SOCIAL WORK PLACEMENT NOTE 02/03/2013  Patient:  [name], [name]  Account Number:  [id number] Admit date:  01/31/2013  Clinical Social Worker:  Cori Razor, LCSW  Date/time:  02/01/2013 12:00 M  Clinical Social Work is seeking post-discharge placement for this patient at the following level of care:   SKILLED NURSING   (*CSW will update this form in Epic as items are completed)   02/01/2013  Patient/family provided with [facility] Department of Clinical Social Work's list of facilities offering this level of care within the geographic area requested by the patient (or if unable, by the patient's family).  02/01/2013  Patient/family informed of their freedom to choose among providers that offer the needed level of care, that participate in Medicare, Medicaid or managed care program needed by the patient, have an available bed and are willing to accept the patient.    Patient/family informed of [facility]' ownership interest in [facility], as well as of the fact that they are under no obligation to receive care at this facility.  PASARR submitted to EDS on 02/01/2013 PASARR number received from [employer] on 02/01/2013  FL2 transmitted to all facilities in geographic area requested by pt/family on  02/01/2013 FL2 transmitted to all facilities within larger geographic area on   Patient informed that his/her managed care company has contracts with or will negotiate with  certain facilities, including the following:     Patient/family informed of bed offers received:  02/02/2013 Patient chooses bed at [facility] Physician recommends and patient chooses bed at    Patient to be transferred to [facility] on  02/03/2013 Patient to be transferred to facility by [facility]  The following physician request were entered in Epic:   Additional Comments:  Cori Razor LCSW [phone]

## 2013-02-03 NOTE — Progress Notes (Signed)
Physical Therapy Treatment Patient Details Name: [name] MRN: [id number] DOB: 10-28-1965 Today's Date: 02/03/2013 Time: 1191-4782 PT Time Calculation (min): 23 min  PT Assessment / Plan / Recommendation  History of Present Illness     PT Comments   POD # 3 R TKR amb pt in hallway.  Pt plans to D/C to SNF today for ST Rehab.  Follow Up Recommendations  SNF     Does the patient have the potential to tolerate intense rehabilitation     Barriers to Discharge        Equipment Recommendations       Recommendations for Other Services    Frequency 7X/week   Progress towards PT Goals Progress towards PT goals: Progressing toward goals  Plan      Precautions / Restrictions Precautions Precautions: Knee;Fall Restrictions Weight Bearing Restrictions: No Other Position/Activity Restrictions: WBAT    Pertinent Vitals/Pain C/o 8/10 meds requested    Mobility  Bed Mobility Bed Mobility: Not assessed Details for Bed Mobility Assistance: Pt OOB in recliner Transfers Transfers: Sit to Stand;Stand to Sit Sit to Stand: 4: Min guard;From chair/3-in-1 Stand to Sit: 4: Min guard;To chair/3-in-1 Details for Transfer Assistance: 25% VC's on safety with hand placement Ambulation/Gait Ambulation/Gait Assistance: 4: Min assist;4: Min guard Ambulation Distance (Feet): 80 Feet Assistive device: Rolling walker Ambulation/Gait Assistance Details: increased time Gait Pattern: Step-to pattern;Decreased step length - right;Decreased step length - left;Shuffle;Antalgic;Trunk flexed Gait velocity: decreased    PT Goals (current goals can now be found in the care plan section)    Visit Information  Last PT Received On: 02/03/13 Assistance Needed: +1    Subjective Data      Cognition       Balance     End of Session PT - End of Session Equipment Utilized During Treatment: Gait belt Activity Tolerance: Patient tolerated treatment well Patient left: in chair;with call bell/phone  within reach;with family/visitor present   Felecia Shelling  PTA [facility]  Acute  Rehab Pager      [phone]

## 2013-02-03 NOTE — Progress Notes (Signed)
Patient has refused to take albuterol since admission. She states that she only needs it at home due to mold in the house she has been renting for years and that her landlord does nothing to "clean it up". She also states that she plans to move soon so she doesn't have as many exacerbations. She has, however, continued to take the symbicort inhaler. RT assessment protocol completed. Orders changed accordingly.

## 2013-02-03 NOTE — Progress Notes (Signed)
Ppd skin test to right forearm negative  D Anne Ng

## 2013-02-03 NOTE — Anesthesia Postprocedure Evaluation (Signed)
  Anesthesia Post-op Note  Patient: [name]  Procedure(s) Performed: Procedure(s) (LRB): RIGHT TOTAL KNEE ARTHROPLASTY (Right)  Patient Location: PACU  Anesthesia Type: General  Level of Consciousness: awake and alert   Airway and Oxygen Therapy: Patient Spontanous Breathing  Post-op Pain: mild  Post-op Assessment: Post-op Vital signs reviewed, Patient's Cardiovascular Status Stable, Respiratory Function Stable, Patent Airway and No signs of Nausea or vomiting  Last Vitals:  Filed Vitals:   02/03/13 0530  BP: 142/82  Pulse: 87  Temp: 36.8 C  Resp: 16    Post-op Vital Signs: stable   Complications: No apparent anesthesia complications

## 2013-02-06 ENCOUNTER — Non-Acute Institutional Stay (SKILLED_NURSING_FACILITY): Payer: Medicare Other | Admitting: Internal Medicine

## 2013-02-06 ENCOUNTER — Encounter: Payer: Self-pay | Admitting: Internal Medicine

## 2013-02-06 DIAGNOSIS — G4700 Insomnia, unspecified: Secondary | ICD-10-CM

## 2013-02-06 DIAGNOSIS — F341 Dysthymic disorder: Secondary | ICD-10-CM

## 2013-02-06 DIAGNOSIS — K219 Gastro-esophageal reflux disease without esophagitis: Secondary | ICD-10-CM

## 2013-02-06 DIAGNOSIS — D509 Iron deficiency anemia, unspecified: Secondary | ICD-10-CM | POA: Insufficient documentation

## 2013-02-06 DIAGNOSIS — K5900 Constipation, unspecified: Secondary | ICD-10-CM | POA: Insufficient documentation

## 2013-02-06 DIAGNOSIS — M1711 Unilateral primary osteoarthritis, right knee: Secondary | ICD-10-CM | POA: Insufficient documentation

## 2013-02-06 DIAGNOSIS — F329 Major depressive disorder, single episode, unspecified: Secondary | ICD-10-CM | POA: Insufficient documentation

## 2013-02-06 DIAGNOSIS — M1710 Unilateral primary osteoarthritis, unspecified knee: Secondary | ICD-10-CM

## 2013-02-06 HISTORY — DX: Iron deficiency anemia, unspecified: D50.9

## 2013-02-06 HISTORY — DX: Insomnia, unspecified: G47.00

## 2013-02-06 NOTE — Progress Notes (Signed)
Patient ID: [name], female   DOB: September 14, 1965, 46 y.o.   MRN: [id number]  [facility]  PCP: Aura Dials, MD  Code Status: full code  Allergies  Allergen Reactions  . Banana Shortness Of Breath and Swelling  . Latex Shortness Of Breath  . Onion Shortness Of Breath and Swelling  . Codeine Swelling  . Hydrocodone Hives and Swelling  . Penicillins Swelling    Chief Complaint: new admit  HPI:  47 y/o female patient is herefor STR post hospital admission for right knee arthritis and undergoing right total knee arthroplasty. She was seen in her room today and denies any complaints. She has worked with therapy and doing good. Se complaints of pain in her muscles with tightness at present. She would like her pain meds to be adjusted to help bring her pain down to 5/10. See ros  Review of Systems  Constitutional: Negative for fever and chills.  HENT: Negative for congestion.   Eyes: Negative for blurred vision.  Respiratory: Negative for cough and shortness of breath.   Cardiovascular: Negative for chest pain and orthopnea.  Gastrointestinal: Negative for heartburn, nausea, vomiting, abdominal pain and constipation.  Genitourinary: Negative for dysuria.  Musculoskeletal: Positive for joint pain. Negative for falls.  Skin: Negative for rash.  Neurological: Negative for dizziness, seizures, loss of consciousness and headaches.  Endo/Heme/Allergies: Does not bruise/bleed easily.  Psychiatric/Behavioral: Negative for depression and memory loss.    Past Medical History  Diagnosis Date  . Tendonitis   . Obesity   . Hypertension     no problems since weight loss -'09  . Asthma   . Multiple food allergies     Latex allergy-respiratory  . GERD (gastroesophageal reflux disease)   . Headache(784.0)     occ. with allergies/sinus issues  . Arthritis     osteoarthritis-knees  . 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 Cesarean section      x4  . Abdominal hysterectomy    . Bowel resection    . Knee surgery      Right knee scope 8'12  . Total knee arthroplasty Right 01/31/2013    Procedure: RIGHT TOTAL KNEE ARTHROPLASTY;  Surgeon: [name], MD;  Location: [facility];  Service: Orthopedics;  Laterality: Right;   Social History:   reports that she quit smoking about 7 years ago. She has never used smokeless tobacco. She reports that she does not drink alcohol or use illicit drugs.  Family History  Problem Relation Age of Onset  . Lung cancer Mother   . Diabetes Father     Medications: Patient's Medications  New Prescriptions   No medications on file  Previous Medications   ALBUTEROL (PROVENTIL HFA;VENTOLIN HFA) 108 (90 BASE) MCG/ACT INHALER    Inhale 2 puffs into the lungs every 6 (six) hours as needed for wheezing or shortness of breath.   ALBUTEROL (PROVENTIL) (5 MG/ML) 0.5% NEBULIZER SOLUTION    Take 5 mg by nebulization 2 (two) times daily.    ASPIRIN EC 325 MG EC TABLET    Take 1 tablet (325 mg total) by mouth 2 (two) times daily.   AZELASTINE (ASTELIN) 137 MCG/SPRAY NASAL SPRAY    Place 2 sprays into the nose at bedtime. Use in each nostril as directed   BUDESONIDE-FORMOTEROL (SYMBICORT) 160-4.5 MCG/ACT INHALER    Inhale 2 puffs into  the lungs 2 (two) times daily.    CETIRIZINE (ZYRTEC) 10 MG TABLET    Take 10 mg by mouth daily.   CLONAZEPAM (KLONOPIN) 1 MG TABLET    Take 1 mg by mouth 4 (four) times daily.    DOCUSATE SODIUM 100 MG CAPS    Take 100 mg by mouth 2 (two) times daily.   EPINEPHRINE (EPI-PEN) 0.3 MG/0.3 ML DEVI    Inject 0.3 mg into the muscle daily as needed. For severe allergic reaction   FERROUS SULFATE 325 (65 FE) MG TABLET    Take 1 tablet (325 mg total) by mouth 3 (three) times daily after meals.   FLUTICASONE (FLONASE) 50 MCG/ACT NASAL SPRAY    Place 2 sprays into the nose daily.   HYDROMORPHONE (DILAUDID)  2 MG TABLET    Take 1-2 tablets (2-4 mg total) by mouth every 4 (four) hours as needed for pain.   HYDROXYZINE (ATARAX/VISTARIL) 25 MG TABLET    Take 25 mg by mouth at bedtime.   METHOCARBAMOL (ROBAXIN) 500 MG TABLET    Take 1 tablet (500 mg total) by mouth every 6 (six) hours as needed (muscle spasms).   MONTELUKAST (SINGULAIR) 10 MG TABLET    Take 10 mg by mouth at bedtime.    MULTIPLE VITAMINS-MINERALS (MULTIVITAMIN WITH MINERALS) TABLET    Take 1 tablet by mouth daily.   POLYCARBOPHIL (FIBERCON) 625 MG TABLET    Take 625 mg by mouth daily.   POLYETHYLENE GLYCOL (MIRALAX / GLYCOLAX) PACKET    Take 17 g by mouth 2 (two) times daily.   PRISTIQ 50 MG 24 HR TABLET    Take 50 mg by mouth every morning.    RANITIDINE (ZANTAC) 150 MG TABLET    Take 150 mg by mouth at bedtime.    TEMAZEPAM (RESTORIL) 7.5 MG CAPSULE    Take 15 mg by mouth at bedtime as needed. For insomnia   TRIAMCINOLONE CREAM (KENALOG) 0.1 %    Apply 0.1 application topically daily as needed (eczema).   Modified Medications   No medications on file  Discontinued Medications   No medications on file     Physical Exam: Filed Vitals:   02/06/13 1342  BP: 121/86  Pulse: 88  Temp: 98.3 F (36.8 C)  Resp: 18  SpO2: 96%   General- adult female in no acute distress Head- atraumatic, normocephalic Eyes- PERRLA, EOMI, no pallor, no icterus, no discharge Neck- no lymphadenopathy, no thyromegaly, no jugular vein distension, no carotid bruit Chest- no chest wall deformities, no chest wall tenderness Cardiovascular- normal s1,s2, no murmurs/ rubs/ gallops, trace edema in right leg Respiratory- bilateral clear to auscultation, no wheeze, no rhonchi, no crackles Abdomen- bowel sounds present, soft, non tender, no organomegaly, no abdominal bruits, no guarding or rigidity, no CVA tenderness Musculoskeletal- able to move all 4 extremities, rom limited in right knee, dressing in place and clean and dry, WBAT Neurological- no focal  deficit, normal reflexes, normal muscle strength, normal sensation to fine touch and vibration Psychiatry- alert and oriented to person, place and time, normal mood and affect   Labs reviewed: Basic Metabolic Panel:  Recent Labs  47/82/95 0915 02/01/13 0455 02/02/13 0458  NA 140 136 139  K 5.0 4.2 4.3  CL 104 104 102  CO2 26 22 26   GLUCOSE 112* 146* 140*  BUN 9 7 8   CREATININE 0.65 0.59 0.55  CALCIUM 9.8 9.2 9.6   CBC:  Recent Labs  11/26/12 1710 01/25/13 0915 02/01/13 0455  02/02/13 0458  WBC 7.4 6.6 10.6* 10.0  NEUTROABS 3.5  --   --   --   HGB 14.2 14.1 11.7* 10.9*  HCT 41.8 42.5 35.7* 33.8*  MCV 92.7 92.6 91.5 91.8  PLT 391 374 325 295   Cardiac Enzymes:  Recent Labs  11/27/12 0224 11/27/12 0800 11/27/12 1350  TROPONINI <0.30 <0.30 <0.30   BNP: No components found with this basename: POCBNP,  CBG:  Recent Labs  11/28/12 1621 11/28/12 2123 11/29/12 1154  GLUCAP 124* 139* 105*    Assessment/Plan  Right knee arthritis- s/p right TKA, to follow with orthopedics. Will continue her to work with therapy team for gait training. Fall precautions. Continue skin care. Continue aspirin for dvt prophylaxis. Will continue current regimen of muscle relaxant but have her get one half an hour prior to therapy. Will change dilaudid to 1-2 tab q3h prn for pain  Allergic rhinitis- continue cetirizine and nasal spray with prn albuterol  Anxiety- continue klonopin 1 mg qid to help with her nerves  Constipation- continue DSS and miralax for now  Iron def anemia- continue ferrous sulfate, monitor cbc esp with her being on aspirin  Asthma- breathing under control. Continue singulair and bronchodilators  GERD- symptoms controlled with ranitidine, monitor clinically  Insomnia- restoril to be continued at home regimen  Depression- continue desvenlafaxine and monitor her mood   Family/ staff Communication: reviewed care plan with patient and nursing  supervisor  Goals of care: to return home   Labs/tests ordered: cbc, cmp

## 2013-02-07 ENCOUNTER — Other Ambulatory Visit: Payer: Self-pay | Admitting: *Deleted

## 2013-02-07 MED ORDER — HYDROMORPHONE HCL 2 MG PO TABS: ORAL_TABLET | ORAL | Status: DC

## 2013-02-14 ENCOUNTER — Other Ambulatory Visit: Payer: Self-pay | Admitting: *Deleted

## 2013-02-14 MED ORDER — HYDROMORPHONE HCL 2 MG PO TABS: ORAL_TABLET | ORAL | Status: DC

## 2013-02-22 ENCOUNTER — Encounter: Payer: Self-pay | Admitting: Nurse Practitioner

## 2013-02-22 ENCOUNTER — Non-Acute Institutional Stay (SKILLED_NURSING_FACILITY): Payer: Medicare Other | Admitting: Nurse Practitioner

## 2013-02-22 DIAGNOSIS — M1710 Unilateral primary osteoarthritis, unspecified knee: Secondary | ICD-10-CM

## 2013-02-22 DIAGNOSIS — K219 Gastro-esophageal reflux disease without esophagitis: Secondary | ICD-10-CM

## 2013-02-22 DIAGNOSIS — Z9109 Other allergy status, other than to drugs and biological substances: Secondary | ICD-10-CM

## 2013-02-22 DIAGNOSIS — F329 Major depressive disorder, single episode, unspecified: Secondary | ICD-10-CM

## 2013-02-22 DIAGNOSIS — J45909 Unspecified asthma, uncomplicated: Secondary | ICD-10-CM

## 2013-02-22 DIAGNOSIS — M1711 Unilateral primary osteoarthritis, right knee: Secondary | ICD-10-CM

## 2013-02-22 DIAGNOSIS — D509 Iron deficiency anemia, unspecified: Secondary | ICD-10-CM

## 2013-02-22 NOTE — Progress Notes (Signed)
Patient ID: [name], female   DOB: April 26, 1965, 47 y.o.   MRN: [id number]  Facility: [facility] Room number: 705  Date of visit 02/22/2013  CODE STATUS: Full Code  Allergies  Allergen Reactions  . Banana Shortness Of Breath and Swelling  . Latex Shortness Of Breath  . Onion Shortness Of Breath and Swelling  . Codeine Swelling  . Hydrocodone Hives and Swelling  . Penicillins Swelling   CC:  Discharge note    Medication List       This list is accurate as of: 02/22/13 11:59 PM.  Always use your most recent med list.               albuterol (5 MG/ML) 0.5% nebulizer solution  Commonly known as:  PROVENTIL  Take 5 mg by nebulization 2 (two) times daily.     albuterol 108 (90 BASE) MCG/ACT inhaler  Commonly known as:  PROVENTIL HFA;VENTOLIN HFA  Inhale 2 puffs into the lungs every 6 (six) hours as needed for wheezing or shortness of breath.     aspirin 325 MG EC tablet  Take 1 tablet (325 mg total) by mouth 2 (two) times daily.     azelastine 137 MCG/SPRAY nasal spray  Commonly known as:  ASTELIN  Place 2 sprays into the nose at bedtime. Use in each nostril as directed     budesonide-formoterol 160-4.5 MCG/ACT inhaler  Commonly known as:  SYMBICORT  Inhale 2 puffs into the lungs 2 (two) times daily.     cetirizine 10 MG tablet  Commonly known as:  ZYRTEC  Take 10 mg by mouth daily.     clonazePAM 1 MG tablet  Commonly known as:  KLONOPIN  Take 1 mg by mouth 4 (four) times daily.     DSS 100 MG Caps  Take 100 mg by mouth 2 (two) times daily.     EPINEPHrine 0.3 mg/0.3 mL Devi  Commonly known as:  EPI-PEN  Inject 0.3 mg into the muscle daily as needed. For severe allergic reaction     ferrous sulfate 325 (65 FE) MG tablet  Take 1 tablet (325 mg total) by mouth 3 (three) times daily after meals.     fluticasone 50 MCG/ACT nasal spray  Commonly known as:  FLONASE  Place 2 sprays into the nose daily.     HYDROmorphone 2 MG tablet  Commonly known as:   DILAUDID  Take one tablet by mouth every 4 hours as needed times 1 day. Then one tablet by mouth every 6 hours as needed times 1 day. Then one tablet by mouth every 8 hours as needed for 12 days     hydrOXYzine 25 MG tablet  Commonly known as:  ATARAX/VISTARIL  Take 25 mg by mouth at bedtime.     ibuprofen 800 MG tablet  Commonly known as:  ADVIL,MOTRIN  Take 800 mg by mouth 2 (two) times daily as needed for moderate pain.     methocarbamol 500 MG tablet  Commonly known as:  ROBAXIN  Take 1 tablet (500 mg total) by mouth every 6 (six) hours as needed (muscle spasms).     montelukast 10 MG tablet  Commonly known as:  SINGULAIR  Take 10 mg by mouth at bedtime.     multivitamin with minerals tablet  Take 1 tablet by mouth daily.     polycarbophil 625 MG tablet  Commonly known as:  FIBERCON  Take 625 mg by mouth daily.     polyethylene glycol packet  Commonly known as:  MIRALAX / GLYCOLAX  Take 17 g by mouth 2 (two) times daily.     PRISTIQ 50 MG 24 hr tablet  Generic drug:  desvenlafaxine  Take 50 mg by mouth every morning.     ranitidine 150 MG tablet  Commonly known as:  ZANTAC  Take 150 mg by mouth at bedtime.     temazepam 7.5 MG capsule  Commonly known as:  RESTORIL  Take 15 mg by mouth at bedtime as needed. For insomnia     triamcinolone cream 0.1 %  Commonly known as:  KENALOG  Apply 0.1 application topically daily as needed (eczema).        HPI:  patient status post right knee total arthroplasty. Her surgery was done 01/31/2013. She has been in [facility] for rehabilitation, including mobility, range of motion, and strengthening.   Past Medical History  Diagnosis Date  . Tendonitis   . Obesity   . Hypertension     no problems since weight loss -'09  . Asthma   . Multiple food allergies     Latex allergy-respiratory  . GERD (gastroesophageal reflux disease)   . Headache(784.0)     occ. with allergies/sinus issues  . Arthritis      osteoarthritis-knees  . 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 Cesarean section      x4  . Abdominal hysterectomy    . Bowel resection    . Knee surgery      Right knee scope 8'12  . Total knee arthroplasty Right 01/31/2013    Procedure: RIGHT TOTAL KNEE ARTHROPLASTY;  Surgeon: [name], MD;  Location: [facility];  Service: Orthopedics;  Laterality: Right;     Recent Labs: Done 11/03/2014CBC WBC 8.3 RBC 3.7 HGB 11.0 HCT 36.5 PLT 414 All other values WNL  Basic Metabolic Panel   NA 139 K 4.6 CL 101 CO2 3 a0 AGAP 8 GLUC 88 BUN 9 CR, S 0.6 BUN/CR 15.3 CA 9 GFR > 60 CL 10  BP 138/93  Pulse 106  Temp(Src) 97.2 F (36.2 C)  Resp 20  General:  Alert, verbally appropriate, oriented x 3, coverseant in no apparent distress Eyes:  PERRLA, EOMI, + red reflex, unable to fully visualize optic disc.  Conjunctiva pink Ears:  TM's unremarkable Oral Pharynx:  Remaining teeth, lower front, dentures not in place.  Oral mucousa without any apparent lesions Neck:  No palpable adenopathy, no thyromegaly Apical pulse with RRR, rate varying from 80's to approx 100, otherwise unremarkable Abdomen:  + bowel sounds, soft and undistended, non-tender BLE:  Easily palpable posterior tibial pulses, no significant lower extremity edema Integumentary/mcuculoskeletal:  Vertical scar on R knee well healed, pt noted to be walking well without apparent limp  Impression/Plan: S/P R total knee replacement, incision well healed, ambulating well.  Will d/c home with additional PT/OT Asthma:  Will continue singulair, symbicort, and albuterol inhaler/nebulizer, zrytec, and astelin nasal spray  Allergies:  Will continue for pt to carry epipen r/t possibility of anaphylaxis Depression:/anxiety  Will continue perstiq, and klonipin Insomnia:  Will continue restoril Anemia r/t blood loss  from surgery, continue iron and multivitamin, blood counts to be monitored by PCP Postoperative pain will allow limited amount of dilaudid, ok for limited use of ibuprofen, 600-800mg  twice a day, with zantac for GI protection  Pt will keep scheduled follow  ups with her orthopedist

## 2013-03-23 ENCOUNTER — Ambulatory Visit: Payer: Medicare Other | Admitting: Nurse Practitioner

## 2013-03-23 NOTE — Progress Notes (Signed)
Patient was no show for appointment

## 2013-05-29 ENCOUNTER — Emergency Department (HOSPITAL_COMMUNITY)
Admission: EM | Admit: 2013-05-29 | Discharge: 2013-05-29 | Discharge status: Against Medical Advice | Payer: Medicare Other | Attending: Emergency Medicine | Admitting: Emergency Medicine

## 2013-05-29 ENCOUNTER — Encounter (HOSPITAL_COMMUNITY): Payer: Self-pay | Admitting: Emergency Medicine

## 2013-05-29 DIAGNOSIS — I10 Essential (primary) hypertension: Secondary | ICD-10-CM | POA: Insufficient documentation

## 2013-05-29 DIAGNOSIS — L299 Pruritus, unspecified: Secondary | ICD-10-CM | POA: Insufficient documentation

## 2013-05-29 DIAGNOSIS — E669 Obesity, unspecified: Secondary | ICD-10-CM | POA: Insufficient documentation

## 2013-05-29 DIAGNOSIS — J45909 Unspecified asthma, uncomplicated: Secondary | ICD-10-CM | POA: Insufficient documentation

## 2013-05-29 NOTE — ED Notes (Addendum)
Pt presents with c/o allergic reaction that started last night. Pt told EMS that she is unsure of what she had a reaction but she started to feel itchy last night and her throat felt scratchy so her husband gave her the EPI pen. Pt says the EPI pen relieved her symptoms temporarily but her throat feels scratchy again today, breathing is WNL and per EMS pt has no hives or uticaria. EMS gave pt 50 benadryl chewable en route, swallowed without difficulty.

## 2013-05-29 NOTE — ED Notes (Signed)
Staffed called pt to recheck VS

## 2013-05-29 NOTE — ED Notes (Signed)
Pt says that she gave herself the EPI pen today not last night, pt is talking in complete sentences at this time. No acute distress at this time. Pt is only c/o nausea and headache at this time.

## 2013-05-29 NOTE — ED Notes (Signed)
Pt called again from waiting room with no response  

## 2013-06-27 DIAGNOSIS — Z87891 Personal history of nicotine dependence: Secondary | ICD-10-CM | POA: Insufficient documentation

## 2013-06-27 HISTORY — DX: Personal history of nicotine dependence: Z87.891

## 2015-12-16 DIAGNOSIS — J301 Allergic rhinitis due to pollen: Secondary | ICD-10-CM | POA: Insufficient documentation

## 2015-12-17 DIAGNOSIS — J45909 Unspecified asthma, uncomplicated: Secondary | ICD-10-CM | POA: Insufficient documentation

## 2015-12-17 DIAGNOSIS — M797 Fibromyalgia: Secondary | ICD-10-CM | POA: Insufficient documentation

## 2015-12-17 HISTORY — DX: Fibromyalgia: M79.7

## 2015-12-18 ENCOUNTER — Encounter: Payer: Self-pay | Admitting: Gastroenterology

## 2016-01-20 DIAGNOSIS — R928 Other abnormal and inconclusive findings on diagnostic imaging of breast: Secondary | ICD-10-CM | POA: Insufficient documentation

## 2016-01-20 HISTORY — DX: Other abnormal and inconclusive findings on diagnostic imaging of breast: R92.8

## 2016-01-27 DIAGNOSIS — Z803 Family history of malignant neoplasm of breast: Secondary | ICD-10-CM | POA: Insufficient documentation

## 2016-02-05 ENCOUNTER — Telehealth: Payer: Self-pay | Admitting: *Deleted

## 2016-02-05 NOTE — Telephone Encounter (Signed)
Patient no show PV today, Called patient. Left message for patient to return our call to reschedule pre-visit before 5 pm today.

## 2016-02-06 ENCOUNTER — Encounter: Payer: Self-pay | Admitting: Family Medicine

## 2016-02-17 ENCOUNTER — Encounter: Payer: Medicare Other | Admitting: Gastroenterology

## 2016-03-02 DIAGNOSIS — R7309 Other abnormal glucose: Secondary | ICD-10-CM

## 2016-03-02 DIAGNOSIS — Z8639 Personal history of other endocrine, nutritional and metabolic disease: Secondary | ICD-10-CM

## 2016-03-02 HISTORY — DX: Other abnormal glucose: R73.09

## 2016-03-02 HISTORY — DX: Personal history of other endocrine, nutritional and metabolic disease: Z86.39

## 2016-04-06 DIAGNOSIS — Z860100 Personal history of colon polyps, unspecified: Secondary | ICD-10-CM

## 2016-04-06 HISTORY — PX: COLONOSCOPY W/ POLYPECTOMY: SHX1380

## 2016-04-06 HISTORY — DX: Personal history of colon polyps, unspecified: Z86.0100

## 2016-04-06 HISTORY — DX: Personal history of colonic polyps: Z86.010

## 2016-04-15 ENCOUNTER — Ambulatory Visit: Payer: Medicare Other | Admitting: *Deleted

## 2016-04-15 VITALS — Ht 60.0 in | Wt 201.2 lb

## 2016-04-15 DIAGNOSIS — Z1211 Encounter for screening for malignant neoplasm of colon: Secondary | ICD-10-CM

## 2016-04-15 MED ORDER — NA SULFATE-K SULFATE-MG SULF 17.5-3.13-1.6 GM/177ML PO SOLN: 1.0000 | Freq: Once | ORAL | 0 refills | Status: AC

## 2016-04-15 NOTE — Progress Notes (Signed)
Denies allergies to eggs or soy products. Denies complications with sedation or anesthesia. Denies O2 use. Denies use of diet or weight loss medications.  Emmi instructions given for colonoscopy.  

## 2016-04-29 ENCOUNTER — Ambulatory Visit (AMBULATORY_SURGERY_CENTER): Payer: Medicare Other | Admitting: Gastroenterology

## 2016-04-29 ENCOUNTER — Encounter: Payer: Self-pay | Admitting: Gastroenterology

## 2016-04-29 VITALS — BP 128/87 | HR 98 | Temp 97.8°F | Resp 19 | Ht 60.0 in | Wt 201.0 lb

## 2016-04-29 DIAGNOSIS — D123 Benign neoplasm of transverse colon: Secondary | ICD-10-CM

## 2016-04-29 DIAGNOSIS — Z1212 Encounter for screening for malignant neoplasm of rectum: Secondary | ICD-10-CM | POA: Diagnosis not present

## 2016-04-29 DIAGNOSIS — Z1211 Encounter for screening for malignant neoplasm of colon: Secondary | ICD-10-CM

## 2016-04-29 DIAGNOSIS — K635 Polyp of colon: Secondary | ICD-10-CM | POA: Diagnosis not present

## 2016-04-29 DIAGNOSIS — D120 Benign neoplasm of cecum: Secondary | ICD-10-CM | POA: Diagnosis not present

## 2016-04-29 MED ORDER — SODIUM CHLORIDE 0.9 % IV SOLN: 500.0000 mL | INTRAVENOUS | Status: DC

## 2016-04-29 NOTE — Patient Instructions (Signed)
YOU HAD AN ENDOSCOPIC PROCEDURE TODAY AT THE [facility]:   Refer to the procedure report that was given to you for any specific questions about what was found during the examination.  If the procedure report does not answer your questions, please call your gastroenterologist to clarify.  If you requested that your care partner not be given the details of your procedure findings, then the procedure report has been included in a sealed envelope for you to review at your convenience later.  YOU SHOULD EXPECT: Some feelings of bloating in the abdomen. Passage of more gas than usual.  Walking can help get rid of the air that was put into your GI tract during the procedure and reduce the bloating. If you had a lower endoscopy (such as a colonoscopy or flexible sigmoidoscopy) you may notice spotting of blood in your stool or on the toilet paper. If you underwent a bowel prep for your procedure, you may not have a normal bowel movement for a few days.  Please Note:  You might notice some irritation and congestion in your nose or some drainage.  This is from the oxygen used during your procedure.  There is no need for concern and it should clear up in a day or so.  SYMPTOMS TO REPORT IMMEDIATELY:   Following lower endoscopy (colonoscopy or flexible sigmoidoscopy):  Excessive amounts of blood in the stool  Significant tenderness or worsening of abdominal pains  Swelling of the abdomen that is new, acute  Fever of 100F or higher    For urgent or emergent issues, a gastroenterologist can be reached at any hour by calling [phone].   DIET:  We do recommend a small meal at first, but then you may proceed to your regular diet.  Drink plenty of fluids but you should avoid alcoholic beverages for 24 hours.  ACTIVITY:  You should plan to take it easy for the rest of today and you should NOT DRIVE or use heavy machinery until tomorrow (because of the sedation medicines used during the test).     FOLLOW UP: Our staff will call the number listed on your records the next business day following your procedure to check on you and address any questions or concerns that you may have regarding the information given to you following your procedure. If we do not reach you, we will leave a message.  However, if you are feeling well and you are not experiencing any problems, there is no need to return our call.  We will assume that you have returned to your regular daily activities without incident.  If any biopsies were taken you will be contacted by phone or by letter within the next 1-3 weeks.  Please call us at [phone] if you have not heard about the biopsies in 3 weeks.    SIGNATURES/CONFIDENTIALITY: You and/or your care partner have signed paperwork which will be entered into your electronic medical record.  These signatures attest to the fact that that the information above on your After Visit Summary has been reviewed and is understood.  Full responsibility of the confidentiality of this discharge information lies with you and/or your care-partner.   Resume medications. Information given on polyps. 

## 2016-04-29 NOTE — Op Note (Signed)
[name] Patient Name: [name] Procedure Date: 04/29/2016 1:19 PM MRN: [id number] Endoscopist: Mallie Mussel L. Loletha Carrow , MD Age: 51 Referring MD:  Date of Birth: 07/26/1965 Gender: Female Account #: [id number] Procedure:                Colonoscopy Indications:              Screening for colorectal malignant neoplasm, This                            is the patient's first colonoscopy Medicines:                Monitored Anesthesia Care Procedure:                Pre-Anesthesia Assessment:                           - Prior to the procedure, a History and Physical                            was performed, and patient medications and                            allergies were reviewed. The patient's tolerance of                            previous anesthesia was also reviewed. The risks                            and benefits of the procedure and the sedation                            options and risks were discussed with the patient.                            All questions were answered, and informed consent                            was obtained. Anticoagulants: The patient has taken                            aspirin. It was decided not to withhold this                            medication prior to the procedure. ASA Grade                            Assessment: II - A patient with mild systemic                            disease. After reviewing the risks and benefits,                            the patient was deemed in satisfactory condition to  undergo the procedure.                           After obtaining informed consent, the colonoscope                            was passed under direct vision. Throughout the                            procedure, the patient's blood pressure, pulse, and                            oxygen saturations were monitored continuously. The                            Model PCF-[id number]) scope was introduced                             through the anus and advanced to the the cecum,                            identified by appendiceal orifice and ileocecal                            valve. The colonoscopy was performed without                            difficulty. The patient tolerated the procedure                            well. The quality of the bowel preparation was                            excellent. The ileocecal valve, appendiceal                            orifice, and rectum were photographed. The quality                            of the bowel preparation was evaluated using the                            BBPS [facility] Bowel Preparation Scale) with scores                            of: Right Colon = 3, Transverse Colon = 3 and Left                            Colon = 3 (entire mucosa seen well with no residual                            staining, small fragments of stool or opaque  liquid). The total BBPS score equals 9. The bowel                            preparation used was SUPREP. Scope In: 1:23:25 PM Scope Out: 1:36:49 PM Scope Withdrawal Time: 0 hours 11 minutes 1 second  Total Procedure Duration: 0 hours 13 minutes 24 seconds  Findings:                 The perianal and digital rectal examinations were                            normal.                           Two sessile polyps were found in the hepatic                            flexure and cecum. The polyps were 2 mm in size.                            These polyps were removed with a cold biopsy                            forceps. Resection and retrieval were complete.                           The exam was otherwise without abnormality on                            direct and retroflexion views. Complications:            No immediate complications. Estimated Blood Loss:     Estimated blood loss: none. Impression:               - Two 2 mm polyps at the hepatic flexure and in the                             cecum, removed with a cold biopsy forceps. Resected                            and retrieved.                           - The examination was otherwise normal on direct                            and retroflexion views. Recommendation:           - Patient has a contact number available for                            emergencies. The signs and symptoms of potential                            delayed complications were discussed with the  patient. Return to normal activities tomorrow.                            Written discharge instructions were provided to the                            patient.                           - Resume previous diet.                           - Continue present medications.                           - Await pathology results.                           - Repeat colonoscopy is recommended for                            surveillance. The colonoscopy date will be                            determined after pathology results from today's                            exam become available for review. [name] L. Loletha Carrow, MD 04/29/2016 1:40:57 PM This report has been signed electronically.

## 2016-04-29 NOTE — Progress Notes (Signed)
Called to room to assist during endoscopic procedure.  Patient ID and intended procedure confirmed with present staff. Received instructions for my participation in the procedure from the performing physician.  

## 2016-04-29 NOTE — Progress Notes (Signed)
A and O x3. Report to RN. Tolerated MAC anesthesia well.

## 2016-04-30 ENCOUNTER — Telehealth: Payer: Self-pay | Admitting: *Deleted

## 2016-04-30 NOTE — Telephone Encounter (Signed)
  Follow up Call-  Call back number 04/29/2016  Post procedure Call Back phone  # [phone]  Permission to leave phone message Yes  Some recent data might be hidden     Patient questions:  Do you have a fever, pain , or abdominal swelling? No. Pain Score  0 *  Have you tolerated food without any problems? Yes.    Have you been able to return to your normal activities? Yes.    Do you have any questions about your discharge instructions: Diet   No. Medications  No. Follow up visit  No.  Do you have questions or concerns about your Care? No.  Actions: * If pain score is 4 or above: No action needed, pain <4.

## 2016-05-05 ENCOUNTER — Encounter: Payer: Self-pay | Admitting: Gastroenterology

## 2016-05-13 ENCOUNTER — Encounter: Payer: Self-pay | Admitting: Gastroenterology

## 2016-06-16 DIAGNOSIS — E119 Type 2 diabetes mellitus without complications: Secondary | ICD-10-CM | POA: Insufficient documentation

## 2016-07-15 DIAGNOSIS — R1011 Right upper quadrant pain: Secondary | ICD-10-CM

## 2016-07-15 HISTORY — DX: Right upper quadrant pain: R10.11

## 2016-12-19 DIAGNOSIS — Z124 Encounter for screening for malignant neoplasm of cervix: Secondary | ICD-10-CM

## 2016-12-19 HISTORY — DX: Encounter for screening for malignant neoplasm of cervix: Z12.4

## 2017-06-29 DIAGNOSIS — N6320 Unspecified lump in the left breast, unspecified quadrant: Secondary | ICD-10-CM

## 2017-06-29 DIAGNOSIS — Z9189 Other specified personal risk factors, not elsewhere classified: Secondary | ICD-10-CM | POA: Insufficient documentation

## 2017-06-29 HISTORY — DX: Unspecified lump in the left breast, unspecified quadrant: N63.20

## 2017-07-12 DIAGNOSIS — Z1379 Encounter for other screening for genetic and chromosomal anomalies: Secondary | ICD-10-CM

## 2017-07-12 HISTORY — DX: Encounter for other screening for genetic and chromosomal anomalies: Z13.79

## 2017-11-10 DIAGNOSIS — R0600 Dyspnea, unspecified: Secondary | ICD-10-CM | POA: Diagnosis not present

## 2017-11-22 DIAGNOSIS — R0600 Dyspnea, unspecified: Secondary | ICD-10-CM | POA: Insufficient documentation

## 2017-11-22 DIAGNOSIS — R0609 Other forms of dyspnea: Secondary | ICD-10-CM | POA: Insufficient documentation

## 2017-11-22 HISTORY — DX: Other forms of dyspnea: R06.09

## 2017-12-31 DIAGNOSIS — M25562 Pain in left knee: Secondary | ICD-10-CM

## 2017-12-31 HISTORY — DX: Pain in left knee: M25.562

## 2018-01-31 NOTE — H&P (Signed)
TOTAL KNEE ADMISSION H&P  Patient is being admitted for left total knee arthroplasty.  Subjective:  Chief Complaint:   Left knee primary OA / pain  HPI: [name], 52 y.o. female, has a history of pain and functional disability in the left knee due to arthritis and has failed non-surgical conservative treatments for greater than 12 weeks to include NSAID's and/or analgesics, corticosteriod injections, viscosupplementation injections and activity modification.  Onset of symptoms was gradual, starting 4+ years ago with gradually worsening course since that time. The patient noted prior procedures on the knee to include  arthroscopy and menisectomy on the left knee(s).  Patient currently rates pain in the left knee(s) at 10 out of 10 with activity. Patient has night pain, worsening of pain with activity and weight bearing, pain that interferes with activities of daily living, pain with passive range of motion, crepitus and joint swelling.  Patient has evidence of periarticular osteophytes and joint space narrowing by imaging studies. There is no active infection.  Risks, benefits and expectations were discussed with the patient.  Risks including but not limited to the risk of anesthesia, blood clots, nerve damage, blood vessel damage, failure of the prosthesis, infection and up to and including death.  Patient understand the risks, benefits and expectations and wishes to proceed with surgery.   PCP: Bernerd Limbo, MD  D/C Plans:       Home   Post-op Meds:       No Rx given  Tranexamic Acid:      To be given - IV   Decadron:      Is to be given  FYI:      ASA  Dilaudid (IV and PO)  DME:   Equipment arranged  PT:   Virtual   Patient Active Problem List   Diagnosis Date Noted  . Osteoarthritis of right knee 02/06/2013  . Insomnia 02/06/2013  . Anxiety and depression 02/06/2013  . Unspecified constipation 02/06/2013  . GERD (gastroesophageal reflux disease) 02/06/2013  . Anemia, iron  deficiency 02/06/2013  . Expected blood loss anemia 02/01/2013  . Obese 02/01/2013  . S/P right TKA 01/31/2013  . Headache(784.0) 11/28/2012  . Stroke-like symptoms 11/26/2012  . CHEST PAIN UNSPECIFIED 03/06/2009  . DEPRESSION 03/05/2009  . HYPERTENSION 03/05/2009  . FATTY LIVER DISEASE 03/05/2009   Past Medical History:  Diagnosis Date  . Arthritis    osteoarthritis-knees  . Asthma   . 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 GERD (gastroesophageal reflux disease)   . Headache(784.0)    occ. with allergies/sinus issues  . History of MRSA infection 2007  . Hypertension    no problems since weight loss -'09  . Multiple food allergies    Latex allergy-respiratory  . Obesity   . Tendonitis     Past Surgical History:  Procedure Laterality Date  . ABDOMINAL HYSTERECTOMY    . BOWEL RESECTION    . CESAREAN SECTION     x4  . KNEE SURGERY     Right knee scope 8'12  . TOTAL KNEE ARTHROPLASTY Right 01/31/2013   Procedure: RIGHT TOTAL KNEE ARTHROPLASTY;  Surgeon: [name], MD;  Location: [facility];  Service: Orthopedics;  Laterality: Right;    Current Facility-Administered Medications  Medication Dose Route Frequency Provider Last Rate Last Dose  . 0.9 %  sodium chloride infusion  500 mL Intravenous Continuous Danis, Kirke Corin, MD  Current Outpatient Medications  Medication Sig Dispense Refill Last Dose  . albuterol (PROVENTIL HFA;VENTOLIN HFA) 108 (90 BASE) MCG/ACT inhaler Inhale 2 puffs into the lungs every 6 (six) hours as needed for wheezing or shortness of breath.   Unknown  . albuterol (PROVENTIL) (5 MG/ML) 0.5% nebulizer solution Take 5 mg by nebulization 2 (two) times daily.    Unknown  . amLODipine (NORVASC) 10 MG tablet Take 10 mg by mouth.   04/28/2016  . aspirin EC 81 MG tablet Take 81 mg by mouth daily.   04/28/2016  . budesonide-formoterol (SYMBICORT) 160-4.5 MCG/ACT inhaler Inhale 2 puffs into  the lungs 2 (two) times daily.    Unknown  . cetirizine (ZYRTEC) 10 MG tablet Take 10 mg by mouth daily.   04/28/2016  . chlorthalidone (HYGROTON) 25 MG tablet Take 25 mg by mouth.   04/28/2016  . cyclobenzaprine (FLEXERIL) 10 MG tablet Take 10 mg by mouth.   04/29/2016  . EPINEPHrine (EPI-PEN) 0.3 mg/0.3 mL DEVI Inject 0.3 mg into the muscle daily as needed. For severe allergic reaction   Not Taking  . fluticasone (FLONASE) 50 MCG/ACT nasal spray Place 2 sprays into the nose daily.   04/28/2016  . ibuprofen (ADVIL,MOTRIN) 800 MG tablet Take 800 mg by mouth 2 (two) times daily as needed for moderate pain.   Not Taking  . montelukast (SINGULAIR) 10 MG tablet Take 10 mg by mouth at bedtime.    04/28/2016  . ranitidine (ZANTAC) 150 MG tablet Take 150 mg by mouth at bedtime.    04/29/2016  . triamcinolone cream (KENALOG) 0.1 % Apply 0.1 application topically daily as needed (eczema).    Not Taking   Allergies  Allergen Reactions  . Banana Shortness Of Breath and Swelling  . Latex Shortness Of Breath  . Onion Shortness Of Breath and Swelling  . Tylenol [Acetaminophen]     Throat swelling   . Codeine Swelling  . Hydrocodone Hives and Swelling  . Penicillins Swelling    Social History   Tobacco Use  . Smoking status: Former Smoker    Last attempt to quit: 01/25/2005    Years since quitting: 13.0  . Smokeless tobacco: Never Used  Substance Use Topics  . Alcohol use: No    Family History  Problem Relation Age of Onset  . Lung cancer Mother   . Diabetes Father   . Colon cancer Neg Hx      Review of Systems  Constitutional: Negative.   HENT: Negative.   Eyes: Negative.   Respiratory: Negative.   Cardiovascular: Negative.   Gastrointestinal: Positive for heartburn.  Genitourinary: Positive for frequency.  Musculoskeletal: Positive for back pain and joint pain.  Skin: Negative.   Neurological: Positive for headaches.  Endo/Heme/Allergies: Negative.   Psychiatric/Behavioral: Positive  for depression. The patient is nervous/anxious and has insomnia.     Objective:  Physical Exam  Constitutional: She is oriented to person, place, and time. She appears well-developed.  HENT:  Head: Normocephalic.  Eyes: Pupils are equal, round, and reactive to light.  Neck: Neck supple. No JVD present. No tracheal deviation present. No thyromegaly present.  Cardiovascular: Normal rate, regular rhythm and intact distal pulses.  Respiratory: Effort normal and breath sounds normal. No respiratory distress. She has no wheezes.  GI: Soft. There is no tenderness. There is no guarding.  Musculoskeletal:       Left knee: She exhibits decreased range of motion, swelling and bony tenderness. She exhibits no ecchymosis, no deformity, no laceration  and no erythema. Tenderness found.  Lymphadenopathy:    She has no cervical adenopathy.  Neurological: She is alert and oriented to person, place, and time.  Skin: Skin is warm and dry.  Psychiatric: She has a normal mood and affect.      Labs:  Estimated body mass index is 39.26 kg/m as calculated from the following:   Height as of 04/29/16: 5' (1.524 m).   Weight as of 04/29/16: 91.2 kg.   Imaging Review Plain radiographs demonstrate severe degenerative joint disease of the left knee(s).  The bone quality appears to be good for age and reported activity level.   Preoperative templating of the joint replacement has been completed, documented, and submitted to the Operating Room personnel in order to optimize intra-operative equipment management.   Anticipated LOS equal to or greater than 2 midnights due to One or more of the following:  - Obesity  - Expected need for hospital services (PT, OT, Nursing) required for safe  discharge  - Active co-morbidities: Diabetes    Assessment/Plan:  End stage arthritis, left knee   The patient history, physical examination, clinical judgment of the provider and imaging studies are consistent with  end stage degenerative joint disease of the left knee and total knee arthroplasty is deemed medically necessary. The treatment options including medical management, injection therapy arthroscopy and arthroplasty were discussed at length. The risks and benefits of total knee arthroplasty were presented and reviewed. The risks due to aseptic loosening, infection, stiffness, patella tracking problems, thromboembolic complications and other imponderables were discussed. The patient acknowledged the explanation, agreed to proceed with the plan and consent was signed. Patient is being admitted for inpatient treatment for surgery, pain control, PT, OT, prophylactic antibiotics, VTE prophylaxis, progressive ambulation and ADL's and discharge planning. The patient is planning to be discharged home.      [place] Pugh Hanifa Antonetti   PA-C  01/31/2018, 11:17 AM

## 2018-02-04 ENCOUNTER — Encounter (HOSPITAL_COMMUNITY): Payer: Self-pay

## 2018-02-08 ENCOUNTER — Inpatient Hospital Stay (HOSPITAL_COMMUNITY): Admission: RE | Admit: 2018-02-08 | Payer: Medicare Other | Source: Ambulatory Visit

## 2018-02-08 ENCOUNTER — Encounter (HOSPITAL_COMMUNITY): Payer: Self-pay

## 2018-02-08 ENCOUNTER — Other Ambulatory Visit: Payer: Self-pay

## 2018-02-08 ENCOUNTER — Encounter (HOSPITAL_COMMUNITY)
Admission: RE | Admit: 2018-02-08 | Discharge: 2018-02-08 | Disposition: A | Discharge status: Home / Self Care | Payer: Medicare Other | Source: Ambulatory Visit | Attending: Orthopedic Surgery | Admitting: Orthopedic Surgery

## 2018-02-08 DIAGNOSIS — Z01818 Encounter for other preprocedural examination: Secondary | ICD-10-CM | POA: Insufficient documentation

## 2018-02-08 DIAGNOSIS — R9431 Abnormal electrocardiogram [ECG] [EKG]: Secondary | ICD-10-CM | POA: Insufficient documentation

## 2018-02-08 DIAGNOSIS — M1712 Unilateral primary osteoarthritis, left knee: Secondary | ICD-10-CM | POA: Insufficient documentation

## 2018-02-08 HISTORY — DX: Type 2 diabetes mellitus without complications: E11.9

## 2018-02-08 HISTORY — DX: Depression, unspecified: F32.A

## 2018-02-08 HISTORY — DX: Benign neoplasm of left breast: D24.2

## 2018-02-08 HISTORY — DX: Anxiety disorder, unspecified: F41.9

## 2018-02-08 HISTORY — DX: Major depressive disorder, single episode, unspecified: F32.9

## 2018-02-08 HISTORY — DX: Vitamin D deficiency, unspecified: E55.9

## 2018-02-08 HISTORY — DX: Personal history of colonic polyps: Z86.010

## 2018-02-08 LAB — SURGICAL PCR SCREEN
MRSA, PCR: NEGATIVE
Staphylococcus aureus: NEGATIVE

## 2018-02-08 LAB — BASIC METABOLIC PANEL
ANION GAP: 10 (ref 5–15)
BUN: 8 mg/dL (ref 6–20)
CHLORIDE: 107 mmol/L (ref 98–111)
CO2: 25 mmol/L (ref 22–32)
Calcium: 9.5 mg/dL (ref 8.9–10.3)
Creatinine, Ser: 0.74 mg/dL (ref 0.44–1.00)
GFR calc non Af Amer: >60 mL/min (ref >60)
GLUCOSE: 143 mg/dL — AB (ref 70–99)
POTASSIUM: 4.6 mmol/L (ref 3.5–5.1)
Sodium: 142 mmol/L (ref 135–145)

## 2018-02-08 LAB — CBC
HCT: 44.8 % (ref 36.0–46.0)
HEMOGLOBIN: 13.7 g/dL (ref 12.0–15.0)
MCH: 29.5 pg (ref 26.0–34.0)
MCHC: 30.6 g/dL (ref 30.0–36.0)
MCV: 96.3 fL (ref 80.0–100.0)
NRBC: 0 % (ref 0.0–0.2)
PLATELETS: 427 10*3/uL — AB (ref 150–400)
RBC: 4.65 MIL/uL (ref 3.87–5.11)
RDW: 13.9 % (ref 11.5–15.5)
WBC: 7.9 10*3/uL (ref 4.0–10.5)

## 2018-02-08 LAB — GLUCOSE, CAPILLARY: GLUCOSE-CAPILLARY: 89 mg/dL (ref 70–99)

## 2018-02-08 LAB — HEMOGLOBIN A1C
HEMOGLOBIN A1C: 6.4 % — AB (ref 4.8–5.6)
Mean Plasma Glucose: 136.98 mg/dL

## 2018-02-08 NOTE — Pre-Procedure Instructions (Signed)
CBC and BMP results 02/08/2018 sent to Dr. Alvan Dame via epic.

## 2018-02-08 NOTE — Patient Instructions (Signed)
Your procedure is scheduled on: Tuesday, Nov. 12, 2019   Surgery Time:  7:15AM-8:25AM   Report to [place]  Entrance    Report to admitting at 5:30 AM   Call this number if you have problems the morning of surgery [phone]   Do not eat food or drink liquids :After Midnight.   Brush your teeth the morning of surgery.   Do NOT smoke after Midnight   Take these medicines the morning of surgery with A SIP OF WATER: Buspirone, Pristiq, Diazepam, Rantidine,    Use Asthma Inhaler and Nasal spray per normal routine   Bring Asthma Inhaler day of surgery  DO NOT TAKE ANY DIABETIC MEDICATIONS DAY OF YOUR SURGERY                               You may not have any metal on your body including hair pins, jewelry, and body piercings             Do not wear make-up, lotions, powders, perfumes/cologne, or deodorant             Do not wear nail polish.  Do not shave  48 hours prior to surgery.              Men may shave face and neck.   Do not bring valuables to the hospital. [place].   Contacts, dentures or bridgework may not be worn into surgery.   Leave suitcase in the car. After surgery it may be brought to your room.    Special Instructions: Bring a copy of your healthcare power of attorney and living will documents         the day of surgery if you haven't scanned them in before.              Please read over the following fact sheets you were given:  [facility] - Preparing for Surgery Before surgery, you can play an important role.  Because skin is not sterile, your skin needs to be as free of germs as possible.  You can reduce the number of germs on your skin by washing with CHG (chlorahexidine gluconate) soap before surgery.  CHG is an antiseptic cleaner which kills germs and bonds with the skin to continue killing germs even after washing. Please DO NOT use if you have an allergy to CHG or antibacterial  soaps.  If your skin becomes reddened/irritated stop using the CHG and inform your nurse when you arrive at Short Stay. Do not shave (including legs and underarms) for at least 48 hours prior to the first CHG shower.  You may shave your face/neck.  Please follow these instructions carefully:  1.  Shower with CHG Soap the night before surgery and the  morning of surgery.  2.  If you choose to wash your hair, wash your hair first as usual with your normal  shampoo.  3.  After you shampoo, rinse your hair and body thoroughly to remove the shampoo.                             4.  Use CHG as you would any other liquid soap.  You can apply chg directly to the skin and wash.  Gently with a scrungie or clean washcloth.  5.  Apply the CHG Soap to your body ONLY FROM THE NECK DOWN.   Do   not use on face/ open                           Wound or open sores. Avoid contact with eyes, ears mouth and   genitals (private parts).                       Wash face,  Genitals (private parts) with your normal soap.             6.  Wash thoroughly, paying special attention to the area where your    surgery  will be performed.  7.  Thoroughly rinse your body with warm water from the neck down.  8.  DO NOT shower/wash with your normal soap after using and rinsing off the CHG Soap.                9.  Pat yourself dry with a clean towel.            10.  Wear clean pajamas.            11.  Place clean sheets on your bed the night of your first shower and do not  sleep with pets. Day of Surgery : Do not apply any lotions/deodorants the morning of surgery.  Please wear clean clothes to the hospital/surgery center.  FAILURE TO FOLLOW THESE INSTRUCTIONS MAY RESULT IN THE CANCELLATION OF YOUR SURGERY  PATIENT SIGNATURE_________________________________  NURSE SIGNATURE__________________________________  ________________________________________________________________________   Adam Phenix  An incentive  spirometer is a tool that can help keep your lungs clear and active. This tool measures how well you are filling your lungs with each breath. Taking long deep breaths may help reverse or decrease the chance of developing breathing (pulmonary) problems (especially infection) following:  A long period of time when you are unable to move or be active. BEFORE THE PROCEDURE   If the spirometer includes an indicator to show your best effort, your nurse or respiratory therapist will set it to a desired goal.  If possible, sit up straight or lean slightly forward. Try not to slouch.  Hold the incentive spirometer in an upright position. INSTRUCTIONS FOR USE  1. Sit on the edge of your bed if possible, or sit up as far as you can in bed or on a chair. 2. Hold the incentive spirometer in an upright position. 3. Breathe out normally. 4. Place the mouthpiece in your mouth and seal your lips tightly around it. 5. Breathe in slowly and as deeply as possible, raising the piston or the ball toward the top of the column. 6. Hold your breath for 3-5 seconds or for as long as possible. Allow the piston or ball to fall to the bottom of the column. 7. Remove the mouthpiece from your mouth and breathe out normally. 8. Rest for a few seconds and repeat Steps 1 through 7 at least 10 times every 1-2 hours when you are awake. Take your time and take a few normal breaths between deep breaths. 9. The spirometer may include an indicator to show your best effort. Use the indicator as a goal to work toward during each repetition. 10. After each set of 10 deep breaths, practice coughing to be sure your lungs are clear. If you have an incision (the  cut made at the time of surgery), support your incision when coughing by placing a pillow or rolled up towels firmly against it. Once you are able to get out of bed, walk around indoors and cough well. You may stop using the incentive spirometer when instructed by your caregiver.   RISKS AND COMPLICATIONS  Take your time so you do not get dizzy or light-headed.  If you are in pain, you may need to take or ask for pain medication before doing incentive spirometry. It is harder to take a deep breath if you are having pain. AFTER USE  Rest and breathe slowly and easily.  It can be helpful to keep track of a log of your progress. Your caregiver can provide you with a simple table to help with this. If you are using the spirometer at home, follow these instructions: [place] IF:   You are having difficultly using the spirometer.  You have trouble using the spirometer as often as instructed.  Your pain medication is not giving enough relief while using the spirometer.  You develop fever of 100.5 F (38.1 C) or higher. SEEK IMMEDIATE MEDICAL CARE IF:   You cough up bloody sputum that had not been present before.  You develop fever of 102 F (38.9 C) or greater.  You develop worsening pain at or near the incision site. MAKE SURE YOU:   Understand these instructions.  Will watch your condition.  Will get help right away if you are not doing well or get worse. Document Released: 08/03/2006 Document Revised: 06/15/2011 Document Reviewed: 10/04/2006 Exit[employer] Patient Information 2014 Exit[employer], [place].   ________________________________________________________________________  WHAT IS A BLOOD TRANSFUSION? Blood Transfusion Information  A transfusion is the replacement of blood or some of its parts. Blood is made up of multiple cells which provide different functions.  Red blood cells carry oxygen and are used for blood loss replacement.  White blood cells fight against infection.  Platelets control bleeding.  Plasma helps clot blood.  Other blood products are available for specialized needs, such as hemophilia or other clotting disorders. BEFORE THE TRANSFUSION  Who gives blood for transfusions?   Healthy volunteers who are fully evaluated  to make sure their blood is safe. This is blood bank blood. Transfusion therapy is the safest it has ever been in the practice of medicine. Before blood is taken from a donor, a complete history is taken to make sure that person has no history of diseases nor engages in risky social behavior (examples are intravenous drug use or sexual activity with multiple partners). The donor's travel history is screened to minimize risk of transmitting infections, such as malaria. The donated blood is tested for signs of infectious diseases, such as HIV and hepatitis. The blood is then tested to be sure it is compatible with you in order to minimize the chance of a transfusion reaction. If you or a relative donates blood, this is often done in anticipation of surgery and is not appropriate for emergency situations. It takes many days to process the donated blood. RISKS AND COMPLICATIONS Although transfusion therapy is very safe and saves many lives, the main dangers of transfusion include:   Getting an infectious disease.  Developing a transfusion reaction. This is an allergic reaction to something in the blood you were given. Every precaution is taken to prevent this. The decision to have a blood transfusion has been considered carefully by your caregiver before blood is given. Blood is not given unless the  benefits outweigh the risks. AFTER THE TRANSFUSION  Right after receiving a blood transfusion, you will usually feel much better and more energetic. This is especially true if your red blood cells have gotten low (anemic). The transfusion raises the level of the red blood cells which carry oxygen, and this usually causes an energy increase.  The nurse administering the transfusion will monitor you carefully for complications. HOME CARE INSTRUCTIONS  No special instructions are needed after a transfusion. You may find your energy is better. Speak with your caregiver about any limitations on activity for  underlying diseases you may have. SEEK MEDICAL CARE IF:   Your condition is not improving after your transfusion.  You develop redness or irritation at the intravenous (IV) site. SEEK IMMEDIATE MEDICAL CARE IF:  Any of the following symptoms occur over the next 12 hours:  Shaking chills.  You have a temperature by mouth above 102 F (38.9 C), not controlled by medicine.  Chest, back, or muscle pain.  People around you feel you are not acting correctly or are confused.  Shortness of breath or difficulty breathing.  Dizziness and fainting.  You get a rash or develop hives.  You have a decrease in urine output.  Your urine turns a dark color or changes to pink, red, or brown. Any of the following symptoms occur over the next 10 days:  You have a temperature by mouth above 102 F (38.9 C), not controlled by medicine.  Shortness of breath.  Weakness after normal activity.  The white part of the eye turns yellow (jaundice).  You have a decrease in the amount of urine or are urinating less often.  Your urine turns a dark color or changes to pink, red, or brown. Document Released: 03/20/2000 Document Revised: 06/15/2011 Document Reviewed: 11/07/2007 [facility] Patient Information 2014 [place], [place].  _______________________________________________________________________

## 2018-02-09 NOTE — Pre-Procedure Instructions (Signed)
Hgb A1C results 02/08/2018 sent to Dr. Alvan Dame via epic.

## 2018-02-14 MED ORDER — GENTAMICIN SULFATE 40 MG/ML IJ SOLN
5.0000 mg/kg | INTRAVENOUS | Status: AC
  Administered 2018-02-15: 330 mg via INTRAVENOUS
  Filled 2018-02-14: qty 8.25

## 2018-02-14 NOTE — Anesthesia Preprocedure Evaluation (Addendum)
Anesthesia Evaluation  Patient identified by MRN, date of birth, ID band Patient awake    Reviewed: Allergy & Precautions, NPO status , Patient's Chart, lab work & pertinent test results  Airway Mallampati: III  TM Distance: >3 FB Neck ROM: Full    Dental  (+) Edentulous Upper, Edentulous Lower   Pulmonary asthma , former smoker,    Pulmonary exam normal breath sounds clear to auscultation       Cardiovascular hypertension, Pt. on medications Normal cardiovascular exam Rhythm:Regular Rate:Normal  ECG, rate 96. Normal sinus rhythm Left axis deviation   Neuro/Psych  Headaches, PSYCHIATRIC DISORDERS Anxiety Depression    GI/Hepatic Neg liver ROS, GERD  Medicated and Controlled,  Endo/Other  diabetes, Oral Hypoglycemic AgentsMorbid obesity  Renal/GU negative Renal ROS     Musculoskeletal negative musculoskeletal ROS (+)   Abdominal (+) + obese,   Peds  Hematology HLD   Anesthesia Other Findings Left knee osteoarthritis  Reproductive/Obstetrics                            Anesthesia Physical Anesthesia Plan  ASA: III  Anesthesia Plan: General and Regional   Post-op Pain Management: GA combined w/ Regional for post-op pain   Induction: Intravenous  PONV Risk Score and Plan: 3 and Midazolam, Dexamethasone, Ondansetron and Treatment may vary due to age or medical condition  Airway Management Planned: Oral ETT  Additional Equipment:   Intra-op Plan:   Post-operative Plan: Extubation in OR  Informed Consent: I have reviewed the patients History and Physical, chart, labs and discussed the procedure including the risks, benefits and alternatives for the proposed anesthesia with the patient or authorized representative who has indicated his/her understanding and acceptance.   Dental advisory given  Plan Discussed with: CRNA  Anesthesia Plan Comments:        Anesthesia Quick  Evaluation

## 2018-02-15 ENCOUNTER — Inpatient Hospital Stay (HOSPITAL_COMMUNITY)
Admission: RE | Admit: 2018-02-15 | Discharge: 2018-02-17 | DRG: 470 | Disposition: A | Discharge status: Home / Self Care | Payer: Medicare Other | Attending: Orthopedic Surgery | Admitting: Orthopedic Surgery

## 2018-02-15 ENCOUNTER — Inpatient Hospital Stay (HOSPITAL_COMMUNITY): Payer: Medicare Other | Admitting: Anesthesiology

## 2018-02-15 ENCOUNTER — Other Ambulatory Visit: Payer: Self-pay

## 2018-02-15 ENCOUNTER — Encounter (HOSPITAL_COMMUNITY): Payer: Self-pay | Admitting: General Practice

## 2018-02-15 ENCOUNTER — Encounter (HOSPITAL_COMMUNITY)
Admission: RE | Disposition: A | Discharge status: Home / Self Care | Payer: Self-pay | Source: Home / Self Care | Attending: Orthopedic Surgery

## 2018-02-15 DIAGNOSIS — Z91040 Latex allergy status: Secondary | ICD-10-CM

## 2018-02-15 DIAGNOSIS — Z7951 Long term (current) use of inhaled steroids: Secondary | ICD-10-CM

## 2018-02-15 DIAGNOSIS — Z8614 Personal history of Methicillin resistant Staphylococcus aureus infection: Secondary | ICD-10-CM

## 2018-02-15 DIAGNOSIS — Z6839 Body mass index (BMI) 39.0-39.9, adult: Secondary | ICD-10-CM

## 2018-02-15 DIAGNOSIS — M659 Synovitis and tenosynovitis, unspecified: Secondary | ICD-10-CM | POA: Diagnosis present

## 2018-02-15 DIAGNOSIS — Z87891 Personal history of nicotine dependence: Secondary | ICD-10-CM

## 2018-02-15 DIAGNOSIS — Z96651 Presence of right artificial knee joint: Secondary | ICD-10-CM | POA: Diagnosis present

## 2018-02-15 DIAGNOSIS — I10 Essential (primary) hypertension: Secondary | ICD-10-CM | POA: Diagnosis present

## 2018-02-15 DIAGNOSIS — Z886 Allergy status to analgesic agent status: Secondary | ICD-10-CM

## 2018-02-15 DIAGNOSIS — Z96659 Presence of unspecified artificial knee joint: Secondary | ICD-10-CM

## 2018-02-15 DIAGNOSIS — D509 Iron deficiency anemia, unspecified: Secondary | ICD-10-CM | POA: Diagnosis present

## 2018-02-15 DIAGNOSIS — M1712 Unilateral primary osteoarthritis, left knee: Principal | ICD-10-CM | POA: Diagnosis present

## 2018-02-15 DIAGNOSIS — Z91018 Allergy to other foods: Secondary | ICD-10-CM

## 2018-02-15 DIAGNOSIS — Z833 Family history of diabetes mellitus: Secondary | ICD-10-CM

## 2018-02-15 DIAGNOSIS — E669 Obesity, unspecified: Secondary | ICD-10-CM | POA: Diagnosis present

## 2018-02-15 DIAGNOSIS — J45909 Unspecified asthma, uncomplicated: Secondary | ICD-10-CM | POA: Diagnosis present

## 2018-02-15 DIAGNOSIS — Z791 Long term (current) use of non-steroidal anti-inflammatories (NSAID): Secondary | ICD-10-CM

## 2018-02-15 DIAGNOSIS — Z885 Allergy status to narcotic agent status: Secondary | ICD-10-CM

## 2018-02-15 DIAGNOSIS — Z880 Allergy status to penicillin: Secondary | ICD-10-CM

## 2018-02-15 DIAGNOSIS — Z79899 Other long term (current) drug therapy: Secondary | ICD-10-CM

## 2018-02-15 DIAGNOSIS — E119 Type 2 diabetes mellitus without complications: Secondary | ICD-10-CM | POA: Diagnosis present

## 2018-02-15 DIAGNOSIS — Z90710 Acquired absence of both cervix and uterus: Secondary | ICD-10-CM

## 2018-02-15 DIAGNOSIS — Z801 Family history of malignant neoplasm of trachea, bronchus and lung: Secondary | ICD-10-CM

## 2018-02-15 DIAGNOSIS — Z96652 Presence of left artificial knee joint: Secondary | ICD-10-CM

## 2018-02-15 DIAGNOSIS — Z7982 Long term (current) use of aspirin: Secondary | ICD-10-CM

## 2018-02-15 DIAGNOSIS — M25762 Osteophyte, left knee: Secondary | ICD-10-CM | POA: Diagnosis present

## 2018-02-15 HISTORY — PX: TOTAL KNEE ARTHROPLASTY: SHX125

## 2018-02-15 LAB — TYPE AND SCREEN
ABO/RH(D): B POS
ANTIBODY SCREEN: NEGATIVE

## 2018-02-15 LAB — GLUCOSE, CAPILLARY
GLUCOSE-CAPILLARY: 175 mg/dL — AB (ref 70–99)
GLUCOSE-CAPILLARY: 179 mg/dL — AB (ref 70–99)
Glucose-Capillary: 110 mg/dL — ABNORMAL HIGH (ref 70–99)
Glucose-Capillary: 194 mg/dL — ABNORMAL HIGH (ref 70–99)
Glucose-Capillary: 203 mg/dL — ABNORMAL HIGH (ref 70–99)

## 2018-02-15 SURGERY — ARTHROPLASTY, KNEE, TOTAL
Anesthesia: Regional | Site: Knee | Laterality: Left

## 2018-02-15 MED ORDER — ONDANSETRON HCL 4 MG/2ML IJ SOLN
INTRAMUSCULAR | Status: DC | PRN
  Administered 2018-02-15: 4 mg via INTRAVENOUS

## 2018-02-15 MED ORDER — VANCOMYCIN HCL IN DEXTROSE 1-5 GM/200ML-% IV SOLN
1000.0000 mg | Freq: Two times a day (BID) | INTRAVENOUS | Status: AC
  Administered 2018-02-15: 1000 mg via INTRAVENOUS
  Filled 2018-02-15 (×2): qty 200

## 2018-02-15 MED ORDER — 0.9 % SODIUM CHLORIDE (POUR BTL) OPTIME
TOPICAL | Status: DC | PRN
  Administered 2018-02-15: 1000 mL

## 2018-02-15 MED ORDER — MAGNESIUM CITRATE PO SOLN: 1.0000 | Freq: Once | ORAL | Status: DC | PRN

## 2018-02-15 MED ORDER — MIDAZOLAM HCL 2 MG/2ML IJ SOLN
INTRAMUSCULAR | Status: DC | PRN
  Administered 2018-02-15: 2 mg via INTRAVENOUS

## 2018-02-15 MED ORDER — METHOCARBAMOL 500 MG PO TABS: 500.0000 mg | ORAL_TABLET | Freq: Four times a day (QID) | ORAL | 0 refills | Status: DC | PRN

## 2018-02-15 MED ORDER — HYDROMORPHONE HCL 1 MG/ML IJ SOLN
0.2500 mg | INTRAMUSCULAR | Status: DC | PRN
  Administered 2018-02-15 (×2): 0.5 mg via INTRAVENOUS

## 2018-02-15 MED ORDER — FENTANYL CITRATE (PF) 100 MCG/2ML IJ SOLN
INTRAMUSCULAR | Status: DC | PRN
  Administered 2018-02-15: 100 ug via INTRAVENOUS

## 2018-02-15 MED ORDER — FENTANYL CITRATE (PF) 250 MCG/5ML IJ SOLN
INTRAMUSCULAR | Status: DC | PRN
  Administered 2018-02-15: 100 ug via INTRAVENOUS
  Administered 2018-02-15 (×3): 50 ug via INTRAVENOUS

## 2018-02-15 MED ORDER — MENTHOL 3 MG MT LOZG: 1.0000 | LOZENGE | OROMUCOSAL | Status: DC | PRN

## 2018-02-15 MED ORDER — DEXAMETHASONE SODIUM PHOSPHATE 10 MG/ML IJ SOLN
10.0000 mg | Freq: Once | INTRAMUSCULAR | Status: AC
  Administered 2018-02-15: 8 mg via INTRAVENOUS

## 2018-02-15 MED ORDER — PROPOFOL 10 MG/ML IV BOLUS
INTRAVENOUS | Status: DC | PRN
  Administered 2018-02-15: 200 mg via INTRAVENOUS

## 2018-02-15 MED ORDER — BUSPIRONE HCL 5 MG PO TABS
15.0000 mg | ORAL_TABLET | Freq: Three times a day (TID) | ORAL | Status: DC
  Administered 2018-02-15 – 2018-02-17 (×6): 15 mg via ORAL
  Filled 2018-02-15 (×6): qty 3

## 2018-02-15 MED ORDER — TRANEXAMIC ACID-NACL 1000-0.7 MG/100ML-% IV SOLN
1000.0000 mg | INTRAVENOUS | Status: AC
  Administered 2018-02-15: 1000 mg via INTRAVENOUS
  Filled 2018-02-15: qty 100

## 2018-02-15 MED ORDER — ALUM & MAG HYDROXIDE-SIMETH 200-200-20 MG/5ML PO SUSP: 15.0000 mL | ORAL | Status: DC | PRN

## 2018-02-15 MED ORDER — PROMETHAZINE HCL 25 MG/ML IJ SOLN: 6.2500 mg | INTRAMUSCULAR | Status: DC | PRN

## 2018-02-15 MED ORDER — FENTANYL CITRATE (PF) 100 MCG/2ML IJ SOLN
INTRAMUSCULAR | Status: AC
  Filled 2018-02-15: qty 2

## 2018-02-15 MED ORDER — TRANEXAMIC ACID-NACL 1000-0.7 MG/100ML-% IV SOLN
1000.0000 mg | Freq: Once | INTRAVENOUS | Status: AC
  Administered 2018-02-15: 1000 mg via INTRAVENOUS
  Filled 2018-02-15: qty 100

## 2018-02-15 MED ORDER — PHENYLEPHRINE 40 MCG/ML (10ML) SYRINGE FOR IV PUSH (FOR BLOOD PRESSURE SUPPORT)
PREFILLED_SYRINGE | INTRAVENOUS | Status: DC | PRN
  Administered 2018-02-15 (×3): 80 ug via INTRAVENOUS
  Administered 2018-02-15: 40 ug via INTRAVENOUS

## 2018-02-15 MED ORDER — METHOCARBAMOL 500 MG IVPB - SIMPLE MED
INTRAVENOUS | Status: AC
  Filled 2018-02-15: qty 50

## 2018-02-15 MED ORDER — VENLAFAXINE HCL ER 150 MG PO CP24
150.0000 mg | ORAL_CAPSULE | Freq: Every day | ORAL | Status: DC
  Administered 2018-02-16 – 2018-02-17 (×2): 150 mg via ORAL
  Filled 2018-02-15 (×2): qty 1

## 2018-02-15 MED ORDER — SUGAMMADEX SODIUM 200 MG/2ML IV SOLN
INTRAVENOUS | Status: DC | PRN
  Administered 2018-02-15: 200 mg via INTRAVENOUS

## 2018-02-15 MED ORDER — BUPIVACAINE HCL (PF) 0.25 % IJ SOLN
INTRAMUSCULAR | Status: DC | PRN
  Administered 2018-02-15: 30 mL

## 2018-02-15 MED ORDER — SUCCINYLCHOLINE CHLORIDE 200 MG/10ML IV SOSY
PREFILLED_SYRINGE | INTRAVENOUS | Status: DC | PRN
  Administered 2018-02-15: 80 mg via INTRAVENOUS

## 2018-02-15 MED ORDER — LABETALOL HCL 5 MG/ML IV SOLN
INTRAVENOUS | Status: DC | PRN
  Administered 2018-02-15 (×2): 2.5 mg via INTRAVENOUS

## 2018-02-15 MED ORDER — VANCOMYCIN HCL 1000 MG IV SOLR
INTRAVENOUS | Status: DC | PRN
  Administered 2018-02-15: 1000 mg via TOPICAL
  Administered 2018-02-15: 1000 mg

## 2018-02-15 MED ORDER — INSULIN ASPART 100 UNIT/ML ~~LOC~~ SOLN
0.0000 [IU] | Freq: Three times a day (TID) | SUBCUTANEOUS | Status: DC
  Administered 2018-02-15 (×2): 3 [IU] via SUBCUTANEOUS
  Administered 2018-02-16: 2 [IU] via SUBCUTANEOUS
  Administered 2018-02-16: 5 [IU] via SUBCUTANEOUS
  Administered 2018-02-16: 2 [IU] via SUBCUTANEOUS
  Filled 2018-02-15: qty 1

## 2018-02-15 MED ORDER — SODIUM CHLORIDE 0.9 % IV SOLN
INTRAVENOUS | Status: DC
  Administered 2018-02-15: 12:00:00 via INTRAVENOUS

## 2018-02-15 MED ORDER — HYDROMORPHONE HCL 2 MG PO TABS
4.0000 mg | ORAL_TABLET | ORAL | Status: DC | PRN
  Administered 2018-02-15 – 2018-02-17 (×10): 4 mg via ORAL
  Filled 2018-02-15 (×11): qty 2

## 2018-02-15 MED ORDER — HYDROMORPHONE HCL 2 MG PO TABS
2.0000 mg | ORAL_TABLET | ORAL | Status: DC | PRN
  Administered 2018-02-15 (×2): 2 mg via ORAL
  Filled 2018-02-15 (×2): qty 1

## 2018-02-15 MED ORDER — ONDANSETRON HCL 4 MG PO TABS
4.0000 mg | ORAL_TABLET | Freq: Four times a day (QID) | ORAL | Status: DC | PRN
  Administered 2018-02-17: 4 mg via ORAL
  Filled 2018-02-15: qty 1

## 2018-02-15 MED ORDER — DOCUSATE SODIUM 100 MG PO CAPS: 100.0000 mg | ORAL_CAPSULE | Freq: Two times a day (BID) | ORAL | 0 refills | Status: DC

## 2018-02-15 MED ORDER — FERROUS SULFATE 325 (65 FE) MG PO TABS: 325.0000 mg | ORAL_TABLET | Freq: Three times a day (TID) | ORAL | 3 refills | Status: DC

## 2018-02-15 MED ORDER — HYDROMORPHONE HCL 1 MG/ML IJ SOLN
0.5000 mg | INTRAMUSCULAR | Status: DC | PRN
  Administered 2018-02-15 – 2018-02-16 (×2): 1 mg via INTRAVENOUS
  Filled 2018-02-15 (×2): qty 1

## 2018-02-15 MED ORDER — PROPOFOL 10 MG/ML IV BOLUS
INTRAVENOUS | Status: AC
  Filled 2018-02-15: qty 60

## 2018-02-15 MED ORDER — KETOROLAC TROMETHAMINE 30 MG/ML IJ SOLN
INTRAMUSCULAR | Status: DC | PRN
  Administered 2018-02-15: 30 mg

## 2018-02-15 MED ORDER — VANCOMYCIN HCL 1000 MG IV SOLR
INTRAVENOUS | Status: AC
  Filled 2018-02-15: qty 2000

## 2018-02-15 MED ORDER — FERROUS SULFATE 325 (65 FE) MG PO TABS
325.0000 mg | ORAL_TABLET | Freq: Two times a day (BID) | ORAL | Status: DC
  Administered 2018-02-16 – 2018-02-17 (×3): 325 mg via ORAL
  Filled 2018-02-15 (×3): qty 1

## 2018-02-15 MED ORDER — FENTANYL CITRATE (PF) 100 MCG/2ML IJ SOLN
25.0000 ug | INTRAMUSCULAR | Status: DC | PRN
  Administered 2018-02-15 (×2): 25 ug via INTRAVENOUS

## 2018-02-15 MED ORDER — CELECOXIB 200 MG PO CAPS
200.0000 mg | ORAL_CAPSULE | Freq: Two times a day (BID) | ORAL | Status: DC
  Administered 2018-02-15 – 2018-02-17 (×4): 200 mg via ORAL
  Filled 2018-02-15 (×4): qty 1

## 2018-02-15 MED ORDER — FENTANYL CITRATE (PF) 250 MCG/5ML IJ SOLN
INTRAMUSCULAR | Status: AC
  Filled 2018-02-15: qty 5

## 2018-02-15 MED ORDER — ROPINIROLE HCL 1 MG PO TABS
1.0000 mg | ORAL_TABLET | Freq: Every day | ORAL | Status: DC
  Administered 2018-02-15 – 2018-02-16 (×2): 1 mg via ORAL
  Filled 2018-02-15 (×2): qty 1

## 2018-02-15 MED ORDER — ALBUTEROL SULFATE HFA 108 (90 BASE) MCG/ACT IN AERS
INHALATION_SPRAY | RESPIRATORY_TRACT | Status: DC | PRN
  Administered 2018-02-15 (×2): 3 via RESPIRATORY_TRACT

## 2018-02-15 MED ORDER — FLUTICASONE PROPIONATE 50 MCG/ACT NA SUSP: 2.0000 | Freq: Every day | NASAL | Status: DC | PRN

## 2018-02-15 MED ORDER — MIDAZOLAM HCL 2 MG/2ML IJ SOLN
INTRAMUSCULAR | Status: AC
  Filled 2018-02-15: qty 2

## 2018-02-15 MED ORDER — LIDOCAINE 2% (20 MG/ML) 5 ML SYRINGE
INTRAMUSCULAR | Status: DC | PRN
  Administered 2018-02-15: 40 mg via INTRAVENOUS

## 2018-02-15 MED ORDER — POLYETHYLENE GLYCOL 3350 17 G PO PACK
17.0000 g | PACK | Freq: Two times a day (BID) | ORAL | Status: DC
  Administered 2018-02-15 – 2018-02-17 (×4): 17 g via ORAL
  Filled 2018-02-15 (×4): qty 1

## 2018-02-15 MED ORDER — METOCLOPRAMIDE HCL 5 MG PO TABS: 5.0000 mg | ORAL_TABLET | Freq: Three times a day (TID) | ORAL | Status: DC | PRN

## 2018-02-15 MED ORDER — DIAZEPAM 5 MG PO TABS
10.0000 mg | ORAL_TABLET | Freq: Four times a day (QID) | ORAL | Status: DC
  Administered 2018-02-15 – 2018-02-17 (×8): 10 mg via ORAL
  Filled 2018-02-15 (×8): qty 2

## 2018-02-15 MED ORDER — SODIUM CHLORIDE (PF) 0.9 % IJ SOLN
INTRAMUSCULAR | Status: AC
  Filled 2018-02-15: qty 50

## 2018-02-15 MED ORDER — PROPOFOL 10 MG/ML IV BOLUS
INTRAVENOUS | Status: AC
  Filled 2018-02-15: qty 20

## 2018-02-15 MED ORDER — QUETIAPINE FUMARATE 50 MG PO TABS
100.0000 mg | ORAL_TABLET | Freq: Every day | ORAL | Status: DC
  Administered 2018-02-15 – 2018-02-16 (×2): 100 mg via ORAL
  Filled 2018-02-15 (×2): qty 2

## 2018-02-15 MED ORDER — SODIUM CHLORIDE 0.9 % IR SOLN
Status: DC | PRN
  Administered 2018-02-15: 1000 mL

## 2018-02-15 MED ORDER — ROPIVACAINE HCL 5 MG/ML IJ SOLN
INTRAMUSCULAR | Status: DC | PRN
  Administered 2018-02-15: 22 mL via PERINEURAL

## 2018-02-15 MED ORDER — METFORMIN HCL 500 MG PO TABS
1000.0000 mg | ORAL_TABLET | Freq: Two times a day (BID) | ORAL | Status: DC
  Administered 2018-02-15 – 2018-02-17 (×4): 1000 mg via ORAL
  Filled 2018-02-15 (×4): qty 2

## 2018-02-15 MED ORDER — LIP MEDEX EX OINT
TOPICAL_OINTMENT | CUTANEOUS | Status: AC
  Filled 2018-02-15: qty 7

## 2018-02-15 MED ORDER — HYDROMORPHONE HCL 2 MG PO TABS: 2.0000 mg | ORAL_TABLET | ORAL | 0 refills | Status: DC | PRN

## 2018-02-15 MED ORDER — VANCOMYCIN HCL IN DEXTROSE 1-5 GM/200ML-% IV SOLN
1000.0000 mg | INTRAVENOUS | Status: AC
  Administered 2018-02-15: 1000 mg via INTRAVENOUS
  Filled 2018-02-15: qty 200

## 2018-02-15 MED ORDER — ASPIRIN 81 MG PO CHEW: 81.0000 mg | CHEWABLE_TABLET | Freq: Two times a day (BID) | ORAL | 0 refills | Status: AC

## 2018-02-15 MED ORDER — METHOCARBAMOL 500 MG PO TABS
500.0000 mg | ORAL_TABLET | Freq: Four times a day (QID) | ORAL | Status: DC | PRN
  Administered 2018-02-15 – 2018-02-17 (×6): 500 mg via ORAL
  Filled 2018-02-15 (×6): qty 1

## 2018-02-15 MED ORDER — PHENOL 1.4 % MT LIQD: 1.0000 | OROMUCOSAL | Status: DC | PRN

## 2018-02-15 MED ORDER — ROCURONIUM BROMIDE 10 MG/ML (PF) SYRINGE
PREFILLED_SYRINGE | INTRAVENOUS | Status: DC | PRN
  Administered 2018-02-15: 30 mg via INTRAVENOUS

## 2018-02-15 MED ORDER — KETOROLAC TROMETHAMINE 30 MG/ML IJ SOLN
INTRAMUSCULAR | Status: AC
  Filled 2018-02-15: qty 1

## 2018-02-15 MED ORDER — BUPIVACAINE HCL (PF) 0.25 % IJ SOLN
INTRAMUSCULAR | Status: AC
  Filled 2018-02-15: qty 30

## 2018-02-15 MED ORDER — DEXAMETHASONE SODIUM PHOSPHATE 10 MG/ML IJ SOLN
10.0000 mg | Freq: Once | INTRAMUSCULAR | Status: AC
  Administered 2018-02-16: 10 mg via INTRAVENOUS
  Filled 2018-02-15: qty 1

## 2018-02-15 MED ORDER — ALBUTEROL SULFATE (2.5 MG/3ML) 0.083% IN NEBU: 2.5000 mg | INHALATION_SOLUTION | Freq: Four times a day (QID) | RESPIRATORY_TRACT | Status: DC | PRN

## 2018-02-15 MED ORDER — ONDANSETRON HCL 4 MG/2ML IJ SOLN
4.0000 mg | Freq: Four times a day (QID) | INTRAMUSCULAR | Status: DC | PRN
  Administered 2018-02-16: 4 mg via INTRAVENOUS
  Filled 2018-02-15 (×2): qty 2

## 2018-02-15 MED ORDER — DIPHENHYDRAMINE HCL 12.5 MG/5ML PO ELIX: 12.5000 mg | ORAL_SOLUTION | ORAL | Status: DC | PRN

## 2018-02-15 MED ORDER — METOCLOPRAMIDE HCL 5 MG/ML IJ SOLN: 5.0000 mg | Freq: Three times a day (TID) | INTRAMUSCULAR | Status: DC | PRN

## 2018-02-15 MED ORDER — FAMOTIDINE 20 MG PO TABS
20.0000 mg | ORAL_TABLET | Freq: Two times a day (BID) | ORAL | Status: DC
  Administered 2018-02-15 – 2018-02-17 (×4): 20 mg via ORAL
  Filled 2018-02-15 (×5): qty 1

## 2018-02-15 MED ORDER — ASPIRIN 81 MG PO CHEW
81.0000 mg | CHEWABLE_TABLET | Freq: Two times a day (BID) | ORAL | Status: DC
  Administered 2018-02-15 – 2018-02-17 (×4): 81 mg via ORAL
  Filled 2018-02-15 (×4): qty 1

## 2018-02-15 MED ORDER — FLUTICASONE FUROATE-VILANTEROL 200-25 MCG/INH IN AEPB
1.0000 | INHALATION_SPRAY | Freq: Every day | RESPIRATORY_TRACT | Status: DC
  Administered 2018-02-16 – 2018-02-17 (×2): 1 via RESPIRATORY_TRACT
  Filled 2018-02-15: qty 28

## 2018-02-15 MED ORDER — BISACODYL 10 MG RE SUPP: 10.0000 mg | Freq: Every day | RECTAL | Status: DC | PRN

## 2018-02-15 MED ORDER — STERILE WATER FOR IRRIGATION IR SOLN
Status: DC | PRN
  Administered 2018-02-15: 2000 mL

## 2018-02-15 MED ORDER — CHLORHEXIDINE GLUCONATE 4 % EX LIQD: 60.0000 mL | Freq: Once | CUTANEOUS | Status: DC

## 2018-02-15 MED ORDER — METHOCARBAMOL 500 MG IVPB - SIMPLE MED
500.0000 mg | Freq: Four times a day (QID) | INTRAVENOUS | Status: DC | PRN
  Administered 2018-02-15: 500 mg via INTRAVENOUS
  Filled 2018-02-15: qty 50

## 2018-02-15 MED ORDER — DOCUSATE SODIUM 100 MG PO CAPS
100.0000 mg | ORAL_CAPSULE | Freq: Two times a day (BID) | ORAL | Status: DC
  Administered 2018-02-15 – 2018-02-17 (×4): 100 mg via ORAL
  Filled 2018-02-15 (×4): qty 1

## 2018-02-15 MED ORDER — LACTATED RINGERS IV SOLN
INTRAVENOUS | Status: DC
  Administered 2018-02-15 (×2): via INTRAVENOUS

## 2018-02-15 MED ORDER — CLONIDINE HCL (ANALGESIA) 100 MCG/ML EP SOLN
EPIDURAL | Status: DC | PRN
  Administered 2018-02-15: 100 ug

## 2018-02-15 MED ORDER — ATORVASTATIN CALCIUM 10 MG PO TABS
10.0000 mg | ORAL_TABLET | Freq: Every day | ORAL | Status: DC
  Administered 2018-02-15 – 2018-02-16 (×2): 10 mg via ORAL
  Filled 2018-02-15 (×2): qty 1

## 2018-02-15 MED ORDER — HYDROMORPHONE HCL 1 MG/ML IJ SOLN
INTRAMUSCULAR | Status: DC | PRN
  Administered 2018-02-15: .25 mg via INTRAVENOUS

## 2018-02-15 MED ORDER — ALBUTEROL SULFATE HFA 108 (90 BASE) MCG/ACT IN AERS
INHALATION_SPRAY | RESPIRATORY_TRACT | Status: AC
  Filled 2018-02-15: qty 6.7

## 2018-02-15 MED ORDER — HYDROMORPHONE HCL 2 MG/ML IJ SOLN
INTRAMUSCULAR | Status: AC
  Filled 2018-02-15: qty 1

## 2018-02-15 MED ORDER — HYDROMORPHONE HCL 1 MG/ML IJ SOLN
INTRAMUSCULAR | Status: AC
  Filled 2018-02-15: qty 1

## 2018-02-15 MED ORDER — SODIUM CHLORIDE (PF) 0.9 % IJ SOLN
INTRAMUSCULAR | Status: DC | PRN
  Administered 2018-02-15: 30 mL

## 2018-02-15 MED ORDER — POLYETHYLENE GLYCOL 3350 17 G PO PACK: 17.0000 g | PACK | Freq: Two times a day (BID) | ORAL | 0 refills | Status: DC

## 2018-02-15 SURGICAL SUPPLY — 55 items
ATTUNE MED ANAT PAT 35 KNEE (Knees) ×2 IMPLANT
ATTUNE MED ANAT PAT 35MM KNEE (Knees) ×1 IMPLANT
ATTUNE PSFEM LTSZ4 NARCEM KNEE (Femur) ×3 IMPLANT
ATTUNE PSRP INSR SZ4 6 KNEE (Insert) ×2 IMPLANT
ATTUNE PSRP INSR SZ4 6MM KNEE (Insert) ×1 IMPLANT
BAG ZIPLOCK 12X15 (MISCELLANEOUS) IMPLANT
BANDAGE ACE 6X5 VEL STRL LF (GAUZE/BANDAGES/DRESSINGS) ×3 IMPLANT
BASE TIBIAL ROT PLAT SZ 3 KNEE (Knees) ×1 IMPLANT
BLADE SAW SGTL 11.0X1.19X90.0M (BLADE) ×3 IMPLANT
BLADE SAW SGTL 13.0X1.19X90.0M (BLADE) ×3 IMPLANT
BNDG ELASTIC 6X10 VLCR STRL LF (GAUZE/BANDAGES/DRESSINGS) ×3 IMPLANT
BOWL SMART MIX CTS (DISPOSABLE) ×3 IMPLANT
CEMENT HV SMART SET (Cement) ×6 IMPLANT
COVER SURGICAL LIGHT HANDLE (MISCELLANEOUS) ×3 IMPLANT
COVER WAND RF STERILE (DRAPES) IMPLANT
CUFF TOURN SGL QUICK 34 (TOURNIQUET CUFF) ×2
CUFF TRNQT CYL 34X4X40X1 (TOURNIQUET CUFF) ×1 IMPLANT
DECANTER SPIKE VIAL GLASS SM (MISCELLANEOUS) ×6 IMPLANT
DERMABOND ADVANCED (GAUZE/BANDAGES/DRESSINGS) ×2
DERMABOND ADVANCED .7 DNX12 (GAUZE/BANDAGES/DRESSINGS) ×1 IMPLANT
DRAPE U-SHAPE 47X51 STRL (DRAPES) ×3 IMPLANT
DRESSING AQUACEL AG SP 3.5X10 (GAUZE/BANDAGES/DRESSINGS) ×1 IMPLANT
DRSG AQUACEL AG ADV 3.5X10 (GAUZE/BANDAGES/DRESSINGS) ×3 IMPLANT
DRSG AQUACEL AG SP 3.5X10 (GAUZE/BANDAGES/DRESSINGS) ×3
DURAPREP 26ML APPLICATOR (WOUND CARE) ×6 IMPLANT
ELECT REM PT RETURN 15FT ADLT (MISCELLANEOUS) ×3 IMPLANT
GLOVE BIOGEL M 7.0 STRL (GLOVE) IMPLANT
GLOVE BIOGEL PI IND STRL 7.5 (GLOVE) ×1 IMPLANT
GLOVE BIOGEL PI IND STRL 8.5 (GLOVE) ×1 IMPLANT
GLOVE BIOGEL PI INDICATOR 7.5 (GLOVE) ×2
GLOVE BIOGEL PI INDICATOR 8.5 (GLOVE) ×2
GLOVE ECLIPSE 8.0 STRL XLNG CF (GLOVE) ×3 IMPLANT
GLOVE ORTHO TXT STRL SZ7.5 (GLOVE) ×6 IMPLANT
GOWN STRL REUS W/TWL 2XL LVL3 (GOWN DISPOSABLE) ×3 IMPLANT
GOWN STRL REUS W/TWL LRG LVL3 (GOWN DISPOSABLE) ×3 IMPLANT
HANDPIECE INTERPULSE COAX TIP (DISPOSABLE) ×2
HOLDER FOLEY CATH W/STRAP (MISCELLANEOUS) IMPLANT
MANIFOLD NEPTUNE II (INSTRUMENTS) ×3 IMPLANT
NDL SAFETY ECLIPSE 18X1.5 (NEEDLE) IMPLANT
NEEDLE HYPO 18GX1.5 SHARP (NEEDLE)
PACK TOTAL KNEE CUSTOM (KITS) ×3 IMPLANT
POSITIONER SURGICAL ARM (MISCELLANEOUS) ×3 IMPLANT
SET HNDPC FAN SPRY TIP SCT (DISPOSABLE) ×1 IMPLANT
SET PAD KNEE POSITIONER (MISCELLANEOUS) ×3 IMPLANT
SUT MNCRL AB 4-0 PS2 18 (SUTURE) ×3 IMPLANT
SUT STRATAFIX PDS+ 0 24IN (SUTURE) ×3 IMPLANT
SUT VIC AB 1 CT1 36 (SUTURE) ×3 IMPLANT
SUT VIC AB 2-0 CT1 27 (SUTURE) ×6
SUT VIC AB 2-0 CT1 TAPERPNT 27 (SUTURE) ×3 IMPLANT
SYRINGE 3CC LL L/F (MISCELLANEOUS) ×3 IMPLANT
TIBIAL BASE ROT PLAT SZ 3 KNEE (Knees) ×3 IMPLANT
TRAY FOLEY MTR SLVR 16FR STAT (SET/KITS/TRAYS/PACK) ×3 IMPLANT
WATER STERILE IRR 1000ML POUR (IV SOLUTION) ×3 IMPLANT
WRAP KNEE MAXI GEL POST OP (GAUZE/BANDAGES/DRESSINGS) ×3 IMPLANT
YANKAUER SUCT BULB TIP 10FT TU (MISCELLANEOUS) ×3 IMPLANT

## 2018-02-15 NOTE — Care Plan (Signed)
Ortho Bundle Case Management Note  Patient Details  Name: [name] MRN: [id number] Date of Birth: 1965-10-13  L TKA scheduled on 02-15-18 DCP:  Home with spouse.  2 story home with 8 ste. DME:  Has a RW.  3-in-1 ordered through [place]. PT:  Virtual PT                   DME Arranged:  3-N-1 DME Agency:  [employer]  HH Arranged:  NA HH Agency:  NA  Additional Comments: Please contact me with any questions of if this plan should need to change.  Marianne Sofia, RN,CCM [facility]  [phone] 02/15/2018, 4:48 PM

## 2018-02-15 NOTE — Discharge Instructions (Signed)

## 2018-02-15 NOTE — Anesthesia Procedure Notes (Signed)
Procedure Name: MAC Date/Time: 02/15/2018 7:00 AM Performed by: Cynda Familia, CRNA Pre-anesthesia Checklist: Patient identified, Emergency Drugs available, Suction available, Patient being monitored and Timeout performed Patient Re-evaluated:Patient Re-evaluated prior to induction Oxygen Delivery Method: Nasal cannula Placement Confirmation: breath sounds checked- equal and bilateral and positive ETCO2 Dental Injury: Teeth and Oropharynx as per pre-operative assessment  Comments: O2 --- sedation for block

## 2018-02-15 NOTE — Transfer of Care (Signed)
Immediate Anesthesia Transfer of Care Note  Patient: [name]  Procedure(s) Performed: LEFT TOTAL KNEE ARTHROPLASTY (Left Knee)  Patient Location: PACU  Anesthesia Type:General  Level of Consciousness: awake and alert   Airway & Oxygen Therapy: Patient Spontanous Breathing and Patient connected to face mask oxygen  Post-op Assessment: Report given to RN and Post -op Vital signs reviewed and stable  Post vital signs: Reviewed and stable  Last Vitals:  Vitals Value Taken Time  BP    Temp    Pulse    Resp    SpO2      Last Pain:  Vitals:   02/15/18 0635  TempSrc: Oral  PainSc: 10-Worst pain ever         Complications: No apparent anesthesia complications

## 2018-02-15 NOTE — Op Note (Signed)
NAME:  [name]                      MEDICAL RECORD NO.:  [id number]                             FACILITY:  [facility]      PHYSICIAN:  Pietro Cassis. Alvan Dame, M.D.  DATE OF BIRTH:  08-Jan-1966      DATE OF PROCEDURE:  02/15/2018                                     OPERATIVE REPORT         PREOPERATIVE DIAGNOSIS:  Left knee osteoarthritis.      POSTOPERATIVE DIAGNOSIS:  Left knee osteoarthritis. Obesity.  Diabetes     FINDINGS:  The patient was noted to have complete loss of cartilage and   bone-on-bone arthritis with associated osteophytes in the medial and patellofemoral compartments of   the knee with a significant synovitis and associated effusion.  The patient had failed months of conservative treatment including medications, injection therapy, activity modification.     PROCEDURE:  Left total knee replacement.      COMPONENTS USED:  DePuy Attune rotating platform posterior stabilized knee   system, a size 4N femur, 3 tibia, size 6 mm PS AOX insert, and 35 anatomic patellar   button.      SURGEON:  Pietro Cassis. Alvan Dame, M.D.      ASSISTANT:  [name], PA-C.      ANESTHESIA:  General and Regional.      SPECIMENS:  None.      COMPLICATION:  None.      DRAINS:  None.  EBL: <300cc      TOURNIQUET TIME:   Total Tourniquet Time Documented: Thigh (Left) - 2 minutes Thigh (Left) - 6 minutes Total: Thigh (Left) - 8 minutes  .      The patient was stable to the recovery room.      INDICATION FOR PROCEDURE:  [name] is a 52 y.o. female patient of   mine.  The patient had been seen, evaluated, and treated for months conservatively in the   office with medication, activity modification, and injections.  The patient had   radiographic changes of bone-on-bone arthritis with endplate sclerosis and osteophytes noted.  Based on the radiographic changes and failed conservative measures, the patient   decided to proceed with definitive treatment, total knee replacement.  Risks of  infection, DVT, component failure, need for revision surgery, neurovascular injury were reviewed in the office setting.  The postop course was reviewed stressing the efforts to maximize post-operative satisfaction and function.  Consent was obtained for benefit of pain   relief.      PROCEDURE IN DETAIL:  The patient was brought to the operative theater.   Once adequate anesthesia, preoperative antibiotics, 1 gm of Vancomycin, Gentamycin,1 gm of Tranexamic Acid, and 10 mg of Decadron administered, the patient was positioned supine with a left thigh tourniquet placed.  The  left lower extremity was prepped and draped in sterile fashion.  A time-   out was performed identifying the patient, planned procedure, and the appropriate extremity.      The left lower extremity was placed in the [facility] leg holder.  The leg was   exsanguinated, tourniquet elevated to 250 mmHg.  A  midline incision was   made followed by median parapatellar arthrotomy.  The tourniquet was not functioning appropriately and let down until cementing commenced.  Following initial   exposure, attention was first directed to the patella.  Precut   measurement was noted to be 23 mm.  I resected down to 14 mm and used a   35 anatomic patellar button to restore patellar height as well as cover the cut surface.      The lug holes were drilled and a metal shim was placed to protect the   patella from retractors and saw blade during the procedure.      At this point, attention was now directed to the femur.  She was noted to have pre-op hyper-extension.  The femoral   canal was opened with a drill, irrigated to try to prevent fat emboli.  An   intramedullary rod was passed at 3 degrees valgus, 9 mm of bone was   resected off the distal femur.  Following this resection, the tibia was   subluxated anteriorly.  Using the extramedullary guide, 2 mm of bone was resected off   the proximal medial tibia.  We confirmed the gap would be    stable medially and laterally with a size 5 spacer block as well as confirmed that the tibial cut was perpendicular in the coronal plane, checking with an alignment rod.      Once this was done, I sized the femur to be a size 4 in the anterior-   posterior dimension, chose a narrow component based on medial and   lateral dimension.  The size 4 rotation block was then pinned in   position anterior referenced using the C-clamp to set rotation.  The   anterior, posterior, and  chamfer cuts were made without difficulty nor   notching making certain that I was along the anterior cortex to help   with flexion gap stability.      The final box cut was made off the lateral aspect of distal femur.      At this point, the tibia was sized to be a size 3.  The size 3 tray was   then pinned in position through the medial third of the tubercle,   drilled, and keel punched.  Trial reduction was now carried with a 4 femur,  3 tibia, a size 6 mm PS insert, and the 35 anatomic patella botton.  The knee was brought to full extension with good flexion stability with the patella   tracking through the trochlea without application of pressure.  Given   all these findings the trial components removed.  Final components were   opened and cement was mixed.  Prior to irrigation of the knee the leg was re-exsanguinated and tourniquet elevated to 250 mmHg.  The knee was irrigated with normal saline solution and pulse lavage.  The synovial lining was   then injected with 30 cc of 0.25% Marcaine with epinephrine, 1 cc of Toradol and 30 cc of NS for a total of 61 cc.     Final implants were then cemented onto cleaned and dried cut surfaces of bone with the knee brought to extension with a size 6 mm PS trial insert.      Once the cement had fully cured, excess cement was removed   throughout the knee.  I confirmed that I was satisfied with the range of   motion and stability, and the final size 6 mm PS AOX insert  was  chosen.  It was   placed into the knee.      The tourniquet had been let down at 8 minutes.  No significant   hemostasis was required.  The extensor mechanism was then reapproximated using #1 Vicryl and #1 Stratafix sutures with the knee   in flexion.  The   remaining wound was closed with 2-0 Vicryl and running 4-0 Monocryl.   The knee was cleaned, dried, dressed sterilely using Dermabond and   Aquacel dressing.  The patient was then   brought to recovery room in stable condition, tolerating the procedure   well.   Please note that Physician Assistant, [name], PA-C was present for the entirety of the case, and was utilized for pre-operative positioning, peri-operative retractor management, general facilitation of the procedure and for primary wound closure at the end of the case.              Pietro Cassis Alvan Dame, M.D.    02/15/2018 8:43 AM

## 2018-02-15 NOTE — Anesthesia Procedure Notes (Signed)
Date/Time: 02/15/2018 9:20 AM Performed by: Cynda Familia, CRNA Oxygen Delivery Method: Simple face mask Placement Confirmation: positive ETCO2 and breath sounds checked- equal and bilateral Dental Injury: Teeth and Oropharynx as per pre-operative assessment

## 2018-02-15 NOTE — Anesthesia Procedure Notes (Signed)
Anesthesia Regional Block: Adductor canal block   Pre-Anesthetic Checklist: ,, timeout performed, Correct Patient, Correct Site, Correct Laterality, Correct Procedure,, site marked, risks and benefits discussed, Surgical consent,  Pre-op evaluation,  At surgeon's request and post-op pain management  Laterality: Left  Prep: chloraprep       Needles:  Injection technique: Single-shot  Needle Type: Echogenic Stimulator Needle     Needle Length: 10cm  Needle Gauge: 21     Additional Needles:   Procedures:,,,, ultrasound used (permanent image in chart),,,,  Narrative:  Start time: 02/15/2018 7:00 AM End time: 02/15/2018 7:10 AM Injection made incrementally with aspirations every 5 mL.  Performed by: Personally  Anesthesiologist: Murvin Natal, MD  Additional Notes: Functioning IV was confirmed and monitors were applied. A time-out was performed. Hand hygiene and sterile gloves were used. The thigh was placed in a frog-leg position and prepped in a sterile fashion. A 115mm 21ga Pajunk echogenic stimulator needle was placed using ultrasound guidance.  Negative aspiration and negative test dose prior to incremental administration of local anesthetic. The patient tolerated the procedure well.

## 2018-02-15 NOTE — Interval H&P Note (Signed)
History and Physical Interval Note:  02/15/2018 7:03 AM  [name]  has presented today for surgery, with the diagnosis of Left knee osteoarthritis  The various methods of treatment have been discussed with the patient and family. After consideration of risks, benefits and other options for treatment, the patient has consented to  Procedure(s) with comments: LEFT TOTAL KNEE ARTHROPLASTY (Left) - 70 as a surgical intervention .  The patient's history has been reviewed, patient examined, no change in status, stable for surgery.  I have reviewed the patient's chart and labs.  Questions were answered to the patient's satisfaction.     [name]

## 2018-02-15 NOTE — Anesthesia Procedure Notes (Signed)
Procedure Name: Intubation Date/Time: 02/15/2018 7:24 AM Performed by: Cynda Familia, CRNA Pre-anesthesia Checklist: Patient identified, Emergency Drugs available, Suction available and Patient being monitored Patient Re-evaluated:Patient Re-evaluated prior to induction Oxygen Delivery Method: Circle System Utilized Preoxygenation: Pre-oxygenation with 100% oxygen Induction Type: IV induction Ventilation: Mask ventilation without difficulty Tube type: Oral Tube size: 7.0 mm Number of attempts: 1 Airway Equipment and Method: Stylet and Oral airway Placement Confirmation: ETT inserted through vocal cords under direct vision,  positive ETCO2 and breath sounds checked- equal and bilateral Secured at: 21 cm Tube secured with: Tape Dental Injury: Teeth and Oropharynx as per pre-operative assessment  Comments: Smooth RSI --- Ellender-- intubation AM CRNA atraumatic-- no teeth preop-- mouth as preop-- bilat BS Ellender-- small mouth and limited neck mobility-- good view--- I --- bilat BS Ellender

## 2018-02-15 NOTE — Evaluation (Signed)
Physical Therapy Evaluation Patient Details Name: [name] MRN: [id number] DOB: 10-31-1965 Today's Date: 02/15/2018   History of Present Illness  Patient is a 52 y/o female admitted for L TKA with PMH of R TKA, HTN, CVA, GERD.  Clinical Impression  Patient presents with decreased mobility due to pain, limited AROM L LE and decreased strength/ROM L LE.  She will benefit from skilled PT in the acute setting to allow return home with family support and follow up PT.   Follow Up Recommendations Follow surgeon's recommendation for DC plan and follow-up therapies    Equipment Recommendations  3in1 (PT)    Recommendations for Other Services       Precautions / Restrictions Precautions Precautions: Fall;Knee Required Braces or Orthoses: Knee Immobilizer - Left Restrictions Weight Bearing Restrictions: No Other Position/Activity Restrictions: WBAT      Mobility  Bed Mobility Overal bed mobility: Needs Assistance Bed Mobility: Supine to Sit     Supine to sit: HOB elevated;Min assist     General bed mobility comments: for L LE  Transfers Overall transfer level: Needs assistance Equipment used: Rolling walker (2 wheeled) Transfers: Sit to/from [facility] Sit to Stand: Min assist Stand pivot transfers: Min assist       General transfer comment: up from EOB assist for balance/safety; increased time, difficulty weight bearing on L so cues for pivot on R and brought chair up to pt  Ambulation/Gait                Stairs            Wheelchair Mobility    Modified Rankin (Stroke Patients Only)       Balance Overall balance assessment: Needs assistance   Sitting balance-Leahy Scale: Good     Standing balance support: Bilateral upper extremity supported Standing balance-Leahy Scale: Poor Standing balance comment: UE support needed for balance/safety                             Pertinent Vitals/Pain Pain Assessment:  0-10 Pain Score: 10-Worst pain ever Pain Location: L knee (down from a "15") Pain Descriptors / Indicators: Aching;Grimacing;Guarding;Sore Pain Intervention(s): Limited activity within patient's tolerance;Monitored during session;Ice applied;Patient requesting pain meds-RN notified    Home Living Family/patient expects to be discharged to:: Private residence Living Arrangements: Spouse/significant other Available Help at Discharge: Family Type of Home: House Home Access: Stairs to enter Entrance Stairs-Rails: Left;Right(in front, no rails in garage) Entrance Stairs-Number of Steps: [employer]: Two level;Able to live on main level with bedroom/bathroom Home Equipment: Walker - 2 wheels      Prior Function Level of Independence: Independent         Comments: fell on Saturday and reports hip pain aince     Hand Dominance        Extremity/Trunk Assessment   Upper Extremity Assessment Upper Extremity Assessment: Overall WFL for tasks assessed    Lower Extremity Assessment Lower Extremity Assessment: LLE deficits/detail LLE Deficits / Details: limited by pain, ankle AROM WFL       Communication   Communication: No difficulties  Cognition Arousal/Alertness: Awake/alert Behavior During Therapy: WFL for tasks assessed/performed Overall Cognitive Status: Within Functional Limits for tasks assessed                                        General  Comments      Exercises Total Joint Exercises Ankle Circles/Pumps: AROM;5 reps;Both;Supine   Assessment/Plan    PT Assessment Patient needs continued PT services  PT Problem List Decreased strength;Decreased range of motion;Decreased activity tolerance;Decreased mobility;Decreased knowledge of use of DME;Pain       PT Treatment Interventions DME instruction;Therapeutic activities;Gait training;Therapeutic exercise;Patient/family education;Stair training;Functional mobility training    PT Goals (Current  goals can be found in the Care Plan section)  Acute Rehab PT Goals Patient Stated Goal: To go get stronger PT Goal Formulation: With patient Time For Goal Achievement: 02/22/18 Potential to Achieve Goals: Good    Frequency 7X/week   Barriers to discharge        Co-evaluation               AM-PAC PT "6 Clicks" Daily Activity  Outcome Measure Difficulty turning over in bed (including adjusting bedclothes, sheets and blankets)?: Unable Difficulty moving from lying on back to sitting on the side of the bed? : Unable Difficulty sitting down on and standing up from a chair with arms (e.g., wheelchair, bedside commode, etc,.)?: Unable Help needed moving to and from a bed to chair (including a wheelchair)?: A Little Help needed walking in hospital room?: A Little Help needed climbing 3-5 steps with a railing? : A Little 6 Click Score: 12    End of Session Equipment Utilized During Treatment: Gait belt Activity Tolerance: Patient limited by pain Patient left: with call bell/phone within reach;in chair Nurse Communication: Mobility status PT Visit Diagnosis: History of falling (Z91.81);Difficulty in walking, not elsewhere classified (R26.2);Pain Pain - Right/Left: Left Pain - part of body: Knee    Time: 1350-1415 PT Time Calculation (min) (ACUTE ONLY): 25 min   Charges:   PT Evaluation $PT Eval Low Complexity: 1 Low PT Treatments $Therapeutic Activity: 8-22 mins        [name], [name] Acute Rehabilitation Services [phone] 02/15/2018   Reginia Naas 02/15/2018, 2:42 PM

## 2018-02-15 NOTE — Anesthesia Postprocedure Evaluation (Signed)
Anesthesia Post Note  Patient: Tyson Masin  Procedure(s) Performed: LEFT TOTAL KNEE ARTHROPLASTY (Left Knee)     Patient location during evaluation: PACU Anesthesia Type: Regional and General Level of consciousness: awake and alert Pain management: pain level controlled Vital Signs Assessment: post-procedure vital signs reviewed and stable Respiratory status: spontaneous breathing, nonlabored ventilation, respiratory function stable and patient connected to nasal cannula oxygen Cardiovascular status: blood pressure returned to baseline and stable Postop Assessment: no apparent nausea or vomiting Anesthetic complications: no    Last Vitals:  Vitals:   02/15/18 1251 02/15/18 1416  BP: (!) 141/101 (!) 160/103  Pulse: 84 90  Resp: 15 17  Temp: 36.7 C 36.6 C  SpO2: 99% 99%    Last Pain:  Vitals:   02/15/18 1416  TempSrc: Oral  PainSc:                  Ryan P Ellender

## 2018-02-16 ENCOUNTER — Encounter (HOSPITAL_COMMUNITY): Payer: Self-pay | Admitting: Orthopedic Surgery

## 2018-02-16 DIAGNOSIS — Z91018 Allergy to other foods: Secondary | ICD-10-CM | POA: Diagnosis not present

## 2018-02-16 DIAGNOSIS — Z833 Family history of diabetes mellitus: Secondary | ICD-10-CM | POA: Diagnosis not present

## 2018-02-16 DIAGNOSIS — Z79899 Other long term (current) drug therapy: Secondary | ICD-10-CM | POA: Diagnosis not present

## 2018-02-16 DIAGNOSIS — Z7982 Long term (current) use of aspirin: Secondary | ICD-10-CM | POA: Diagnosis not present

## 2018-02-16 DIAGNOSIS — Z90710 Acquired absence of both cervix and uterus: Secondary | ICD-10-CM | POA: Diagnosis not present

## 2018-02-16 DIAGNOSIS — M25762 Osteophyte, left knee: Secondary | ICD-10-CM | POA: Diagnosis not present

## 2018-02-16 DIAGNOSIS — E669 Obesity, unspecified: Secondary | ICD-10-CM | POA: Diagnosis not present

## 2018-02-16 DIAGNOSIS — Z791 Long term (current) use of non-steroidal anti-inflammatories (NSAID): Secondary | ICD-10-CM | POA: Diagnosis not present

## 2018-02-16 DIAGNOSIS — Z87891 Personal history of nicotine dependence: Secondary | ICD-10-CM | POA: Diagnosis not present

## 2018-02-16 DIAGNOSIS — Z96651 Presence of right artificial knee joint: Secondary | ICD-10-CM | POA: Diagnosis present

## 2018-02-16 DIAGNOSIS — D509 Iron deficiency anemia, unspecified: Secondary | ICD-10-CM | POA: Diagnosis not present

## 2018-02-16 DIAGNOSIS — J45909 Unspecified asthma, uncomplicated: Secondary | ICD-10-CM | POA: Diagnosis not present

## 2018-02-16 DIAGNOSIS — Z91040 Latex allergy status: Secondary | ICD-10-CM | POA: Diagnosis not present

## 2018-02-16 DIAGNOSIS — M1712 Unilateral primary osteoarthritis, left knee: Secondary | ICD-10-CM | POA: Diagnosis not present

## 2018-02-16 DIAGNOSIS — E119 Type 2 diabetes mellitus without complications: Secondary | ICD-10-CM | POA: Diagnosis not present

## 2018-02-16 DIAGNOSIS — Z6839 Body mass index (BMI) 39.0-39.9, adult: Secondary | ICD-10-CM | POA: Diagnosis not present

## 2018-02-16 DIAGNOSIS — Z886 Allergy status to analgesic agent status: Secondary | ICD-10-CM | POA: Diagnosis not present

## 2018-02-16 DIAGNOSIS — M659 Synovitis and tenosynovitis, unspecified: Secondary | ICD-10-CM | POA: Diagnosis not present

## 2018-02-16 DIAGNOSIS — Z885 Allergy status to narcotic agent status: Secondary | ICD-10-CM | POA: Diagnosis not present

## 2018-02-16 DIAGNOSIS — Z801 Family history of malignant neoplasm of trachea, bronchus and lung: Secondary | ICD-10-CM | POA: Diagnosis not present

## 2018-02-16 DIAGNOSIS — I10 Essential (primary) hypertension: Secondary | ICD-10-CM | POA: Diagnosis not present

## 2018-02-16 DIAGNOSIS — Z8614 Personal history of Methicillin resistant Staphylococcus aureus infection: Secondary | ICD-10-CM | POA: Diagnosis not present

## 2018-02-16 DIAGNOSIS — Z880 Allergy status to penicillin: Secondary | ICD-10-CM | POA: Diagnosis not present

## 2018-02-16 DIAGNOSIS — Z7951 Long term (current) use of inhaled steroids: Secondary | ICD-10-CM | POA: Diagnosis not present

## 2018-02-16 LAB — CBC
HCT: 32.7 % — ABNORMAL LOW (ref 36.0–46.0)
HEMOGLOBIN: 10.3 g/dL — AB (ref 12.0–15.0)
MCH: 30.7 pg (ref 26.0–34.0)
MCHC: 31.5 g/dL (ref 30.0–36.0)
MCV: 97.3 fL (ref 80.0–100.0)
PLATELETS: 302 10*3/uL (ref 150–400)
RBC: 3.36 MIL/uL — ABNORMAL LOW (ref 3.87–5.11)
RDW: 13.7 % (ref 11.5–15.5)
WBC: 9.1 10*3/uL (ref 4.0–10.5)
nRBC: 0 % (ref 0.0–0.2)

## 2018-02-16 LAB — BASIC METABOLIC PANEL
Anion gap: 9 (ref 5–15)
BUN: 6 mg/dL (ref 6–20)
CALCIUM: 8.5 mg/dL — AB (ref 8.9–10.3)
CO2: 26 mmol/L (ref 22–32)
CREATININE: 0.63 mg/dL (ref 0.44–1.00)
Chloride: 102 mmol/L (ref 98–111)
GFR calc non Af Amer: >60 mL/min (ref >60)
Glucose, Bld: 134 mg/dL — ABNORMAL HIGH (ref 70–99)
Potassium: 3.7 mmol/L (ref 3.5–5.1)
SODIUM: 137 mmol/L (ref 135–145)

## 2018-02-16 LAB — GLUCOSE, CAPILLARY
GLUCOSE-CAPILLARY: 138 mg/dL — AB (ref 70–99)
GLUCOSE-CAPILLARY: 228 mg/dL — AB (ref 70–99)
Glucose-Capillary: 146 mg/dL — ABNORMAL HIGH (ref 70–99)
Glucose-Capillary: 146 mg/dL — ABNORMAL HIGH (ref 70–99)

## 2018-02-16 NOTE — Plan of Care (Signed)
Plan of care reviewed and discussed with patient and spouse.

## 2018-02-16 NOTE — Progress Notes (Signed)
Physical Therapy Treatment Patient Details Name: [name] MRN: [id number] DOB: [birth year] Today's Date: 02/16/2018    History of Present Illness Patient is a 52 y/o female admitted for L TKA with PMH of R TKA, HTN, CVA, GERD.    PT Comments    Pt ambulated in hallway and performed LE exercises once back in bed.  Pt reports d/c home tomorrow.  Pt plans to have virtual PT upon d/c home.     Follow Up Recommendations  Follow surgeon's recommendation for DC plan and follow-up therapies([facility])     Equipment Recommendations  3in1 (PT)    Recommendations for Other Services       Precautions / Restrictions Precautions Precautions: Fall;Knee Restrictions Other Position/Activity Restrictions: WBAT    Mobility  Bed Mobility Overal bed mobility: Needs Assistance Bed Mobility: Sit to Supine     Supine to sit: Min guard;HOB elevated Sit to supine: Min assist   General bed mobility comments: assist for L LE  Transfers Overall transfer level: Needs assistance Equipment used: Rolling walker (2 wheeled) Transfers: Sit to/from Stand Sit to Stand: Min guard         General transfer comment: verbal cues for UE and LE positioning  Ambulation/Gait Ambulation/Gait assistance: Min assist;Min guard Gait Distance (Feet): 120 Feet Assistive device: Rolling walker (2 wheeled) Gait Pattern/deviations: Step-to pattern;Decreased stance time - left;Antalgic     General Gait Details: verbal cues for sequence, RW positioning, step length, family assisted with encouraging distance   Stairs             Wheelchair Mobility    Modified Rankin (Stroke Patients Only)       Balance                                            Cognition Arousal/Alertness: Awake/alert Behavior During Therapy: WFL for tasks assessed/performed Overall Cognitive Status: Within Functional Limits for tasks assessed                                         Exercises Total Joint Exercises Ankle Circles/Pumps: AROM;Both;Supine;10 reps Quad Sets: AROM;10 reps;Left Short Arc Quad: AROM;10 reps;Left Heel Slides: AAROM;10 reps;Left Hip ABduction/ADduction: AROM;10 reps;Left    General Comments        Pertinent Vitals/Pain Pain Assessment: 0-10 Pain Score: 7  Pain Location: L knee Pain Descriptors / Indicators: Aching;Grimacing;Sore Pain Intervention(s): Repositioned;Limited activity within patient's tolerance;Monitored during session    Home Living                      Prior Function            PT Goals (current goals can now be found in the care plan section) Progress towards PT goals: Progressing toward goals    Frequency    7X/week      PT Plan Current plan remains appropriate    Co-evaluation              AM-PAC PT "6 Clicks" Daily Activity  Outcome Measure  Difficulty turning over in bed (including adjusting bedclothes, sheets and blankets)?: A Lot Difficulty moving from lying on back to sitting on the side of the bed? : Unable Difficulty sitting down on and standing up from a chair with arms (e.g., wheelchair,  bedside commode, etc,.)?: Unable Help needed moving to and from a bed to chair (including a wheelchair)?: A Little Help needed walking in hospital room?: A Little Help needed climbing 3-5 steps with a railing? : A Lot 6 Click Score: 12    End of Session Equipment Utilized During Treatment: Gait belt Activity Tolerance: Patient tolerated treatment well Patient left: in bed;with family/visitor present;with call bell/phone within reach Nurse Communication: Mobility status PT Visit Diagnosis: Difficulty in walking, not elsewhere classified (R26.2);Pain Pain - Right/Left: Left Pain - part of body: Knee     Time: 1324-4010 PT Time Calculation (min) (ACUTE ONLY): 19 min  Charges:  $Gait Training: 8-22 mins $Therapeutic Exercise: 8-22 mins                     Carmelia Bake, PT, DPT Acute  Rehabilitation Services Office: [phone] Pager: [phone]  Trena Platt 02/16/2018, 3:13 PM

## 2018-02-16 NOTE — Progress Notes (Signed)
     Subjective: 1 Day Post-Op Procedure(s) (LRB): LEFT TOTAL KNEE ARTHROPLASTY (Left)   Patient reports pain as moderate/severe.  She feels that the oral medication is not fully controlling her pain and that she is still needing the Dilaudid IV.  I have discussed changing her medication to obtain better pain relief.  Plan for discharge tomorrow due to underlying medical co-morbidities, pain control and need for inpatient therapy to meet goal of being discharged home safely with family/caregiver.   Anticipated LOS equal to or greater than 2 midnights due to - Age 20 and older with one or more of the following:  - Obesity  - Expected need for hospital services (PT, OT, Nursing) required for safe  discharge  - Anticipated need for postoperative skilled nursing care or inpatient rehab  - Active co-morbidities: Anemia    Objective:   VITALS:   Vitals:   02/16/18 0141 02/16/18 0619  BP: (!) 141/96 (!) 148/92  Pulse: (!) 105 (!) 109  Resp: 20 18  Temp: 98.5 F (36.9 C) 98.9 F (37.2 C)  SpO2: 95% 96%    Dorsiflexion/Plantar flexion intact Incision: dressing C/D/I No cellulitis present Compartment soft  LABS Recent Labs    02/16/18 0427  HGB 10.3*  HCT 32.7*  WBC 9.1  PLT 302    Recent Labs    02/16/18 0427  NA 137  K 3.7  BUN 6  CREATININE 0.63  GLUCOSE 134*     Assessment/Plan: 1 Day Post-Op Procedure(s) (LRB): LEFT TOTAL KNEE ARTHROPLASTY (Left) Advance diet Up with therapy D/C IV fluids Discharge home Follow up in 2 weeks at [facility] ([place] Follow up with OLIN,Harman Langhans D in 2 weeks.  Contact information:  [facility]) [address] [place] [address] [phone]     Morbid Obesity (BMI >40)  Estimated body mass index is 42.3 kg/m as calculated from the following:   Height as of this encounter: 4' 11.5" (1.511 m).   Weight as of this encounter: 96.6  kg. Patient also counseled that weight may inhibit the healing process Patient counseled that losing weight will help with future health issues        West Pugh. Dorman Calderwood   PAC  02/16/2018, 9:25 AM

## 2018-02-16 NOTE — Progress Notes (Signed)
Physical Therapy Treatment Patient Details Name: [name] MRN: [id number] DOB: Jun 15, 1965 Today's Date: 02/16/2018    History of Present Illness Patient is a 52 y/o female admitted for L TKA with PMH of R TKA, HTN, CVA, GERD.    PT Comments    Pt reports pain still 9/10 despite medication.  Pt agreeable to ambulate short distance as tolerated however.  Pt encouraged to sit up in recliner end of session.   Follow Up Recommendations  Follow surgeon's recommendation for DC plan and follow-up therapies     Equipment Recommendations  3in1 (PT)    Recommendations for Other Services       Precautions / Restrictions Precautions Precautions: Fall;Knee Restrictions Other Position/Activity Restrictions: WBAT    Mobility  Bed Mobility Overal bed mobility: Needs Assistance Bed Mobility: Supine to Sit     Supine to sit: Min guard;HOB elevated        Transfers Overall transfer level: Needs assistance Equipment used: Rolling walker (2 wheeled) Transfers: Sit to/from Stand Sit to Stand: Min assist;From elevated surface         General transfer comment: verbal cues for UE and LE positioning, assist to rise and steady  Ambulation/Gait Ambulation/Gait assistance: Min assist Gait Distance (Feet): 20 Feet Assistive device: Rolling walker (2 wheeled) Gait Pattern/deviations: Step-to pattern;Decreased stance time - left;Antalgic     General Gait Details: verbal cues for sequence, RW positioning, step length, distance limited due to pain   Stairs             Wheelchair Mobility    Modified Rankin (Stroke Patients Only)       Balance                                            Cognition Arousal/Alertness: Awake/alert Behavior During Therapy: WFL for tasks assessed/performed Overall Cognitive Status: Within Functional Limits for tasks assessed                                        Exercises      General Comments        Pertinent Vitals/Pain Pain Assessment: 0-10 Pain Score: 9  Pain Location: L knee Pain Descriptors / Indicators: Aching;Grimacing;Sore Pain Intervention(s): Limited activity within patient's tolerance;Repositioned;Monitored during session;Premedicated before session    Home Living                      Prior Function            PT Goals (current goals can now be found in the care plan section) Progress towards PT goals: Progressing toward goals    Frequency    7X/week      PT Plan Current plan remains appropriate    Co-evaluation              AM-PAC PT "6 Clicks" Daily Activity  Outcome Measure  Difficulty turning over in bed (including adjusting bedclothes, sheets and blankets)?: A Lot Difficulty moving from lying on back to sitting on the side of the bed? : A Lot Difficulty sitting down on and standing up from a chair with arms (e.g., wheelchair, bedside commode, etc,.)?: Unable Help needed moving to and from a bed to chair (including a wheelchair)?: A Little Help needed walking in hospital room?:  A Little Help needed climbing 3-5 steps with a railing? : A Lot 6 Click Score: 13    End of Session Equipment Utilized During Treatment: Gait belt Activity Tolerance: Patient limited by pain Patient left: with call bell/phone within reach;in chair;with family/visitor present Nurse Communication: Mobility status PT Visit Diagnosis: Difficulty in walking, not elsewhere classified (R26.2);Pain Pain - Right/Left: Left Pain - part of body: Knee     Time: 8138-8719 PT Time Calculation (min) (ACUTE ONLY): 19 min  Charges:  $Gait Training: 8-22 mins                    Carmelia Bake, PT, DPT Acute Rehabilitation Services Office: [phone] Pager: [phone]  Trena Platt 02/16/2018, 12:56 PM

## 2018-02-16 NOTE — Care Management Obs Status (Signed)
[facility] NOTIFICATION   Patient Details  Name: [name] MRN: [id number] Date of Birth: [birth year]   Medicare Observation Status Notification Given:  Yes    Guadalupe Maple, RN 02/16/2018, 11:05 AM

## 2018-02-17 LAB — CBC
HCT: 32 % — ABNORMAL LOW (ref 36.0–46.0)
Hemoglobin: 9.6 g/dL — ABNORMAL LOW (ref 12.0–15.0)
MCH: 29.8 pg (ref 26.0–34.0)
MCHC: 30 g/dL (ref 30.0–36.0)
MCV: 99.4 fL (ref 80.0–100.0)
Platelets: 283 10*3/uL (ref 150–400)
RBC: 3.22 MIL/uL — AB (ref 3.87–5.11)
RDW: 13.8 % (ref 11.5–15.5)
WBC: 9.7 10*3/uL (ref 4.0–10.5)
nRBC: 0 % (ref 0.0–0.2)

## 2018-02-17 LAB — BASIC METABOLIC PANEL
Anion gap: 8 (ref 5–15)
BUN: 10 mg/dL (ref 6–20)
CO2: 28 mmol/L (ref 22–32)
CREATININE: 0.6 mg/dL (ref 0.44–1.00)
Calcium: 8.5 mg/dL — ABNORMAL LOW (ref 8.9–10.3)
Chloride: 104 mmol/L (ref 98–111)
GFR calc Af Amer: >60 mL/min (ref >60)
Glucose, Bld: 124 mg/dL — ABNORMAL HIGH (ref 70–99)
POTASSIUM: 4.1 mmol/L (ref 3.5–5.1)
SODIUM: 140 mmol/L (ref 135–145)

## 2018-02-17 LAB — GLUCOSE, CAPILLARY
Glucose-Capillary: 96 mg/dL (ref 70–99)
Glucose-Capillary: 112 mg/dL — ABNORMAL HIGH (ref 70–99)

## 2018-02-17 NOTE — Progress Notes (Signed)
Physical Therapy Treatment Patient Details Name: [name] MRN: [id number] DOB: 1965/04/27 Today's Date: 02/17/2018    History of Present Illness Patient is a 52 y/o female admitted for L TKA with PMH of R TKA, HTN, CVA, GERD.    PT Comments    Ready for Dc.   Follow Up Recommendations  Follow surgeon's recommendation for DC plan and follow-up therapies     Equipment Recommendations  3in1 (PT)    Recommendations for Other Services       Precautions / Restrictions Precautions Precautions: Fall;Knee    Mobility  Bed Mobility Overal bed mobility: Needs Assistance Bed Mobility: Supine to Sit     Supine to sit: Supervision     General bed mobility comments: no assistance required  Transfers Overall transfer level: Needs assistance Equipment used: Rolling walker (2 wheeled) Transfers: Sit to/from Stand Sit to Stand: Supervision Stand pivot transfers: +2 safety/equipment       General transfer comment: verbal cues for UE and LE positioning to not plop to recliner. Pracied on/off high bed with stepstool. spouse  present.  Ambulation/Gait Ambulation/Gait assistance: Min guard Gait Distance (Feet): 40 Feet Assistive device: Rolling walker (2 wheeled) Gait Pattern/deviations: Step-to pattern;Step-through pattern     General Gait Details: verbal cues for sequence, RW positioning, step length, family assisted    Stairs Stairs: Yes Stairs assistance: Min assist Stair Management: One rail Left;Sideways Number of Stairs: 4 General stair comments: spouse assisted   Wheelchair Mobility    Modified Rankin (Stroke Patients Only)       Balance                                            Cognition Arousal/Alertness: Awake/alert                                            Exercises Total Joint Exercises Ankle Circles/Pumps: AROM;Both;Supine;10 reps Quad Sets: AROM;10 reps;Left Short Arc Quad: AROM;10 reps;Left Heel  Slides: AAROM;10 reps;Left Straight Leg Raises: AROM;Left;10 reps;Supine Goniometric ROM: 10-50 left knee flexion    General Comments        Pertinent Vitals/Pain Pain Score: 3  Pain Location: L knee Pain Descriptors / Indicators: Discomfort Pain Intervention(s): Monitored during session;Premedicated before session;Ice applied    Home Living                      Prior Function            PT Goals (current goals can now be found in the care plan section) Progress towards PT goals: Progressing toward goals    Frequency    7X/week      PT Plan Current plan remains appropriate    Co-evaluation              AM-PAC PT "6 Clicks" Daily Activity  Outcome Measure  Difficulty turning over in bed (including adjusting bedclothes, sheets and blankets)?: A Little Difficulty moving from lying on back to sitting on the side of the bed? : A Little Difficulty sitting down on and standing up from a chair with arms (e.g., wheelchair, bedside commode, etc,.)?: A Little Help needed moving to and from a bed to chair (including a wheelchair)?: A Little Help needed walking in hospital room?:  A Little Help needed climbing 3-5 steps with a railing? : A Lot 6 Click Score: 17    End of Session   Activity Tolerance: Patient tolerated treatment well Patient left: in chair Nurse Communication: Mobility status PT Visit Diagnosis: Difficulty in walking, not elsewhere classified (R26.2);Pain Pain - Right/Left: Left Pain - part of body: Knee     Time: 8242-3536 PT Time Calculation (min) (ACUTE ONLY): 16 min  Charges:  $Gait Training: 8-22 mins                     [place] Pager [phone] Office [phone]    Claretha Cooper 02/17/2018, 4:58 PM

## 2018-02-17 NOTE — Progress Notes (Signed)
Physical Therapy Treatment Patient Details Name: [name] MRN: [id number] DOB: 10-16-1965 Today's Date: 02/17/2018    History of Present Illness Patient is a 52 y/o female admitted for L TKA with PMH of R TKA, HTN, CVA, GERD.    PT Comments    The patient is progressing very well. Plans DC today after PT.   Follow Up Recommendations  Follow surgeon's recommendation for DC plan and follow-up therapies     Equipment Recommendations  3in1 (PT)    Recommendations for Other Services       Precautions / Restrictions Precautions Precautions: Fall;Knee    Mobility  Bed Mobility Overal bed mobility: Needs Assistance Bed Mobility: Supine to Sit     Supine to sit: Supervision     General bed mobility comments: no assistance required  Transfers Overall transfer level: Needs assistance Equipment used: Rolling walker (2 wheeled) Transfers: Sit to/from Stand   Stand pivot transfers: +2 safety/equipment       General transfer comment: verbal cues for UE and LE positioning to not plop to recliner. Pracied on/off high bed with stepstool. spouse  present.  Ambulation/Gait Ambulation/Gait assistance: Min guard Gait Distance (Feet): 120 Feet Assistive device: Rolling walker (2 wheeled) Gait Pattern/deviations: Step-to pattern;Step-through pattern     General Gait Details: verbal cues for sequence, RW positioning, step length, family assisted    Stairs             Wheelchair Mobility    Modified Rankin (Stroke Patients Only)       Balance                                            Cognition Arousal/Alertness: Awake/alert                                            Exercises Total Joint Exercises Ankle Circles/Pumps: AROM;Both;Supine;10 reps Quad Sets: AROM;10 reps;Left Short Arc Quad: AROM;10 reps;Left Heel Slides: AAROM;10 reps;Left Straight Leg Raises: AROM;Left;10 reps;Supine Goniometric ROM: 10-50 left knee  flexion    General Comments        Pertinent Vitals/Pain Pain Score: 3  Pain Location: L knee Pain Descriptors / Indicators: Discomfort Pain Intervention(s): Monitored during session;Premedicated before session;Ice applied    Home Living                      Prior Function            PT Goals (current goals can now be found in the care plan section) Progress towards PT goals: Progressing toward goals    Frequency    7X/week      PT Plan Current plan remains appropriate    Co-evaluation              AM-PAC PT "6 Clicks" Daily Activity  Outcome Measure  Difficulty turning over in bed (including adjusting bedclothes, sheets and blankets)?: A Little Difficulty moving from lying on back to sitting on the side of the bed? : A Little Difficulty sitting down on and standing up from a chair with arms (e.g., wheelchair, bedside commode, etc,.)?: A Lot Help needed moving to and from a bed to chair (including a wheelchair)?: A Little Help needed walking in hospital room?: A Little Help needed  climbing 3-5 steps with a railing? : A Lot 6 Click Score: 16    End of Session   Activity Tolerance: Patient tolerated treatment well Patient left: in bed Nurse Communication: Mobility status PT Visit Diagnosis: Difficulty in walking, not elsewhere classified (R26.2);Pain Pain - Right/Left: Left Pain - part of body: Knee     Time: 4734-0370 PT Time Calculation (min) (ACUTE ONLY): 22 min  Charges:  $Gait Training: 8-22 mins                     [place] Pager [phone] Office [phone]    Claretha Cooper 02/17/2018, 1:18 PM

## 2018-02-17 NOTE — Progress Notes (Signed)
Patient ID: Agam Tuohy, female   DOB: 1966/02/22, 52 y.o.   MRN: [id number] Subjective: 2 Days Post-Op Procedure(s) (LRB): LEFT TOTAL KNEE ARTHROPLASTY (Left)    Patient reports pain as mild to moderate depending on activity. No events.  Feels that she is doing better and ready to go home after therapy today  Objective:   VITALS:   Vitals:   02/17/18 0853 02/17/18 1015  BP:  127/79  Pulse:  91  Resp:  16  Temp:  97.9 F (36.6 C)  SpO2: 98% 95%    Neurovascular intact Incision: dressing C/D/I  LABS Recent Labs    02/16/18 0427 02/17/18 0427  HGB 10.3* 9.6*  HCT 32.7* 32.0*  WBC 9.1 9.7  PLT 302 283    Recent Labs    02/16/18 0427 02/17/18 0427  NA 137 140  K 3.7 4.1  BUN 6 10  CREATININE 0.63 0.60  GLUCOSE 134* 124*    No results for input(s): LABPT, INR in the last 72 hours.   Assessment/Plan: 2 Days Post-Op Procedure(s) (LRB): LEFT TOTAL KNEE ARTHROPLASTY (Left)   Up with therapy  Home today after therapy Add celebrex to regiment RTC in 2 weeks Goals reviewed

## 2018-02-17 NOTE — Plan of Care (Signed)
Plan of care reviewed.  Continue with current plan. Pt voices u/o plan.

## 2018-02-20 ENCOUNTER — Emergency Department (HOSPITAL_COMMUNITY): Payer: Medicare Other

## 2018-02-20 ENCOUNTER — Encounter (HOSPITAL_COMMUNITY): Payer: Self-pay | Admitting: Obstetrics and Gynecology

## 2018-02-20 ENCOUNTER — Emergency Department (HOSPITAL_COMMUNITY)
Admission: EM | Admit: 2018-02-20 | Discharge: 2018-02-20 | Disposition: A | Discharge status: Home / Self Care | Payer: Medicare Other | Attending: Emergency Medicine | Admitting: Emergency Medicine

## 2018-02-20 ENCOUNTER — Other Ambulatory Visit: Payer: Self-pay

## 2018-02-20 ENCOUNTER — Emergency Department (HOSPITAL_BASED_OUTPATIENT_CLINIC_OR_DEPARTMENT_OTHER): Payer: Medicare Other

## 2018-02-20 DIAGNOSIS — R509 Fever, unspecified: Secondary | ICD-10-CM | POA: Diagnosis not present

## 2018-02-20 DIAGNOSIS — Z79899 Other long term (current) drug therapy: Secondary | ICD-10-CM | POA: Insufficient documentation

## 2018-02-20 DIAGNOSIS — I10 Essential (primary) hypertension: Secondary | ICD-10-CM | POA: Insufficient documentation

## 2018-02-20 DIAGNOSIS — R2242 Localized swelling, mass and lump, left lower limb: Secondary | ICD-10-CM | POA: Insufficient documentation

## 2018-02-20 DIAGNOSIS — M79609 Pain in unspecified limb: Secondary | ICD-10-CM | POA: Diagnosis not present

## 2018-02-20 DIAGNOSIS — M25562 Pain in left knee: Secondary | ICD-10-CM | POA: Insufficient documentation

## 2018-02-20 DIAGNOSIS — K219 Gastro-esophageal reflux disease without esophagitis: Secondary | ICD-10-CM | POA: Diagnosis not present

## 2018-02-20 DIAGNOSIS — E119 Type 2 diabetes mellitus without complications: Secondary | ICD-10-CM | POA: Insufficient documentation

## 2018-02-20 DIAGNOSIS — J45909 Unspecified asthma, uncomplicated: Secondary | ICD-10-CM | POA: Diagnosis not present

## 2018-02-20 DIAGNOSIS — Z87891 Personal history of nicotine dependence: Secondary | ICD-10-CM | POA: Insufficient documentation

## 2018-02-20 DIAGNOSIS — M25462 Effusion, left knee: Secondary | ICD-10-CM

## 2018-02-20 DIAGNOSIS — M7989 Other specified soft tissue disorders: Secondary | ICD-10-CM

## 2018-02-20 LAB — BASIC METABOLIC PANEL
Anion gap: 10 (ref 5–15)
BUN: 8 mg/dL (ref 6–20)
CALCIUM: 8.5 mg/dL — AB (ref 8.9–10.3)
CO2: 27 mmol/L (ref 22–32)
CREATININE: 0.61 mg/dL (ref 0.44–1.00)
Chloride: 101 mmol/L (ref 98–111)
GFR calc non Af Amer: >60 mL/min (ref >60)
Glucose, Bld: 110 mg/dL — ABNORMAL HIGH (ref 70–99)
Potassium: 4.2 mmol/L (ref 3.5–5.1)
SODIUM: 138 mmol/L (ref 135–145)

## 2018-02-20 LAB — CBC WITH DIFFERENTIAL/PLATELET
ABS IMMATURE GRANULOCYTES: 0.07 10*3/uL (ref 0.00–0.07)
BASOS PCT: 0 %
Basophils Absolute: 0.1 10*3/uL (ref 0.0–0.1)
Eosinophils Absolute: 0.2 10*3/uL (ref 0.0–0.5)
Eosinophils Relative: 2 %
HEMATOCRIT: 33.1 % — AB (ref 36.0–46.0)
Hemoglobin: 10.2 g/dL — ABNORMAL LOW (ref 12.0–15.0)
Immature Granulocytes: 1 %
LYMPHS ABS: 4.2 10*3/uL — AB (ref 0.7–4.0)
Lymphocytes Relative: 35 %
MCH: 30 pg (ref 26.0–34.0)
MCHC: 30.8 g/dL (ref 30.0–36.0)
MCV: 97.4 fL (ref 80.0–100.0)
MONO ABS: 0.6 10*3/uL (ref 0.1–1.0)
MONOS PCT: 5 %
NEUTROS ABS: 6.8 10*3/uL (ref 1.7–7.7)
Neutrophils Relative %: 57 %
PLATELETS: 422 10*3/uL — AB (ref 150–400)
RBC: 3.4 MIL/uL — ABNORMAL LOW (ref 3.87–5.11)
RDW: 14.1 % (ref 11.5–15.5)
WBC: 11.9 10*3/uL — ABNORMAL HIGH (ref 4.0–10.5)
nRBC: 0 % (ref 0.0–0.2)

## 2018-02-20 LAB — I-STAT TROPONIN, ED: TROPONIN I, POC: 0 ng/mL (ref 0.00–0.08)

## 2018-02-20 MED ORDER — HYDROMORPHONE HCL 1 MG/ML IJ SOLN
0.5000 mg | Freq: Once | INTRAMUSCULAR | Status: AC
  Administered 2018-02-20: 0.5 mg via INTRAVENOUS
  Filled 2018-02-20: qty 1

## 2018-02-20 MED ORDER — ONDANSETRON HCL 4 MG/2ML IJ SOLN
4.0000 mg | Freq: Once | INTRAMUSCULAR | Status: AC
  Administered 2018-02-20: 4 mg via INTRAVENOUS
  Filled 2018-02-20: qty 2

## 2018-02-20 MED ORDER — CEPHALEXIN 500 MG PO CAPS: 500.0000 mg | ORAL_CAPSULE | Freq: Four times a day (QID) | ORAL | 0 refills | Status: DC

## 2018-02-20 MED ORDER — ONDANSETRON 4 MG PO TBDP: 4.0000 mg | ORAL_TABLET | Freq: Three times a day (TID) | ORAL | 0 refills | Status: DC | PRN

## 2018-02-20 NOTE — ED Provider Notes (Addendum)
[facility] DEPT Provider Note   CSN: [id number] Arrival date & time: 02/20/18  1633     History   Chief Complaint Chief Complaint  Patient presents with  . Leg Swelling  . Shortness of Breath    HPI [name] is a 52 y.o. female who presents with left knee pain and swelling. PMH significant for arthritis, DM, GERD, obesity, asthma. She states she had a left knee replacement on Tuesday by Dr. Alvan Dame. Everything went well post-op. About 2-3 days ago she started to have worsening pain and swelling of her knee. She has been working with PT and initially was progressing but since she's had more pain she hasn't been able to do exercises. She's had redness, bruising, and diffuse pain of the knee. She has had low grade temps of 100 and nausea. She also reports some SOB with exertion that is better with rest. No chest pain or syncope. No abdominal pain or vomiting. There has been a small amount of drainage coming from the knee wound. She takes an 81mg  ASA.  HPI  Past Medical History:  Diagnosis Date  . Anxiety   . Arthritis    osteoarthritis-knees  . Asthma   . Breast fibroadenoma, left   . Depression   . Diabetes mellitus without complication ([place])   . 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 GERD (gastroesophageal reflux disease)   . Headache(784.0)    occ. with allergies/sinus issues  . History of colon polyps 04/2016  . History of MRSA infection 2007  . Hypertension    no problems since weight loss -'09  . Multiple food allergies    Latex allergy-respiratory  . Obesity   . Tendonitis   . Vitamin D deficiency    history of    Patient Active Problem List   Diagnosis Date Noted  . S/P left TKA 02/15/2018  . Insomnia 02/06/2013  . Anxiety and depression 02/06/2013  . Unspecified constipation 02/06/2013  . GERD (gastroesophageal reflux disease) 02/06/2013  . Anemia, iron  deficiency 02/06/2013  . Expected blood loss anemia 02/01/2013  . Obese 02/01/2013  . S/P right TKA 01/31/2013  . Headache(784.0) 11/28/2012  . Stroke-like symptoms 11/26/2012  . CHEST PAIN UNSPECIFIED 03/06/2009  . DEPRESSION 03/05/2009  . HYPERTENSION 03/05/2009  . FATTY LIVER DISEASE 03/05/2009    Past Surgical History:  Procedure Laterality Date  . ABDOMINAL HYSTERECTOMY    . BOWEL RESECTION    . CESAREAN SECTION     x4  . COLONOSCOPY W/ POLYPECTOMY  04/2016  . KNEE SURGERY     Right knee scope 8'12  . TOTAL KNEE ARTHROPLASTY Right 01/31/2013   Procedure: RIGHT TOTAL KNEE ARTHROPLASTY;  Surgeon: [name], MD;  Location: [facility] ORS;  Service: Orthopedics;  Laterality: Right;  . TOTAL KNEE ARTHROPLASTY Left 02/15/2018   Procedure: LEFT TOTAL KNEE ARTHROPLASTY;  Surgeon: Paralee Cancel, MD;  Location: [facility] ORS;  Service: Orthopedics;  Laterality: Left;  70     OB History   None      Home Medications    Prior to Admission medications   Medication Sig Start Date End Date Taking? Authorizing Provider  albuterol (PROVENTIL HFA;VENTOLIN HFA) 108 (90 BASE) MCG/ACT inhaler Inhale 2 puffs into the lungs every 6 (six) hours as needed for wheezing or shortness of breath.    [provider]  aspirin (ASPIRIN CHILDRENS) 81 MG chewable tablet Chew 1 tablet (81  mg total) by mouth 2 (two) times daily. Take for 4 weeks, then resume regular dose. 02/16/18 03/18/18  [name], PA-C  atorvastatin (LIPITOR) 10 MG tablet Take 10 mg by mouth at bedtime.    [provider]  BREO ELLIPTA 200-25 MCG/INH AEPB Inhale 1 puff into the lungs daily. 12/28/17   [provider]  busPIRone (BUSPAR) 15 MG tablet Take 15 mg by mouth 3 (three) times daily.    [provider]  desvenlafaxine (PRISTIQ) 100 MG 24 hr tablet Take 100 mg by mouth daily.    [provider]  diazepam (VALIUM) 10 MG tablet Take 10 mg by mouth 4 (four) times daily.    [provider]  docusate sodium (COLACE) 100 MG capsule Take 1 capsule (100 mg total) by mouth 2 (two) times daily. 02/15/18   [name], PA-C  EPINEPHrine (EPI-PEN) 0.3 mg/0.3 mL DEVI Inject 0.3 mg into the muscle once.  06/26/11   Carlisle Cater, PA-C  ferrous sulfate (FERROUSUL) 325 (65 FE) MG tablet Take 1 tablet (325 mg total) by mouth 3 (three) times daily with meals. 02/15/18   [name], PA-C  fluticasone (FLONASE) 50 MCG/ACT nasal spray Place 2 sprays into both nostrils daily as needed for allergies.     [provider]  HYDROmorphone (DILAUDID) 2 MG tablet Take 1-2 tablets (2-4 mg total) by mouth every 4 (four) hours as needed for moderate pain or severe pain. 02/21/18   [name], PA-C  HYDROmorphone (DILAUDID) 2 MG tablet Take 1-2 tablets (2-4 mg total) by mouth every 4 (four) hours as needed for severe pain. 02/15/18   [name], PA-C  lisinopril (PRINIVIL,ZESTRIL) 10 MG tablet Take 10 mg by mouth daily.    [provider]  metFORMIN (GLUCOPHAGE) 1000 MG tablet Take 1,000 mg by mouth 2 (two) times daily with a meal.    [provider]  methocarbamol (ROBAXIN) 500 MG tablet Take 1 tablet (500 mg total) by mouth every 6 (six) hours as needed for muscle spasms. 02/15/18   [name], PA-C  polyethylene glycol (MIRALAX / GLYCOLAX) packet Take 17 g by mouth 2 (two) times daily. 02/15/18   [name], PA-C  QUEtiapine (SEROQUEL) 100 MG tablet Take 100 mg by mouth at bedtime. 10/27/17   [provider]  ranitidine (ZANTAC) 150 MG tablet Take 150 mg by mouth 2 (two) times daily.     [provider]  rOPINIRole (REQUIP) 1 MG tablet Take 1 mg by mouth at bedtime.    [provider]    Family History Family History  Problem Relation Age of Onset  . Lung cancer Mother   . Diabetes Father   . Colon cancer Neg Hx     Social History Social History   Tobacco Use  . Smoking status: Former Smoker    Last  attempt to quit: 01/25/2005    Years since quitting: 13.0  . Smokeless tobacco: Never Used  Substance Use Topics  . Alcohol use: No  . Drug use: No     Allergies   Banana; Latex; Onion; Penicillins; Tylenol [acetaminophen]; Codeine; and Hydrocodone   Review of Systems Review of Systems  Constitutional: Positive for fever.  Respiratory: Positive for shortness of breath. Negative for cough.   Cardiovascular: Positive for leg swelling. Negative for chest pain.  Gastrointestinal: Positive for nausea. Negative for abdominal pain and vomiting.  Musculoskeletal: Positive for arthralgias.  All other systems reviewed and are negative.    Physical Exam  Updated Vital Signs BP 127/86 (BP Location: Left Arm)   Pulse (!) 108   Temp 98.8 F (37.1 C) (Oral)   Resp (!) 28   SpO2 99%   Physical Exam  Constitutional: She is oriented to person, place, and time. She appears well-developed and well-nourished. No distress.  Calm and cooperative. Well appearing. NAD  HENT:  Head: Normocephalic and atraumatic.  Eyes: Pupils are equal, round, and reactive to light. Conjunctivae are normal. Right eye exhibits no discharge. Left eye exhibits no discharge. No scleral icterus.  Neck: Normal range of motion.  Cardiovascular: Regular rhythm. Tachycardia present. Exam reveals no gallop and no friction rub.  No murmur heard. Pulmonary/Chest: Effort normal and breath sounds normal. No respiratory distress.  Abdominal: Soft. Bowel sounds are normal. She exhibits no distension. There is no tenderness.  Musculoskeletal:  Left knee: Diffuse swelling, redness, warmth of the knee. Exquisitely tender. There is a small amount of purulent drainage at the most superior aspect of the surgical incision. There is associated ecchymosis which is around the back of the thigh  Neurological: She is alert and oriented to person, place, and time.  Skin: Skin is warm and dry.  Psychiatric: She has a normal mood and affect.  Her behavior is normal.  Nursing note and vitals reviewed.          ED Treatments / Results  Labs (all labs ordered are listed, but only abnormal results are displayed) Labs Reviewed  BASIC METABOLIC PANEL - Abnormal; Notable for the following components:      Result Value   Glucose, Bld 110 (*)    Calcium 8.5 (*)    All other components within normal limits  CBC WITH DIFFERENTIAL/PLATELET - Abnormal; Notable for the following components:   WBC 11.9 (*)    RBC 3.40 (*)    Hemoglobin 10.2 (*)    HCT 33.1 (*)    Platelets 422 (*)    Lymphs Abs 4.2 (*)    All other components within normal limits  I-STAT TROPONIN, ED    EKG EKG Interpretation  Date/Time:  Sunday February 20 2018 17:10:17 EST Ventricular Rate:  108 PR Interval:    QRS Duration: 98 QT Interval:  344 QTC Calculation: 462 R Axis:   -40 Text Interpretation:  Sinus tachycardia Left axis deviation Borderline T wave abnormalities No significant change since last tracing Confirmed by Lajean Saver [phone]) on 02/20/2018 5:46:37 PM   Radiology Dg Chest 2 View  Result Date: 02/20/2018 CLINICAL DATA:  Short of breath EXAM: CHEST - 2 VIEW COMPARISON:  09/28/2017 FINDINGS: The heart size and mediastinal contours are within normal limits. Both lungs are clear. Scoliosis and degenerative change of the spine. IMPRESSION: No active cardiopulmonary disease. Electronically Signed   By: Donavan Foil M.D.   On: 02/20/2018 18:25    Procedures Procedures (including critical care time)  Medications Ordered in ED Medications  ondansetron (ZOFRAN) injection 4 mg (4 mg Intravenous Given 02/20/18 1830)  HYDROmorphone (DILAUDID) injection 0.5 mg (0.5 mg Intravenous Given 02/20/18 1831)     Initial Impression / Assessment and Plan / ED Course  I have reviewed the triage vital signs and the nursing notes.  Pertinent labs & imaging results that were available during my care of the patient were reviewed by me and  considered in my medical decision making (see chart for details).  52 year old female presents with severe left knee pain and swelling. She is mildly tachycardic. She reports running "low grade" fevers.  Rectal temp here is 100.2. There is a temp of 107 which was charted in error. She is normotensive and not ill appearing. Knee is mildly erythematous, bruised, and swollen. Difficult to tell if this is just post-op changes or not. She also states she has some SOB when asked. No hypoxia and lungs are clear. Will obtain labs, EKG, CXR, and DVT study of her leg since there is low suspicion for DVT at this time.  CBC is remarkable for mild leukocytosis of 11.9 and mild anemia (10.2). BMP is normal. Trop is 0. CXR is negative. EKG is sinus tachycardia and without significant change from prior. DVT study is negative for DVT. Discussed results with patient. She feels better after some pain control. Shared visit with Dr. Ashok Cordia. Will discuss with orthopedics.  Spoke with Dr. Veverly Fells. We will start her on oral antibiotics and give her rx for Zofran and have her f/u in the office on Tuesday. Despite anaphylaxis to PCN Dr. Ashok Cordia is okay with Keflex. She was advised to elevate the leg as much as possible at home. Return precautions given.  Final Clinical Impressions(s) / ED Diagnoses   Final diagnoses:  Pain and swelling of left knee    ED Discharge Orders    None       Recardo Evangelist, PA-C 02/20/18 2024    Recardo Evangelist, PA-C 02/20/18 2024    Lajean Saver, MD 02/21/18 1158

## 2018-02-20 NOTE — Discharge Instructions (Signed)
Take Keflex four times daily for possible infection Take Zofran for nausea as needed Please take your pain medicine and elevate the knee as much as possible to help with pain and swelling Call Dr. Aurea Graff office tomorrow to set up an appointment for Tuesday Return if you are worsening

## 2018-02-20 NOTE — ED Triage Notes (Signed)
Pt reports she had a total knee replacement done Tuesday and was released from the hospital Thursday. Pt reports she started having some swelling to the knee yesterday and this morning she could not move it back as she had been able to and started having SOB. Pt called her tele-doc service who sent her to the ED for evaluation for a DVT and PE.

## 2018-02-20 NOTE — Progress Notes (Signed)
VASCULAR LAB PRELIMINARY  PRELIMINARY  PRELIMINARY  PRELIMINARY  Left lower extremity venous duplex completed.    Preliminary report:  There is no obvious evidence of DVT or SVT noted in the visualized veins in the left lower extremity.  Lunabelle Oatley, RVT 02/20/2018, 7:32 PM

## 2018-02-22 NOTE — Discharge Summary (Signed)
Physician Discharge Summary  Patient ID: [name] MRN: [id number] DOB/AGE: 54-02-1966 54 y.o.  Admit date: 02/15/2018 Discharge date: 02/17/2018   Procedures:  Procedure(s) (LRB): LEFT TOTAL KNEE ARTHROPLASTY (Left)  Attending Physician:  Dr. Paralee Cancel   Admission Diagnoses:   Left knee primary OA / pain  Discharge Diagnoses:  Principal Problem:   S/P left TKA Active Problems:   S/P right TKA  Past Medical History:  Diagnosis Date  . Anxiety   . Arthritis    osteoarthritis-knees  . Asthma   . Breast fibroadenoma, left   . Depression   . Diabetes mellitus without complication ([place])   . 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 GERD (gastroesophageal reflux disease)   . Headache(784.0)    occ. with allergies/sinus issues  . History of colon polyps 04/2016  . History of MRSA infection 2007  . Hypertension    no problems since weight loss -'09  . Multiple food allergies    Latex allergy-respiratory  . Obesity   . Tendonitis   . Vitamin D deficiency    history of    HPI:    [name], 52 y.o. female, has a history of pain and functional disability in the left knee due to arthritis and has failed non-surgical conservative treatments for greater than 12 weeks to include NSAID's and/or analgesics, corticosteriod injections, viscosupplementation injections and activity modification.  Onset of symptoms was gradual, starting 4+ years ago with gradually worsening course since that time. The patient noted prior procedures on the knee to include  arthroscopy and menisectomy on the left knee(s).  Patient currently rates pain in the left knee(s) at 10 out of 10 with activity. Patient has night pain, worsening of pain with activity and weight bearing, pain that interferes with activities of daily living, pain with passive range of motion, crepitus and joint swelling.  Patient has evidence of periarticular  osteophytes and joint space narrowing by imaging studies. There is no active infection.  Risks, benefits and expectations were discussed with the patient.  Risks including but not limited to the risk of anesthesia, blood clots, nerve damage, blood vessel damage, failure of the prosthesis, infection and up to and including death.  Patient understand the risks, benefits and expectations and wishes to proceed with surgery.   PCP: Bernerd Limbo, MD   Discharged Condition: good  Hospital Course:  Patient underwent the above stated procedure on 02/15/2018. Patient tolerated the procedure well and brought to the recovery room in good condition and subsequently to the floor.  POD #1 BP: 148/92 ; Pulse: 109 ; Temp: 98.9 F (37.2 C) ; Resp: 18 Patient reports pain as moderate/severe.  She feels that the oral medication is not fully controlling her pain and that she is still needing the Dilaudid IV.  I have discussed changing her medication to obtain better pain relief.  Plan for discharge tomorrowdue to underlying medical co-morbidities, pain control and need for inpatient therapy to meet goal of being discharged home safely with family/caregiver. Dorsiflexion/plantar flexion intact, incision: dressing C/D/I, no cellulitis present and compartment soft.   LABS  Basename    HGB     10.3  HCT     32.7   POD #2  BP: 127/79 ; Pulse: 91 ; Temp: 97.9 F (36.6 C) ; Resp: 16 Patient reports pain as mild to moderate depending on activity. No events.  Feels that she is doing better and  ready to go home after therapy today. Neurovascular intact and incision: dressing C/D/I.   LABS  Basename    HGB     9.6  HCT     32.0    Discharge Exam: General appearance: alert, cooperative and no distress Extremities: Homans sign is negative, no sign of DVT, no edema, redness or tenderness in the calves or thighs and no ulcers, gangrene or trophic changes  Disposition:  Home with follow up in 2 weeks   Follow-up  Information    Paralee Cancel, MD. Schedule an appointment as soon as possible for a visit in 2 weeks.   Specialty:  Orthopedic Surgery Contact information: [address] [place] [address] [phone]           Discharge Instructions    Call MD / Call 911   Complete by:  As directed    If you experience chest pain or shortness of breath, CALL 911 and be transported to the hospital emergency room.  If you develope a fever above 101 F, pus (white drainage) or increased drainage or redness at the wound, or calf pain, call your surgeon's office.   Change dressing   Complete by:  As directed    Maintain surgical dressing until follow up in the clinic. If the edges start to pull up, may reinforce with tape. If the dressing is no longer working, may remove and cover with gauze and tape, but must keep the area dry and clean.  Call with any questions or concerns.   Constipation Prevention   Complete by:  As directed    Drink plenty of fluids.  Prune juice may be helpful.  You may use a stool softener, such as Colace (over the counter) 100 mg twice a day.  Use MiraLax (over the counter) for constipation as needed.   Diet - low sodium heart healthy   Complete by:  As directed    Discharge instructions   Complete by:  As directed    Maintain surgical dressing until follow up in the clinic. If the edges start to pull up, may reinforce with tape. If the dressing is no longer working, may remove and cover with gauze and tape, but must keep the area dry and clean.  Follow up in 2 weeks at [facility]. Call with any questions or concerns.   Increase activity slowly as tolerated   Complete by:  As directed    Weight bearing as tolerated with assist device (walker, cane, etc) as directed, use it as long as suggested by your surgeon or therapist, typically at least 4-6 weeks.   TED hose   Complete by:  As directed    Use stockings (TED hose) for 2 weeks on both leg(s).   You may remove them at night for sleeping.      Allergies as of 02/17/2018      Reactions   Banana Shortness Of Breath, Swelling   Latex Shortness Of Breath   Onion Shortness Of Breath, Swelling   Penicillins Anaphylaxis, Swelling, Other (See Comments)   Has patient had a PCN reaction causing immediate rash, facial/tongue/throat swelling, SOB or lightheadedness with hypotension: Yes Has patient had a PCN reaction causing severe rash involving mucus membranes or skin necrosis: No Has patient had a PCN reaction that required hospitalization: Yes - inpatient Has patient had a PCN reaction occurring within the last 10 years: No If all of the above answers are "NO", then may proceed with Cephalosporin use.  Tylenol [acetaminophen] Other (See Comments)   Throat swelling    Codeine Swelling   Hydrocodone Hives, Swelling      Medication List    STOP taking these medications   aspirin EC 81 MG tablet Replaced by:  aspirin 81 MG chewable tablet   cyclobenzaprine 10 MG tablet Commonly known as:  FLEXERIL     TAKE these medications   albuterol 108 (90 Base) MCG/ACT inhaler Commonly known as:  PROVENTIL HFA;VENTOLIN HFA Inhale 2 puffs into the lungs every 6 (six) hours as needed for wheezing or shortness of breath.   aspirin 81 MG chewable tablet Chew 1 tablet (81 mg total) by mouth 2 (two) times daily. Take for 4 weeks, then resume regular dose. Replaces:  aspirin EC 81 MG tablet   atorvastatin 10 MG tablet Commonly known as:  LIPITOR Take 10 mg by mouth at bedtime.   BREO ELLIPTA 200-25 MCG/INH Aepb Generic drug:  fluticasone furoate-vilanterol Inhale 1 puff into the lungs daily.   busPIRone 15 MG tablet Commonly known as:  BUSPAR Take 15 mg by mouth 3 (three) times daily.   desvenlafaxine 100 MG 24 hr tablet Commonly known as:  PRISTIQ Take 100 mg by mouth daily.   diazepam 10 MG tablet Commonly known as:  VALIUM Take 10 mg by mouth 4 (four) times daily.   docusate  sodium 100 MG capsule Commonly known as:  COLACE Take 1 capsule (100 mg total) by mouth 2 (two) times daily.   EPINEPHrine 0.3 mg/0.3 mL Devi Commonly known as:  EPI-PEN Inject 0.3 mg into the muscle once.   ferrous sulfate 325 (65 FE) MG tablet Take 1 tablet (325 mg total) by mouth 3 (three) times daily with meals.   fluticasone 50 MCG/ACT nasal spray Commonly known as:  FLONASE Place 2 sprays into both nostrils daily as needed for allergies.   HYDROmorphone 2 MG tablet Commonly known as:  DILAUDID Take 1-2 tablets (2-4 mg total) by mouth every 4 (four) hours as needed for moderate pain or severe pain.   lisinopril 10 MG tablet Commonly known as:  PRINIVIL,ZESTRIL Take 10 mg by mouth daily.   metFORMIN 1000 MG tablet Commonly known as:  GLUCOPHAGE Take 1,000 mg by mouth 2 (two) times daily with a meal.   methocarbamol 500 MG tablet Commonly known as:  ROBAXIN Take 1 tablet (500 mg total) by mouth every 6 (six) hours as needed for muscle spasms.   QUEtiapine 100 MG tablet Commonly known as:  SEROQUEL Take 100 mg by mouth at bedtime.   ranitidine 150 MG tablet Commonly known as:  ZANTAC Take 150 mg by mouth 2 (two) times daily.   rOPINIRole 1 MG tablet Commonly known as:  REQUIP Take 1 mg by mouth at bedtime.            Discharge Care Instructions  (From admission, onward)         Start     Ordered   02/17/18 0000  Change dressing    Comments:  Maintain surgical dressing until follow up in the clinic. If the edges start to pull up, may reinforce with tape. If the dressing is no longer working, may remove and cover with gauze and tape, but must keep the area dry and clean.  Call with any questions or concerns.   02/17/18 1404           Signed: West Pugh. Kensington Duerst   PA-C  02/22/2018, 1:19 PM

## 2018-04-19 DIAGNOSIS — G4733 Obstructive sleep apnea (adult) (pediatric): Secondary | ICD-10-CM | POA: Insufficient documentation

## 2018-04-19 DIAGNOSIS — Z9989 Dependence on other enabling machines and devices: Secondary | ICD-10-CM | POA: Insufficient documentation

## 2018-09-22 ENCOUNTER — Other Ambulatory Visit: Payer: Self-pay

## 2018-09-22 ENCOUNTER — Encounter: Payer: Self-pay | Admitting: Cardiology

## 2018-09-22 ENCOUNTER — Ambulatory Visit (INDEPENDENT_AMBULATORY_CARE_PROVIDER_SITE_OTHER): Payer: Medicare Other | Admitting: Cardiology

## 2018-09-22 VITALS — BP 120/84 | HR 99 | Ht 59.5 in | Wt 214.5 lb

## 2018-09-22 DIAGNOSIS — R0609 Other forms of dyspnea: Secondary | ICD-10-CM | POA: Diagnosis not present

## 2018-09-22 DIAGNOSIS — I10 Essential (primary) hypertension: Secondary | ICD-10-CM

## 2018-09-22 DIAGNOSIS — Q211 Atrial septal defect: Secondary | ICD-10-CM

## 2018-09-22 DIAGNOSIS — E119 Type 2 diabetes mellitus without complications: Secondary | ICD-10-CM

## 2018-09-22 DIAGNOSIS — Q2112 Patent foramen ovale: Secondary | ICD-10-CM

## 2018-09-22 NOTE — Patient Instructions (Signed)
Medication Instructions:  Your physician recommends that you continue on your current medications as directed. Please refer to the Current Medication list given to you today.  If you need a refill on your cardiac medications before your next appointment, please call your pharmacy.   Lab work: None.  If you have labs (blood work) drawn today and your tests are completely normal, you will receive your results only by:  [place] (if you have MyChart) OR  A paper copy in the mail If you have any lab test that is abnormal or we need to change your treatment, we will call you to review the results.  Testing/Procedures: Your physician has recommended that you wear an event monitor. Event monitors are medical devices that record the hearts electrical activity. Doctors most often [place] these monitors to diagnose arrhythmias. Arrhythmias are problems with the speed or rhythm of the heartbeat. The monitor is a small, portable device. You can wear one while you do your normal daily activities. This is usually used to diagnose what is causing palpitations/syncope (passing out). Wear for 30 days.   Your physician has requested that you have an echocardiogram. Echocardiography is a painless test that uses sound waves to create images of your heart. It provides your doctor with information about the size and shape of your heart and how well your hearts chambers and valves are working. This procedure takes approximately one hour. There are no restrictions for this procedure.      Follow-Up: At [facility], you and your health needs are our priority.  As part of our continuing mission to provide you with exceptional heart care, we have created designated Provider Care Teams.  These Care Teams include your primary Cardiologist (physician) and Advanced Practice Providers (APPs -  Physician Assistants and Nurse Practitioners) who all work together to provide you with the care you need, when you need  it. You will need a follow up appointment in 6 weeks.  Please call our office 2 months in advance to schedule this appointment.  You may see No primary care provider on file. or another member of our [employer] Provider Team in [place]: Shirlee More, MD  Jyl Heinz, MD  Any Other Special Instructions Will Be Listed Below (If Applicable).   Echocardiogram An echocardiogram is a procedure that uses painless sound waves (ultrasound) to produce an image of the heart. Images from an echocardiogram can provide important information about:  Signs of coronary artery disease (CAD).  Aneurysm detection. An aneurysm is a weak or damaged part of an artery wall that bulges out from the normal force of blood pumping through the body.  Heart size and shape. Changes in the size or shape of the heart can be associated with certain conditions, including heart failure, aneurysm, and CAD.  Heart muscle function.  Heart valve function.  Signs of a past heart attack.  Fluid buildup around the heart.  Thickening of the heart muscle.  A tumor or infectious growth around the heart valves. Tell a health care provider about:  Any allergies you have.  All medicines you are taking, including vitamins, herbs, eye drops, creams, and over-the-counter medicines.  Any blood disorders you have.  Any surgeries you have had.  Any medical conditions you have.  Whether you are pregnant or may be pregnant. What are the risks? Generally, this is a safe procedure. However, problems may occur, including:  Allergic reaction to dye (contrast) that may be used during the procedure. What happens before  the procedure? No specific preparation is needed. You may eat and drink normally. What happens during the procedure?   An IV tube may be inserted into one of your veins.  You may receive contrast through this tube. A contrast is an injection that improves the quality of the pictures from your  heart.  A gel will be applied to your chest.  A wand-like tool (transducer) will be moved over your chest. The gel will help to transmit the sound waves from the transducer.  The sound waves will harmlessly bounce off of your heart to allow the heart images to be captured in real-time motion. The images will be recorded on a computer. The procedure may vary among health care providers and hospitals. What happens after the procedure?  You may return to your normal, everyday life, including diet, activities, and medicines, unless your health care provider tells you not to do that. Summary  An echocardiogram is a procedure that uses painless sound waves (ultrasound) to produce an image of the heart.  Images from an echocardiogram can provide important information about the size and shape of your heart, heart muscle function, heart valve function, and fluid buildup around your heart.  You do not need to do anything to prepare before this procedure. You may eat and drink normally.  After the echocardiogram is completed, you may return to your normal, everyday life, unless your health care provider tells you not to do that. This information is not intended to replace advice given to you by your health care provider. Make sure you discuss any questions you have with your health care provider. Document Released: 03/20/2000 Document Revised: 04/25/2016 Document Reviewed: 04/25/2016 Elsevier Interactive Patient Education  2019 [employer].

## 2018-09-22 NOTE — Progress Notes (Signed)
Cardiology Consultation:    Date:  09/22/2018   ID:  [name], DOB 1965/12/22, MRN [id number]  PCP:  Bernerd Limbo, MD  Cardiologist:  Jenne Campus, MD   Referring MD: Gardiner Rhyme, MD   Chief Complaint  Patient presents with  . Shortness of Breath  I have shortness of breath  History of Present Illness:    [name] is a 53 y.o. female who is being seen today for the evaluation of shortness of breath at the request of Chodri, Nancy Nordmann, MD.  Apparently years ago she had echocardiogram done in [facility] echocardiogram showed PFO she was told not to worry about and everything is fine, however, within last few months she experienced gradually increasing shortness of breath with exertion.  She try to stay healthy she try to exercise on regular basis she is afraid to go to public places because of coronavirus situation but she try to walk around especially in the garden.  When she compare herself with herself 6 months ago is clearly deterioration in her condition.  There is no chest pain tightness squeezing pressure burning chest just shortness of breath with exertion.  She does have sleep apnea and she does use CPAP mask.  She does not have paroxysmal nocturnal dyspnea.  She also described to have few episode of syncope in her life last episode happened about 6 months ago she was standing at the kitchen counter where, became dizzy and her husband have to catch her otherwise she will hit the floor.  She had another episode about 6 months before similar situation quite abrupt onset.  She does have family history of coronary artery disease in some family members having some congenital problem.  She is not sure exactly what of the details.  She notes also that her uncle got atrial fibrillation and not having stroke because of this and she is absolutely terrified of that. Quit smoking years ago Risk factors for coronary artery disease include hypertension dyslipidemia She is morbidly  obese Does have sleep apnea  Past Medical History:  Diagnosis Date  . Anxiety   . Arthritis    osteoarthritis-knees  . Asthma   . Breast fibroadenoma, left   . Depression   . Diabetes mellitus without complication ([place])   . 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 GERD (gastroesophageal reflux disease)   . Headache(784.0)    occ. with allergies/sinus issues  . History of colon polyps 04/2016  . History of MRSA infection 2007  . Hypertension    no problems since weight loss -'09  . Multiple food allergies    Latex allergy-respiratory  . Obesity   . Tendonitis   . Vitamin D deficiency    history of    Past Surgical History:  Procedure Laterality Date  . ABDOMINAL HYSTERECTOMY    . BOWEL RESECTION    . CESAREAN SECTION     x4  . COLONOSCOPY W/ POLYPECTOMY  04/2016  . KNEE SURGERY     Right knee scope 8'12  . TOTAL KNEE ARTHROPLASTY Right 01/31/2013   Procedure: RIGHT TOTAL KNEE ARTHROPLASTY;  Surgeon: [name], MD;  Location: [facility];  Service: Orthopedics;  Laterality: Right;  . TOTAL KNEE ARTHROPLASTY Left 02/15/2018   Procedure: LEFT TOTAL KNEE ARTHROPLASTY;  Surgeon: Paralee Cancel, MD;  Location: [facility];  Service: Orthopedics;  Laterality: Left;  68    Current Medications: Current Meds  Medication Sig  .  albuterol (PROVENTIL HFA;VENTOLIN HFA) 108 (90 BASE) MCG/ACT inhaler Inhale 2 puffs into the lungs every 6 (six) hours as needed for wheezing or shortness of breath.  Marland Kitchen atorvastatin (LIPITOR) 10 MG tablet Take 10 mg by mouth at bedtime.  Marland Kitchen BREO ELLIPTA 200-25 MCG/INH AEPB Inhale 1 puff into the lungs daily.  . busPIRone (BUSPAR) 15 MG tablet Take 15 mg by mouth 3 (three) times daily.  Marland Kitchen desvenlafaxine (PRISTIQ) 100 MG 24 hr tablet Take 100 mg by mouth daily.  . diazepam (VALIUM) 10 MG tablet Take 10 mg by mouth 4 (four) times daily.  Marland Kitchen docusate sodium (COLACE) 100 MG capsule Take 1 capsule (100 mg  total) by mouth 2 (two) times daily.  Marland Kitchen EPINEPHrine (EPI-PEN) 0.3 mg/0.3 mL DEVI Inject 0.3 mg into the muscle once.   . fluticasone (FLONASE) 50 MCG/ACT nasal spray Place 2 sprays into both nostrils daily as needed for allergies.   Marland Kitchen lisinopril (PRINIVIL,ZESTRIL) 10 MG tablet Take 10 mg by mouth daily.  . metFORMIN (GLUCOPHAGE) 1000 MG tablet Take 1,000 mg by mouth 2 (two) times daily with a meal.  . methocarbamol (ROBAXIN) 500 MG tablet Take 1 tablet (500 mg total) by mouth every 6 (six) hours as needed for muscle spasms.  . montelukast (SINGULAIR) 10 MG tablet Take 10 mg by mouth daily.  . QUEtiapine (SEROQUEL) 100 MG tablet Take 100 mg by mouth at bedtime.  Marland Kitchen rOPINIRole (REQUIP) 1 MG tablet Take 1 mg by mouth at bedtime.  . Wheat Dextrin (BENEFIBER DRINK MIX PO) Take 3 Packages by mouth daily.     Allergies:   Banana, Latex, Onion, Penicillins, Tylenol [acetaminophen], Codeine, Hydrocodone, and Insect wax   Social History   Socioeconomic History  . Marital status: Married    Spouse name: antonio  . Number of children: 6  . Years of education: college  . Highest education level: Not on file  Occupational History  . Occupation: N/A  Social Needs  . Financial resource strain: Not on file  . Food insecurity    Worry: Not on file    Inability: Not on file  . Transportation needs    Medical: Not on file    Non-medical: Not on file  Tobacco Use  . Smoking status: Former Smoker    Quit date: 01/25/2005    Years since quitting: 13.6  . Smokeless tobacco: Never Used  Substance and Sexual Activity  . Alcohol use: No  . Drug use: No  . Sexual activity: Yes    Partners: Male  Lifestyle  . Physical activity    Days per week: Not on file    Minutes per session: Not on file  . Stress: Not on file  Relationships  . Social Herbalist on phone: Not on file    Gets together: Not on file    Attends religious service: Not on file    Active member of club or organization:  Not on file    Attends meetings of clubs or organizations: Not on file    Relationship status: Not on file  Other Topics Concern  . Not on file  Social History Narrative  . Not on file     Family History: The patient's family history includes Diabetes in her father; Lung cancer in her mother. There is no history of Colon cancer. ROS:   Please see the history of present illness.    All 14 point review of systems negative except as described per history of  present illness.  EKGs/Labs/Other Studies Reviewed:    The following studies were reviewed today:   Recent Labs: 02/20/2018: BUN 8; Creatinine, Ser 0.61; Hemoglobin 10.2; Platelets 422; Potassium 4.2; Sodium 138  Recent Lipid Panel    Component Value Date/Time   CHOL 173 11/27/2012 0500   TRIG 128 11/27/2012 0500   HDL 49 11/27/2012 0500   CHOLHDL 3.5 11/27/2012 0500   VLDL 26 11/27/2012 0500   LDLCALC 98 11/27/2012 0500    Physical Exam:    VS:  BP 120/84   Pulse 99   Ht 4' 11.5" (1.511 m)   Wt 214 lb 8 oz (97.3 kg)   SpO2 98%   BMI 42.60 kg/m     Wt Readings from Last 3 Encounters:  09/22/18 214 lb 8 oz (97.3 kg)  02/15/18 213 lb (96.6 kg)  02/08/18 213 lb (96.6 kg)     GEN:  Well nourished, well developed in no acute distress HEENT: Normal NECK: No JVD; No carotid bruits LYMPHATICS: No lymphadenopathy CARDIAC: RRR, no murmurs, no rubs, no gallops RESPIRATORY:  Clear to auscultation without rales, wheezing or rhonchi  ABDOMEN: Soft, non-tender, non-distended MUSCULOSKELETAL:  No edema; No deformity  SKIN: Warm and dry NEUROLOGIC:  Alert and oriented x 3 PSYCHIATRIC:  Normal affect   ASSESSMENT:    1. Essential hypertension   2. Dyspnea on exertion   3. PFO (patent foramen ovale)   4. Type 2 diabetes mellitus without complication, without long-term current use of insulin (HCC)    PLAN:    In order of problems listed above:  1. Dyspnea on exertion obviously concerning.  She does have a PFO and  think that that may be related to it however PFO usually is hemodynamically insignificant to cause problem like that.  I will schedule her to have echocardiogram second look at the left atrial size by ventricle size and function as well as PFO.  I will not initiate any medication to have clear situation was going on.  She tells me that about a year ago she had a cardiac catheterization done with apparently which was normal she told me that it was done in [facility] however have no documentation for it I cannot find it. 2. Patent foramen ovale most likely incidental finding and most likely insignificant but echocardiogram will be done to assess the situation 3. Diabetes mellitus she is taking very good care of it.  Her hemoglobin A1c is in the neighborhood of 6.  I congratulated her and encouraged her to do that. 4. Dizziness with episode of near syncope.  I will ask her to wear 30 days event recorder.  Echocardiogram will be done as well to assess left ventricular ejection fraction.   Medication Adjustments/Labs and Tests Ordered: Current medicines are reviewed at length with the patient today.  Concerns regarding medicines are outlined above.  No orders of the defined types were placed in this encounter.  No orders of the defined types were placed in this encounter.   Signed, Park Liter, MD, [facility]. 09/22/2018 1:43 PM    [facility]

## 2018-09-27 ENCOUNTER — Telehealth: Payer: Self-pay | Admitting: *Deleted

## 2018-09-27 NOTE — Telephone Encounter (Signed)
Pt is calling to say she has not gotten the monitor that Dr. Michela Pitcher she needs to be wearing in the mail yet. Please advise.

## 2018-09-27 NOTE — Telephone Encounter (Signed)
Called patient back and informed her monitor is being worked on and will be sent. Patient verbally understands.

## 2018-10-06 ENCOUNTER — Encounter (INDEPENDENT_AMBULATORY_CARE_PROVIDER_SITE_OTHER): Payer: Medicare Other

## 2018-10-06 DIAGNOSIS — R0609 Other forms of dyspnea: Secondary | ICD-10-CM | POA: Diagnosis not present

## 2018-10-06 DIAGNOSIS — Q211 Atrial septal defect: Secondary | ICD-10-CM | POA: Diagnosis not present

## 2018-10-06 DIAGNOSIS — I10 Essential (primary) hypertension: Secondary | ICD-10-CM | POA: Diagnosis not present

## 2018-11-03 ENCOUNTER — Ambulatory Visit (INDEPENDENT_AMBULATORY_CARE_PROVIDER_SITE_OTHER): Payer: Medicare Other

## 2018-11-03 ENCOUNTER — Ambulatory Visit: Payer: Medicare Other | Admitting: Cardiology

## 2018-11-03 ENCOUNTER — Other Ambulatory Visit: Payer: Self-pay

## 2018-11-03 DIAGNOSIS — R0609 Other forms of dyspnea: Secondary | ICD-10-CM | POA: Diagnosis not present

## 2018-11-03 DIAGNOSIS — Q211 Atrial septal defect: Secondary | ICD-10-CM

## 2018-11-03 NOTE — Progress Notes (Signed)
Complete echocardiogram has been performed.  Jimmy Lorelai Huyser RDCS, RVT 

## 2018-11-04 ENCOUNTER — Encounter: Payer: Self-pay | Admitting: Cardiology

## 2018-11-04 ENCOUNTER — Telehealth (INDEPENDENT_AMBULATORY_CARE_PROVIDER_SITE_OTHER): Payer: Medicare Other | Admitting: Cardiology

## 2018-11-04 VITALS — BP 132/88 | HR 107 | Wt 212.0 lb

## 2018-11-04 DIAGNOSIS — Q211 Atrial septal defect: Secondary | ICD-10-CM

## 2018-11-04 DIAGNOSIS — I10 Essential (primary) hypertension: Secondary | ICD-10-CM

## 2018-11-04 DIAGNOSIS — Q2112 Patent foramen ovale: Secondary | ICD-10-CM

## 2018-11-04 DIAGNOSIS — E119 Type 2 diabetes mellitus without complications: Secondary | ICD-10-CM

## 2018-11-04 NOTE — Patient Instructions (Signed)
Medication Instructions:  Your physician recommends that you continue on your current medications as directed. Please refer to the Current Medication list given to you today.  If you need a refill on your cardiac medications before your next appointment, please call your pharmacy.   Lab work: None ordered If you have labs (blood work) drawn today and your tests are completely normal, you will receive your results only by: Marland Kitchen MyChart Message (if you have MyChart) OR . A paper copy in the mail If you have any lab test that is abnormal or we need to change your treatment, we will call you to review the results.  Testing/Procedures: None ordered  Follow-Up: At [facility], you and your health needs are our priority.  As part of our continuing mission to provide you with exceptional heart care, we have created designated Provider Care Teams.  These Care Teams include your primary Cardiologist (physician) and Advanced Practice Providers (APPs -  Physician Assistants and Nurse Practitioners) who all work together to provide you with the care you need, when you need it. You will need a follow up appointment in 2 months. You may see Jenne Campus or another member of our [employer] Provider Team in [place]: Shirlee More, MD . Jyl Heinz, MD

## 2018-11-04 NOTE — Progress Notes (Signed)
Virtual Visit via Telephone Note   This visit type was conducted due to national recommendations for restrictions regarding the COVID-19 Pandemic (e.g. social distancing) in an effort to limit this patient's exposure and mitigate transmission in our community.  Due to her co-morbid illnesses, this patient is at least at moderate risk for complications without adequate follow up.  This format is felt to be most appropriate for this patient at this time.  The patient did not have access to video technology/had technical difficulties with video requiring transitioning to audio format only (telephone).  All issues noted in this document were discussed and addressed.  No physical exam could be performed with this format.  Please refer to the patient's chart for her  consent to telehealth for [facility].  Evaluation Performed:  Follow-up visit  This visit type was conducted due to national recommendations for restrictions regarding the COVID-19 Pandemic (e.g. social distancing).  This format is felt to be most appropriate for this patient at this time.  All issues noted in this document were discussed and addressed.  No physical exam was performed (except for noted visual exam findings with Video Visits).  Please refer to the patient's chart (MyChart message for video visits and phone note for telephone visits) for the patient's consent to telehealth for [facility].  Date:  11/04/2018  ID: [name], DOB 11/22/1965, MRN [id number]   Patient Location: [place] [address]   Provider location:   [place] Office  PCP:  Bernerd Limbo, MD  Cardiologist:  Jenne Campus, MD     Chief Complaint: Doing somewhat better  History of Present Illness:    [name] is a 53 y.o. female  who presents via audio/video conferencing for a telehealth visit today.  shortness of breath at the request of Chodri, Nancy Nordmann, MD.  Apparently years ago she had echocardiogram  done in [facility] echocardiogram showed PFO she was told not to worry about and everything is fine, however, within last few months she experienced gradually increasing shortness of breath with exertion.  She try to stay healthy she try to exercise on regular basis she is afraid to go to public places because of coronavirus situation but she try to walk around especially in the garden.  When she compare herself with herself 6 months ago is clearly deterioration in her condition.  There is no chest pain tightness squeezing pressure burning chest just shortness of breath with exertion.  She does have sleep apnea and she does use CPAP mask.  She does not have paroxysmal nocturnal dyspnea.  She also described to have few episode of syncope in her life last episode happened about 6 months ago she was standing at the kitchen counter where, became dizzy and her husband have to catch her otherwise she will hit the floor.  She had another episode about 6 months before similar situation quite abrupt onset.  She does have family history of coronary artery disease in some family members having some congenital problem.  She is not sure exactly what of the details.  She notes also that her uncle got atrial fibrillation and not having stroke because of this and she is absolutely terrified of that.   Today she has follow-up office visit.  Overall doing well still complaining of having some palpitation dizziness she actually just finished wearing monitor.  She is waiting for [employer] to pick it up.  When she wear monitor she did have episode of  dizziness palpitations as well as shortness of breath.  I am anxiously waiting to see results of it.   The patient does not have symptoms concerning for COVID-19 infection (fever, chills, cough, or new SHORTNESS OF BREATH).    Prior CV studies:   The following studies were reviewed today:  Echocardiogram done which showed normal left ventricular ejection fraction no PFO noted on  this study.     Past Medical History:  Diagnosis Date  . Anxiety   . Arthritis    osteoarthritis-knees  . Asthma   . Breast fibroadenoma, left   . Depression   . Diabetes mellitus without complication ([place])   . 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 GERD (gastroesophageal reflux disease)   . Headache(784.0)    occ. with allergies/sinus issues  . History of colon polyps 04/2016  . History of MRSA infection 2007  . Hypertension    no problems since weight loss -'09  . Multiple food allergies    Latex allergy-respiratory  . Obesity   . Tendonitis   . Vitamin D deficiency    history of    Past Surgical History:  Procedure Laterality Date  . ABDOMINAL HYSTERECTOMY    . BOWEL RESECTION    . CESAREAN SECTION     x4  . COLONOSCOPY W/ POLYPECTOMY  04/2016  . KNEE SURGERY     Right knee scope 8'12  . TOTAL KNEE ARTHROPLASTY Right 01/31/2013   Procedure: RIGHT TOTAL KNEE ARTHROPLASTY;  Surgeon: [name], MD;  Location: [facility];  Service: Orthopedics;  Laterality: Right;  . TOTAL KNEE ARTHROPLASTY Left 02/15/2018   Procedure: LEFT TOTAL KNEE ARTHROPLASTY;  Surgeon: Paralee Cancel, MD;  Location: [facility];  Service: Orthopedics;  Laterality: Left;  70     Current Meds  Medication Sig  . albuterol (PROVENTIL HFA;VENTOLIN HFA) 108 (90 BASE) MCG/ACT inhaler Inhale 2 puffs into the lungs every 6 (six) hours as needed for wheezing or shortness of breath.  Marland Kitchen atorvastatin (LIPITOR) 10 MG tablet Take 10 mg by mouth at bedtime.  Marland Kitchen BREO ELLIPTA 200-25 MCG/INH AEPB Inhale 1 puff into the lungs daily.  . busPIRone (BUSPAR) 15 MG tablet Take 15 mg by mouth 3 (three) times daily.  Marland Kitchen desvenlafaxine (PRISTIQ) 100 MG 24 hr tablet Take 100 mg by mouth daily.  . diazepam (VALIUM) 10 MG tablet Take 10 mg by mouth 4 (four) times daily.  Marland Kitchen docusate sodium (COLACE) 100 MG capsule Take 1 capsule (100 mg total) by mouth 2 (two) times  daily.  Marland Kitchen EPINEPHrine (EPI-PEN) 0.3 mg/0.3 mL DEVI Inject 0.3 mg into the muscle once.   . fluticasone (FLONASE) 50 MCG/ACT nasal spray Place 2 sprays into both nostrils daily as needed for allergies.   Marland Kitchen glipiZIDE (GLUCOTROL XL) 10 MG 24 hr tablet Take 1 tablet by mouth daily.  Marland Kitchen lisinopril (PRINIVIL,ZESTRIL) 10 MG tablet Take 10 mg by mouth daily.  . methocarbamol (ROBAXIN) 500 MG tablet Take 1 tablet (500 mg total) by mouth every 6 (six) hours as needed for muscle spasms.  . montelukast (SINGULAIR) 10 MG tablet Take 10 mg by mouth daily.  Marland Kitchen OZEMPIC, 0.25 OR 0.5 MG/DOSE, 2 MG/1.5ML SOPN INJECT 0.19 mls (0.25 MG) into THE SKIN ONCE a WEEK FOR 30 DAYS  . QUEtiapine (SEROQUEL) 100 MG tablet Take 100 mg by mouth at bedtime.  Marland Kitchen rOPINIRole (REQUIP) 1 MG tablet Take 1 mg by mouth at  bedtime.  . Wheat Dextrin (BENEFIBER DRINK MIX PO) Take 3 Packages by mouth daily.      Family History: The patient's family history includes Diabetes in her father; Lung cancer in her mother. There is no history of Colon cancer.   ROS:   Please see the history of present illness.     All other systems reviewed and are negative.   Labs/Other Tests and Data Reviewed:     Recent Labs: 02/20/2018: BUN 8; Creatinine, Ser 0.61; Hemoglobin 10.2; Platelets 422; Potassium 4.2; Sodium 138  Recent Lipid Panel    Component Value Date/Time   CHOL 173 11/27/2012 0500   TRIG 128 11/27/2012 0500   HDL 49 11/27/2012 0500   CHOLHDL 3.5 11/27/2012 0500   VLDL 26 11/27/2012 0500   LDLCALC 98 11/27/2012 0500      Exam:    Vital Signs:  BP 132/88   Pulse (!) 107   Wt 212 lb (96.2 kg)   SpO2 97%   BMI 42.10 kg/m     Wt Readings from Last 3 Encounters:  11/04/18 212 lb (96.2 kg)  09/22/18 214 lb 8 oz (97.3 kg)  02/15/18 213 lb (96.6 kg)     Well nourished, well developed in no acute distress. Alert awake and attentive 3 talking to me over the phone.  Unable to establish video link.  Diagnosis for this  visit:   1. PFO (patent foramen ovale)   2. Essential hypertension   3. Type 2 diabetes mellitus without complication, without long-term current use of insulin ([place])      ASSESSMENT & PLAN:    1.  Patent foramen ovale.  Insignificant finding most likely.  We will continue monitoring. 2.  Essential hypertension blood pressure appears to be controlled continue present management. 3.  Diabetes mellitus followed by 10 medicine team. 4.  Palpitations with dizziness awaiting results of her monitor.  COVID-19 Education: The signs and symptoms of COVID-19 were discussed with the patient and how to seek care for testing (follow up with PCP or arrange E-visit).  The importance of social distancing was discussed today.  Patient Risk:   After full review of this patients clinical status, I feel that they are at least moderate risk at this time.  Time:   Today, I have spent 15 minutes with the patient with telehealth technology discussing pt health issues.  I spent 5 minutes reviewing her chart before the visit.  Visit was finished at 1:27 PM.    Medication Adjustments/Labs and Tests Ordered: Current medicines are reviewed at length with the patient today.  Concerns regarding medicines are outlined above.  No orders of the defined types were placed in this encounter.  Medication changes: No orders of the defined types were placed in this encounter.    Disposition: Follow-up in 2 months  Signed, Park Liter, MD, [facility] 11/04/2018 1:26 PM    [place]

## 2019-02-08 ENCOUNTER — Emergency Department (HOSPITAL_COMMUNITY): Payer: Medicare Other

## 2019-02-08 ENCOUNTER — Inpatient Hospital Stay (HOSPITAL_COMMUNITY)
Admission: EM | Admit: 2019-02-08 | Discharge: 2019-02-13 | DRG: 287 | Disposition: A | Discharge status: Home / Self Care | Payer: Medicare Other | Attending: Internal Medicine | Admitting: Internal Medicine

## 2019-02-08 ENCOUNTER — Encounter (HOSPITAL_COMMUNITY): Payer: Self-pay | Admitting: Family Medicine

## 2019-02-08 DIAGNOSIS — Z96653 Presence of artificial knee joint, bilateral: Secondary | ICD-10-CM | POA: Diagnosis present

## 2019-02-08 DIAGNOSIS — K219 Gastro-esophageal reflux disease without esophagitis: Secondary | ICD-10-CM | POA: Diagnosis present

## 2019-02-08 DIAGNOSIS — Q211 Atrial septal defect: Secondary | ICD-10-CM

## 2019-02-08 DIAGNOSIS — R55 Syncope and collapse: Secondary | ICD-10-CM

## 2019-02-08 DIAGNOSIS — Z6841 Body Mass Index (BMI) 40.0 and over, adult: Secondary | ICD-10-CM

## 2019-02-08 DIAGNOSIS — I10 Essential (primary) hypertension: Secondary | ICD-10-CM | POA: Diagnosis present

## 2019-02-08 DIAGNOSIS — F329 Major depressive disorder, single episode, unspecified: Secondary | ICD-10-CM | POA: Diagnosis present

## 2019-02-08 DIAGNOSIS — F419 Anxiety disorder, unspecified: Secondary | ICD-10-CM | POA: Diagnosis present

## 2019-02-08 DIAGNOSIS — I25110 Atherosclerotic heart disease of native coronary artery with unstable angina pectoris: Secondary | ICD-10-CM | POA: Diagnosis not present

## 2019-02-08 DIAGNOSIS — E1159 Type 2 diabetes mellitus with other circulatory complications: Secondary | ICD-10-CM | POA: Diagnosis present

## 2019-02-08 DIAGNOSIS — Z79899 Other long term (current) drug therapy: Secondary | ICD-10-CM

## 2019-02-08 DIAGNOSIS — Z7984 Long term (current) use of oral hypoglycemic drugs: Secondary | ICD-10-CM

## 2019-02-08 DIAGNOSIS — R112 Nausea with vomiting, unspecified: Secondary | ICD-10-CM | POA: Diagnosis present

## 2019-02-08 DIAGNOSIS — E785 Hyperlipidemia, unspecified: Secondary | ICD-10-CM | POA: Diagnosis present

## 2019-02-08 DIAGNOSIS — Z91040 Latex allergy status: Secondary | ICD-10-CM

## 2019-02-08 DIAGNOSIS — I200 Unstable angina: Secondary | ICD-10-CM

## 2019-02-08 DIAGNOSIS — E11649 Type 2 diabetes mellitus with hypoglycemia without coma: Secondary | ICD-10-CM | POA: Diagnosis present

## 2019-02-08 DIAGNOSIS — Z801 Family history of malignant neoplasm of trachea, bronchus and lung: Secondary | ICD-10-CM

## 2019-02-08 DIAGNOSIS — Z87891 Personal history of nicotine dependence: Secondary | ICD-10-CM

## 2019-02-08 DIAGNOSIS — E162 Hypoglycemia, unspecified: Secondary | ICD-10-CM

## 2019-02-08 DIAGNOSIS — J45909 Unspecified asthma, uncomplicated: Secondary | ICD-10-CM | POA: Diagnosis present

## 2019-02-08 DIAGNOSIS — Z90710 Acquired absence of both cervix and uterus: Secondary | ICD-10-CM

## 2019-02-08 DIAGNOSIS — Z7982 Long term (current) use of aspirin: Secondary | ICD-10-CM

## 2019-02-08 DIAGNOSIS — Z7951 Long term (current) use of inhaled steroids: Secondary | ICD-10-CM

## 2019-02-08 DIAGNOSIS — Z880 Allergy status to penicillin: Secondary | ICD-10-CM

## 2019-02-08 DIAGNOSIS — Z885 Allergy status to narcotic agent status: Secondary | ICD-10-CM

## 2019-02-08 DIAGNOSIS — Z20828 Contact with and (suspected) exposure to other viral communicable diseases: Secondary | ICD-10-CM | POA: Diagnosis present

## 2019-02-08 DIAGNOSIS — R079 Chest pain, unspecified: Secondary | ICD-10-CM | POA: Diagnosis not present

## 2019-02-08 DIAGNOSIS — R9439 Abnormal result of other cardiovascular function study: Secondary | ICD-10-CM

## 2019-02-08 DIAGNOSIS — Z8614 Personal history of Methicillin resistant Staphylococcus aureus infection: Secondary | ICD-10-CM

## 2019-02-08 DIAGNOSIS — Z833 Family history of diabetes mellitus: Secondary | ICD-10-CM

## 2019-02-08 DIAGNOSIS — Z8719 Personal history of other diseases of the digestive system: Secondary | ICD-10-CM

## 2019-02-08 DIAGNOSIS — E119 Type 2 diabetes mellitus without complications: Secondary | ICD-10-CM

## 2019-02-08 MED ORDER — LIDOCAINE VISCOUS HCL 2 % MT SOLN
15.0000 mL | Freq: Once | OROMUCOSAL | Status: AC
  Administered 2019-02-08: 23:00:00 15 mL via ORAL
  Filled 2019-02-08: qty 15

## 2019-02-08 MED ORDER — ALUM & MAG HYDROXIDE-SIMETH 200-200-20 MG/5ML PO SUSP
30.0000 mL | Freq: Once | ORAL | Status: AC
  Administered 2019-02-08: 30 mL via ORAL
  Filled 2019-02-08: qty 30

## 2019-02-08 NOTE — ED Triage Notes (Signed)
Patient is from home and transported via [facility]. EMS states she is complaining of chest pain all day with nausea and left arm tingling. Pain is across all of chest, described as pressure. Also, reported she has had recent changes in BP medication for uncontrolled BP. Patient took ASA prior to EMS arrival and EMS administered Zofran 4mg  IV enroute.

## 2019-02-09 ENCOUNTER — Other Ambulatory Visit: Payer: Self-pay

## 2019-02-09 ENCOUNTER — Encounter: Payer: Self-pay | Admitting: Cardiology

## 2019-02-09 ENCOUNTER — Emergency Department (HOSPITAL_COMMUNITY): Payer: Medicare Other

## 2019-02-09 ENCOUNTER — Encounter (HOSPITAL_COMMUNITY): Payer: Self-pay | Admitting: Emergency Medicine

## 2019-02-09 ENCOUNTER — Inpatient Hospital Stay (HOSPITAL_COMMUNITY): Payer: Medicare Other

## 2019-02-09 DIAGNOSIS — I10 Essential (primary) hypertension: Secondary | ICD-10-CM | POA: Diagnosis present

## 2019-02-09 DIAGNOSIS — Q211 Atrial septal defect: Secondary | ICD-10-CM | POA: Diagnosis not present

## 2019-02-09 DIAGNOSIS — Z801 Family history of malignant neoplasm of trachea, bronchus and lung: Secondary | ICD-10-CM | POA: Diagnosis not present

## 2019-02-09 DIAGNOSIS — Z87891 Personal history of nicotine dependence: Secondary | ICD-10-CM | POA: Diagnosis not present

## 2019-02-09 DIAGNOSIS — R079 Chest pain, unspecified: Secondary | ICD-10-CM

## 2019-02-09 DIAGNOSIS — R112 Nausea with vomiting, unspecified: Secondary | ICD-10-CM | POA: Diagnosis present

## 2019-02-09 DIAGNOSIS — J45909 Unspecified asthma, uncomplicated: Secondary | ICD-10-CM | POA: Diagnosis present

## 2019-02-09 DIAGNOSIS — I25110 Atherosclerotic heart disease of native coronary artery with unstable angina pectoris: Secondary | ICD-10-CM | POA: Diagnosis present

## 2019-02-09 DIAGNOSIS — Z7951 Long term (current) use of inhaled steroids: Secondary | ICD-10-CM | POA: Diagnosis not present

## 2019-02-09 DIAGNOSIS — Z20828 Contact with and (suspected) exposure to other viral communicable diseases: Secondary | ICD-10-CM | POA: Diagnosis present

## 2019-02-09 DIAGNOSIS — Z833 Family history of diabetes mellitus: Secondary | ICD-10-CM | POA: Diagnosis not present

## 2019-02-09 DIAGNOSIS — R9439 Abnormal result of other cardiovascular function study: Secondary | ICD-10-CM | POA: Diagnosis present

## 2019-02-09 DIAGNOSIS — R0789 Other chest pain: Secondary | ICD-10-CM | POA: Diagnosis not present

## 2019-02-09 DIAGNOSIS — K219 Gastro-esophageal reflux disease without esophagitis: Secondary | ICD-10-CM | POA: Diagnosis present

## 2019-02-09 DIAGNOSIS — Z6841 Body Mass Index (BMI) 40.0 and over, adult: Secondary | ICD-10-CM | POA: Diagnosis not present

## 2019-02-09 DIAGNOSIS — Z96653 Presence of artificial knee joint, bilateral: Secondary | ICD-10-CM | POA: Diagnosis present

## 2019-02-09 DIAGNOSIS — E785 Hyperlipidemia, unspecified: Secondary | ICD-10-CM | POA: Diagnosis not present

## 2019-02-09 DIAGNOSIS — F329 Major depressive disorder, single episode, unspecified: Secondary | ICD-10-CM | POA: Diagnosis present

## 2019-02-09 DIAGNOSIS — E162 Hypoglycemia, unspecified: Secondary | ICD-10-CM | POA: Diagnosis not present

## 2019-02-09 DIAGNOSIS — Z885 Allergy status to narcotic agent status: Secondary | ICD-10-CM | POA: Diagnosis not present

## 2019-02-09 DIAGNOSIS — Z79899 Other long term (current) drug therapy: Secondary | ICD-10-CM | POA: Diagnosis not present

## 2019-02-09 DIAGNOSIS — Z880 Allergy status to penicillin: Secondary | ICD-10-CM | POA: Diagnosis not present

## 2019-02-09 DIAGNOSIS — Z91040 Latex allergy status: Secondary | ICD-10-CM | POA: Diagnosis not present

## 2019-02-09 DIAGNOSIS — R55 Syncope and collapse: Secondary | ICD-10-CM

## 2019-02-09 DIAGNOSIS — Z7984 Long term (current) use of oral hypoglycemic drugs: Secondary | ICD-10-CM | POA: Diagnosis not present

## 2019-02-09 DIAGNOSIS — E119 Type 2 diabetes mellitus without complications: Secondary | ICD-10-CM | POA: Diagnosis not present

## 2019-02-09 DIAGNOSIS — R072 Precordial pain: Secondary | ICD-10-CM | POA: Diagnosis not present

## 2019-02-09 DIAGNOSIS — F419 Anxiety disorder, unspecified: Secondary | ICD-10-CM | POA: Diagnosis present

## 2019-02-09 DIAGNOSIS — E11649 Type 2 diabetes mellitus with hypoglycemia without coma: Secondary | ICD-10-CM | POA: Diagnosis present

## 2019-02-09 DIAGNOSIS — I200 Unstable angina: Secondary | ICD-10-CM | POA: Diagnosis not present

## 2019-02-09 HISTORY — DX: Chest pain, unspecified: R07.9

## 2019-02-09 HISTORY — DX: Syncope and collapse: R55

## 2019-02-09 LAB — TROPONIN I (HIGH SENSITIVITY)
Troponin I (High Sensitivity): <2 ng/L (ref <18)
Troponin I (High Sensitivity): <2 ng/L (ref <18)
Troponin I (High Sensitivity): <2 ng/L (ref <18)

## 2019-02-09 LAB — COMPREHENSIVE METABOLIC PANEL
ALT: 38 U/L (ref 0–44)
AST: 26 U/L (ref 15–41)
Albumin: 3.5 g/dL (ref 3.5–5.0)
Alkaline Phosphatase: 59 U/L (ref 38–126)
Anion gap: 8 (ref 5–15)
BUN: 11 mg/dL (ref 6–20)
CO2: 25 mmol/L (ref 22–32)
Calcium: 8 mg/dL — ABNORMAL LOW (ref 8.9–10.3)
Chloride: 108 mmol/L (ref 98–111)
Creatinine, Ser: 0.68 mg/dL (ref 0.44–1.00)
GFR calc Af Amer: >60 mL/min (ref >60)
GFR calc non Af Amer: >60 mL/min (ref >60)
Glucose, Bld: 83 mg/dL (ref 70–99)
Potassium: 3.8 mmol/L (ref 3.5–5.1)
Sodium: 141 mmol/L (ref 135–145)
Total Bilirubin: 0.5 mg/dL (ref 0.3–1.2)
Total Protein: 6.3 g/dL — ABNORMAL LOW (ref 6.5–8.1)

## 2019-02-09 LAB — CBC WITH DIFFERENTIAL/PLATELET
Abs Immature Granulocytes: 0.03 10*3/uL (ref 0.00–0.07)
Basophils Absolute: 0 10*3/uL (ref 0.0–0.1)
Basophils Relative: 1 %
Eosinophils Absolute: 0.1 10*3/uL (ref 0.0–0.5)
Eosinophils Relative: 2 %
HCT: 40.1 % (ref 36.0–46.0)
Hemoglobin: 12.4 g/dL (ref 12.0–15.0)
Immature Granulocytes: 0 %
Lymphocytes Relative: 39 %
Lymphs Abs: 2.9 10*3/uL (ref 0.7–4.0)
MCH: 30.2 pg (ref 26.0–34.0)
MCHC: 30.9 g/dL (ref 30.0–36.0)
MCV: 97.8 fL (ref 80.0–100.0)
Monocytes Absolute: 0.4 10*3/uL (ref 0.1–1.0)
Monocytes Relative: 6 %
Neutro Abs: 3.9 10*3/uL (ref 1.7–7.7)
Neutrophils Relative %: 52 %
Platelets: 381 10*3/uL (ref 150–400)
RBC: 4.1 MIL/uL (ref 3.87–5.11)
RDW: 14.6 % (ref 11.5–15.5)
WBC: 7.4 10*3/uL (ref 4.0–10.5)
nRBC: 0 % (ref 0.0–0.2)

## 2019-02-09 LAB — CBC
HCT: 38.9 % (ref 36.0–46.0)
Hemoglobin: 12.1 g/dL (ref 12.0–15.0)
MCH: 30.3 pg (ref 26.0–34.0)
MCHC: 31.1 g/dL (ref 30.0–36.0)
MCV: 97.5 fL (ref 80.0–100.0)
Platelets: 365 10*3/uL (ref 150–400)
RBC: 3.99 MIL/uL (ref 3.87–5.11)
RDW: 14.6 % (ref 11.5–15.5)
WBC: 6.9 10*3/uL (ref 4.0–10.5)
nRBC: 0.3 % — ABNORMAL HIGH (ref 0.0–0.2)

## 2019-02-09 LAB — BRAIN NATRIURETIC PEPTIDE: B Natriuretic Peptide: 18.8 pg/mL (ref 0.0–100.0)

## 2019-02-09 LAB — HIV ANTIBODY (ROUTINE TESTING W REFLEX): HIV Screen 4th Generation wRfx: NONREACTIVE

## 2019-02-09 LAB — CBG MONITORING, ED
Glucose-Capillary: 60 mg/dL — ABNORMAL LOW (ref 70–99)
Glucose-Capillary: 180 mg/dL — ABNORMAL HIGH (ref 70–99)

## 2019-02-09 LAB — GLUCOSE, CAPILLARY
Glucose-Capillary: 73 mg/dL (ref 70–99)
Glucose-Capillary: 132 mg/dL — ABNORMAL HIGH (ref 70–99)
Glucose-Capillary: 69 mg/dL — ABNORMAL LOW (ref 70–99)
Glucose-Capillary: 89 mg/dL (ref 70–99)
Glucose-Capillary: 70 mg/dL (ref 70–99)
Glucose-Capillary: 74 mg/dL (ref 70–99)
Glucose-Capillary: 77 mg/dL (ref 70–99)
Glucose-Capillary: 119 mg/dL — ABNORMAL HIGH (ref 70–99)
Glucose-Capillary: 94 mg/dL (ref 70–99)

## 2019-02-09 LAB — I-STAT CHEM 8, ED
BUN: 9 mg/dL (ref 6–20)
Calcium, Ion: 0.97 mmol/L — ABNORMAL LOW (ref 1.15–1.40)
Chloride: 109 mmol/L (ref 98–111)
Creatinine, Ser: 0.6 mg/dL (ref 0.44–1.00)
Glucose, Bld: 64 mg/dL — ABNORMAL LOW (ref 70–99)
HCT: 41 % (ref 36.0–46.0)
Hemoglobin: 13.9 g/dL (ref 12.0–15.0)
Potassium: 4 mmol/L (ref 3.5–5.1)
Sodium: 142 mmol/L (ref 135–145)
TCO2: 20 mmol/L — ABNORMAL LOW (ref 22–32)

## 2019-02-09 LAB — ECHOCARDIOGRAM COMPLETE
Height: 60 in
Weight: 3332.8 oz

## 2019-02-09 LAB — MRSA PCR SCREENING: MRSA by PCR: NEGATIVE

## 2019-02-09 LAB — SARS CORONAVIRUS 2 (TAT 6-24 HRS): SARS Coronavirus 2: NEGATIVE

## 2019-02-09 MED ORDER — QUETIAPINE FUMARATE 100 MG PO TABS
100.0000 mg | ORAL_TABLET | Freq: Every day | ORAL | Status: DC
  Administered 2019-02-09 – 2019-02-12 (×4): 100 mg via ORAL
  Filled 2019-02-09 (×4): qty 1

## 2019-02-09 MED ORDER — SODIUM CHLORIDE (PF) 0.9 % IJ SOLN
INTRAMUSCULAR | Status: AC
  Administered 2019-02-09: 03:00:00
  Filled 2019-02-09: qty 50

## 2019-02-09 MED ORDER — ALBUTEROL SULFATE HFA 108 (90 BASE) MCG/ACT IN AERS: 2.0000 | INHALATION_SPRAY | Freq: Four times a day (QID) | RESPIRATORY_TRACT | Status: DC | PRN

## 2019-02-09 MED ORDER — DEXTROSE 5 % IV SOLN: Freq: Once | INTRAVENOUS | Status: DC

## 2019-02-09 MED ORDER — ENOXAPARIN SODIUM 40 MG/0.4ML ~~LOC~~ SOLN
40.0000 mg | SUBCUTANEOUS | Status: DC
  Administered 2019-02-09 – 2019-02-12 (×4): 40 mg via SUBCUTANEOUS
  Filled 2019-02-09 (×5): qty 0.4

## 2019-02-09 MED ORDER — FLUTICASONE PROPIONATE 50 MCG/ACT NA SUSP
2.0000 | Freq: Every day | NASAL | Status: DC | PRN
  Filled 2019-02-09: qty 16

## 2019-02-09 MED ORDER — MONTELUKAST SODIUM 10 MG PO TABS
10.0000 mg | ORAL_TABLET | Freq: Every day | ORAL | Status: DC
  Administered 2019-02-09 – 2019-02-12 (×4): 10 mg via ORAL
  Filled 2019-02-09 (×4): qty 1

## 2019-02-09 MED ORDER — ASPIRIN 81 MG PO CHEW
81.0000 mg | CHEWABLE_TABLET | Freq: Every day | ORAL | Status: DC
  Administered 2019-02-09 – 2019-02-12 (×4): 81 mg via ORAL
  Filled 2019-02-09 (×6): qty 1

## 2019-02-09 MED ORDER — ATORVASTATIN CALCIUM 10 MG PO TABS
10.0000 mg | ORAL_TABLET | Freq: Every day | ORAL | Status: DC
  Administered 2019-02-09 – 2019-02-12 (×5): 10 mg via ORAL
  Filled 2019-02-09 (×5): qty 1

## 2019-02-09 MED ORDER — ONDANSETRON HCL 4 MG/2ML IJ SOLN
4.0000 mg | Freq: Four times a day (QID) | INTRAMUSCULAR | Status: DC | PRN
  Administered 2019-02-09 (×2): 4 mg via INTRAVENOUS
  Filled 2019-02-09 (×2): qty 2

## 2019-02-09 MED ORDER — CELECOXIB 200 MG PO CAPS
200.0000 mg | ORAL_CAPSULE | Freq: Once | ORAL | Status: AC
  Administered 2019-02-09: 200 mg via ORAL
  Filled 2019-02-09: qty 1

## 2019-02-09 MED ORDER — PANTOPRAZOLE SODIUM 40 MG PO TBEC
40.0000 mg | DELAYED_RELEASE_TABLET | Freq: Every day | ORAL | Status: DC
  Administered 2019-02-09 – 2019-02-12 (×4): 40 mg via ORAL
  Filled 2019-02-09 (×4): qty 1

## 2019-02-09 MED ORDER — DEXTROSE-NACL 5-0.45 % IV SOLN
INTRAVENOUS | Status: AC
  Administered 2019-02-09: 02:00:00 via INTRAVENOUS

## 2019-02-09 MED ORDER — INSULIN ASPART 100 UNIT/ML ~~LOC~~ SOLN
0.0000 [IU] | SUBCUTANEOUS | Status: DC
  Administered 2019-02-10: 2 [IU] via SUBCUTANEOUS
  Administered 2019-02-13 (×2): 1 [IU] via SUBCUTANEOUS
  Filled 2019-02-09: qty 0.09

## 2019-02-09 MED ORDER — KETOROLAC TROMETHAMINE 30 MG/ML IJ SOLN
15.0000 mg | Freq: Once | INTRAMUSCULAR | Status: AC
  Administered 2019-02-09: 01:00:00 15 mg via INTRAVENOUS
  Filled 2019-02-09: qty 1

## 2019-02-09 MED ORDER — DIAZEPAM 5 MG PO TABS
10.0000 mg | ORAL_TABLET | Freq: Four times a day (QID) | ORAL | Status: DC
  Administered 2019-02-09 – 2019-02-13 (×13): 10 mg via ORAL
  Filled 2019-02-09 (×13): qty 2

## 2019-02-09 MED ORDER — ALBUTEROL SULFATE (2.5 MG/3ML) 0.083% IN NEBU: 2.5000 mg | INHALATION_SOLUTION | Freq: Four times a day (QID) | RESPIRATORY_TRACT | Status: DC | PRN

## 2019-02-09 MED ORDER — DEXTROSE 50 % IV SOLN
25.0000 mL | Freq: Once | INTRAVENOUS | Status: AC
  Administered 2019-02-09: 25 mL via INTRAVENOUS
  Filled 2019-02-09: qty 50

## 2019-02-09 MED ORDER — DEXTROSE-NACL 5-0.45 % IV SOLN: INTRAVENOUS | Status: AC

## 2019-02-09 MED ORDER — LISINOPRIL 10 MG PO TABS
10.0000 mg | ORAL_TABLET | Freq: Every day | ORAL | Status: DC
  Administered 2019-02-09 – 2019-02-13 (×5): 10 mg via ORAL
  Filled 2019-02-09 (×5): qty 1

## 2019-02-09 MED ORDER — CARVEDILOL 3.125 MG PO TABS
3.1250 mg | ORAL_TABLET | Freq: Two times a day (BID) | ORAL | Status: DC
  Administered 2019-02-09 – 2019-02-13 (×8): 3.125 mg via ORAL
  Filled 2019-02-09 (×8): qty 1

## 2019-02-09 MED ORDER — FLUTICASONE FUROATE-VILANTEROL 200-25 MCG/INH IN AEPB
1.0000 | INHALATION_SPRAY | Freq: Every day | RESPIRATORY_TRACT | Status: DC
  Administered 2019-02-09: 1 via RESPIRATORY_TRACT
  Filled 2019-02-09: qty 28

## 2019-02-09 MED ORDER — POLYETHYLENE GLYCOL 3350 17 G PO PACK
17.0000 g | PACK | Freq: Every day | ORAL | Status: DC
  Administered 2019-02-11 – 2019-02-12 (×2): 17 g via ORAL
  Filled 2019-02-09 (×2): qty 1

## 2019-02-09 MED ORDER — BUSPIRONE HCL 5 MG PO TABS
15.0000 mg | ORAL_TABLET | Freq: Three times a day (TID) | ORAL | Status: DC
  Administered 2019-02-09 – 2019-02-13 (×10): 15 mg via ORAL
  Filled 2019-02-09 (×11): qty 3

## 2019-02-09 MED ORDER — ROPINIROLE HCL 1 MG PO TABS
1.0000 mg | ORAL_TABLET | Freq: Every day | ORAL | Status: DC
  Administered 2019-02-09 – 2019-02-12 (×4): 1 mg via ORAL
  Filled 2019-02-09 (×4): qty 1

## 2019-02-09 MED ORDER — IOHEXOL 350 MG/ML SOLN
100.0000 mL | Freq: Once | INTRAVENOUS | Status: AC | PRN
  Administered 2019-02-09: 100 mL via INTRAVENOUS

## 2019-02-09 MED ORDER — DEXTROSE 50 % IV SOLN
12.5000 g | INTRAVENOUS | Status: AC
  Administered 2019-02-09: 08:00:00 12.5 g via INTRAVENOUS
  Filled 2019-02-09: qty 50

## 2019-02-09 NOTE — Progress Notes (Signed)
Patient was admitted earlier today. Patient is a poor historian. Patient was admitted with chest pain. Chest pain seems reproducible. Cardiac enzymes have been negative. Echocardiogram is pending. Patient has history of diabetes mellitus and hypertension Likely cardiac stress test in the morning.

## 2019-02-09 NOTE — ED Provider Notes (Signed)
[facility] DEPT Provider Note   CSN: [id number] Arrival date & time: 02/08/19  2233     History   Chief Complaint Chief Complaint  Patient presents with  . Chest Pain    HPI [name] is a 53 y.o. female.     The history is provided by the patient.  Chest Pain Pain location:  Substernal area Pain quality: dull   Pain radiates to:  Does not radiate Pain severity:  Moderate Onset quality:  Gradual Duration:  18 hours Timing:  Constant Progression:  Worsening Chronicity:  New Context: at rest   Context: not breathing, not drug use, not eating, not intercourse, not lifting, not movement, not raising an arm, not stress and not trauma   Relieved by:  Nothing Worsened by:  Nothing Ineffective treatments:  None tried Associated symptoms: dizziness, nausea, syncope and vomiting   Associated symptoms: no abdominal pain, no AICD problem, no altered mental status, no anorexia, no anxiety, no claudication, no cough, no diaphoresis, no dysphagia, no fatigue, no fever, no headache, no heartburn, no lower extremity edema, no near-syncope, no numbness, no orthopnea, no palpitations, no PND and no weakness   Patient had chest pain with arm tingling for 18 hours.  Then this evening had nausea and vomiting and an episode of syncope.  No f/c/r. No exertional symptoms.    Past Medical History:  Diagnosis Date  . Anxiety   . Arthritis    osteoarthritis-knees  . Asthma   . Breast fibroadenoma, left   . Depression   . Diabetes mellitus without complication ([place])   . 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 GERD (gastroesophageal reflux disease)   . Headache(784.0)    occ. with allergies/sinus issues  . History of colon polyps 04/2016  . History of MRSA infection 2007  . Hypertension    no problems since weight loss -'09  . Multiple food allergies    Latex allergy-respiratory  . Obesity    . Tendonitis   . Vitamin D deficiency    history of    Patient Active Problem List   Diagnosis Date Noted  . Dyspnea on exertion 09/22/2018  . PFO (patent foramen ovale) 09/22/2018  . Type 2 diabetes mellitus without complication, without long-term current use of insulin ([place]) 09/22/2018  . S/P left TKA 02/15/2018  . Insomnia 02/06/2013  . Anxiety and depression 02/06/2013  . Unspecified constipation 02/06/2013  . GERD (gastroesophageal reflux disease) 02/06/2013  . Anemia, iron deficiency 02/06/2013  . Expected blood loss anemia 02/01/2013  . Obese 02/01/2013  . S/P right TKA 01/31/2013  . Headache(784.0) 11/28/2012  . Stroke-like symptoms 11/26/2012  . CHEST PAIN UNSPECIFIED 03/06/2009  . DEPRESSION 03/05/2009  . Essential hypertension 03/05/2009  . FATTY LIVER DISEASE 03/05/2009    Past Surgical History:  Procedure Laterality Date  . ABDOMINAL HYSTERECTOMY    . BOWEL RESECTION    . CESAREAN SECTION     x4  . COLONOSCOPY W/ POLYPECTOMY  04/2016  . KNEE SURGERY     Right knee scope 8'12  . TOTAL KNEE ARTHROPLASTY Right 01/31/2013   Procedure: RIGHT TOTAL KNEE ARTHROPLASTY;  Surgeon: [name], MD;  Location: [facility] ORS;  Service: Orthopedics;  Laterality: Right;  . TOTAL KNEE ARTHROPLASTY Left 02/15/2018   Procedure: LEFT TOTAL KNEE ARTHROPLASTY;  Surgeon: Paralee Cancel, MD;  Location: [facility];  Service: Orthopedics;  Laterality: Left;  68  OB History   No obstetric history on file.      Home Medications    Prior to Admission medications   Medication Sig Start Date End Date Taking? Authorizing Provider  albuterol (PROVENTIL HFA;VENTOLIN HFA) 108 (90 BASE) MCG/ACT inhaler Inhale 2 puffs into the lungs every 6 (six) hours as needed for wheezing or shortness of breath.   Yes [provider]  aspirin 81 MG chewable tablet Chew 81 mg by mouth daily.   Yes [provider]  atorvastatin (LIPITOR) 10 MG tablet Take 10 mg by mouth at bedtime.   Yes  [provider]  BREO ELLIPTA 200-25 MCG/INH AEPB Inhale 1 puff into the lungs daily. 12/28/17  Yes [provider]  busPIRone (BUSPAR) 15 MG tablet Take 15 mg by mouth 3 (three) times daily.   Yes [provider]  carvedilol (COREG) 3.125 MG tablet Take 3.125 mg by mouth 2 (two) times daily with a meal.   Yes [provider]  diazepam (VALIUM) 10 MG tablet Take 10 mg by mouth 4 (four) times daily.   Yes [provider]  EPINEPHrine (EPI-PEN) 0.3 mg/0.3 mL DEVI Inject 0.3 mg into the muscle once.  06/26/11  Yes Carlisle Cater, PA-C  fluticasone (FLONASE) 50 MCG/ACT nasal spray Place 2 sprays into both nostrils daily as needed for allergies.    Yes [provider]  glipiZIDE (GLUCOTROL XL) 10 MG 24 hr tablet Take 1 tablet by mouth daily. 10/17/18  Yes [provider]  lisinopril (PRINIVIL,ZESTRIL) 10 MG tablet Take 10 mg by mouth daily.   Yes [provider]  montelukast (SINGULAIR) 10 MG tablet Take 10 mg by mouth daily. 10/06/17  Yes [provider]  omeprazole (PRILOSEC) 20 MG capsule Take 20 mg by mouth daily.   Yes [provider]  OZEMPIC, 0.25 OR 0.5 MG/DOSE, 2 MG/1.5ML SOPN 1 mg by Subconjunctival route See admin instructions. On Tuesdays 10/31/18  Yes [provider]  polyethylene glycol (MIRALAX / GLYCOLAX) 17 g packet Take 17 g by mouth daily.   Yes [provider]  QUEtiapine (SEROQUEL) 100 MG tablet Take 100 mg by mouth at bedtime. 10/27/17  Yes [provider]  rOPINIRole (REQUIP) 1 MG tablet Take 1 mg by mouth at bedtime.   Yes [provider]  docusate sodium (COLACE) 100 MG capsule Take 1 capsule (100 mg total) by mouth 2 (two) times daily. Patient not taking: Reported on 02/08/2019 02/15/18   [name], PA-C  methocarbamol (ROBAXIN) 500 MG tablet Take 1 tablet (500 mg total) by mouth every 6 (six) hours as needed for muscle spasms. Patient not taking: Reported  on 02/08/2019 02/15/18   [name], PA-C    Family History Family History  Problem Relation Age of Onset  . Lung cancer Mother   . Diabetes Father   . Colon cancer Neg Hx     Social History Social History   Tobacco Use  . Smoking status: Former Smoker    Quit date: 01/25/2005    Years since quitting: 14.0  . Smokeless tobacco: Never Used  Substance Use Topics  . Alcohol use: No  . Drug use: No     Allergies   Banana, Latex, Onion, Penicillins, Tylenol [acetaminophen], Codeine, Hydrocodone, and Insect wax   Review of Systems Review of Systems  Constitutional: Negative for diaphoresis, fatigue and fever.  HENT: Negative for trouble swallowing.   Eyes: Negative for visual disturbance.  Respiratory: Negative for cough.   Cardiovascular: Positive  for chest pain and syncope. Negative for palpitations, orthopnea, claudication, PND and near-syncope.  Gastrointestinal: Positive for nausea and vomiting. Negative for abdominal pain, anorexia and heartburn.  Genitourinary: Negative for difficulty urinating.  Musculoskeletal: Positive for arthralgias. Negative for neck pain.  Skin: Negative for rash.  Neurological: Positive for dizziness. Negative for facial asymmetry, speech difficulty, weakness, numbness and headaches.  Psychiatric/Behavioral: Negative for agitation.  All other systems reviewed and are negative.    Physical Exam Updated Vital Signs BP (!) 133/94 (BP Location: Left Arm)   Pulse 90   Temp 97.6 F (36.4 C) (Oral)   Resp (!) 22   SpO2 98%   Physical Exam Vitals signs and nursing note reviewed.  Constitutional:      Appearance: Normal appearance. She is not diaphoretic.  HENT:     Head: Normocephalic and atraumatic.     Nose: Nose normal.  Eyes:     Conjunctiva/sclera: Conjunctivae normal.     Pupils: Pupils are equal, round, and reactive to light.  Neck:     Musculoskeletal: Normal range of motion and neck supple.  Cardiovascular:     Rate  and Rhythm: Normal rate and regular rhythm.     Pulses: Normal pulses.     Heart sounds: Normal heart sounds.  Pulmonary:     Effort: Pulmonary effort is normal.     Breath sounds: Normal breath sounds.  Abdominal:     General: Abdomen is flat. Bowel sounds are normal.     Tenderness: There is no abdominal tenderness. There is no guarding or rebound.  Musculoskeletal: Normal range of motion.     Right lower leg: No edema.     Left lower leg: No edema.  Skin:    General: Skin is warm and dry.     Capillary Refill: Capillary refill takes less than 2 seconds.  Neurological:     General: No focal deficit present.     Mental Status: She is alert and oriented to person, place, and time.     Deep Tendon Reflexes: Reflexes normal.  Psychiatric:        Mood and Affect: Mood normal.        Behavior: Behavior normal.      ED Treatments / Results  Labs (all labs ordered are listed, but only abnormal results are displayed) Results for orders placed or performed during the hospital encounter of 02/08/19  CBC with Differential  Result Value Ref Range   WBC 7.4 4.0 - 10.5 K/uL   RBC 4.10 3.87 - 5.11 MIL/uL   Hemoglobin 12.4 12.0 - 15.0 g/dL   HCT 40.1 36.0 - 46.0 %   MCV 97.8 80.0 - 100.0 fL   MCH 30.2 26.0 - 34.0 pg   MCHC 30.9 30.0 - 36.0 g/dL   RDW 14.6 11.5 - 15.5 %   Platelets 381 150 - 400 K/uL   nRBC 0.0 0.0 - 0.2 %   Neutrophils Relative % 52 %   Neutro Abs 3.9 1.7 - 7.7 K/uL   Lymphocytes Relative 39 %   Lymphs Abs 2.9 0.7 - 4.0 K/uL   Monocytes Relative 6 %   Monocytes Absolute 0.4 0.1 - 1.0 K/uL   Eosinophils Relative 2 %   Eosinophils Absolute 0.1 0.0 - 0.5 K/uL   Basophils Relative 1 %   Basophils Absolute 0.0 0.0 - 0.1 K/uL   Immature Granulocytes 0 %   Abs Immature Granulocytes 0.03 0.00 - 0.07 K/uL  Brain natriuretic peptide  Result Value Ref  Range   B Natriuretic Peptide 18.8 0.0 - 100.0 pg/mL  I-stat chem 8, ED (not at [facility] or [facility])  Result Value Ref Range    Sodium 142 135 - 145 mmol/L   Potassium 4.0 3.5 - 5.1 mmol/L   Chloride 109 98 - 111 mmol/L   BUN 9 6 - 20 mg/dL   Creatinine, Ser 0.60 0.44 - 1.00 mg/dL   Glucose, Bld 64 (L) 70 - 99 mg/dL   Calcium, Ion 0.97 (L) 1.15 - 1.40 mmol/L   TCO2 20 (L) 22 - 32 mmol/L   Hemoglobin 13.9 12.0 - 15.0 g/dL   HCT 41.0 36.0 - 46.0 %  POC CBG, ED  Result Value Ref Range   Glucose-Capillary 60 (L) 70 - 99 mg/dL  Troponin I (High Sensitivity)  Result Value Ref Range   Troponin I (High Sensitivity) <2.00 <18 ng/L   Ct Angio Chest Pe W And/or Wo Contrast  Result Date: 02/09/2019 CLINICAL DATA:  Chest pain EXAM: CT ANGIOGRAPHY CHEST WITH CONTRAST TECHNIQUE: Multidetector CT imaging of the chest was performed using the standard protocol during bolus administration of intravenous contrast. Multiplanar CT image reconstructions and MIPs were obtained to evaluate the vascular anatomy. CONTRAST:  152mL OMNIPAQUE IOHEXOL 350 MG/ML SOLN COMPARISON:  02/23/2009 FINDINGS: Cardiovascular: No filling defects in the pulmonary arteries to suggest pulmonary emboli. Heart is mildly enlarged. Aorta is normal caliber. No dissection. Mediastinum/Nodes: No mediastinal, hilar, or axillary adenopathy. Trachea and esophagus are unremarkable. Thyroid unremarkable. Lungs/Pleura: Bibasilar atelectasis.  No effusions. Upper Abdomen: Imaging into the upper abdomen shows no acute findings. Musculoskeletal: Chest wall soft tissues are unremarkable. No acute bony abnormality. Review of the MIP images confirms the above findings. IMPRESSION: No evidence of pulmonary embolus. Mild cardiomegaly. Bibasilar atelectasis. Electronically Signed   By: Rolm Baptise M.D.   On: 02/09/2019 01:17   Dg Chest Portable 1 View  Result Date: 02/08/2019 CLINICAL DATA:  Chest pain, shortness of breath EXAM: PORTABLE CHEST 1 VIEW COMPARISON:  02/20/2018 FINDINGS: Heart is borderline enlarged. Mild vascular congestion. Interstitial prominence could reflect early  interstitial edema. Bibasilar atelectasis. No effusions or acute bony abnormality. IMPRESSION: Borderline cardiomegaly with vascular congestion and interstitial prominence, question early interstitial edema. Bibasilar atelectasis. Electronically Signed   By: Rolm Baptise M.D.   On: 02/08/2019 23:19    EKG EKG Interpretation  Date/Time:  Wednesday February 08 2019 23:03:30 EST Ventricular Rate:  88 PR Interval:    QRS Duration: 113 QT Interval:  403 QTC Calculation: 488 R Axis:   -42 Text Interpretation: Sinus rhythm Borderline IVCD with LAD Confirmed by Randal Buba, Doreene Forrey ([id number]) on 02/08/2019 11:08:21 PM   Radiology Dg Chest Portable 1 View  Result Date: 02/08/2019 CLINICAL DATA:  Chest pain, shortness of breath EXAM: PORTABLE CHEST 1 VIEW COMPARISON:  02/20/2018 FINDINGS: Heart is borderline enlarged. Mild vascular congestion. Interstitial prominence could reflect early interstitial edema. Bibasilar atelectasis. No effusions or acute bony abnormality. IMPRESSION: Borderline cardiomegaly with vascular congestion and interstitial prominence, question early interstitial edema. Bibasilar atelectasis. Electronically Signed   By: Rolm Baptise M.D.   On: 02/08/2019 23:19    Procedures Procedures (including critical care time)  Medications Ordered in ED Medications  sodium chloride (PF) 0.9 % injection (has no administration in time range)  dextrose 5 % solution (has no administration in time range)  alum & mag hydroxide-simeth (MAALOX/MYLANTA) 200-200-20 MG/5ML suspension 30 mL (30 mLs Oral Given 02/08/19 2325)    And  lidocaine (XYLOCAINE) 2 % viscous mouth solution  15 mL (15 mLs Oral Given 02/08/19 2325)  ketorolac (TORADOL) 30 MG/ML injection 15 mg (15 mg Intravenous Given 02/09/19 0118)  dextrose 50 % solution 25 mL (25 mLs Intravenous Given 02/09/19 0121)  iohexol (OMNIPAQUE) 350 MG/ML injection 100 mL (100 mLs Intravenous Contrast Given 02/09/19 0054)      Final Clinical Impressions(s)  / ED Diagnoses   Hypoglycemia on sulfonurea.  I suspect this is why the patient passed out.  Will admit for persistent hypoglycemia    Shoua Ulloa, MD 02/09/19 0140

## 2019-02-09 NOTE — Progress Notes (Signed)
Hypoglycemic Event  CBG: 69  Treatment: 12.5g ot dextrose IV   Symptoms:headache   Follow-up CBG: Time:0801 CBG Result:132  Possible Reasons for Event: Pt NPO   Comments/MD notified:Ogbata notified via page     Con Memos

## 2019-02-09 NOTE — ED Notes (Signed)
Dr. Maudie Mercury, hospitalist at bedside.

## 2019-02-09 NOTE — ED Notes (Signed)
Patient transported to CT 

## 2019-02-09 NOTE — Progress Notes (Signed)
Echocardiogram 2D Echocardiogram has been performed.  Oneal Deputy Scarlette Hogston 02/09/2019, 10:22 AM

## 2019-02-09 NOTE — Progress Notes (Signed)
MD Ogbata notified of patients headache, nausea, abdominal tenderness, and right chest pressure radiating to right shoulder. MD was also made aware of patients CBGs remaining in the 70s, verbal orders given for carb mod/ heart healthy diet. Will continue to monitor closely.

## 2019-02-09 NOTE — Plan of Care (Signed)
  Problem: Activity: Goal: Risk for activity intolerance will decrease Outcome: Progressing   Problem: Pain Managment: Goal: General experience of comfort will improve Outcome: Progressing   Problem: Safety: Goal: Ability to remain free from injury will improve Outcome: Progressing   

## 2019-02-09 NOTE — H&P (Addendum)
TRH H&P    Patient Demographics:    Jewelia Moros, is a 53 y.o. female  MRN: [id number]  DOB - 13-Feb-1966  Admit Date - 02/08/2019  Referring MD/NP/PA: Randal Buba  Outpatient Primary MD for the patient is Bernerd Limbo, MD  Patient coming from:  home  Chief complaint- chest pain   HPI:    Merlene Kelsh  is a 53 y.o. female,w hypertension, Dm2, asthma, anxiety, gerd, c/o chest pain pressure starting at  9:30 pm,  Substernal w radiation to the right shoulder.  Was going to the bedroom when it started, made it to the kitchen and then had n/v. x1 and came to ED.  Passed out prior to n/v.  About 5 sec.  Was sitting in chair when she passed out. Pt denies fever, chills, cough, palp, abd pain, diarrhea, brbpr.   In ED  T 97.6, P 90 R 22, Bp 133/94  Pox 98% on RA  CTA chest IMPRESSION: No evidence of pulmonary embolus. Mild cardiomegaly. Bibasilar atelectasis.  Trop <2  Wbc 11.9, Hgb 10.2, Plt 422 Na 142, K 4.0, Bun 9, Creatinine 0.6  Ekg nsr at 85, borderline LAD, no st-t changes c/w ischemia  Pt will be admitted for w/up of chest pain    Review of systems:    In addition to the HPI above,  No Fever-chills, No Headache, No changes with Vision or hearing, No problems swallowing food or Liquids, No  Cough or Shortness of Breath, No Abdominal pain, No Nausea or Vomiting, bowel movements are regular, No Blood in stool or Urine, No dysuria, No new skin rashes or bruises, No new joints pains-aches,  No new weakness, tingling, numbness in any extremity, No recent weight gain or loss, No polyuria, polydypsia or polyphagia, No significant Mental Stressors.  All other systems reviewed and are negative.    Past History of the following :    Past Medical History:  Diagnosis Date  . Anxiety   . Arthritis    osteoarthritis-knees  . Asthma   . Breast fibroadenoma, left   . Depression   . Diabetes  mellitus without complication ([place])   . 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 GERD (gastroesophageal reflux disease)   . Headache(784.0)    occ. with allergies/sinus issues  . History of colon polyps 04/2016  . History of MRSA infection 2007  . Hypertension    no problems since weight loss -'09  . Multiple food allergies    Latex allergy-respiratory  . Obesity   . Tendonitis   . Vitamin D deficiency    history of      Past Surgical History:  Procedure Laterality Date  . ABDOMINAL HYSTERECTOMY    . BOWEL RESECTION    . CESAREAN SECTION     x4  . COLONOSCOPY W/ POLYPECTOMY  04/2016  . KNEE SURGERY     Right knee scope 8'12  . TOTAL KNEE ARTHROPLASTY Right 01/31/2013   Procedure: RIGHT TOTAL KNEE ARTHROPLASTY;  Surgeon: [name], MD;  Location: [facility];  Service: Orthopedics;  Laterality: Right;  . TOTAL KNEE ARTHROPLASTY Left 02/15/2018   Procedure: LEFT TOTAL KNEE ARTHROPLASTY;  Surgeon: Paralee Cancel, MD;  Location: [facility];  Service: Orthopedics;  Laterality: Left;  59      Social History:      Social History   Tobacco Use  . Smoking status: Former Smoker    Quit date: 01/25/2005    Years since quitting: 14.0  . Smokeless tobacco: Never Used  Substance Use Topics  . Alcohol use: No       Family History :     Family History  Problem Relation Age of Onset  . Lung cancer Mother   . Diabetes Father   . Colon cancer Neg Hx        Home Medications:   Prior to Admission medications   Medication Sig Start Date End Date Taking? Authorizing Provider  albuterol (PROVENTIL HFA;VENTOLIN HFA) 108 (90 BASE) MCG/ACT inhaler Inhale 2 puffs into the lungs every 6 (six) hours as needed for wheezing or shortness of breath.   Yes [provider]  aspirin 81 MG chewable tablet Chew 81 mg by mouth daily.   Yes [provider]  atorvastatin (LIPITOR) 10 MG tablet Take 10 mg by mouth at  bedtime.   Yes [provider]  BREO ELLIPTA 200-25 MCG/INH AEPB Inhale 1 puff into the lungs daily. 12/28/17  Yes [provider]  busPIRone (BUSPAR) 15 MG tablet Take 15 mg by mouth 3 (three) times daily.   Yes [provider]  carvedilol (COREG) 3.125 MG tablet Take 3.125 mg by mouth 2 (two) times daily with a meal.   Yes [provider]  diazepam (VALIUM) 10 MG tablet Take 10 mg by mouth 4 (four) times daily.   Yes [provider]  EPINEPHrine (EPI-PEN) 0.3 mg/0.3 mL DEVI Inject 0.3 mg into the muscle once.  06/26/11  Yes Carlisle Cater, PA-C  fluticasone (FLONASE) 50 MCG/ACT nasal spray Place 2 sprays into both nostrils daily as needed for allergies.    Yes [provider]  glipiZIDE (GLUCOTROL XL) 10 MG 24 hr tablet Take 1 tablet by mouth daily. 10/17/18  Yes [provider]  lisinopril (PRINIVIL,ZESTRIL) 10 MG tablet Take 10 mg by mouth daily.   Yes [provider]  montelukast (SINGULAIR) 10 MG tablet Take 10 mg by mouth daily. 10/06/17  Yes [provider]  omeprazole (PRILOSEC) 20 MG capsule Take 20 mg by mouth daily.   Yes [provider]  OZEMPIC, 0.25 OR 0.5 MG/DOSE, 2 MG/1.5ML SOPN 1 mg by Subconjunctival route See admin instructions. On Tuesdays 10/31/18  Yes [provider]  polyethylene glycol (MIRALAX / GLYCOLAX) 17 g packet Take 17 g by mouth daily.   Yes [provider]  QUEtiapine (SEROQUEL) 100 MG tablet Take 100 mg by mouth at bedtime. 10/27/17  Yes [provider]  rOPINIRole (REQUIP) 1 MG tablet Take 1 mg by mouth at bedtime.   Yes [provider]  docusate sodium (COLACE) 100 MG capsule Take 1 capsule (100 mg total) by mouth 2 (two) times daily. Patient not taking: Reported on 02/08/2019 02/15/18   [name], PA-C  methocarbamol (ROBAXIN) 500 MG tablet Take 1 tablet (500 mg total) by mouth every 6 (six) hours as needed for muscle spasms. Patient not  taking: Reported on 02/08/2019 02/15/18   [name], PA-C     Allergies:  Allergies  Allergen Reactions  . Banana Shortness Of Breath and Swelling  . Latex Shortness Of Breath  . Onion Shortness Of Breath and Swelling  . Penicillins Anaphylaxis, Swelling and Other (See Comments)    Has patient had a PCN reaction causing immediate rash, facial/tongue/throat swelling, SOB or lightheadedness with hypotension: Yes Has patient had a PCN reaction causing severe rash involving mucus membranes or skin necrosis: No Has patient had a PCN reaction that required hospitalization: Yes - inpatient Has patient had a PCN reaction occurring within the last 10 years: No If all of the above answers are "NO", then may proceed with Cephalosporin use.   . Tylenol [Acetaminophen] Other (See Comments)    Throat swelling   . Codeine Swelling  . Hydrocodone Hives and Swelling  . Insect Wax     Any Insect that bites     Physical Exam:   Vitals  Blood pressure (!) 136/99, pulse 96, temperature 97.6 F (36.4 C), temperature source Oral, resp. rate 20, height 5' (1.524 m), weight 94.5 kg, SpO2 99 %.  1.  General: axoxo3  2. Psychiatric: euthymic  3. Neurologic: nonfocal  4. HEENMT:  Anicteric, pupils 1.66mm symmetric, direct, consensual, near intact Neck: no jvd  5. Respiratory : CTAB  6. Cardiovascular : rrr s1, s2, no m/g/r,   7. Gastrointestinal:  Abd: soft, nt, nd, +bs  8. Skin:  Ext: no c/c/e,  No rash  Reproducible chest pain with palpation of chest wall  9.Musculoskeletal:  Good ROM    Data Review:    CBC Recent Labs  Lab 02/08/19 2347 02/08/19 2357  WBC 7.4  --   HGB 12.4 13.9  HCT 40.1 41.0  PLT 381  --   MCV 97.8  --   MCH 30.2  --   MCHC 30.9  --   RDW 14.6  --   LYMPHSABS 2.9  --   MONOABS 0.4  --   EOSABS 0.1  --   BASOSABS 0.0  --     ------------------------------------------------------------------------------------------------------------------  Results for orders placed or performed during the hospital encounter of 02/08/19 (from the past 48 hour(s))  Brain natriuretic peptide     Status: None   Collection Time: 02/08/19 11:45 PM  Result Value Ref Range   B Natriuretic Peptide 18.8 0.0 - 100.0 pg/mL    Comment: Performed at [facility], [place] [address] [place], [place] [address]  CBC with Differential     Status: None   Collection Time: 02/08/19 11:47 PM  Result Value Ref Range   WBC 7.4 4.0 - 10.5 K/uL   RBC 4.10 3.87 - 5.11 MIL/uL   Hemoglobin 12.4 12.0 - 15.0 g/dL   HCT 40.1 36.0 - 46.0 %   MCV 97.8 80.0 - 100.0 fL   MCH 30.2 26.0 - 34.0 pg   MCHC 30.9 30.0 - 36.0 g/dL   RDW 14.6 11.5 - 15.5 %   Platelets 381 150 - 400 K/uL   nRBC 0.0 0.0 - 0.2 %   Neutrophils Relative % 52 %   Neutro Abs 3.9 1.7 - 7.7 K/uL   Lymphocytes Relative 39 %   Lymphs Abs 2.9 0.7 - 4.0 K/uL   Monocytes Relative 6 %   Monocytes Absolute 0.4 0.1 - 1.0 K/uL   Eosinophils Relative 2 %   Eosinophils Absolute 0.1 0.0 - 0.5 K/uL   Basophils Relative 1 %   Basophils Absolute 0.0 0.0 - 0.1 K/uL   Immature Granulocytes 0 %  Abs Immature Granulocytes 0.03 0.00 - 0.07 K/uL    Comment: Performed at [facility], [place] [address] [place], [place] [address]  Troponin I (High Sensitivity)     Status: None   Collection Time: 02/08/19 11:47 PM  Result Value Ref Range   Troponin I (High Sensitivity) <2.00 <18 ng/L    Comment: (NOTE) Elevated high sensitivity troponin I (hsTnI) values and significant  changes across serial measurements may suggest ACS but many other  chronic and acute conditions are known to elevate hsTnI results.  Refer to the Links section for chest pain algorithms and additional  guidance. Performed at [facility], [place] [address], [place], [place] [address]    I-stat chem 8, ED (not at [facility] or [facility])     Status: Abnormal   Collection Time: 02/08/19 11:57 PM  Result Value Ref Range   Sodium 142 135 - 145 mmol/L   Potassium 4.0 3.5 - 5.1 mmol/L   Chloride 109 98 - 111 mmol/L   BUN 9 6 - 20 mg/dL   Creatinine, Ser 0.60 0.44 - 1.00 mg/dL   Glucose, Bld 64 (L) 70 - 99 mg/dL   Calcium, Ion 0.97 (L) 1.15 - 1.40 mmol/L   TCO2 20 (L) 22 - 32 mmol/L   Hemoglobin 13.9 12.0 - 15.0 g/dL   HCT 41.0 36.0 - 46.0 %  Troponin I (High Sensitivity)     Status: None   Collection Time: 02/09/19  1:07 AM  Result Value Ref Range   Troponin I (High Sensitivity) <2.00 <18 ng/L    Comment: Performed at [facility], [place] [address] [place],  [address]  POC CBG, ED     Status: Abnormal   Collection Time: 02/09/19  1:17 AM  Result Value Ref Range   Glucose-Capillary 60 (L) 70 - 99 mg/dL  CBG monitoring, ED     Status: Abnormal   Collection Time: 02/09/19  1:43 AM  Result Value Ref Range   Glucose-Capillary 180 (H) 70 - 99 mg/dL    Chemistries  Recent Labs  Lab 02/08/19 2357  NA 142  K 4.0  CL 109  GLUCOSE 64*  BUN 9  CREATININE 0.60   ------------------------------------------------------------------------------------------------------------------  ------------------------------------------------------------------------------------------------------------------ GFR: Estimated Creatinine Clearance: 81.7 mL/min (by C-G formula based on SCr of 0.6 mg/dL). Liver Function Tests: No results for input(s): AST, ALT, ALKPHOS, BILITOT, PROT, ALBUMIN in the last 168 hours. No results for input(s): LIPASE, AMYLASE in the last 168 hours. No results for input(s): AMMONIA in the last 168 hours. Coagulation Profile: No results for input(s): INR, PROTIME in the last 168 hours. Cardiac Enzymes: No results for input(s): CKTOTAL, CKMB, CKMBINDEX, TROPONINI in the last 168 hours. BNP (last 3 results) No results for input(s): PROBNP in the last  8760 hours. HbA1C: No results for input(s): HGBA1C in the last 72 hours. CBG: Recent Labs  Lab 02/09/19 0117 02/09/19 0143  GLUCAP 60* 180*   Lipid Profile: No results for input(s): CHOL, HDL, LDLCALC, TRIG, CHOLHDL, LDLDIRECT in the last 72 hours. Thyroid Function Tests: No results for input(s): TSH, T4TOTAL, FREET4, T3FREE, THYROIDAB in the last 72 hours. Anemia Panel: No results for input(s): VITAMINB12, FOLATE, FERRITIN, TIBC, IRON, RETICCTPCT in the last 72 hours.  --------------------------------------------------------------------------------------------------------------- Urine analysis:    Component Value Date/Time   COLORURINE YELLOW 01/25/2013 0815   APPEARANCEUR CLEAR 01/25/2013 0815   LABSPEC 1.029 01/25/2013 0815   PHURINE 5.5 01/25/2013 0815   GLUCOSEU NEGATIVE 01/25/2013 0815  HGBUR NEGATIVE 01/25/2013 0815   BILIRUBINUR NEGATIVE 01/25/2013 0815   KETONESUR NEGATIVE 01/25/2013 0815   PROTEINUR NEGATIVE 01/25/2013 0815   UROBILINOGEN 0.2 01/25/2013 0815   NITRITE NEGATIVE 01/25/2013 0815   LEUKOCYTESUR NEGATIVE 01/25/2013 0815      Imaging Results:    Ct Angio Chest Pe W And/or Wo Contrast  Result Date: 02/09/2019 CLINICAL DATA:  Chest pain EXAM: CT ANGIOGRAPHY CHEST WITH CONTRAST TECHNIQUE: Multidetector CT imaging of the chest was performed using the standard protocol during bolus administration of intravenous contrast. Multiplanar CT image reconstructions and MIPs were obtained to evaluate the vascular anatomy. CONTRAST:  135mL OMNIPAQUE IOHEXOL 350 MG/ML SOLN COMPARISON:  02/23/2009 FINDINGS: Cardiovascular: No filling defects in the pulmonary arteries to suggest pulmonary emboli. Heart is mildly enlarged. Aorta is normal caliber. No dissection. Mediastinum/Nodes: No mediastinal, hilar, or axillary adenopathy. Trachea and esophagus are unremarkable. Thyroid unremarkable. Lungs/Pleura: Bibasilar atelectasis.  No effusions. Upper Abdomen: Imaging into the  upper abdomen shows no acute findings. Musculoskeletal: Chest wall soft tissues are unremarkable. No acute bony abnormality. Review of the MIP images confirms the above findings. IMPRESSION: No evidence of pulmonary embolus. Mild cardiomegaly. Bibasilar atelectasis. Electronically Signed   By: Rolm Baptise M.D.   On: 02/09/2019 01:17   Dg Chest Portable 1 View  Result Date: 02/08/2019 CLINICAL DATA:  Chest pain, shortness of breath EXAM: PORTABLE CHEST 1 VIEW COMPARISON:  02/20/2018 FINDINGS: Heart is borderline enlarged. Mild vascular congestion. Interstitial prominence could reflect early interstitial edema. Bibasilar atelectasis. No effusions or acute bony abnormality. IMPRESSION: Borderline cardiomegaly with vascular congestion and interstitial prominence, question early interstitial edema. Bibasilar atelectasis. Electronically Signed   By: Rolm Baptise M.D.   On: 02/08/2019 23:19       Assessment & Plan:    Active Problems:   Essential hypertension   Type 2 diabetes mellitus without complication, without long-term current use of insulin (HCC)   Chest pain   Hypoglycemia   Syncope  Chest pain Atypical, reproducible with palpation ? costochrondritis Tele Trop  I  Check cardiac echo UDS Cont aspirin Cont Carvedilol 3.125mg  po bid Cont Lisinopril 10mg  po qday Cont Lipitor 10mg  po qhs If not reproducible still in Am consider cardiology consultation   Hypoglycemia, Syncope d5 1/2 ns at 45mL per hour x 5 hours  Dm2 STOP Glpizide, Ozempic fsbs ac and qhs, ISS  Gerd Cont PPI  Anxiety Cont Buspar 15mg  po tid Cont Valium 10mg  po qid prn   Asthma Cont Singulair 10mg  po qday Cont Breo 1puff qday Cont Albuterol HFA 2puff q6h prn   DVT Prophylaxis-   Lovenox - SCD  AM Labs Ordered, also please review Full Orders  Family Communication: Admission, patients condition and plan of care including tests being ordered have been discussed with the patient who indicate understanding  and agree with the plan and Code Status.  Code Status:  FULL CODE per patient, husband is with patient in ED  Admission status: Observation: Based on patients clinical presentation and evaluation of above clinical data, I have made determination that patient meets observation criteria at this time.    Time spent in minutes : 70    Jani Gravel M.D on 02/09/2019 at 4:51 AM

## 2019-02-10 ENCOUNTER — Ambulatory Visit (HOSPITAL_COMMUNITY)
Admit: 2019-02-10 | Discharge: 2019-02-10 | Disposition: A | Discharge status: Home / Self Care | Payer: Medicare Other | Source: Ambulatory Visit | Attending: Internal Medicine | Admitting: Internal Medicine

## 2019-02-10 DIAGNOSIS — I119 Hypertensive heart disease without heart failure: Secondary | ICD-10-CM | POA: Insufficient documentation

## 2019-02-10 DIAGNOSIS — I10 Essential (primary) hypertension: Secondary | ICD-10-CM

## 2019-02-10 DIAGNOSIS — R0789 Other chest pain: Secondary | ICD-10-CM | POA: Insufficient documentation

## 2019-02-10 DIAGNOSIS — E119 Type 2 diabetes mellitus without complications: Secondary | ICD-10-CM

## 2019-02-10 DIAGNOSIS — E162 Hypoglycemia, unspecified: Secondary | ICD-10-CM

## 2019-02-10 LAB — GLUCOSE, CAPILLARY
Glucose-Capillary: 102 mg/dL — ABNORMAL HIGH (ref 70–99)
Glucose-Capillary: 128 mg/dL — ABNORMAL HIGH (ref 70–99)
Glucose-Capillary: 151 mg/dL — ABNORMAL HIGH (ref 70–99)
Glucose-Capillary: 79 mg/dL (ref 70–99)
Glucose-Capillary: 92 mg/dL (ref 70–99)
Glucose-Capillary: 94 mg/dL (ref 70–99)

## 2019-02-10 LAB — NM MYOCAR MULTI W/SPECT W/WALL MOTION / EF
Estimated workload: 1 METS
Exercise duration (min): 8 min
Exercise duration (sec): 51 s
Peak HR: 115 {beats}/min
Rest HR: 77 {beats}/min

## 2019-02-10 MED ORDER — TECHNETIUM TC 99M TETROFOSMIN IV KIT
10.0000 | PACK | Freq: Once | INTRAVENOUS | Status: AC | PRN
  Administered 2019-02-10: 10 via INTRAVENOUS

## 2019-02-10 MED ORDER — REGADENOSON 0.4 MG/5ML IV SOLN
INTRAVENOUS | Status: AC
  Filled 2019-02-10: qty 5

## 2019-02-10 MED ORDER — REGADENOSON 0.4 MG/5ML IV SOLN
0.4000 mg | Freq: Once | INTRAVENOUS | Status: AC
  Administered 2019-02-10: 0.4 mg via INTRAVENOUS

## 2019-02-10 MED ORDER — CELECOXIB 200 MG PO CAPS
200.0000 mg | ORAL_CAPSULE | Freq: Once | ORAL | Status: AC
  Administered 2019-02-10: 200 mg via ORAL
  Filled 2019-02-10: qty 1

## 2019-02-10 MED ORDER — NITROGLYCERIN 0.4 MG SL SUBL: 0.8000 mg | SUBLINGUAL_TABLET | Freq: Once | SUBLINGUAL | Status: DC

## 2019-02-10 MED ORDER — TECHNETIUM TC 99M TETROFOSMIN IV KIT
30.0000 | PACK | Freq: Once | INTRAVENOUS | Status: AC | PRN
  Administered 2019-02-10: 30 via INTRAVENOUS

## 2019-02-10 MED ORDER — NITROGLYCERIN 0.4 MG SL SUBL
SUBLINGUAL_TABLET | SUBLINGUAL | Status: AC
  Administered 2019-02-10: 0.4 mg
  Filled 2019-02-10: qty 1

## 2019-02-10 NOTE — Progress Notes (Signed)
[name] presented for a nuclear stress test today.    Stress imaging is pending at this time.   Preliminary EKG findings may be listed in the chart, but the stress test result will not be finalized until perfusion imaging is complete.  After 4 minutes into stress test, patient had substernal chest tightness radiating to right shoulder and left arm numbness. Symptoms resolved with nitro x1. No EKG changes on monitor. She has been having similar symptoms at home leading to this admission.   [place], [place]  02/10/2019 11:21 AM

## 2019-02-10 NOTE — Progress Notes (Signed)
Pt report chest tightness. Level 5/10. Pt states that it radiates to her right shoulder. Will give nitrogylcerin sl per PA

## 2019-02-10 NOTE — Progress Notes (Signed)
Stress test high risk. Could be due to low EF. Echo showed LVEF of 45-50% this admission (minimally reduced from 55-60% on 11/03/2018).   Formal consult by cardiology tomorrow. Updated Dr. Marthenia Rolling.

## 2019-02-10 NOTE — Progress Notes (Signed)
PROGRESS NOTE    [name]  [id number] DOB: 1965-12-19 DOA: 02/08/2019 PCP: Bernerd Limbo, MD  Outpatient Specialists:   Brief Narrative:  Patient is a 53 year old African-American female with past medical history significant for morbid obesity, hypertension and diabetes mellitus.  Patient was admitted with chest pain.  Troponins have been negative.  Echocardiogram revealed EF of 45 to 50%, left ventricle global hypokinesis and grade 1 diastolic dysfunction.  EKG is noted.  Patient continues to report vague chest pain.  Cardiac stress test was concerning for inducible ischemia.  Will await cardiology team.  Further management will depend on hospital course.  Assessment & Plan:   Active Problems:   Essential hypertension   Type 2 diabetes mellitus without complication, without long-term current use of insulin (HCC)   Chest pain   Hypoglycemia   Syncope   Chest pain with abnormal cardiac stress test: -Cardiology team will see patient in the morning. -Patient continues to report vague chest symptoms -Keep patient n.p.o. after midnight. -Repeat troponins. -Repeat EKG for significant chest pain. -Echocardiogram revealed EF of 45 to 50% (down from 55 to 60%), and grade 1 diastolic dysfunction. -Further management will depend on hospital course.  Diabetes mellitus: -Continue to optimize. -HbA1c was 6.4%. -Hypoglycemia was documented on presentation, and glipizide and Ozempic are on hold. -Continue to monitor closely.  Currently, patient is on sliding scale insulin coverage.  Hypertension: -This is reasonably controlled. -Goal blood pressures less than 130/80 mmHg.  Anxiety/asthma:  -Chronic and stable.   DVT prophylaxis: Subcutaneous Lovenox Code Status: Full code Family Communication:  Disposition Plan: Home eventually   Consultants:   Cardiology will see patient  Procedures:   Cardiac stress test that is worrisome for inducible  ischemia.  Echocardiogram.  Antimicrobials:   None   Subjective: Vague chest pain. No shortness of breath  Objective: Vitals:   02/09/19 1956 02/09/19 2209 02/10/19 0414 02/10/19 1443  BP: 139/85  122/84 (!) 142/121  Pulse: 89 89 91 78  Resp: 18 16 18 18   Temp: 97.6 F (36.4 C)  98.3 F (36.8 C)   TempSrc: Oral     SpO2: 94% 97% 97% 100%  Weight:   94.5 kg   Height:        Intake/Output Summary (Last 24 hours) at 02/10/2019 1621 Last data filed at 02/10/2019 0600 Gross per 24 hour  Intake 751.67 ml  Output --  Net 751.67 ml   Filed Weights   02/09/19 0257 02/10/19 0414  Weight: 94.5 kg 94.5 kg    Examination:  General exam: Appears calm and comfortable. Respiratory system: Clear to auscultation.  Cardiovascular system: S1 & S2 heard.   Gastrointestinal system: Abdomen is obese, soft and nontender.  Organs are not palpable.   Central nervous system: Alert and oriented.  Patient moves all extremities.   Extremities: No leg edema  Data Reviewed: I have personally reviewed following labs and imaging studies  CBC: Recent Labs  Lab 02/08/19 2347 02/08/19 2357 02/09/19 0520  WBC 7.4  --  6.9  NEUTROABS 3.9  --   --   HGB 12.4 13.9 12.1  HCT 40.1 41.0 38.9  MCV 97.8  --  97.5  PLT 381  --  99991111   Basic Metabolic Panel: Recent Labs  Lab 02/08/19 2357 02/09/19 0520  NA 142 141  K 4.0 3.8  CL 109 108  CO2  --  25  GLUCOSE 64* 83  BUN 9 11  CREATININE 0.60 0.68  CALCIUM  --  8.0*   GFR: Estimated Creatinine Clearance: 81.7 mL/min (by C-G formula based on SCr of 0.68 mg/dL). Liver Function Tests: Recent Labs  Lab 02/09/19 0520  AST 26  ALT 38  ALKPHOS 59  BILITOT 0.5  PROT 6.3*  ALBUMIN 3.5   No results for input(s): LIPASE, AMYLASE in the last 168 hours. No results for input(s): AMMONIA in the last 168 hours. Coagulation Profile: No results for input(s): INR, PROTIME in the last 168 hours. Cardiac Enzymes: No results for input(s):  CKTOTAL, CKMB, CKMBINDEX, TROPONINI in the last 168 hours. BNP (last 3 results) No results for input(s): PROBNP in the last 8760 hours. HbA1C: No results for input(s): HGBA1C in the last 72 hours. CBG: Recent Labs  Lab 02/09/19 1954 02/10/19 0034 02/10/19 0412 02/10/19 0741 02/10/19 1447  GLUCAP 94 128* 102* 94 79   Lipid Profile: No results for input(s): CHOL, HDL, LDLCALC, TRIG, CHOLHDL, LDLDIRECT in the last 72 hours. Thyroid Function Tests: No results for input(s): TSH, T4TOTAL, FREET4, T3FREE, THYROIDAB in the last 72 hours. Anemia Panel: No results for input(s): VITAMINB12, FOLATE, FERRITIN, TIBC, IRON, RETICCTPCT in the last 72 hours. Urine analysis:    Component Value Date/Time   COLORURINE YELLOW 01/25/2013 0815   APPEARANCEUR CLEAR 01/25/2013 0815   LABSPEC 1.029 01/25/2013 0815   PHURINE 5.5 01/25/2013 0815   GLUCOSEU NEGATIVE 01/25/2013 0815   HGBUR NEGATIVE 01/25/2013 0815   BILIRUBINUR NEGATIVE 01/25/2013 0815   KETONESUR NEGATIVE 01/25/2013 0815   PROTEINUR NEGATIVE 01/25/2013 0815   UROBILINOGEN 0.2 01/25/2013 0815   NITRITE NEGATIVE 01/25/2013 0815   LEUKOCYTESUR NEGATIVE 01/25/2013 0815   Sepsis Labs: @LABRCNTIP (procalcitonin:4,lacticidven:4)  ) Recent Results (from the past 240 hour(s))  SARS CORONAVIRUS 2 (TAT 6-24 HRS) Nasopharyngeal Nasopharyngeal Swab     Status: None   Collection Time: 02/09/19  1:05 AM   Specimen: Nasopharyngeal Swab  Result Value Ref Range Status   SARS Coronavirus 2 NEGATIVE NEGATIVE Final    Comment: (NOTE) SARS-CoV-2 target nucleic acids are NOT DETECTED. The SARS-CoV-2 RNA is generally detectable in upper and lower respiratory specimens during the acute phase of infection. Negative results do not preclude SARS-CoV-2 infection, do not rule out co-infections with other pathogens, and should not be used as the sole basis for treatment or other patient management decisions. Negative results must be combined with clinical  observations, patient history, and epidemiological information. The expected result is Negative. Fact Sheet for Patients: SugarRoll.be Fact Sheet for Healthcare Providers: https://www.woods-mathews.com/ This test is not yet approved or cleared by the [place] FDA and  has been authorized for detection and/or diagnosis of SARS-CoV-2 by FDA under an Emergency Use Authorization (EUA). This EUA will remain  in effect (meaning this test can be used) for the duration of the COVID-19 declaration under Section 56 4(b)(1) of the Act, 21 U.S.C. section 360bbb-3(b)(1), unless the authorization is terminated or revoked sooner. Performed at [facility], [place] [address] [place], [place] [address]   MRSA PCR Screening     Status: None   Collection Time: 02/09/19  3:18 AM   Specimen: Nasopharyngeal  Result Value Ref Range Status   MRSA by PCR NEGATIVE NEGATIVE Final    Comment:        The GeneXpert MRSA Assay (FDA approved for NASAL specimens only), is one component of a comprehensive MRSA colonization surveillance program. It is not intended to diagnose MRSA infection nor to guide or monitor treatment for MRSA infections. Performed at [facility],  [place] [address], [place], [place] [address]          Radiology Studies: Ct Angio Chest Pe W And/or Wo Contrast  Result Date: 02/09/2019 CLINICAL DATA:  Chest pain EXAM: CT ANGIOGRAPHY CHEST WITH CONTRAST TECHNIQUE: Multidetector CT imaging of the chest was performed using the standard protocol during bolus administration of intravenous contrast. Multiplanar CT image reconstructions and MIPs were obtained to evaluate the vascular anatomy. CONTRAST:  146mL OMNIPAQUE IOHEXOL 350 MG/ML SOLN COMPARISON:  02/23/2009 FINDINGS: Cardiovascular: No filling defects in the pulmonary arteries to suggest pulmonary emboli. Heart is mildly enlarged. Aorta is normal caliber. No dissection.  Mediastinum/Nodes: No mediastinal, hilar, or axillary adenopathy. Trachea and esophagus are unremarkable. Thyroid unremarkable. Lungs/Pleura: Bibasilar atelectasis.  No effusions. Upper Abdomen: Imaging into the upper abdomen shows no acute findings. Musculoskeletal: Chest wall soft tissues are unremarkable. No acute bony abnormality. Review of the MIP images confirms the above findings. IMPRESSION: No evidence of pulmonary embolus. Mild cardiomegaly. Bibasilar atelectasis. Electronically Signed   By: Rolm Baptise M.D.   On: 02/09/2019 01:17   Nm Myocar Multi W/spect W/wall Motion / Ef  Result Date: 02/10/2019 CLINICAL DATA:  Chest discomfort.  ACS.  Hypertension and diabetes. EXAM: MYOCARDIAL IMAGING WITH SPECT (REST AND PHARMACOLOGIC-STRESS) GATED LEFT VENTRICULAR WALL MOTION STUDY LEFT VENTRICULAR EJECTION FRACTION TECHNIQUE: Standard myocardial SPECT imaging was performed after resting intravenous injection of 10 mCi Tc-70m tetrofosmin. Subsequently, intravenous infusion of Lexiscan was performed under the supervision of the Cardiology staff. At peak effect of the drug, 30 mCi Tc-41m tetrofosmin was injected intravenously and standard myocardial SPECT imaging was performed. Quantitative gated imaging was also performed to evaluate left ventricular wall motion, and estimate left ventricular ejection fraction. COMPARISON:  CT chest 02/09/2019 FINDINGS: Perfusion: Moderate-sized and moderate severity reduction in activity in the inferior wall on stress images compared to rest images. Quantitatively this measures up to a 13% reduction, and raises concern for inducible ischemia. Wall Motion: Mild septal wall poor wall thickening, and mild hypokinesis in the lateral wall. Left Ventricular Ejection Fraction: 41 % End diastolic volume 99991111 ml (mildly elevated) End systolic volume 65 ml (moderately elevated) IMPRESSION: 1. Moderate sized, moderate severity reversible activity in the inferior wall suspicious for  inducible ischemia. 2. Mild lateral hypokinesis and mild septal poor wall thickening. 3. Left ventricular ejection fraction 41% 4. Non invasive risk stratification*: High (on the basis of moderate-sized defect and mild to moderate left ventricular dilatation). *2012 Appropriate Use Criteria for Coronary Revascularization Focused Update: J Am Coll Cardiol. [id number]. http://[email or web address]?[email or web address] Electronically Signed   By: Van Clines M.D.   On: 02/10/2019 16:19   Dg Chest Portable 1 View  Result Date: 02/08/2019 CLINICAL DATA:  Chest pain, shortness of breath EXAM: PORTABLE CHEST 1 VIEW COMPARISON:  02/20/2018 FINDINGS: Heart is borderline enlarged. Mild vascular congestion. Interstitial prominence could reflect early interstitial edema. Bibasilar atelectasis. No effusions or acute bony abnormality. IMPRESSION: Borderline cardiomegaly with vascular congestion and interstitial prominence, question early interstitial edema. Bibasilar atelectasis. Electronically Signed   By: Rolm Baptise M.D.   On: 02/08/2019 23:19        Scheduled Meds:  aspirin  81 mg Oral Daily   atorvastatin  10 mg Oral QHS   busPIRone  15 mg Oral TID   carvedilol  3.125 mg Oral BID WC   diazepam  10 mg Oral QID   enoxaparin (LOVENOX) injection  40 mg Subcutaneous Q24H   fluticasone furoate-vilanterol  1 puff Inhalation Daily   insulin aspart  0-9 Units Subcutaneous Q4H   lisinopril  10 mg Oral Daily   montelukast  10 mg Oral Daily   pantoprazole  40 mg Oral Daily   polyethylene glycol  17 g Oral Daily   QUEtiapine  100 mg Oral QHS   rOPINIRole  1 mg Oral QHS   Continuous Infusions:   LOS: 1 day    Time spent: 25 minutes   Dana Allan, MD  [facility] Pager #: [phone] 7PM-7AM contact night coverage as above

## 2019-02-11 LAB — GLUCOSE, CAPILLARY
Glucose-Capillary: 107 mg/dL — ABNORMAL HIGH (ref 70–99)
Glucose-Capillary: 117 mg/dL — ABNORMAL HIGH (ref 70–99)
Glucose-Capillary: 119 mg/dL — ABNORMAL HIGH (ref 70–99)
Glucose-Capillary: 125 mg/dL — ABNORMAL HIGH (ref 70–99)
Glucose-Capillary: 89 mg/dL (ref 70–99)
Glucose-Capillary: 99 mg/dL (ref 70–99)

## 2019-02-11 LAB — TROPONIN I (HIGH SENSITIVITY)
Troponin I (High Sensitivity): <2 ng/L (ref <18)
Troponin I (High Sensitivity): <2 ng/L (ref <18)

## 2019-02-11 MED ORDER — DEXTROSE-NACL 5-0.45 % IV SOLN
INTRAVENOUS | Status: DC
  Administered 2019-02-11 – 2019-02-12 (×2): via INTRAVENOUS

## 2019-02-11 MED ORDER — FLUTICASONE FUROATE-VILANTEROL 200-25 MCG/INH IN AEPB
1.0000 | INHALATION_SPRAY | Freq: Every day | RESPIRATORY_TRACT | Status: DC
  Administered 2019-02-11 – 2019-02-12 (×2): 1 via RESPIRATORY_TRACT

## 2019-02-11 MED ORDER — SODIUM CHLORIDE 0.9% FLUSH
3.0000 mL | Freq: Two times a day (BID) | INTRAVENOUS | Status: DC
  Administered 2019-02-12 – 2019-02-13 (×2): 3 mL via INTRAVENOUS

## 2019-02-11 NOTE — Plan of Care (Signed)
Patient with no complaints of chest pain on 7 a to 7 p shift, tolerating heart healthy diet.  Awaiting cardiac cath on Monday 02/13/2019.

## 2019-02-11 NOTE — Progress Notes (Signed)
PROGRESS NOTE    [name]  [id number] DOB: Jul 12, 1965 DOA: 02/08/2019 PCP: Bernerd Limbo, MD  Outpatient Specialists:   Brief Narrative:  Patient is a 53 year old African-American female with past medical history significant for morbid obesity, hypertension and diabetes mellitus.  Patient was admitted with chest pain.  Troponins have been negative.  Echocardiogram revealed EF of 45 to 50%, left ventricle global hypokinesis and grade 1 diastolic dysfunction.  EKG is noted.  Patient continues to report vague chest pain.  Cardiac stress test was concerning for inducible ischemia.  Will await cardiology team.  Further management will depend on hospital course.  02/11/2019: Cardiology input is appreciated.  For cardiac catheterization on Monday, 02/13/2019.  No further chest pain reported.  Repeat troponin is negative.  Assessment & Plan:   Active Problems:   Essential hypertension   Type 2 diabetes mellitus without complication, without long-term current use of insulin (HCC)   Chest pain   Hypoglycemia   Syncope   Chest pain with abnormal cardiac stress test: -Cardiology input is appreciated.  For cardiac cath done Monday, 02/13/2019 -No chest pain reported. -Repeat troponins came back negative.. -Repeat EKG for significant chest pain. -Echocardiogram revealed EF of 45 to 50% (down from 55 to 60%), and grade 1 diastolic dysfunction. -Positive cardiac stress test. -Further management will depend on hospital course.  Diabetes mellitus: -Continue to optimize. -HbA1c was 6.4%. -Hypoglycemia was documented on presentation, and glipizide and Ozempic are on hold. -Continue to monitor closely.  Currently, patient is on sliding scale insulin coverage.  Hypertension: -This is reasonably controlled. -Goal blood pressures less than 130/80 mmHg.  Anxiety/asthma:  -Chronic and stable.   DVT prophylaxis: Subcutaneous Lovenox Code Status: Full code Family Communication:   Disposition Plan: Home eventually   Consultants:   Cardiology will see patient  Procedures:   Cardiac stress test that is worrisome for inducible ischemia.  Echocardiogram.  Antimicrobials:   None   Subjective: No chest pain No shortness of breath  Objective: Vitals:   02/10/19 1443 02/10/19 2117 02/11/19 0438 02/11/19 0813  BP: (!) 142/121 137/81 (!) 151/90   Pulse: 78 88 79 (!) 107  Resp: 18 18 18 18   Temp:  97.9 F (36.6 C) 97.9 F (36.6 C)   TempSrc:  Oral Oral   SpO2: 100% 100% 91% 100%  Weight:      Height:        Intake/Output Summary (Last 24 hours) at 02/11/2019 1256 Last data filed at 02/10/2019 2000 Gross per 24 hour  Intake 240 ml  Output -  Net 240 ml   Filed Weights   02/09/19 0257 02/10/19 0414  Weight: 94.5 kg 94.5 kg    Examination:  General exam: Appears calm and comfortable. Respiratory system: Clear to auscultation.  Cardiovascular system: S1 & S2 heard.   Gastrointestinal system: Abdomen is obese, soft and nontender.  Organs are not palpable.   Central nervous system: Alert and oriented.  Patient moves all extremities.   Extremities: No leg edema  Data Reviewed: I have personally reviewed following labs and imaging studies  CBC: Recent Labs  Lab 02/08/19 2347 02/08/19 2357 02/09/19 0520  WBC 7.4  --  6.9  NEUTROABS 3.9  --   --   HGB 12.4 13.9 12.1  HCT 40.1 41.0 38.9  MCV 97.8  --  97.5  PLT 381  --  99991111   Basic Metabolic Panel: Recent Labs  Lab 02/08/19 2357 02/09/19 0520  NA 142 141  K 4.0 3.8  CL 109 108  CO2  --  25  GLUCOSE 64* 83  BUN 9 11  CREATININE 0.60 0.68  CALCIUM  --  8.0*   GFR: Estimated Creatinine Clearance: 81.7 mL/min (by C-G formula based on SCr of 0.68 mg/dL). Liver Function Tests: Recent Labs  Lab 02/09/19 0520  AST 26  ALT 38  ALKPHOS 59  BILITOT 0.5  PROT 6.3*  ALBUMIN 3.5   No results for input(s): LIPASE, AMYLASE in the last 168 hours. No results for input(s): AMMONIA in  the last 168 hours. Coagulation Profile: No results for input(s): INR, PROTIME in the last 168 hours. Cardiac Enzymes: No results for input(s): CKTOTAL, CKMB, CKMBINDEX, TROPONINI in the last 168 hours. BNP (last 3 results) No results for input(s): PROBNP in the last 8760 hours. HbA1C: No results for input(s): HGBA1C in the last 72 hours. CBG: Recent Labs  Lab 02/10/19 2001 02/11/19 0033 02/11/19 0434 02/11/19 0820 02/11/19 1126  GLUCAP 92 125* 119* 117* 107*   Lipid Profile: No results for input(s): CHOL, HDL, LDLCALC, TRIG, CHOLHDL, LDLDIRECT in the last 72 hours. Thyroid Function Tests: No results for input(s): TSH, T4TOTAL, FREET4, T3FREE, THYROIDAB in the last 72 hours. Anemia Panel: No results for input(s): VITAMINB12, FOLATE, FERRITIN, TIBC, IRON, RETICCTPCT in the last 72 hours. Urine analysis:    Component Value Date/Time   COLORURINE YELLOW 01/25/2013 0815   APPEARANCEUR CLEAR 01/25/2013 0815   LABSPEC 1.029 01/25/2013 0815   PHURINE 5.5 01/25/2013 0815   GLUCOSEU NEGATIVE 01/25/2013 0815   HGBUR NEGATIVE 01/25/2013 0815   BILIRUBINUR NEGATIVE 01/25/2013 0815   KETONESUR NEGATIVE 01/25/2013 0815   PROTEINUR NEGATIVE 01/25/2013 0815   UROBILINOGEN 0.2 01/25/2013 0815   NITRITE NEGATIVE 01/25/2013 0815   LEUKOCYTESUR NEGATIVE 01/25/2013 0815   Sepsis Labs: @LABRCNTIP (procalcitonin:4,lacticidven:4)  ) Recent Results (from the past 240 hour(s))  SARS CORONAVIRUS 2 (TAT 6-24 HRS) Nasopharyngeal Nasopharyngeal Swab     Status: None   Collection Time: 02/09/19  1:05 AM   Specimen: Nasopharyngeal Swab  Result Value Ref Range Status   SARS Coronavirus 2 NEGATIVE NEGATIVE Final    Comment: (NOTE) SARS-CoV-2 target nucleic acids are NOT DETECTED. The SARS-CoV-2 RNA is generally detectable in upper and lower respiratory specimens during the acute phase of infection. Negative results do not preclude SARS-CoV-2 infection, do not rule out co-infections with other  pathogens, and should not be used as the sole basis for treatment or other patient management decisions. Negative results must be combined with clinical observations, patient history, and epidemiological information. The expected result is Negative. Fact Sheet for Patients: SugarRoll.be Fact Sheet for Healthcare Providers: https://www.woods-mathews.com/ This test is not yet approved or cleared by the [place] FDA and  has been authorized for detection and/or diagnosis of SARS-CoV-2 by FDA under an Emergency Use Authorization (EUA). This EUA will remain  in effect (meaning this test can be used) for the duration of the COVID-19 declaration under Section 56 4(b)(1) of the Act, 21 U.S.C. section 360bbb-3(b)(1), unless the authorization is terminated or revoked sooner. Performed at [facility], [place] [address], [place], [place] [address]   MRSA PCR Screening     Status: None   Collection Time: 02/09/19  3:18 AM   Specimen: Nasopharyngeal  Result Value Ref Range Status   MRSA by PCR NEGATIVE NEGATIVE Final    Comment:        The GeneXpert MRSA Assay (FDA approved for NASAL specimens only), is one component of a comprehensive MRSA  colonization surveillance program. It is not intended to diagnose MRSA infection nor to guide or monitor treatment for MRSA infections. Performed at [facility], [place] [address] [place], [place] [address]          Radiology Studies: Nm Myocar Multi W/spect W/wall Motion / Ef  Result Date: 02/10/2019 CLINICAL DATA:  Chest discomfort.  ACS.  Hypertension and diabetes. EXAM: MYOCARDIAL IMAGING WITH SPECT (REST AND PHARMACOLOGIC-STRESS) GATED LEFT VENTRICULAR WALL MOTION STUDY LEFT VENTRICULAR EJECTION FRACTION TECHNIQUE: Standard myocardial SPECT imaging was performed after resting intravenous injection of 10 mCi Tc-22m tetrofosmin. Subsequently, intravenous infusion of Lexiscan was  performed under the supervision of the Cardiology staff. At peak effect of the drug, 30 mCi Tc-54m tetrofosmin was injected intravenously and standard myocardial SPECT imaging was performed. Quantitative gated imaging was also performed to evaluate left ventricular wall motion, and estimate left ventricular ejection fraction. COMPARISON:  CT chest 02/09/2019 FINDINGS: Perfusion: Moderate-sized and moderate severity reduction in activity in the inferior wall on stress images compared to rest images. Quantitatively this measures up to a 13% reduction, and raises concern for inducible ischemia. Wall Motion: Mild septal wall poor wall thickening, and mild hypokinesis in the lateral wall. Left Ventricular Ejection Fraction: 41 % End diastolic volume 99991111 ml (mildly elevated) End systolic volume 65 ml (moderately elevated) IMPRESSION: 1. Moderate sized, moderate severity reversible activity in the inferior wall suspicious for inducible ischemia. 2. Mild lateral hypokinesis and mild septal poor wall thickening. 3. Left ventricular ejection fraction 41% 4. Non invasive risk stratification*: High (on the basis of moderate-sized defect and mild to moderate left ventricular dilatation). *2012 Appropriate Use Criteria for Coronary Revascularization Focused Update: J Am Coll Cardiol. [id number]. http://[email or web address]?[email or web address] Electronically Signed   By: Van Clines M.D.   On: 02/10/2019 16:19        Scheduled Meds: . aspirin  81 mg Oral Daily  . atorvastatin  10 mg Oral QHS  . busPIRone  15 mg Oral TID  . carvedilol  3.125 mg Oral BID WC  . diazepam  10 mg Oral QID  . enoxaparin (LOVENOX) injection  40 mg Subcutaneous Q24H  . fluticasone furoate-vilanterol  1 puff Inhalation Daily  . insulin aspart  0-9 Units Subcutaneous Q4H  . lisinopril  10 mg Oral Daily  . montelukast  10 mg Oral Daily  . pantoprazole  40 mg Oral Daily  . polyethylene glycol  17 g Oral Daily  .  QUEtiapine  100 mg Oral QHS  . rOPINIRole  1 mg Oral QHS  . sodium chloride flush  3 mL Intravenous Q12H   Continuous Infusions: . dextrose 5 % and 0.45% NaCl 30 mL/hr at 02/11/19 0100     LOS: 2 days    Time spent: 25 minutes   Dana Allan, MD  [facility] Pager #: [phone] 7PM-7AM contact night coverage as above

## 2019-02-11 NOTE — Consult Note (Signed)
CARDIOLOGY CONSULT NOTE       Patient ID: [name] MRN: [id number] DOB/AGE: [birth year] 53 y.o.  Admit date: 02/08/2019 Referring Physician: Marthenia Rolling Primary Physician: Bernerd Limbo, MD Primary Cardiologist: Agustin Cree  Reason for Consultation: Chest Pain  Active Problems:   Essential hypertension   Type 2 diabetes mellitus without complication, without long-term current use of insulin (HCC)   Chest pain   Hypoglycemia   Syncope   HPI:  53 y.o. history of PFO, HTN, OSA using CPAP. Increasing exertional dyspnea last 6 months. Admitted with somewhat atypical right sided chest pain radiating to neck Palpitations with event monitor 11/07/18 showing no significant arrhythmia infrequent PAC/PVC. Echo done this admission EF 45-50% Myovue reviewed from yesterday showed moderate area of inferior wall ischemia. She has r/o for MI and has no acute ECG changes. Discussed diagnostic cath on Monday with patient Risks including stroke MI, emergent surgery and bleeding Willing to proceed. 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 with allergies/sinus issues   History of colon polyps 04/2016   History of MRSA infection 2007   Hypertension    no problems since weight loss -'09   Multiple food allergies    Latex allergy-respiratory   Obesity    Tendonitis    Vitamin D deficiency    history of    Family History  Problem Relation Age of Onset   Lung cancer Mother    Diabetes Father    Colon cancer Neg Hx     Social History   Socioeconomic History     Marital status: Married    Spouse name: antonio   Number of children: 6   Years of education: college   Highest education level: Not on file  Occupational History   Occupation: N/A  Scientist, product/process development strain: Not on file   Food insecurity    Worry: Not on file    Inability: Not on file   Transportation needs    Medical: Not on file    Non-medical: Not on file  Tobacco Use   Smoking status: Former Smoker    Quit date: 01/25/2005    Years since quitting: 14.0   Smokeless tobacco: Never Used  Substance and Sexual Activity   Alcohol use: No   Drug use: No   Sexual activity: Yes    Partners: Male  Lifestyle   Physical activity    Days per week: Not on file    Minutes per session: Not on file   Stress: Not on file  Relationships   Social connections    Talks on phone: Not on file    Gets together: Not on file    Attends religious service: Not on file    Active member of club or organization: Not on file    Attends meetings of clubs or organizations: Not on file    Relationship status: Not on file   Intimate partner violence    Fear of current or ex partner: Not on file  Emotionally abused: Not on file    Physically abused: Not on file    Forced sexual activity: Not on file  Other Topics Concern   Not on file  Social History Narrative   Not on file    Past Surgical History:  Procedure Laterality Date   ABDOMINAL HYSTERECTOMY     BOWEL RESECTION     CESAREAN SECTION     x4   COLONOSCOPY W/ POLYPECTOMY  04/2016   KNEE SURGERY     Right knee scope 8'12   TOTAL KNEE ARTHROPLASTY Right 01/31/2013   Procedure: RIGHT TOTAL KNEE ARTHROPLASTY;  Surgeon: [name], MD;  Location: [facility];  Service: Orthopedics;  Laterality: Right;   TOTAL KNEE ARTHROPLASTY Left 02/15/2018   Procedure: LEFT TOTAL KNEE ARTHROPLASTY;  Surgeon: Paralee Cancel, MD;  Location: [facility];  Service: Orthopedics;  Laterality: Left;  70      aspirin   81 mg Oral Daily   atorvastatin  10 mg Oral QHS   busPIRone  15 mg Oral TID   carvedilol  3.125 mg Oral BID WC   diazepam  10 mg Oral QID   enoxaparin (LOVENOX) injection  40 mg Subcutaneous Q24H   fluticasone furoate-vilanterol  1 puff Inhalation Daily   insulin aspart  0-9 Units Subcutaneous Q4H   lisinopril  10 mg Oral Daily   montelukast  10 mg Oral Daily   pantoprazole  40 mg Oral Daily   polyethylene glycol  17 g Oral Daily   QUEtiapine  100 mg Oral QHS   rOPINIRole  1 mg Oral QHS   sodium chloride flush  3 mL Intravenous Q12H    dextrose 5 % and 0.45% NaCl 30 mL/hr at 02/11/19 0100    Physical Exam: Blood pressure (!) 151/90, pulse (!) 107, temperature 97.9 F (36.6 C), temperature source Oral, resp. rate 18, height 5' (1.524 m), weight 94.5 kg, SpO2 100 %.    Affect appropriate Healthy:  appears stated age 53: normal Neck supple with no adenopathy JVP normal no bruits no thyromegaly Lungs clear with no wheezing and good diaphragmatic motion Heart:  S1/S2 no murmur, no rub, gallop or click PMI normal Abdomen: benighn, BS positve, no tenderness, no AAA no bruit.  No HSM or HJR Distal pulses intact with no bruits No edema Neuro non-focal Skin warm and dry No muscular weakness   Labs:   Lab Results  Component Value Date   WBC 6.9 02/09/2019   HGB 12.1 02/09/2019   HCT 38.9 02/09/2019   MCV 97.5 02/09/2019   PLT 365 02/09/2019    Recent Labs  Lab 02/09/19 0520  NA 141  K 3.8  CL 108  CO2 25  BUN 11  CREATININE 0.68  CALCIUM 8.0*  PROT 6.3*  BILITOT 0.5  ALKPHOS 59  ALT 38  AST 26  GLUCOSE 83   Lab Results  Component Value Date   CKTOTAL 47 01/05/2010   CKMB 0.6 01/05/2010   TROPONINI <0.30 11/27/2012    Lab Results  Component Value Date   CHOL 173 11/27/2012   CHOL 160 03/06/2009   CHOL  02/24/2009    135        ATP III CLASSIFICATION:  <200     mg/dL   Desirable  200-239  mg/dL   Borderline High  >=240    mg/dL    High          Lab Results  Component Value Date   HDL 49 11/27/2012  HDL 55.10 03/06/2009   HDL 55 02/24/2009   Lab Results  Component Value Date   LDLCALC 98 11/27/2012   LDLCALC 79 03/06/2009   LDLCALC  02/24/2009    64        Total Cholesterol/HDL:CHD Risk Coronary Heart Disease Risk Table                     Men   Women  1/2 Average Risk   3.4   3.3  Average Risk       5.0   4.4  2 X Average Risk   9.6   7.1  3 X Average Risk  23.4   11.0        Use the calculated Patient Ratio above and the CHD Risk Table to determine the patient's CHD Risk.        ATP III CLASSIFICATION (LDL):  <100     mg/dL   Optimal  100-129  mg/dL   Near or Above                    Optimal  130-159  mg/dL   Borderline  160-189  mg/dL   High  >190     mg/dL   Very High   Lab Results  Component Value Date   TRIG 128 11/27/2012   TRIG 132.0 03/06/2009   TRIG 82 02/24/2009   Lab Results  Component Value Date   CHOLHDL 3.5 11/27/2012   CHOLHDL 3 03/06/2009   CHOLHDL 2.5 02/24/2009   No results found for: LDLDIRECT    Radiology: Ct Angio Chest Pe W And/or Wo Contrast  Result Date: 02/09/2019 CLINICAL DATA:  Chest pain EXAM: CT ANGIOGRAPHY CHEST WITH CONTRAST TECHNIQUE: Multidetector CT imaging of the chest was performed using the standard protocol during bolus administration of intravenous contrast. Multiplanar CT image reconstructions and MIPs were obtained to evaluate the vascular anatomy. CONTRAST:  142mL OMNIPAQUE IOHEXOL 350 MG/ML SOLN COMPARISON:  02/23/2009 FINDINGS: Cardiovascular: No filling defects in the pulmonary arteries to suggest pulmonary emboli. Heart is mildly enlarged. Aorta is normal caliber. No dissection. Mediastinum/Nodes: No mediastinal, hilar, or axillary adenopathy. Trachea and esophagus are unremarkable. Thyroid unremarkable. Lungs/Pleura: Bibasilar atelectasis.  No effusions. Upper Abdomen: Imaging into the upper abdomen shows no acute findings. Musculoskeletal:  Chest wall soft tissues are unremarkable. No acute bony abnormality. Review of the MIP images confirms the above findings. IMPRESSION: No evidence of pulmonary embolus. Mild cardiomegaly. Bibasilar atelectasis. Electronically Signed   By: Rolm Baptise M.D.   On: 02/09/2019 01:17   Nm Myocar Multi W/spect W/wall Motion / Ef  Result Date: 02/10/2019 CLINICAL DATA:  Chest discomfort.  ACS.  Hypertension and diabetes. EXAM: MYOCARDIAL IMAGING WITH SPECT (REST AND PHARMACOLOGIC-STRESS) GATED LEFT VENTRICULAR WALL MOTION STUDY LEFT VENTRICULAR EJECTION FRACTION TECHNIQUE: Standard myocardial SPECT imaging was performed after resting intravenous injection of 10 mCi Tc-55m tetrofosmin. Subsequently, intravenous infusion of Lexiscan was performed under the supervision of the Cardiology staff. At peak effect of the drug, 30 mCi Tc-25m tetrofosmin was injected intravenously and standard myocardial SPECT imaging was performed. Quantitative gated imaging was also performed to evaluate left ventricular wall motion, and estimate left ventricular ejection fraction. COMPARISON:  CT chest 02/09/2019 FINDINGS: Perfusion: Moderate-sized and moderate severity reduction in activity in the inferior wall on stress images compared to rest images. Quantitatively this measures up to a 13% reduction, and raises concern for inducible ischemia. Wall Motion: Mild septal wall poor wall thickening, and mild hypokinesis  in the lateral wall. Left Ventricular Ejection Fraction: 41 % End diastolic volume 99991111 ml (mildly elevated) End systolic volume 65 ml (moderately elevated) IMPRESSION: 1. Moderate sized, moderate severity reversible activity in the inferior wall suspicious for inducible ischemia. 2. Mild lateral hypokinesis and mild septal poor wall thickening. 3. Left ventricular ejection fraction 41% 4. Non invasive risk stratification*: High (on the basis of moderate-sized defect and mild to moderate left ventricular dilatation). *2012  Appropriate Use Criteria for Coronary Revascularization Focused Update: J Am Coll Cardiol. [id number]. http://content.[email or web address].aspx?articleid=[email or web address] Electronically Signed   By: Van Clines M.D.   On: 02/10/2019 16:19   Dg Chest Portable 1 View  Result Date: 02/08/2019 CLINICAL DATA:  Chest pain, shortness of breath EXAM: PORTABLE CHEST 1 VIEW COMPARISON:  02/20/2018 FINDINGS: Heart is borderline enlarged. Mild vascular congestion. Interstitial prominence could reflect early interstitial edema. Bibasilar atelectasis. No effusions or acute bony abnormality. IMPRESSION: Borderline cardiomegaly with vascular congestion and interstitial prominence, question early interstitial edema. Bibasilar atelectasis. Electronically Signed   By: Rolm Baptise M.D.   On: 02/08/2019 23:19    EKG: SR isolated T inversion lead 3   ASSESSMENT AND PLAN:   1. Chest Pain: atypical but echo with decreased EF 45-50% and myovue suggesting inferior wall ischemia will arrange for cath at [facility] Monday orders written for Dr Claiborne Billings continue coreg asa and statin  2. DM:  Discussed low carb diet.  Target hemoglobin A1c is 6.5 or less.  Continue current medications.  3. HLD:  Continue Lipitor target LDL 70 or less   4. PFO:  Not seen on current echo but no bubble study done not clinically relevant at this time  Signed: Jenkins Rouge 02/11/2019, 8:16 AM

## 2019-02-11 NOTE — Progress Notes (Signed)
Pt. Prefers to not use our machine and mask.  She is not sleeping well and only using for a few hours before she removes it.  Husband will be bringing her device in for her to use.  Will be available if she changes her mind.

## 2019-02-12 LAB — GLUCOSE, CAPILLARY
Glucose-Capillary: 102 mg/dL — ABNORMAL HIGH (ref 70–99)
Glucose-Capillary: 113 mg/dL — ABNORMAL HIGH (ref 70–99)
Glucose-Capillary: 117 mg/dL — ABNORMAL HIGH (ref 70–99)
Glucose-Capillary: 122 mg/dL — ABNORMAL HIGH (ref 70–99)
Glucose-Capillary: 130 mg/dL — ABNORMAL HIGH (ref 70–99)
Glucose-Capillary: 98 mg/dL (ref 70–99)

## 2019-02-12 MED ORDER — TRAMADOL HCL 50 MG PO TABS
50.0000 mg | ORAL_TABLET | Freq: Once | ORAL | Status: AC
  Administered 2019-02-12: 50 mg via ORAL
  Filled 2019-02-12: qty 1

## 2019-02-12 MED ORDER — TRAMADOL HCL 50 MG PO TABS: 50.0000 mg | ORAL_TABLET | Freq: Once | ORAL | Status: DC

## 2019-02-12 NOTE — Progress Notes (Signed)
Husband did not visit today and therefore patient does not have her home CPAP device.  Pt. Will call this writer if she decides to wear our device.  Will be available if needed.

## 2019-02-12 NOTE — Progress Notes (Signed)
PROGRESS NOTE    [name]  [id number] DOB: 05-Mar-1966 DOA: 02/08/2019 PCP: Bernerd Limbo, MD  Outpatient Specialists:   Brief Narrative:  Patient is a 53 year old African-American female with past medical history significant for morbid obesity, hypertension and diabetes mellitus.  Patient was admitted with chest pain.  Troponins have been negative.  Echocardiogram revealed EF of 45 to 50%, left ventricle global hypokinesis and grade 1 diastolic dysfunction.  EKG is noted.  Patient continues to report vague chest pain.  Cardiac stress test was concerning for inducible ischemia.  Will await cardiology team.  Further management will depend on hospital course.  02/11/2019: Cardiology input is appreciated.  For cardiac catheterization on Monday, 02/13/2019.  No further chest pain reported.  Repeat troponin is negative.  02/12/2019: Patient seen.  No new changes.  No chest pain.  For cardiac catheterization tomorrow.  N.p.o. after midnight.  Assessment & Plan:   Active Problems:   Essential hypertension   Type 2 diabetes mellitus without complication, without long-term current use of insulin (HCC)   Chest pain   Hypoglycemia   Syncope   Chest pain with abnormal cardiac stress test: -Cardiology input is appreciated.  For cardiac cath done Monday, 02/13/2019 -No chest pain reported. -Repeat troponins came back negative.. -Repeat EKG for significant chest pain. -Echocardiogram revealed EF of 45 to 50% (down from 55 to 60%), and grade 1 diastolic dysfunction. -Positive cardiac stress test. -Further management will depend on hospital course. 02/12/2019: Cardiology input is appreciated.  For cardiac stress test tomorrow.  N.p.o. after midnight.  Diabetes mellitus: -Continue to optimize. -HbA1c was 6.4%. -Hypoglycemia was documented on presentation, and glipizide and Ozempic are on hold. -Continue to monitor closely.  Currently, patient is on sliding scale insulin coverage.   Hypertension: -This is reasonably controlled. -Goal blood pressures less than 130/80 mmHg. 02/12/2019: Blood pressures well controlled.  Anxiety/asthma:  -Chronic and stable.   DVT prophylaxis: Subcutaneous Lovenox Code Status: Full code Family Communication:  Disposition Plan: Home eventually   Consultants:   Cardiology will see patient  Procedures:   Cardiac stress test that is worrisome for inducible ischemia.  Echocardiogram.  Antimicrobials:   None   Subjective: No chest pain No shortness of breath  Objective: Vitals:   02/11/19 1506 02/11/19 2032 02/12/19 0411 02/12/19 0826  BP: (!) 126/92 119/87 130/80   Pulse:  100 89 88  Resp:  16 16 16   Temp:  98 F (36.7 C) 97.9 F (36.6 C)   TempSrc:  Oral    SpO2:  100% 98% 97%  Weight:      Height:        Intake/Output Summary (Last 24 hours) at 02/12/2019 1100 Last data filed at 02/11/2019 1800 Gross per 24 hour  Intake 240.2 ml  Output -  Net 240.2 ml   Filed Weights   02/09/19 0257 02/10/19 0414  Weight: 94.5 kg 94.5 kg    Examination:  General exam: Appears calm and comfortable. Respiratory system: Clear to auscultation.  Cardiovascular system: S1 & S2 heard.   Gastrointestinal system: Abdomen is obese, soft and nontender.  Organs are not palpable.   Central nervous system: Alert and oriented.  Patient moves all extremities.   Extremities: No leg edema  Data Reviewed: I have personally reviewed following labs and imaging studies  CBC: Recent Labs  Lab 02/08/19 2347 02/08/19 2357 02/09/19 0520  WBC 7.4  --  6.9  NEUTROABS 3.9  --   --   HGB 12.4 13.9 12.1  HCT 40.1 41.0 38.9  MCV 97.8  --  97.5  PLT 381  --  99991111   Basic Metabolic Panel: Recent Labs  Lab 02/08/19 2357 02/09/19 0520  NA 142 141  K 4.0 3.8  CL 109 108  CO2  --  25  GLUCOSE 64* 83  BUN 9 11  CREATININE 0.60 0.68  CALCIUM  --  8.0*   GFR: Estimated Creatinine Clearance: 81.7 mL/min (by C-G formula based on  SCr of 0.68 mg/dL). Liver Function Tests: Recent Labs  Lab 02/09/19 0520  AST 26  ALT 38  ALKPHOS 59  BILITOT 0.5  PROT 6.3*  ALBUMIN 3.5   No results for input(s): LIPASE, AMYLASE in the last 168 hours. No results for input(s): AMMONIA in the last 168 hours. Coagulation Profile: No results for input(s): INR, PROTIME in the last 168 hours. Cardiac Enzymes: No results for input(s): CKTOTAL, CKMB, CKMBINDEX, TROPONINI in the last 168 hours. BNP (last 3 results) No results for input(s): PROBNP in the last 8760 hours. HbA1C: No results for input(s): HGBA1C in the last 72 hours. CBG: Recent Labs  Lab 02/11/19 1635 02/11/19 2029 02/12/19 0038 02/12/19 0406 02/12/19 0804  GLUCAP 89 99 98 130* 122*   Lipid Profile: No results for input(s): CHOL, HDL, LDLCALC, TRIG, CHOLHDL, LDLDIRECT in the last 72 hours. Thyroid Function Tests: No results for input(s): TSH, T4TOTAL, FREET4, T3FREE, THYROIDAB in the last 72 hours. Anemia Panel: No results for input(s): VITAMINB12, FOLATE, FERRITIN, TIBC, IRON, RETICCTPCT in the last 72 hours. Urine analysis:    Component Value Date/Time   COLORURINE YELLOW 01/25/2013 0815   APPEARANCEUR CLEAR 01/25/2013 0815   LABSPEC 1.029 01/25/2013 0815   PHURINE 5.5 01/25/2013 0815   GLUCOSEU NEGATIVE 01/25/2013 0815   HGBUR NEGATIVE 01/25/2013 0815   BILIRUBINUR NEGATIVE 01/25/2013 0815   KETONESUR NEGATIVE 01/25/2013 0815   PROTEINUR NEGATIVE 01/25/2013 0815   UROBILINOGEN 0.2 01/25/2013 0815   NITRITE NEGATIVE 01/25/2013 0815   LEUKOCYTESUR NEGATIVE 01/25/2013 0815   Sepsis Labs: @LABRCNTIP (procalcitonin:4,lacticidven:4)  ) Recent Results (from the past 240 hour(s))  SARS CORONAVIRUS 2 (TAT 6-24 HRS) Nasopharyngeal Nasopharyngeal Swab     Status: None   Collection Time: 02/09/19  1:05 AM   Specimen: Nasopharyngeal Swab  Result Value Ref Range Status   SARS Coronavirus 2 NEGATIVE NEGATIVE Final    Comment: (NOTE) SARS-CoV-2 target  nucleic acids are NOT DETECTED. The SARS-CoV-2 RNA is generally detectable in upper and lower respiratory specimens during the acute phase of infection. Negative results do not preclude SARS-CoV-2 infection, do not rule out co-infections with other pathogens, and should not be used as the sole basis for treatment or other patient management decisions. Negative results must be combined with clinical observations, patient history, and epidemiological information. The expected result is Negative. Fact Sheet for Patients: SugarRoll.be Fact Sheet for Healthcare Providers: https://www.woods-mathews.com/ This test is not yet approved or cleared by the [place] FDA and  has been authorized for detection and/or diagnosis of SARS-CoV-2 by FDA under an Emergency Use Authorization (EUA). This EUA will remain  in effect (meaning this test can be used) for the duration of the COVID-19 declaration under Section 56 4(b)(1) of the Act, 21 U.S.C. section 360bbb-3(b)(1), unless the authorization is terminated or revoked sooner. Performed at [facility], [place] [address], [place],  [address]   MRSA PCR Screening     Status: None   Collection Time: 02/09/19  3:18 AM   Specimen: Nasopharyngeal  Result  Value Ref Range Status   MRSA by PCR NEGATIVE NEGATIVE Final    Comment:        The GeneXpert MRSA Assay (FDA approved for NASAL specimens only), is one component of a comprehensive MRSA colonization surveillance program. It is not intended to diagnose MRSA infection nor to guide or monitor treatment for MRSA infections. Performed at [facility], [place] [address] [place], [place] [address]          Radiology Studies: Nm Myocar Multi W/spect W/wall Motion / Ef  Result Date: 02/10/2019 CLINICAL DATA:  Chest discomfort.  ACS.  Hypertension and diabetes. EXAM: MYOCARDIAL IMAGING WITH SPECT (REST AND PHARMACOLOGIC-STRESS)  GATED LEFT VENTRICULAR WALL MOTION STUDY LEFT VENTRICULAR EJECTION FRACTION TECHNIQUE: Standard myocardial SPECT imaging was performed after resting intravenous injection of 10 mCi Tc-65m tetrofosmin. Subsequently, intravenous infusion of Lexiscan was performed under the supervision of the Cardiology staff. At peak effect of the drug, 30 mCi Tc-31m tetrofosmin was injected intravenously and standard myocardial SPECT imaging was performed. Quantitative gated imaging was also performed to evaluate left ventricular wall motion, and estimate left ventricular ejection fraction. COMPARISON:  CT chest 02/09/2019 FINDINGS: Perfusion: Moderate-sized and moderate severity reduction in activity in the inferior wall on stress images compared to rest images. Quantitatively this measures up to a 13% reduction, and raises concern for inducible ischemia. Wall Motion: Mild septal wall poor wall thickening, and mild hypokinesis in the lateral wall. Left Ventricular Ejection Fraction: 41 % End diastolic volume 99991111 ml (mildly elevated) End systolic volume 65 ml (moderately elevated) IMPRESSION: 1. Moderate sized, moderate severity reversible activity in the inferior wall suspicious for inducible ischemia. 2. Mild lateral hypokinesis and mild septal poor wall thickening. 3. Left ventricular ejection fraction 41% 4. Non invasive risk stratification*: High (on the basis of moderate-sized defect and mild to moderate left ventricular dilatation). *2012 Appropriate Use Criteria for Coronary Revascularization Focused Update: J Am Coll Cardiol. [id number]. http://[email or web address].[email or web address].aspx?articleid=[email or web address] Electronically Signed   By: Van Clines M.D.   On: 02/10/2019 16:19        Scheduled Meds: . aspirin  81 mg Oral Daily  . atorvastatin  10 mg Oral QHS  . busPIRone  15 mg Oral TID  . carvedilol  3.125 mg Oral BID WC  . diazepam  10 mg Oral QID  . enoxaparin (LOVENOX) injection  40 mg Subcutaneous Q24H   . fluticasone furoate-vilanterol  1 puff Inhalation Daily  . insulin aspart  0-9 Units Subcutaneous Q4H  . lisinopril  10 mg Oral Daily  . montelukast  10 mg Oral Daily  . pantoprazole  40 mg Oral Daily  . polyethylene glycol  17 g Oral Daily  . QUEtiapine  100 mg Oral QHS  . rOPINIRole  1 mg Oral QHS  . sodium chloride flush  3 mL Intravenous Q12H   Continuous Infusions: . dextrose 5 % and 0.45% NaCl 30 mL/hr at 02/11/19 0100     LOS: 3 days    Time spent: 25 minutes   Dana Allan, MD  [facility] Pager #: [phone] 7PM-7AM contact night coverage as above

## 2019-02-12 NOTE — Plan of Care (Signed)
Patient denies chest pain on 7 a to 7 p shift, awaiting cardiac cath 02/13/2019.  Consent signed and on chart.  Patient verbalizes understanding she is nothing by mouth starting 0500 02/13/2019.

## 2019-02-12 NOTE — Progress Notes (Signed)
Subjective:  Denies SSCP, palpitations or Dyspnea Getting her CPAP machine from home   Objective:  Vitals:   02/11/19 1458 02/11/19 1506 02/11/19 2032 02/12/19 0411  BP: (!) 145/111 (!) 126/92 119/87 130/80  Pulse: 94  100 89  Resp:   16 16  Temp: 98.3 F (36.8 C)  98 F (36.7 C) 97.9 F (36.6 C)  TempSrc: Oral  Oral   SpO2: 100%  100% 98%  Weight:      Height:        Intake/Output from previous day:  Intake/Output Summary (Last 24 hours) at 02/12/2019 [id number] Last data filed at 02/11/2019 1800 Gross per 24 hour  Intake 509.75 ml  Output -  Net 509.75 ml    Physical Exam: Affect appropriate Obese black female  HEENT: normal Neck supple with no adenopathy JVP normal no bruits no thyromegaly Lungs clear with no wheezing and good diaphragmatic motion Heart:  S1/S2 no murmur, no rub, gallop or click PMI normal Abdomen: benighn, BS positve, no tenderness, no AAA no bruit.  No HSM or HJR Distal pulses intact with no bruits No edema Neuro non-focal Skin warm and dry No muscular weakness   Imaging: Nm Myocar Multi W/spect W/wall Motion / Ef  Result Date: 02/10/2019 CLINICAL DATA:  Chest discomfort.  ACS.  Hypertension and diabetes. EXAM: MYOCARDIAL IMAGING WITH SPECT (REST AND PHARMACOLOGIC-STRESS) GATED LEFT VENTRICULAR WALL MOTION STUDY LEFT VENTRICULAR EJECTION FRACTION TECHNIQUE: Standard myocardial SPECT imaging was performed after resting intravenous injection of 10 mCi Tc-28m tetrofosmin. Subsequently, intravenous infusion of Lexiscan was performed under the supervision of the Cardiology staff. At peak effect of the drug, 30 mCi Tc-36m tetrofosmin was injected intravenously and standard myocardial SPECT imaging was performed. Quantitative gated imaging was also performed to evaluate left ventricular wall motion, and estimate left ventricular ejection fraction. COMPARISON:  CT chest 02/09/2019 FINDINGS: Perfusion: Moderate-sized and moderate severity reduction in  activity in the inferior wall on stress images compared to rest images. Quantitatively this measures up to a 13% reduction, and raises concern for inducible ischemia. Wall Motion: Mild septal wall poor wall thickening, and mild hypokinesis in the lateral wall. Left Ventricular Ejection Fraction: 41 % End diastolic volume 99991111 ml (mildly elevated) End systolic volume 65 ml (moderately elevated) IMPRESSION: 1. Moderate sized, moderate severity reversible activity in the inferior wall suspicious for inducible ischemia. 2. Mild lateral hypokinesis and mild septal poor wall thickening. 3. Left ventricular ejection fraction 41% 4. Non invasive risk stratification*: High (on the basis of moderate-sized defect and mild to moderate left ventricular dilatation). *2012 Appropriate Use Criteria for Coronary Revascularization Focused Update: J Am Coll Cardiol. [id number]. http://[email or web address].[email or web address]?[email or web address] Electronically Signed   By: Van Clines M.D.   On: 02/10/2019 16:19    Cardiac Studies:  ECG: SR T inversion lead 3 in setting of S wave    Telemetry:  NSR 02/12/2019   Echo: EF 45-50% reviewed   Medications:   . aspirin  81 mg Oral Daily  . atorvastatin  10 mg Oral QHS  . busPIRone  15 mg Oral TID  . carvedilol  3.125 mg Oral BID WC  . diazepam  10 mg Oral QID  . enoxaparin (LOVENOX) injection  40 mg Subcutaneous Q24H  . fluticasone furoate-vilanterol  1 puff Inhalation Daily  . insulin aspart  0-9 Units Subcutaneous Q4H  . lisinopril  10 mg Oral Daily  . montelukast  10 mg Oral Daily  . pantoprazole  40 mg Oral Daily  . polyethylene  glycol  17 g Oral Daily  . QUEtiapine  100 mg Oral QHS  . rOPINIRole  1 mg Oral QHS  . sodium chloride flush  3 mL Intravenous Q12H     . dextrose 5 % and 0.45% NaCl 30 mL/hr at 02/11/19 0100    Assessment/Plan:   1. Chest Pain: atypical but echo with decreased EF 45-50% and myovue suggesting inferior wall ischemia will  arrange for cath at [facility] Monday orders written for Dr Claiborne Billings continue coreg asa and statin  2. DM:  Discussed low carb diet.  Target hemoglobin A1c is 6.5 or less.  Continue current medications.  3. HLD:  Continue Lipitor target LDL 70 or less   4. PFO:  Not seen on current echo but no bubble study done not clinically relevant at this time  Jenkins Rouge 02/12/2019, 7:42 AM

## 2019-02-13 ENCOUNTER — Encounter (HOSPITAL_COMMUNITY)
Admission: EM | Disposition: A | Discharge status: Home / Self Care | Payer: Self-pay | Source: Home / Self Care | Attending: Internal Medicine

## 2019-02-13 DIAGNOSIS — R9439 Abnormal result of other cardiovascular function study: Secondary | ICD-10-CM

## 2019-02-13 DIAGNOSIS — I200 Unstable angina: Secondary | ICD-10-CM

## 2019-02-13 DIAGNOSIS — E785 Hyperlipidemia, unspecified: Secondary | ICD-10-CM

## 2019-02-13 DIAGNOSIS — R072 Precordial pain: Secondary | ICD-10-CM

## 2019-02-13 HISTORY — PX: LEFT HEART CATH AND CORONARY ANGIOGRAPHY: CATH118249

## 2019-02-13 LAB — BASIC METABOLIC PANEL
Anion gap: 9 (ref 5–15)
BUN: 12 mg/dL (ref 6–20)
CO2: 25 mmol/L (ref 22–32)
Calcium: 8.9 mg/dL (ref 8.9–10.3)
Chloride: 105 mmol/L (ref 98–111)
Creatinine, Ser: 0.64 mg/dL (ref 0.44–1.00)
GFR calc Af Amer: >60 mL/min (ref >60)
GFR calc non Af Amer: >60 mL/min (ref >60)
Glucose, Bld: 107 mg/dL — ABNORMAL HIGH (ref 70–99)
Potassium: 4.4 mmol/L (ref 3.5–5.1)
Sodium: 139 mmol/L (ref 135–145)

## 2019-02-13 LAB — LIPID PANEL
Cholesterol: 113 mg/dL (ref 0–200)
HDL: 37 mg/dL — ABNORMAL LOW (ref >40)
LDL Cholesterol: 43 mg/dL (ref 0–99)
Total CHOL/HDL Ratio: 3.1 RATIO
Triglycerides: 164 mg/dL — ABNORMAL HIGH (ref <150)
VLDL: 33 mg/dL (ref 0–40)

## 2019-02-13 LAB — CBC
HCT: 43.4 % (ref 36.0–46.0)
Hemoglobin: 13.4 g/dL (ref 12.0–15.0)
MCH: 30 pg (ref 26.0–34.0)
MCHC: 30.9 g/dL (ref 30.0–36.0)
MCV: 97.1 fL (ref 80.0–100.0)
Platelets: 394 10*3/uL (ref 150–400)
RBC: 4.47 MIL/uL (ref 3.87–5.11)
RDW: 14.5 % (ref 11.5–15.5)
WBC: 7.8 10*3/uL (ref 4.0–10.5)
nRBC: 0 % (ref 0.0–0.2)

## 2019-02-13 LAB — GLUCOSE, CAPILLARY
Glucose-Capillary: 123 mg/dL — ABNORMAL HIGH (ref 70–99)
Glucose-Capillary: 125 mg/dL — ABNORMAL HIGH (ref 70–99)
Glucose-Capillary: 149 mg/dL — ABNORMAL HIGH (ref 70–99)
Glucose-Capillary: 57 mg/dL — ABNORMAL LOW (ref 70–99)
Glucose-Capillary: 90 mg/dL (ref 70–99)
Glucose-Capillary: 95 mg/dL (ref 70–99)

## 2019-02-13 SURGERY — LEFT HEART CATH AND CORONARY ANGIOGRAPHY
Anesthesia: LOCAL

## 2019-02-13 MED ORDER — HYDRALAZINE HCL 20 MG/ML IJ SOLN: 10.0000 mg | INTRAMUSCULAR | Status: DC | PRN

## 2019-02-13 MED ORDER — HEPARIN (PORCINE) IN NACL 1000-0.9 UT/500ML-% IV SOLN
INTRAVENOUS | Status: DC | PRN
  Administered 2019-02-13 (×2): 500 mL

## 2019-02-13 MED ORDER — LIDOCAINE HCL (PF) 1 % IJ SOLN
INTRAMUSCULAR | Status: DC | PRN
  Administered 2019-02-13: 15 mL

## 2019-02-13 MED ORDER — SODIUM CHLORIDE 0.9 % IV SOLN
INTRAVENOUS | Status: AC
  Administered 2019-02-13: 18:00:00 via INTRAVENOUS

## 2019-02-13 MED ORDER — SODIUM CHLORIDE 0.9 % WEIGHT BASED INFUSION: 1.0000 mL/kg/h | INTRAVENOUS | Status: DC

## 2019-02-13 MED ORDER — GLUCOSE 4 G PO CHEW
CHEWABLE_TABLET | ORAL | Status: AC
  Filled 2019-02-13: qty 2

## 2019-02-13 MED ORDER — CARVEDILOL 6.25 MG PO TABS: 6.2500 mg | ORAL_TABLET | Freq: Two times a day (BID) | ORAL | 0 refills | Status: DC

## 2019-02-13 MED ORDER — HEPARIN (PORCINE) IN NACL 1000-0.9 UT/500ML-% IV SOLN
INTRAVENOUS | Status: AC
  Filled 2019-02-13: qty 1000

## 2019-02-13 MED ORDER — LIDOCAINE HCL (PF) 1 % IJ SOLN
INTRAMUSCULAR | Status: AC
  Filled 2019-02-13: qty 30

## 2019-02-13 MED ORDER — DEXTROSE 50 % IV SOLN
INTRAVENOUS | Status: AC
  Administered 2019-02-13: 50 mL
  Filled 2019-02-13: qty 50

## 2019-02-13 MED ORDER — ASPIRIN 81 MG PO CHEW
81.0000 mg | CHEWABLE_TABLET | ORAL | Status: AC
  Administered 2019-02-13: 81 mg via ORAL

## 2019-02-13 MED ORDER — MIDAZOLAM HCL 2 MG/2ML IJ SOLN
INTRAMUSCULAR | Status: DC | PRN
  Administered 2019-02-13: 1 mg via INTRAVENOUS

## 2019-02-13 MED ORDER — SODIUM CHLORIDE 0.9% FLUSH: 3.0000 mL | Freq: Two times a day (BID) | INTRAVENOUS | Status: DC

## 2019-02-13 MED ORDER — MIDAZOLAM HCL 2 MG/2ML IJ SOLN
INTRAMUSCULAR | Status: AC
  Filled 2019-02-13: qty 2

## 2019-02-13 MED ORDER — SODIUM CHLORIDE 0.9% FLUSH: 3.0000 mL | INTRAVENOUS | Status: DC | PRN

## 2019-02-13 MED ORDER — FENTANYL CITRATE (PF) 100 MCG/2ML IJ SOLN
INTRAMUSCULAR | Status: AC
  Filled 2019-02-13: qty 2

## 2019-02-13 MED ORDER — SODIUM CHLORIDE 0.9 % WEIGHT BASED INFUSION
1.0000 mL/kg/h | INTRAVENOUS | Status: DC
  Administered 2019-02-13 (×2): 1 mL/kg/h via INTRAVENOUS

## 2019-02-13 MED ORDER — SODIUM CHLORIDE 0.9 % WEIGHT BASED INFUSION: 3.0000 mL/kg/h | INTRAVENOUS | Status: DC

## 2019-02-13 MED ORDER — LABETALOL HCL 5 MG/ML IV SOLN
INTRAVENOUS | Status: AC
  Filled 2019-02-13: qty 4

## 2019-02-13 MED ORDER — NITROGLYCERIN 1 MG/10 ML FOR IR/CATH LAB
INTRA_ARTERIAL | Status: AC
  Filled 2019-02-13: qty 10

## 2019-02-13 MED ORDER — SODIUM CHLORIDE 0.9 % IV SOLN: 250.0000 mL | INTRAVENOUS | Status: DC | PRN

## 2019-02-13 MED ORDER — NITROGLYCERIN 1 MG/10 ML FOR IR/CATH LAB
INTRA_ARTERIAL | Status: DC | PRN
  Administered 2019-02-13: 200 ug via INTRACORONARY

## 2019-02-13 MED ORDER — IOHEXOL 350 MG/ML SOLN
INTRAVENOUS | Status: DC | PRN
  Administered 2019-02-13: 70 mL

## 2019-02-13 MED ORDER — LABETALOL HCL 5 MG/ML IV SOLN
10.0000 mg | INTRAVENOUS | Status: DC | PRN
  Administered 2019-02-13: 10 mg via INTRAVENOUS

## 2019-02-13 MED ORDER — FENTANYL CITRATE (PF) 100 MCG/2ML IJ SOLN
INTRAMUSCULAR | Status: DC | PRN
  Administered 2019-02-13: 25 ug via INTRAVENOUS

## 2019-02-13 MED ORDER — ENOXAPARIN SODIUM 40 MG/0.4ML ~~LOC~~ SOLN: 40.0000 mg | SUBCUTANEOUS | Status: DC

## 2019-02-13 MED ORDER — CARVEDILOL 6.25 MG PO TABS
6.2500 mg | ORAL_TABLET | Freq: Two times a day (BID) | ORAL | Status: DC
  Administered 2019-02-13: 6.25 mg via ORAL
  Filled 2019-02-13: qty 1

## 2019-02-13 MED ORDER — ASPIRIN 81 MG PO CHEW: 81.0000 mg | CHEWABLE_TABLET | ORAL | Status: DC

## 2019-02-13 SURGICAL SUPPLY — 10 items
CATH DXT MULTI JL4 JR4 ANG PIG (CATHETERS) ×2 IMPLANT
CLOSURE MYNX CONTROL 5F (Vascular Products) ×2 IMPLANT
KIT HEART LEFT (KITS) ×2 IMPLANT
KIT MICROPUNCTURE NIT STIFF (SHEATH) ×2 IMPLANT
PACK CARDIAC CATHETERIZATION (CUSTOM PROCEDURE TRAY) ×2 IMPLANT
SHEATH PINNACLE 5F 10CM (SHEATH) ×2 IMPLANT
SHEATH PROBE COVER 6X72 (BAG) ×2 IMPLANT
TRANSDUCER W/STOPCOCK (MISCELLANEOUS) ×2 IMPLANT
TUBING CIL FLEX 10 FLL-RA (TUBING) ×2 IMPLANT
WIRE EMERALD 3MM-J .035X150CM (WIRE) ×2 IMPLANT

## 2019-02-13 NOTE — Discharge Instructions (Signed)
Angina ° °Angina is very bad discomfort or pain in the chest, neck, arm, jaw, or back. The discomfort is caused by a lack of blood in the middle layer of the heart wall (myocardium). °What are the causes? °This condition is caused by a buildup of fat and cholesterol (plaque) in your arteries (atherosclerosis). This buildup narrows the arteries and makes it hard for blood to flow. °What increases the risk? °You are more likely to develop this condition if: °· You have high levels of cholesterol in your blood. °· You have high blood pressure (hypertension). °· You have diabetes. °· You have a family history of heart disease. °· You are not active, or you do not exercise enough. °· You feel sad (depressed). °· You have been treated with high energy rays (radiation) on the left side of your chest. °Other risk factors are: °· Using tobacco. °· Being very overweight (obese). °· Eating a diet high in unhealthy fats (saturated fats). °· Having stress, or being exposed to things that cause stress. °· Using drugs, such as cocaine. °Women have a greater risk for angina if: °· They are older than 55. °· They have stopped having their period (are in postmenopause). °What are the signs or symptoms? °Common symptoms of this condition in both men and women may include: °· Chest pain, which may: °? Feel like a crushing or squeezing in the chest. °? Feel like a tightness, pressure, fullness, or heaviness in the chest. °? Last for more than a few minutes at a time. °? Stop and come back (recur) after a few minutes. °· Pain in the neck, arm, jaw, or back. °· Heartburn or upset stomach (indigestion) for no reason. °· Being short of breath. °· Feeling sick to your stomach (nauseous). °· Sudden cold sweats. °Women and people with diabetes may have other symptoms that are not usual, such as feeling: °· Tired (fatigue). °· Worried or nervous (anxious) for no reason. °· Weak for no reason. °· Dizzy or passing out (fainting). °How is this  treated? °This condition may be treated with: °· Medicines. These are given to: °? Prevent blood clots. °? Prevent heart attack. °? Relax blood vessels and improve blood flow to the heart (nitrates). °? Reduce blood pressure. °? Improve the pumping action of the heart. °? Reduce fat and cholesterol in the blood. °· A procedure to widen a narrowed or blocked artery in the heart (angioplasty). °· Surgery to allow blood to go around a blocked artery (coronary artery bypass surgery). °Follow these instructions at home: °Medicines °· Take over-the-counter and prescription medicines only as told by your doctor. °· Do not take these medicines unless your doctor says that you can: °? NSAIDs. These include: °§ Ibuprofen. °§ Naproxen. °? Vitamin supplements that have vitamin A, vitamin E, or both. °? Hormone therapy that contains estrogen with or without progestin. °Eating and drinking ° °· Eat a heart-healthy diet that includes: °? Lots of fresh fruits and vegetables. °? Whole grains. °? Low-fat (lean) protein. °? Low-fat dairy products. °· Follow instructions from your doctor about what you cannot eat or drink. °Activity °· Follow an exercise program that your doctor tells you. °· Talk with your doctor about joining a program to help improve the health of your heart (cardiac rehab). °· When you feel tired, take a break. Plan breaks if you know you are going to feel tired. °Lifestyle ° °· Do not use any products that contain nicotine or tobacco. This includes cigarettes, e-cigarettes, and   chewing tobacco. If you need help quitting, ask your doctor. °· If your doctor says you can drink alcohol: °? Limit how much you use to: °§ 0-1 drink a day for women who are not pregnant. °§ 0-2 drinks a day for men. °? Be aware of how much alcohol is in your drink. In the [place] one drink equals: °§ One 12 oz bottle of beer (355 mL). °§ One 5 oz glass of wine (148 mL). °§ One 1½ oz glass of hard liquor (44 mL). °General instructions °· Stay  at a healthy weight. If your doctor tells you to do so, work with him or her to lose weight. °· Learn to deal with stress. If you need help, ask your doctor. °· Keep your vaccines up to date. Get a flu shot every year. °· Talk with your doctor if you feel sad. Take a screening test to see if you are at risk for depression. °· Work with your doctor to manage any other health problems that you have. These may include diabetes or high blood pressure. °· Keep all follow-up visits as told by your doctor. This is important. °Get help right away if: °· You have pain in your chest, neck, arm, jaw, or back, and the pain: °? Lasts more than a few minutes. °? Comes back. °? Does not get better after you take medicine under your tongue (sublingual nitroglycerin). °? Keeps getting worse. °? Comes more often. °· You have any of these problems for no reason: °? Sweating a lot. °? Heartburn or upset stomach. °? Shortness of breath. °? Trouble breathing. °? Feeling sick to your stomach. °? Throwing up (vomiting). °? Feeling more tired than normal. °? Feeling nervous or worrying more than normal. °? Weakness. °· You are suddenly dizzy or light-headed. °· You pass out. °These symptoms may be an emergency. Do not wait to see if the symptoms will go away. Get medical help right away. Call your local emergency services (911 in the [place] Do not drive yourself to the hospital. °Summary °· Angina is very bad discomfort or pain in the chest, neck, arm, neck, or back. °· Symptoms include chest pain, heartburn or upset stomach for no reason, and shortness of breath. °· Women or people with diabetes may have symptoms that are not usual, such as feeling nervous or worried for no reason, weak for no reason, or tired. °· Take all medicines only as told by your doctor. °· You should eat a heart-healthy diet and follow an exercise program. °This information is not intended to replace advice given to you by your health care provider. Make sure you  discuss any questions you have with your health care provider. °Document Released: 09/09/2007 Document Revised: 11/08/2017 Document Reviewed: 11/08/2017 °Elsevier Patient Education © 2020 [employer]. ° °

## 2019-02-13 NOTE — Progress Notes (Addendum)
Progress Note  Patient Name: [name] Date of Encounter: 02/13/2019  Primary Cardiologist: Jenne Campus, MD   Subjective   No chest pain or SOB, a little nervous about cath, but ok  Inpatient Medications    Scheduled Meds:  aspirin  81 mg Oral Daily   atorvastatin  10 mg Oral QHS   busPIRone  15 mg Oral TID   carvedilol  3.125 mg Oral BID WC   diazepam  10 mg Oral QID   enoxaparin (LOVENOX) injection  40 mg Subcutaneous Q24H   fluticasone furoate-vilanterol  1 puff Inhalation Daily   insulin aspart  0-9 Units Subcutaneous Q4H   lisinopril  10 mg Oral Daily   montelukast  10 mg Oral Daily   pantoprazole  40 mg Oral Daily   polyethylene glycol  17 g Oral Daily   QUEtiapine  100 mg Oral QHS   rOPINIRole  1 mg Oral QHS   sodium chloride flush  3 mL Intravenous Q12H   Continuous Infusions:  sodium chloride     sodium chloride 1 mL/kg/hr (02/13/19 0555)   dextrose 5 % and 0.45% NaCl 30 mL/hr at 02/12/19 1158   PRN Meds: sodium chloride, albuterol, fluticasone, ondansetron (ZOFRAN) IV, sodium chloride flush   Vital Signs    Vitals:   02/12/19 0826 02/12/19 1322 02/12/19 2023 02/13/19 0453  BP:  (!) 119/91 (!) 142/111 115/81  Pulse: 88 97 95 100  Resp: 16  20 16   Temp:  98.1 F (36.7 C) 97.9 F (36.6 C) 98.4 F (36.9 C)  TempSrc:  Oral Oral Oral  SpO2: 97% 98% 97% 93%  Weight:    92.1 kg  Height:       No intake or output data in the 24 hours ending 02/13/19 0816 Last 3 Weights 02/13/2019 02/10/2019 02/09/2019  Weight (lbs) 203 lb 1.6 oz 208 lb 6.4 oz 208 lb 4.8 oz  Weight (kg) 92.126 kg 94.53 kg 94.484 kg      Telemetry    SR - Personally Reviewed  ECG    None today - Personally Reviewed  Physical Exam   GEN: No acute distress.   Neck: No JVD Cardiac: RRR, no murmurs, rubs, or gallops.  Respiratory: decreased BS bases w/ a few rales bilaterally. GI: Soft, nontender, non-distended  MS: No edema; No deformity. Neuro:   Nonfocal  Psych: Normal affect   Labs    High Sensitivity Troponin:   Recent Labs  Lab 02/08/19 2347 02/09/19 0107 02/09/19 0520 02/11/19 0450 02/11/19 0613  TROPONINIHS <2.00 <2.00 <2 <2.00 <2      Chemistry Recent Labs  Lab 02/08/19 2357 02/09/19 0520 02/13/19 0550  NA 142 141 139  K 4.0 3.8 4.4  CL 109 108 105  CO2  --  25 25  GLUCOSE 64* 83 107*  BUN 9 11 12   CREATININE 0.60 0.68 0.64  CALCIUM  --  8.0* 8.9  PROT  --  6.3*  --   ALBUMIN  --  3.5  --   AST  --  26  --   ALT  --  38  --   ALKPHOS  --  59  --   BILITOT  --  0.5  --   GFRNONAA  --  >60 >60  GFRAA  --  >60 >60  ANIONGAP  --  8 9     Hematology Recent Labs  Lab 02/08/19 2347 02/08/19 2357 02/09/19 0520 02/13/19 0550  WBC 7.4  --  6.9 7.8  RBC 4.10  --  3.99 4.47  HGB 12.4 13.9 12.1 13.4  HCT 40.1 41.0 38.9 43.4  MCV 97.8  --  97.5 97.1  MCH 30.2  --  30.3 30.0  MCHC 30.9  --  31.1 30.9  RDW 14.6  --  14.6 14.5  PLT 381  --  365 394    BNP Recent Labs  Lab 02/08/19 2345  BNP 18.8    Lab Results  Component Value Date   CHOL 173 11/27/2012   HDL 49 11/27/2012   LDLCALC 98 11/27/2012   TRIG 128 11/27/2012   CHOLHDL 3.5 11/27/2012   Lab Results  Component Value Date   TSH 0.507 **Test methodology is 3rd generation TSH** 02/24/2009   Lab Results  Component Value Date   HGBA1C 6.4 (H) 02/08/2018     Radiology    Ct Angio Chest Pe W And/or Wo Contrast  Result Date: 02/09/2019 CLINICAL DATA:  Chest pain EXAM: CT ANGIOGRAPHY CHEST WITH CONTRAST TECHNIQUE: Multidetector CT imaging of the chest was performed using the standard protocol during bolus administration of intravenous contrast. Multiplanar CT image reconstructions and MIPs were obtained to evaluate the vascular anatomy. CONTRAST:  151mL OMNIPAQUE IOHEXOL 350 MG/ML SOLN COMPARISON:  02/23/2009 FINDINGS: Cardiovascular: No filling defects in the pulmonary arteries to suggest pulmonary emboli. Heart is mildly enlarged.  Aorta is normal caliber. No dissection. Mediastinum/Nodes: No mediastinal, hilar, or axillary adenopathy. Trachea and esophagus are unremarkable. Thyroid unremarkable. Lungs/Pleura: Bibasilar atelectasis.  No effusions. Upper Abdomen: Imaging into the upper abdomen shows no acute findings. Musculoskeletal: Chest wall soft tissues are unremarkable. No acute bony abnormality. Review of the MIP images confirms the above findings. IMPRESSION: No evidence of pulmonary embolus. Mild cardiomegaly. Bibasilar atelectasis. Electronically Signed   By: Rolm Baptise M.D.   On: 02/09/2019 01:17   Nm Myocar Multi W/spect W/wall Motion / Ef  Result Date: 02/10/2019 CLINICAL DATA:  Chest discomfort.  ACS.  Hypertension and diabetes. EXAM: MYOCARDIAL IMAGING WITH SPECT (REST AND PHARMACOLOGIC-STRESS) GATED LEFT VENTRICULAR WALL MOTION STUDY LEFT VENTRICULAR EJECTION FRACTION TECHNIQUE: Standard myocardial SPECT imaging was performed after resting intravenous injection of 10 mCi Tc-48m tetrofosmin. Subsequently, intravenous infusion of Lexiscan was performed under the supervision of the Cardiology staff. At peak effect of the drug, 30 mCi Tc-38m tetrofosmin was injected intravenously and standard myocardial SPECT imaging was performed. Quantitative gated imaging was also performed to evaluate left ventricular wall motion, and estimate left ventricular ejection fraction. COMPARISON:  CT chest 02/09/2019 FINDINGS: Perfusion: Moderate-sized and moderate severity reduction in activity in the inferior wall on stress images compared to rest images. Quantitatively this measures up to a 13% reduction, and raises concern for inducible ischemia. Wall Motion: Mild septal wall poor wall thickening, and mild hypokinesis in the lateral wall. Left Ventricular Ejection Fraction: 41 % End diastolic volume 99991111 ml (mildly elevated) End systolic volume 65 ml (moderately elevated) IMPRESSION: 1. Moderate sized, moderate severity reversible activity in  the inferior wall suspicious for inducible ischemia. 2. Mild lateral hypokinesis and mild septal poor wall thickening. 3. Left ventricular ejection fraction 41% 4. Non invasive risk stratification*: High (on the basis of moderate-sized defect and mild to moderate left ventricular dilatation). *2012 Appropriate Use Criteria for Coronary Revascularization Focused Update: J Am Coll Cardiol. [id number]. http://[email or web address]?[email or web address] Electronically Signed   By: Van Clines M.D.   On: 02/10/2019 16:19   Dg Chest Portable 1 View  Result Date: 02/08/2019 CLINICAL DATA:  Chest pain, shortness of breath EXAM: PORTABLE CHEST  1 VIEW COMPARISON:  02/20/2018 FINDINGS: Heart is borderline enlarged. Mild vascular congestion. Interstitial prominence could reflect early interstitial edema. Bibasilar atelectasis. No effusions or acute bony abnormality. IMPRESSION: Borderline cardiomegaly with vascular congestion and interstitial prominence, question early interstitial edema. Bibasilar atelectasis. Electronically Signed   By: Rolm Baptise M.D.   On: 02/08/2019 23:19   Cardiac Studies   ECHO: 02/09/2019  1. Left ventricular ejection fraction, by visual estimation, is 45 to 50%. The left ventricle has mildly decreased function. There is mildly increased left ventricular hypertrophy.  2. Left ventricular diastolic parameters are consistent with Grade I diastolic dysfunction (impaired relaxation).  3. The left ventricle demonstrates global hypokinesis.  4. Global right ventricle has normal systolic function.The right ventricular size is normal. No increase in right ventricular wall thickness.  5. Left atrial size was normal.  6. Right atrial size was normal.  7. The mitral valve is normal in structure. No evidence of mitral valve regurgitation. No evidence of mitral stenosis.  8. The tricuspid valve is normal in structure. Tricuspid valve regurgitation is not demonstrated.  9.  The aortic valve is tricuspid. Aortic valve regurgitation is not visualized. No evidence of aortic valve sclerosis or stenosis. 10. The inferior vena cava is normal in size with greater than 50% respiratory variability, suggesting right atrial pressure of 3 mmHg. 11. The interatrial septum was not well visualized. Per report, patient has PFO but this could not be visualized by TTE (no bubble study done). 12. TR signal is inadequate for assessing pulmonary artery systolic pressure.  Patient Profile     53 y.o. female w/ hx PFO, DM2, HTN, OSA using CPAP, palpitations w/ PVCs/PACs on montior, was admitted 11/05 w/ atyp CP, ez neg but EF 45-50% by echo and MV abnl>>cath 11/09.   Assessment & Plan    1. Chest pain - EF decreased on echo and MV abnl>>cath today - on ASA, BB, statin - will increase Coreg to 6.35 mg bid and see how tolerated  2. Hyperlipidemia - on Lipitor 10 mg, ck profile  Otherwise, per IM Active Problems:   Essential hypertension   Type 2 diabetes mellitus without complication, without long-term current use of insulin (HCC)   Chest pain   Hypoglycemia   Syncope     For questions or updates, please contact [facility] Please consult [email or web address] for contact info under        Signed, Rosaria Ferries, PA-C  02/13/2019, 8:16 AM   As above, patient seen and examined.  She has not had recurrent chest pain or dyspnea.  Echocardiogram shows mild reduction in LV function and nuclear study suggestive of inferior ischemia.  Continue present medications.  For cardiac catheterization today.  The risks and benefits including myocardial infarction, CVA and death discussed and she agrees to proceed.  Kirk Ruths, MD

## 2019-02-13 NOTE — Progress Notes (Signed)
Patient discharged home.  IV removed - WNL.  Reviewed AVS and medications - follow ups already scheduled.  Instructed patient to leave dressing to femoral site for 24 hours and to apply pressure if bleeding occurs and seek medical care for recurrent CP.  Verbalizes understanding.  No questions at this time, assisted off unit in NAD by NT

## 2019-02-13 NOTE — Care Management Important Message (Signed)
Important Message  Patient Details IM Letter given to Dessa Phi to present to the Patient Name: [name] MRN: [id number] Date of Birth: 12/06/1965   Medicare Important Message Given:  Yes     Kerin Salen 02/13/2019, 1:18 PM

## 2019-02-13 NOTE — Discharge Summary (Signed)
Physician Discharge Summary  Patient ID: [name] MRN: [id number] DOB/AGE: 08/17/1965 53 y.o.  Admit date: 02/08/2019 Discharge date: 02/13/2019  Admission Diagnoses:  Discharge Diagnoses:  Active Problems:   Essential hypertension   Type 2 diabetes mellitus without complication, without long-term current use of insulin (HCC)   Chest pain   Hypoglycemia   Syncope   Unstable angina (HCC)   Abnormal stress test   Discharged Condition: stable  Hospital Course: Patient is a 53 year old African-American female with past medical history significant for morbid obesity, hypertension and diabetes mellitus.  Patient was admitted with chest pain.  Troponins have been negative.  Echocardiogram revealed EF of 45 to 50%, left ventricle global hypokinesis and grade 1 diastolic dysfunction.  EKG is noted.  Patient continues to report vague chest pain.  Cardiac stress test was concerning for inducible ischemia.  Will await cardiology team.  Further management will depend on hospital course.  Cardiology consulted results are abnormal cardiac stress test.  Patient underwent cardiac catheterization that did not reveal significant coronary artery disease.  Patient has been cleared for discharge by the cardiology team.    Chest pain with abnormal cardiac stress test: -Cardiology input is appreciated.  For cardiac cath done Monday, 02/13/2019 -No chest pain reported. -Repeat troponins came back negative.. -Repeat EKG for significant chest pain. -Echocardiogram revealed EF of 45 to 50% (down from 55 to 60%), and grade 1 diastolic dysfunction. -Positive cardiac stress test. -Further management will depend on hospital course.  Diabetes mellitus: -Continue to optimize. -HbA1c was 6.4%. -Hypoglycemia was documented on presentation, and glipizide and Ozempic are on hold. -Continue to monitor closely.  Currently, patient is on sliding scale insulin coverage.  Hypertension: -This is reasonably  controlled. -Goal blood pressures less than 130/80 mmHg.  Anxiety/asthma:  -Chronic and stable.   Consults: cardiology  Significant Diagnostic Studies:  -Cardiac catheterization.  Discharge Exam: Blood pressure (!) 141/106, pulse 92, temperature 98.1 F (36.7 C), temperature source Oral, resp. rate 20, height 5' (1.524 m), weight 92.1 kg, SpO2 99 %.   Disposition: Discharge disposition: 01-Home or Self Care  Discharge Instructions    Diet - low sodium heart healthy   Complete by: As directed    Diet - low sodium heart healthy   Complete by: As directed    Increase activity slowly   Complete by: As directed    Increase activity slowly   Complete by: As directed      Allergies as of 02/13/2019      Reactions   Banana Shortness Of Breath, Swelling   Latex Shortness Of Breath   Onion Shortness Of Breath, Swelling   Penicillins Anaphylaxis, Swelling, Other (See Comments)   Has patient had a PCN reaction causing immediate rash, facial/tongue/throat swelling, SOB or lightheadedness with hypotension: Yes Has patient had a PCN reaction causing severe rash involving mucus membranes or skin necrosis: No Has patient had a PCN reaction that required hospitalization: Yes - inpatient Has patient had a PCN reaction occurring within the last 10 years: No If all of the above answers are "NO", then may proceed with Cephalosporin use.   Tylenol [acetaminophen] Other (See Comments)   Throat swelling    Codeine Swelling   Hydrocodone Hives, Swelling   Insect Wax    Any Insect that bites      Medication List    STOP taking these medications   docusate sodium 100 MG capsule Commonly known as: Colace     TAKE these medications   albuterol  108 (90 Base) MCG/ACT inhaler Commonly known as: VENTOLIN HFA Inhale 2 puffs into the lungs every 6 (six) hours as needed for wheezing or shortness of breath.   aspirin 81 MG chewable tablet Chew 81 mg by mouth daily.   atorvastatin 10 MG  tablet Commonly known as: LIPITOR Take 10 mg by mouth at bedtime.   Breo Ellipta 200-25 MCG/INH Aepb Generic drug: fluticasone furoate-vilanterol Inhale 1 puff into the lungs daily.   busPIRone 15 MG tablet Commonly known as: BUSPAR Take 15 mg by mouth 3 (three) times daily.   carvedilol 6.25 MG tablet Commonly known as: COREG Take 1 tablet (6.25 mg total) by mouth 2 (two) times daily with a meal. Start taking on: February 14, 2019 What changed:   medication strength  how much to take   diazepam 10 MG tablet Commonly known as: VALIUM Take 10 mg by mouth 4 (four) times daily.   EPINEPHrine 0.3 mg/0.3 mL Devi Commonly known as: EPI-PEN Inject 0.3 mg into the muscle once.   fluticasone 50 MCG/ACT nasal spray Commonly known as: FLONASE Place 2 sprays into both nostrils daily as needed for allergies.   glipiZIDE 10 MG 24 hr tablet Commonly known as: GLUCOTROL XL Take 1 tablet by mouth daily.   lisinopril 10 MG tablet Commonly known as: ZESTRIL Take 10 mg by mouth daily.   methocarbamol 500 MG tablet Commonly known as: Robaxin Take 1 tablet (500 mg total) by mouth every 6 (six) hours as needed for muscle spasms.   montelukast 10 MG tablet Commonly known as: SINGULAIR Take 10 mg by mouth daily.   omeprazole 20 MG capsule Commonly known as: PRILOSEC Take 20 mg by mouth daily.   Ozempic (0.25 or 0.5 MG/DOSE) 2 MG/1.5ML Sopn Generic drug: Semaglutide(0.25 or 0.5MG /DOS) 1 mg by Subconjunctival route See admin instructions. On Tuesdays   polyethylene glycol 17 g packet Commonly known as: MIRALAX / GLYCOLAX Take 17 g by mouth daily.   QUEtiapine 100 MG tablet Commonly known as: SEROQUEL Take 100 mg by mouth at bedtime.   rOPINIRole 1 MG tablet Commonly known as: REQUIP Take 1 mg by mouth at bedtime.        SignedBonnell Public 02/13/2019, 6:33 PM

## 2019-02-13 NOTE — Interval H&P Note (Signed)
History and Physical Interval Note:  02/13/2019 1:20 PM  [name]  has presented today for cardiac catheterization, with the diagnosis of unstable angina.  The various methods of treatment have been discussed with the patient and family. After consideration of risks, benefits and other options for treatment, the patient has consented to  Procedure(s): LEFT HEART CATH AND CORONARY ANGIOGRAPHY (N/A) as a surgical intervention.  The patient's history has been reviewed, patient examined, no change in status, stable for surgery.  I have reviewed the patient's chart and labs.  Questions were answered to the patient's satisfaction.    Cath Lab Visit (complete for each Cath Lab visit)  Clinical Evaluation Leading to the Procedure:   ACS: Yes.    Non-ACS:  N/A  [name]

## 2019-02-13 NOTE — Progress Notes (Addendum)
Hypoglycemic Event  CBG: 57  Treatment: Dextrose 25 mg  Symptoms: HA, tingling fingers  Follow-up CBG: Time:1221 CBG Result:125  Possible Reasons for Event: NPO  Comments/MD notified:[place]    [name]

## 2019-02-13 NOTE — H&P (View-Only) (Signed)
Progress Note  Patient Name: [name] Date of Encounter: 02/13/2019  Primary Cardiologist: Jenne Campus, MD   Subjective   No chest pain or SOB, a little nervous about cath, but ok  Inpatient Medications    Scheduled Meds:  aspirin  81 mg Oral Daily   atorvastatin  10 mg Oral QHS   busPIRone  15 mg Oral TID   carvedilol  3.125 mg Oral BID WC   diazepam  10 mg Oral QID   enoxaparin (LOVENOX) injection  40 mg Subcutaneous Q24H   fluticasone furoate-vilanterol  1 puff Inhalation Daily   insulin aspart  0-9 Units Subcutaneous Q4H   lisinopril  10 mg Oral Daily   montelukast  10 mg Oral Daily   pantoprazole  40 mg Oral Daily   polyethylene glycol  17 g Oral Daily   QUEtiapine  100 mg Oral QHS   rOPINIRole  1 mg Oral QHS   sodium chloride flush  3 mL Intravenous Q12H   Continuous Infusions:  sodium chloride     sodium chloride 1 mL/kg/hr (02/13/19 0555)   dextrose 5 % and 0.45% NaCl 30 mL/hr at 02/12/19 1158   PRN Meds: sodium chloride, albuterol, fluticasone, ondansetron (ZOFRAN) IV, sodium chloride flush   Vital Signs    Vitals:   02/12/19 0826 02/12/19 1322 02/12/19 2023 02/13/19 0453  BP:  (!) 119/91 (!) 142/111 115/81  Pulse: 88 97 95 100  Resp: 16  20 16   Temp:  98.1 F (36.7 C) 97.9 F (36.6 C) 98.4 F (36.9 C)  TempSrc:  Oral Oral Oral  SpO2: 97% 98% 97% 93%  Weight:    92.1 kg  Height:       No intake or output data in the 24 hours ending 02/13/19 0816 Last 3 Weights 02/13/2019 02/10/2019 02/09/2019  Weight (lbs) 203 lb 1.6 oz 208 lb 6.4 oz 208 lb 4.8 oz  Weight (kg) 92.126 kg 94.53 kg 94.484 kg      Telemetry    SR - Personally Reviewed  ECG    None today - Personally Reviewed  Physical Exam   GEN: No acute distress.   Neck: No JVD Cardiac: RRR, no murmurs, rubs, or gallops.  Respiratory: decreased BS bases w/ a few rales bilaterally. GI: Soft, nontender, non-distended  MS: No edema; No deformity. Neuro:   Nonfocal  Psych: Normal affect   Labs    High Sensitivity Troponin:   Recent Labs  Lab 02/08/19 2347 02/09/19 0107 02/09/19 0520 02/11/19 0450 02/11/19 0613  TROPONINIHS <2.00 <2.00 <2 <2.00 <2      Chemistry Recent Labs  Lab 02/08/19 2357 02/09/19 0520 02/13/19 0550  NA 142 141 139  K 4.0 3.8 4.4  CL 109 108 105  CO2  --  25 25  GLUCOSE 64* 83 107*  BUN 9 11 12   CREATININE 0.60 0.68 0.64  CALCIUM  --  8.0* 8.9  PROT  --  6.3*  --   ALBUMIN  --  3.5  --   AST  --  26  --   ALT  --  38  --   ALKPHOS  --  59  --   BILITOT  --  0.5  --   GFRNONAA  --  >60 >60  GFRAA  --  >60 >60  ANIONGAP  --  8 9     Hematology Recent Labs  Lab 02/08/19 2347 02/08/19 2357 02/09/19 0520 02/13/19 0550  WBC 7.4  --  6.9 7.8  RBC 4.10  --  3.99 4.47  HGB 12.4 13.9 12.1 13.4  HCT 40.1 41.0 38.9 43.4  MCV 97.8  --  97.5 97.1  MCH 30.2  --  30.3 30.0  MCHC 30.9  --  31.1 30.9  RDW 14.6  --  14.6 14.5  PLT 381  --  365 394    BNP Recent Labs  Lab 02/08/19 2345  BNP 18.8    Lab Results  Component Value Date   CHOL 173 11/27/2012   HDL 49 11/27/2012   LDLCALC 98 11/27/2012   TRIG 128 11/27/2012   CHOLHDL 3.5 11/27/2012   Lab Results  Component Value Date   TSH 0.507 **Test methodology is 3rd generation TSH** 02/24/2009   Lab Results  Component Value Date   HGBA1C 6.4 (H) 02/08/2018     Radiology    Ct Angio Chest Pe W And/or Wo Contrast  Result Date: 02/09/2019 CLINICAL DATA:  Chest pain EXAM: CT ANGIOGRAPHY CHEST WITH CONTRAST TECHNIQUE: Multidetector CT imaging of the chest was performed using the standard protocol during bolus administration of intravenous contrast. Multiplanar CT image reconstructions and MIPs were obtained to evaluate the vascular anatomy. CONTRAST:  1103mL OMNIPAQUE IOHEXOL 350 MG/ML SOLN COMPARISON:  02/23/2009 FINDINGS: Cardiovascular: No filling defects in the pulmonary arteries to suggest pulmonary emboli. Heart is mildly enlarged.  Aorta is normal caliber. No dissection. Mediastinum/Nodes: No mediastinal, hilar, or axillary adenopathy. Trachea and esophagus are unremarkable. Thyroid unremarkable. Lungs/Pleura: Bibasilar atelectasis.  No effusions. Upper Abdomen: Imaging into the upper abdomen shows no acute findings. Musculoskeletal: Chest wall soft tissues are unremarkable. No acute bony abnormality. Review of the MIP images confirms the above findings. IMPRESSION: No evidence of pulmonary embolus. Mild cardiomegaly. Bibasilar atelectasis. Electronically Signed   By: Rolm Baptise M.D.   On: 02/09/2019 01:17   Nm Myocar Multi W/spect W/wall Motion / Ef  Result Date: 02/10/2019 CLINICAL DATA:  Chest discomfort.  ACS.  Hypertension and diabetes. EXAM: MYOCARDIAL IMAGING WITH SPECT (REST AND PHARMACOLOGIC-STRESS) GATED LEFT VENTRICULAR WALL MOTION STUDY LEFT VENTRICULAR EJECTION FRACTION TECHNIQUE: Standard myocardial SPECT imaging was performed after resting intravenous injection of 10 mCi Tc-56m tetrofosmin. Subsequently, intravenous infusion of Lexiscan was performed under the supervision of the Cardiology staff. At peak effect of the drug, 30 mCi Tc-24m tetrofosmin was injected intravenously and standard myocardial SPECT imaging was performed. Quantitative gated imaging was also performed to evaluate left ventricular wall motion, and estimate left ventricular ejection fraction. COMPARISON:  CT chest 02/09/2019 FINDINGS: Perfusion: Moderate-sized and moderate severity reduction in activity in the inferior wall on stress images compared to rest images. Quantitatively this measures up to a 13% reduction, and raises concern for inducible ischemia. Wall Motion: Mild septal wall poor wall thickening, and mild hypokinesis in the lateral wall. Left Ventricular Ejection Fraction: 41 % End diastolic volume 99991111 ml (mildly elevated) End systolic volume 65 ml (moderately elevated) IMPRESSION: 1. Moderate sized, moderate severity reversible activity in  the inferior wall suspicious for inducible ischemia. 2. Mild lateral hypokinesis and mild septal poor wall thickening. 3. Left ventricular ejection fraction 41% 4. Non invasive risk stratification*: High (on the basis of moderate-sized defect and mild to moderate left ventricular dilatation). *2012 Appropriate Use Criteria for Coronary Revascularization Focused Update: J Am Coll Cardiol. [id number]. http://content.[email or web address].aspx?articleid=[email or web address] Electronically Signed   By: Van Clines M.D.   On: 02/10/2019 16:19   Dg Chest Portable 1 View  Result Date: 02/08/2019 CLINICAL DATA:  Chest pain, shortness of breath EXAM: PORTABLE CHEST  1 VIEW COMPARISON:  02/20/2018 FINDINGS: Heart is borderline enlarged. Mild vascular congestion. Interstitial prominence could reflect early interstitial edema. Bibasilar atelectasis. No effusions or acute bony abnormality. IMPRESSION: Borderline cardiomegaly with vascular congestion and interstitial prominence, question early interstitial edema. Bibasilar atelectasis. Electronically Signed   By: Rolm Baptise M.D.   On: 02/08/2019 23:19   Cardiac Studies   ECHO: 02/09/2019  1. Left ventricular ejection fraction, by visual estimation, is 45 to 50%. The left ventricle has mildly decreased function. There is mildly increased left ventricular hypertrophy.  2. Left ventricular diastolic parameters are consistent with Grade I diastolic dysfunction (impaired relaxation).  3. The left ventricle demonstrates global hypokinesis.  4. Global right ventricle has normal systolic function.The right ventricular size is normal. No increase in right ventricular wall thickness.  5. Left atrial size was normal.  6. Right atrial size was normal.  7. The mitral valve is normal in structure. No evidence of mitral valve regurgitation. No evidence of mitral stenosis.  8. The tricuspid valve is normal in structure. Tricuspid valve regurgitation is not demonstrated.  9.  The aortic valve is tricuspid. Aortic valve regurgitation is not visualized. No evidence of aortic valve sclerosis or stenosis. 10. The inferior vena cava is normal in size with greater than 50% respiratory variability, suggesting right atrial pressure of 3 mmHg. 11. The interatrial septum was not well visualized. Per report, patient has PFO but this could not be visualized by TTE (no bubble study done). 12. TR signal is inadequate for assessing pulmonary artery systolic pressure.  Patient Profile     53 y.o. female w/ hx PFO, DM2, HTN, OSA using CPAP, palpitations w/ PVCs/PACs on montior, was admitted 11/05 w/ atyp CP, ez neg but EF 45-50% by echo and MV abnl>>cath 11/09.   Assessment & Plan    1. Chest pain - EF decreased on echo and MV abnl>>cath today - on ASA, BB, statin - will increase Coreg to 6.35 mg bid and see how tolerated  2. Hyperlipidemia - on Lipitor 10 mg, ck profile  Otherwise, per IM Active Problems:   Essential hypertension   Type 2 diabetes mellitus without complication, without long-term current use of insulin (HCC)   Chest pain   Hypoglycemia   Syncope     For questions or updates, please contact [facility] Please consult [email or web address] for contact info under        Signed, Rosaria Ferries, PA-C  02/13/2019, 8:16 AM   As above, patient seen and examined.  She has not had recurrent chest pain or dyspnea.  Echocardiogram shows mild reduction in LV function and nuclear study suggestive of inferior ischemia.  Continue present medications.  For cardiac catheterization today.  The risks and benefits including myocardial infarction, CVA and death discussed and she agrees to proceed.  Kirk Ruths, MD

## 2019-02-14 ENCOUNTER — Encounter (HOSPITAL_COMMUNITY): Payer: Self-pay | Admitting: Internal Medicine

## 2019-02-19 ENCOUNTER — Encounter (HOSPITAL_COMMUNITY): Payer: Self-pay

## 2019-02-19 ENCOUNTER — Emergency Department (HOSPITAL_COMMUNITY): Payer: Medicare Other

## 2019-02-19 ENCOUNTER — Emergency Department (HOSPITAL_COMMUNITY)
Admission: EM | Admit: 2019-02-19 | Discharge: 2019-02-19 | Disposition: A | Discharge status: Home / Self Care | Payer: Medicare Other | Attending: Emergency Medicine | Admitting: Emergency Medicine

## 2019-02-19 DIAGNOSIS — Z79899 Other long term (current) drug therapy: Secondary | ICD-10-CM | POA: Insufficient documentation

## 2019-02-19 DIAGNOSIS — T7840XA Allergy, unspecified, initial encounter: Secondary | ICD-10-CM | POA: Insufficient documentation

## 2019-02-19 DIAGNOSIS — Z96653 Presence of artificial knee joint, bilateral: Secondary | ICD-10-CM | POA: Insufficient documentation

## 2019-02-19 DIAGNOSIS — R519 Headache, unspecified: Secondary | ICD-10-CM | POA: Insufficient documentation

## 2019-02-19 DIAGNOSIS — I10 Essential (primary) hypertension: Secondary | ICD-10-CM | POA: Insufficient documentation

## 2019-02-19 DIAGNOSIS — E119 Type 2 diabetes mellitus without complications: Secondary | ICD-10-CM | POA: Diagnosis not present

## 2019-02-19 DIAGNOSIS — Z7982 Long term (current) use of aspirin: Secondary | ICD-10-CM | POA: Diagnosis not present

## 2019-02-19 DIAGNOSIS — J45909 Unspecified asthma, uncomplicated: Secondary | ICD-10-CM | POA: Insufficient documentation

## 2019-02-19 DIAGNOSIS — R0789 Other chest pain: Secondary | ICD-10-CM | POA: Diagnosis not present

## 2019-02-19 DIAGNOSIS — Z7984 Long term (current) use of oral hypoglycemic drugs: Secondary | ICD-10-CM | POA: Insufficient documentation

## 2019-02-19 DIAGNOSIS — Z91040 Latex allergy status: Secondary | ICD-10-CM | POA: Insufficient documentation

## 2019-02-19 DIAGNOSIS — Z87891 Personal history of nicotine dependence: Secondary | ICD-10-CM | POA: Insufficient documentation

## 2019-02-19 DIAGNOSIS — R0989 Other specified symptoms and signs involving the circulatory and respiratory systems: Secondary | ICD-10-CM | POA: Diagnosis present

## 2019-02-19 LAB — CBC WITH DIFFERENTIAL/PLATELET
Abs Immature Granulocytes: 0.03 10*3/uL (ref 0.00–0.07)
Basophils Absolute: 0.1 10*3/uL (ref 0.0–0.1)
Basophils Relative: 1 %
Eosinophils Absolute: 0.2 10*3/uL (ref 0.0–0.5)
Eosinophils Relative: 1 %
HCT: 42.8 % (ref 36.0–46.0)
Hemoglobin: 13.3 g/dL (ref 12.0–15.0)
Immature Granulocytes: 0 %
Lymphocytes Relative: 40 %
Lymphs Abs: 4.6 10*3/uL — ABNORMAL HIGH (ref 0.7–4.0)
MCH: 30.5 pg (ref 26.0–34.0)
MCHC: 31.1 g/dL (ref 30.0–36.0)
MCV: 98.2 fL (ref 80.0–100.0)
Monocytes Absolute: 0.5 10*3/uL (ref 0.1–1.0)
Monocytes Relative: 5 %
Neutro Abs: 6 10*3/uL (ref 1.7–7.7)
Neutrophils Relative %: 53 %
Platelets: 431 10*3/uL — ABNORMAL HIGH (ref 150–400)
RBC: 4.36 MIL/uL (ref 3.87–5.11)
RDW: 14.2 % (ref 11.5–15.5)
WBC: 11.3 10*3/uL — ABNORMAL HIGH (ref 4.0–10.5)
nRBC: 0 % (ref 0.0–0.2)

## 2019-02-19 LAB — BASIC METABOLIC PANEL
Anion gap: 9 (ref 5–15)
BUN: 15 mg/dL (ref 6–20)
CO2: 25 mmol/L (ref 22–32)
Calcium: 8.9 mg/dL (ref 8.9–10.3)
Chloride: 104 mmol/L (ref 98–111)
Creatinine, Ser: 0.85 mg/dL (ref 0.44–1.00)
GFR calc Af Amer: >60 mL/min (ref >60)
GFR calc non Af Amer: >60 mL/min (ref >60)
Glucose, Bld: 198 mg/dL — ABNORMAL HIGH (ref 70–99)
Potassium: 4.2 mmol/L (ref 3.5–5.1)
Sodium: 138 mmol/L (ref 135–145)

## 2019-02-19 LAB — TROPONIN I (HIGH SENSITIVITY): Troponin I (High Sensitivity): <2 ng/L (ref <18)

## 2019-02-19 MED ORDER — EPINEPHRINE 0.3 MG/0.3ML IJ SOAJ: 0.3000 mg | INTRAMUSCULAR | 0 refills | Status: DC | PRN

## 2019-02-19 MED ORDER — ALBUTEROL SULFATE (2.5 MG/3ML) 0.083% IN NEBU
2.5000 mg | INHALATION_SOLUTION | Freq: Once | RESPIRATORY_TRACT | Status: AC
  Administered 2019-02-19: 2.5 mg via RESPIRATORY_TRACT
  Filled 2019-02-19: qty 3

## 2019-02-19 MED ORDER — METHYLPREDNISOLONE SODIUM SUCC 125 MG IJ SOLR
125.0000 mg | Freq: Once | INTRAMUSCULAR | Status: AC
  Administered 2019-02-19: 125 mg via INTRAVENOUS
  Filled 2019-02-19: qty 2

## 2019-02-19 NOTE — ED Provider Notes (Signed)
[facility] DEPT Provider Note   CSN: [id number] Arrival date & time: 02/19/19  0100     History   Chief Complaint Chief Complaint  Patient presents with  . Allergic Reaction    HPI [name] is a 53 y.o. female.     53 year old female brought in by EMS for allergic reaction.  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  Patient's husband gave her 2 epi pens, 1 at 1130 and 1 at 12:00 as well as 75 mg of Benadryl.  Patient's husband then did chest compressions as he felt she was unresponsive.  Patient is awake at this time, complains of a headache, pain in her chest and her throat feeling swollen.  No further medications given by EMS.     Past Medical History:  Diagnosis Date  . Anxiety   . Arthritis    osteoarthritis-knees  . Asthma   . Breast fibroadenoma, left   . Depression   . Diabetes mellitus without complication ([place])   . 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 GERD (gastroesophageal reflux disease)   . Headache(784.0)    occ. with allergies/sinus issues  . History of colon polyps 04/2016  . History of MRSA infection 2007  . Hypertension    no problems since weight loss -'09  . Multiple food allergies    Latex allergy-respiratory  . Obesity   . Tendonitis   . Vitamin D deficiency    history of    Patient Active Problem List   Diagnosis Date Noted  . Unstable angina ([place])   . Abnormal stress test   . Chest pain 02/09/2019  . Hypoglycemia 02/09/2019  . Syncope 02/09/2019  . Dyspnea on exertion 09/22/2018  . PFO (patent foramen ovale) 09/22/2018  . Type 2 diabetes mellitus without complication, without long-term current use of insulin ([place]) 09/22/2018  . S/P left TKA 02/15/2018  . Insomnia 02/06/2013  . Anxiety and depression 02/06/2013  . Unspecified  constipation 02/06/2013  . GERD (gastroesophageal reflux disease) 02/06/2013  . Anemia, iron deficiency 02/06/2013  . Expected blood loss anemia 02/01/2013  . Obese 02/01/2013  . S/P right TKA 01/31/2013  . Headache(784.0) 11/28/2012  . Stroke-like symptoms 11/26/2012  . CHEST PAIN UNSPECIFIED 03/06/2009  . DEPRESSION 03/05/2009  . Essential hypertension 03/05/2009  . FATTY LIVER DISEASE 03/05/2009    Past Surgical History:  Procedure Laterality Date  . ABDOMINAL HYSTERECTOMY    . BOWEL RESECTION    . CESAREAN SECTION     x4  . COLONOSCOPY W/ POLYPECTOMY  04/2016  . KNEE SURGERY     Right knee scope 8'12  . LEFT HEART CATH AND CORONARY ANGIOGRAPHY N/A 02/13/2019   Procedure: LEFT HEART CATH AND CORONARY ANGIOGRAPHY;  Surgeon: Nelva Bush, MD;  Location: [facility];  Service: Cardiovascular;  Laterality: N/A;  . TOTAL KNEE ARTHROPLASTY Right 01/31/2013   Procedure: RIGHT TOTAL KNEE ARTHROPLASTY;  Surgeon: [name], MD;  Location: [facility];  Service: Orthopedics;  Laterality: Right;  . TOTAL KNEE ARTHROPLASTY Left 02/15/2018   Procedure: LEFT TOTAL KNEE ARTHROPLASTY;  Surgeon: Paralee Cancel, MD;  Location: [facility];  Service: Orthopedics;  Laterality: Left;  68     OB History   No obstetric history on file.      Home Medications    Prior to  Admission medications   Medication Sig Start Date End Date Taking? Authorizing Provider  albuterol (PROVENTIL HFA;VENTOLIN HFA) 108 (90 BASE) MCG/ACT inhaler Inhale 2 puffs into the lungs every 6 (six) hours as needed for wheezing or shortness of breath.   Yes [provider]  aspirin 81 MG chewable tablet Chew 81 mg by mouth daily.   Yes [provider]  atorvastatin (LIPITOR) 10 MG tablet Take 10 mg by mouth at bedtime.   Yes [provider]  BREO ELLIPTA 200-25 MCG/INH AEPB Inhale 1 puff into the lungs daily. 12/28/17  Yes [provider]  busPIRone (BUSPAR) 15 MG tablet Take 15 mg by  mouth 3 (three) times daily.   Yes [provider]  carvedilol (COREG) 6.25 MG tablet Take 1 tablet (6.25 mg total) by mouth 2 (two) times daily with a meal. 02/14/19  Yes Bonnell Public, MD  diazepam (VALIUM) 10 MG tablet Take 10 mg by mouth 4 (four) times daily.   Yes [provider]  fluticasone (FLONASE) 50 MCG/ACT nasal spray Place 2 sprays into both nostrils daily as needed for allergies.    Yes [provider]  glipiZIDE (GLUCOTROL XL) 10 MG 24 hr tablet Take 1 tablet by mouth daily. 10/17/18  Yes [provider]  lisinopril (PRINIVIL,ZESTRIL) 10 MG tablet Take 10 mg by mouth daily.   Yes [provider]  omeprazole (PRILOSEC) 20 MG capsule Take 20 mg by mouth daily.   Yes [provider]  OZEMPIC, 0.25 OR 0.5 MG/DOSE, 2 MG/1.5ML SOPN 1 mg by Subconjunctival route See admin instructions. On Tuesdays 10/31/18  Yes [provider]  QUEtiapine (SEROQUEL) 100 MG tablet Take 100 mg by mouth at bedtime. 10/27/17  Yes [provider]  raloxifene (EVISTA) 60 MG tablet Take 60 mg by mouth daily. 02/14/19  Yes [provider]  rOPINIRole (REQUIP) 1 MG tablet Take 1 mg by mouth at bedtime.   Yes [provider]  EPINEPHrine 0.3 mg/0.3 mL IJ SOAJ injection Inject 0.3 mLs (0.3 mg total) into the muscle as needed for anaphylaxis. 02/19/19   Tacy Learn, PA-C  methocarbamol (ROBAXIN) 500 MG tablet Take 1 tablet (500 mg total) by mouth every 6 (six) hours as needed for muscle spasms. Patient not taking: Reported on 02/08/2019 02/15/18   [name], PA-C  montelukast (SINGULAIR) 10 MG tablet Take 10 mg by mouth daily. 10/06/17   [provider]  polyethylene glycol (MIRALAX / GLYCOLAX) 17 g packet Take 17 g by mouth daily.    [provider]    Family History Family History  Problem Relation Age of Onset  . Lung cancer Mother   . Diabetes Father   . Colon cancer Neg Hx     Social  History Social History   Tobacco Use  . Smoking status: Former Smoker    Quit date: 01/25/2005    Years since quitting: 14.0  . Smokeless tobacco: Never Used  Substance Use Topics  . Alcohol use: No  . Drug use: No     Allergies   Banana, Latex, Onion, Penicillins, Tylenol [acetaminophen], Codeine, Hydrocodone, and Insect wax   Review of Systems Review of Systems  Constitutional: Negative for fever.  HENT: Positive for sore throat.   Respiratory: Negative for shortness of breath.   Cardiovascular: Positive for chest pain.  Gastrointestinal: Negative for abdominal pain, nausea and vomiting.  Skin: Negative for rash and wound.  Neurological: Positive for headaches.  All other systems reviewed and  are negative.    Physical Exam Updated Vital Signs BP (!) 137/96   Pulse 92   Temp 97.7 F (36.5 C) (Oral)   Resp 20   SpO2 99%   Physical Exam Vitals signs and nursing note reviewed.  Constitutional:      General: She is not in acute distress.    Appearance: She is well-developed. She is not diaphoretic.  HENT:     Head: Normocephalic and atraumatic.     Mouth/Throat:     Mouth: Mucous membranes are moist.  Cardiovascular:     Rate and Rhythm: Normal rate and regular rhythm.     Pulses: Normal pulses.     Heart sounds: Normal heart sounds.  Pulmonary:     Effort: Pulmonary effort is normal.     Breath sounds: Normal breath sounds.  Chest:     Chest wall: Tenderness present.  Abdominal:     Palpations: Abdomen is soft.     Tenderness: There is no abdominal tenderness.  Musculoskeletal:        General: No swelling.  Skin:    General: Skin is warm and dry.     Findings: No erythema or rash.  Neurological:     Mental Status: She is alert and oriented to person, place, and time.  Psychiatric:        Behavior: Behavior normal.      ED Treatments / Results  Labs (all labs ordered are listed, but only abnormal results are displayed) Labs Reviewed  BASIC  METABOLIC PANEL - Abnormal; Notable for the following components:      Result Value   Glucose, Bld 198 (*)    All other components within normal limits  CBC WITH DIFFERENTIAL/PLATELET - Abnormal; Notable for the following components:   WBC 11.3 (*)    Platelets 431 (*)    Lymphs Abs 4.6 (*)    All other components within normal limits  TROPONIN I (HIGH SENSITIVITY)  TROPONIN I (HIGH SENSITIVITY)    EKG EKG Interpretation  Date/Time:  Sunday February 19 2019 01:15:58 EST Ventricular Rate:  86 PR Interval:    QRS Duration: 115 QT Interval:  404 QTC Calculation: 484 R Axis:   -51 Text Interpretation: Sinus rhythm Left anterior fascicular block Consider anterior infarct No significant change since last tracing Confirmed by Orpah Greek [id number]) on 02/19/2019 2:15:44 AM   Radiology Dg Chest Port 1 View  Result Date: 02/19/2019 CLINICAL DATA:  Chest pain EXAM: PORTABLE CHEST 1 VIEW COMPARISON:  02/08/2019 FINDINGS: Cardiac shadow is mildly enlarged but stable. The overall inspiratory effort is poor. No focal infiltrate or sizable effusion is seen. Minimal left basilar atelectasis remains. No bony abnormality is seen. IMPRESSION: Minimal left basilar atelectasis stable from the prior study. No acute abnormality is noted. Electronically Signed   By: Inez Catalina M.D.   On: 02/19/2019 01:31    Procedures Procedures (including critical care time)  Medications Ordered in ED Medications  methylPREDNISolone sodium succinate (SOLU-MEDROL) 125 mg/2 mL injection 125 mg (125 mg Intravenous Given 02/19/19 0204)  albuterol (PROVENTIL) (2.5 MG/3ML) 0.083% nebulizer solution 2.5 mg (2.5 mg Nebulization Given 02/19/19 0146)     Initial Impression / Assessment and Plan / ED Course  I have reviewed the triage vital signs and the nursing notes.  Pertinent labs & imaging results that were available during my care of the patient were reviewed by me and considered in my medical decision  making (see chart for details).  Clinical Course as  of Feb 19 351  Nancy Fetter [birth year]  [id number] 53 year old female brought in by EMS for possible allergic reaction.  Patient used EpiPen x2 at home and took 75 mg of Benadryl prior to arrival.  On arrival patient complained of a headache, felt her throat was sore and had pain in her chest from where her husband had done chest compressions on her.  Patient has been observed in the emergency room.  Patient feels better at this time, she is wake, alert, oriented and would like to be discharged to go home.  Patient will be discharged with refill of her EpiPen, recommend she continue with Benadryl and Pepcid and follow-up with her PCP.   [LM]    Clinical Course User Index [LM] Tacy Learn, PA-C       Final Clinical Impressions(s) / ED Diagnoses   Final diagnoses:  Allergic reaction, initial encounter    ED Discharge Orders         Ordered    EPINEPHrine 0.3 mg/0.3 mL IJ SOAJ injection  As needed     02/19/19 0351           Tacy Learn, PA-C 02/19/19 [id number]    Orpah Greek, MD 02/19/19 [phone]

## 2019-02-19 NOTE — Discharge Instructions (Addendum)
Continue with Benadryl and Pepcid as directed. EpiPen as needed as directed, return to ER if you need to use your EpiPen. Your primary care provider this week, return to ER for new or worsening symptoms.

## 2019-02-19 NOTE — ED Triage Notes (Signed)
BIB EMS from home. EMS reports Pt husband stated pt had a allergic reaction to onion at 5 husband gave 2 Epipens one at 2330 and one 0000 husband also 61 benadryl  Husband also administer unknown amount of chest compressions due to pt being unresponsive after medications  CBG 216 hx of diabetes 136/90 86

## 2019-02-19 NOTE — ED Notes (Signed)
X-ray at bedside

## 2019-02-19 NOTE — ED Notes (Signed)
Went into pt room to discharge pt. Pt was not there. Tech at nursing station stated pt walked by and told him that she was leaving.

## 2019-04-14 ENCOUNTER — Ambulatory Visit: Payer: Medicare Other | Admitting: Cardiology

## 2019-05-15 DIAGNOSIS — G2581 Restless legs syndrome: Secondary | ICD-10-CM

## 2019-05-15 HISTORY — DX: Restless legs syndrome: G25.81

## 2019-05-24 ENCOUNTER — Telehealth: Payer: Self-pay | Admitting: Cardiology

## 2019-05-24 NOTE — Telephone Encounter (Signed)
New Message:    Dr Loura Back would like pt's Echo faxed to him at [phone] please.

## 2019-05-24 NOTE — Telephone Encounter (Signed)
Echo report printed and faxed to Dr Alcide Clever [phone]

## 2019-12-11 ENCOUNTER — Other Ambulatory Visit: Payer: Self-pay

## 2019-12-11 ENCOUNTER — Encounter (HOSPITAL_COMMUNITY): Payer: Self-pay

## 2019-12-11 ENCOUNTER — Emergency Department (HOSPITAL_COMMUNITY)
Admission: EM | Admit: 2019-12-11 | Discharge: 2019-12-12 | Disposition: A | Discharge status: Home / Self Care | Payer: Medicare Other | Attending: Emergency Medicine | Admitting: Emergency Medicine

## 2019-12-11 DIAGNOSIS — Z91040 Latex allergy status: Secondary | ICD-10-CM | POA: Insufficient documentation

## 2019-12-11 DIAGNOSIS — E119 Type 2 diabetes mellitus without complications: Secondary | ICD-10-CM | POA: Insufficient documentation

## 2019-12-11 DIAGNOSIS — T464X5A Adverse effect of angiotensin-converting-enzyme inhibitors, initial encounter: Secondary | ICD-10-CM

## 2019-12-11 DIAGNOSIS — I10 Essential (primary) hypertension: Secondary | ICD-10-CM | POA: Insufficient documentation

## 2019-12-11 DIAGNOSIS — L299 Pruritus, unspecified: Secondary | ICD-10-CM | POA: Insufficient documentation

## 2019-12-11 DIAGNOSIS — Z79899 Other long term (current) drug therapy: Secondary | ICD-10-CM | POA: Insufficient documentation

## 2019-12-11 DIAGNOSIS — Z87891 Personal history of nicotine dependence: Secondary | ICD-10-CM | POA: Diagnosis not present

## 2019-12-11 DIAGNOSIS — T7840XA Allergy, unspecified, initial encounter: Secondary | ICD-10-CM | POA: Diagnosis present

## 2019-12-11 DIAGNOSIS — Z7982 Long term (current) use of aspirin: Secondary | ICD-10-CM | POA: Insufficient documentation

## 2019-12-11 DIAGNOSIS — Z7984 Long term (current) use of oral hypoglycemic drugs: Secondary | ICD-10-CM | POA: Insufficient documentation

## 2019-12-11 DIAGNOSIS — R220 Localized swelling, mass and lump, head: Secondary | ICD-10-CM | POA: Diagnosis not present

## 2019-12-11 DIAGNOSIS — Z96653 Presence of artificial knee joint, bilateral: Secondary | ICD-10-CM | POA: Insufficient documentation

## 2019-12-11 DIAGNOSIS — J45909 Unspecified asthma, uncomplicated: Secondary | ICD-10-CM | POA: Insufficient documentation

## 2019-12-11 MED ORDER — METHYLPREDNISOLONE SODIUM SUCC 125 MG IJ SOLR
125.0000 mg | Freq: Once | INTRAMUSCULAR | Status: AC
  Administered 2019-12-11: 125 mg via INTRAVENOUS
  Filled 2019-12-11: qty 2

## 2019-12-11 MED ORDER — FAMOTIDINE IN NACL 20-0.9 MG/50ML-% IV SOLN
20.0000 mg | Freq: Once | INTRAVENOUS | Status: AC
  Administered 2019-12-11: 20 mg via INTRAVENOUS
  Filled 2019-12-11: qty 50

## 2019-12-11 MED ORDER — DIPHENHYDRAMINE HCL 50 MG/ML IJ SOLN
25.0000 mg | Freq: Once | INTRAMUSCULAR | Status: AC
  Administered 2019-12-11: 25 mg via INTRAVENOUS
  Filled 2019-12-11: qty 1

## 2019-12-11 NOTE — ED Triage Notes (Signed)
Patient arrived with complaint of an allergic reaction over the last three days to an unknown allergen. Reports waking up feeling like she had difficulty swallowing and given an at home epi pen roughly 5 hours ago. Also received epi from EMS prior to arrival due to symptoms reoccuring. Patient speaking in full sentences in triage. Last dose of Benadryl given at 4pm, complaints of itching.

## 2019-12-11 NOTE — ED Provider Notes (Signed)
[name] is a 54 y.o. female.  The history is provided by the patient.  Allergic Reaction Presenting symptoms: difficulty swallowing, itching and swelling   Severity:  Moderate Duration:  3 days Prior allergic episodes:  Food/nut allergies Context: medications   Context comment:  Lisinopril Relieved by:  Nothing Worsened by:  Nothing Ineffective treatments:  Epinephrine and antihistamines (last dose 4 pm )      Past Medical History:  Diagnosis Date  . Anxiety   . Arthritis    osteoarthritis-knees  . Asthma   . Breast fibroadenoma, left   . Depression   . Diabetes mellitus without complication ([place])   . 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 GERD (gastroesophageal reflux disease)   . Headache(784.0)    occ. with allergies/sinus issues  . History of colon polyps 04/2016  . History of MRSA infection 2007  . Hypertension    no problems since weight loss -'09  . Multiple food allergies    Latex allergy-respiratory  . Obesity   . Tendonitis   . Vitamin D deficiency    history of    Patient Active Problem List   Diagnosis Date Noted  . Unstable angina ([place])   . Abnormal stress test   . Chest pain 02/09/2019  . Hypoglycemia 02/09/2019  . Syncope 02/09/2019  . Dyspnea on exertion 09/22/2018  . PFO (patent foramen ovale) 09/22/2018  . Type 2 diabetes mellitus without complication, without long-term current use of insulin ([place]) 09/22/2018  . S/P left TKA 02/15/2018  . Insomnia 02/06/2013  . Anxiety and depression 02/06/2013  . Unspecified constipation 02/06/2013  . GERD (gastroesophageal reflux disease) 02/06/2013  . Anemia, iron deficiency 02/06/2013  . Expected blood loss anemia 02/01/2013  . Obese  02/01/2013  . S/P right TKA 01/31/2013  . Headache(784.0) 11/28/2012  . Stroke-like symptoms 11/26/2012  . CHEST PAIN UNSPECIFIED 03/06/2009  . DEPRESSION 03/05/2009  . Essential hypertension 03/05/2009  . FATTY LIVER DISEASE 03/05/2009    Past Surgical History:  Procedure Laterality Date  . ABDOMINAL HYSTERECTOMY    . BOWEL RESECTION    . CESAREAN SECTION     x4  . COLONOSCOPY W/ POLYPECTOMY  04/2016  . KNEE SURGERY     Right knee scope 8'12  . LEFT HEART CATH AND CORONARY ANGIOGRAPHY N/A 02/13/2019   Procedure: LEFT HEART CATH AND CORONARY ANGIOGRAPHY;  Surgeon: Nelva Bush, MD;  Location: [facility];  Service: Cardiovascular;  Laterality: N/A;  . TOTAL KNEE ARTHROPLASTY Right 01/31/2013   Procedure: RIGHT TOTAL KNEE ARTHROPLASTY;  Surgeon: [name], MD;  Location: [facility];  Service: Orthopedics;  Laterality: Right;  . TOTAL KNEE ARTHROPLASTY Left 02/15/2018   Procedure: LEFT TOTAL KNEE ARTHROPLASTY;  Surgeon: Paralee Cancel, MD;  Location: [facility];  Service: Orthopedics;  Laterality: Left;  70     OB History   No obstetric history on file.     Family History  Problem Relation Age of Onset  . Lung cancer Mother   . Diabetes Father   . Colon cancer Neg Hx     Social History   Tobacco Use  . Smoking status: Former Smoker    Quit date: 01/25/2005    Years  since quitting: 14.8  . Smokeless tobacco: Never Used  Vaping Use  . Vaping Use: Never used  Substance Use Topics  . Alcohol use: No  . Drug use: No    Home Medications Prior to Admission medications   Medication Sig Start Date End Date Taking? Authorizing Provider  albuterol (PROVENTIL HFA;VENTOLIN HFA) 108 (90 BASE) MCG/ACT inhaler Inhale 2 puffs into the lungs every 6 (six) hours as needed for wheezing or shortness of breath.    [provider]  aspirin 81 MG chewable tablet Chew 81 mg by mouth daily.    [provider]  atorvastatin (LIPITOR) 10 MG tablet Take 10 mg by  mouth at bedtime.    [provider]  BREO ELLIPTA 200-25 MCG/INH AEPB Inhale 1 puff into the lungs daily. 12/28/17   [provider]  busPIRone (BUSPAR) 15 MG tablet Take 15 mg by mouth 3 (three) times daily.    [provider]  carvedilol (COREG) 6.25 MG tablet Take 1 tablet (6.25 mg total) by mouth 2 (two) times daily with a meal. 02/14/19   Bonnell Public, MD  diazepam (VALIUM) 10 MG tablet Take 10 mg by mouth 4 (four) times daily.    [provider]  EPINEPHrine 0.3 mg/0.3 mL IJ SOAJ injection Inject 0.3 mLs (0.3 mg total) into the muscle as needed for anaphylaxis. 02/19/19   Tacy Learn, PA-C  fluticasone (FLONASE) 50 MCG/ACT nasal spray Place 2 sprays into both nostrils daily as needed for allergies.     [provider]  glipiZIDE (GLUCOTROL XL) 10 MG 24 hr tablet Take 1 tablet by mouth daily. 10/17/18   [provider]  lisinopril (PRINIVIL,ZESTRIL) 10 MG tablet Take 10 mg by mouth daily.    [provider]  methocarbamol (ROBAXIN) 500 MG tablet Take 1 tablet (500 mg total) by mouth every 6 (six) hours as needed for muscle spasms. Patient not taking: Reported on 02/08/2019 02/15/18   [name], PA-C  montelukast (SINGULAIR) 10 MG tablet Take 10 mg by mouth daily. 10/06/17   [provider]  omeprazole (PRILOSEC) 20 MG capsule Take 20 mg by mouth daily.    [provider]  OZEMPIC, 0.25 OR 0.5 MG/DOSE, 2 MG/1.5ML SOPN 1 mg by Subconjunctival route See admin instructions. On Tuesdays 10/31/18   [provider]  polyethylene glycol (MIRALAX / GLYCOLAX) 17 g packet Take 17 g by mouth daily.    [provider]  QUEtiapine (SEROQUEL) 100 MG tablet Take 100 mg by mouth at bedtime. 10/27/17   [provider]  raloxifene (EVISTA) 60 MG tablet Take 60 mg by mouth daily. 02/14/19   [provider]  rOPINIRole (REQUIP) 1 MG tablet Take 1 mg by mouth at bedtime.    [provider]    Allergies    Banana, Latex, Onion, Penicillins, Tylenol [acetaminophen], Codeine, Hydrocodone, Insect wax, and Lisinopril-hydrochlorothiazide  Review of Systems   Review of Systems  Constitutional: Negative for fever.  HENT: Positive for facial swelling and trouble swallowing. Negative for sore throat and voice change.   Eyes: Negative for visual disturbance.  Respiratory: Negative for shortness of breath and stridor.   Cardiovascular: Negative for chest pain.  Gastrointestinal: Negative for abdominal pain.  Genitourinary: Negative for difficulty urinating.  Musculoskeletal: Negative for arthralgias.  Skin: Positive for itching. Negative for pallor.  Neurological: Negative for dizziness.  Psychiatric/Behavioral: Negative for agitation.  All other systems reviewed and are negative.   Physical Exam Updated Vital  Signs BP 124/80 (BP Location: Right Arm)   Pulse 74   Temp 98.4 F (36.9 C) (Oral)   Resp 18   SpO2 92%   Physical Exam Vitals and nursing note reviewed.  Constitutional:      General: She is not in acute distress.    Appearance: Normal appearance.  HENT:     Head: Normocephalic and atraumatic.     Nose: Nose normal.     Mouth/Throat:     Mouth: Mucous membranes are moist.     Pharynx: Oropharynx is clear.     Comments: No swelling of the lips tongue or uvula  Eyes:     Conjunctiva/sclera: Conjunctivae normal.     Pupils: Pupils are equal, round, and reactive to light.  Cardiovascular:     Rate and Rhythm: Normal rate and regular rhythm.     Pulses: Normal pulses.     Heart sounds: Normal heart sounds.  Pulmonary:     Effort: Pulmonary effort is normal. No respiratory distress.     Breath sounds: Normal breath sounds. No stridor.  Abdominal:     General: Abdomen is flat. Bowel sounds are normal.     Palpations: Abdomen is soft.     Tenderness: There is no abdominal tenderness. There is no guarding.  Musculoskeletal:        General:  Normal range of motion.     Cervical back: Normal range of motion and neck supple.  Skin:    General: Skin is warm and dry.     Capillary Refill: Capillary refill takes less than 2 seconds.  Neurological:     General: No focal deficit present.     Mental Status: She is alert and oriented to person, place, and time.     Deep Tendon Reflexes: Reflexes normal.  Psychiatric:        Mood and Affect: Mood normal.        Behavior: Behavior normal.     ED Results / Procedures / Treatments   Labs (all labs ordered are listed, but only abnormal results are displayed) Labs Reviewed - No data to display  EKG None  Radiology No results found.  Procedures Procedures (including critical care time)  Medications Ordered in ED Medications  famotidine (PEPCID) IVPB 20 mg premix (has no administration in time range)  diphenhydrAMINE (BENADRYL) injection 25 mg (has no administration in time range)  methylPREDNISolone sodium succinate (SOLU-MEDROL) 125 mg/2 mL injection 125 mg (has no administration in time range)    ED Course  I have reviewed the triage vital signs and the nursing notes.  Pertinent labs & imaging results that were available during my care of the patient were reviewed by me and considered in my medical decision making (see chart for details).    Observed for 4 hours.  States symptoms are improved.  No airway swelling.  No SOB.  PO challenged successfully.  Stop ACE-I and call your doctor to let them know about the reaction.  Will start steroids and pepcid and have advised benadryl every 6 hours x 2 days.  Strict return precautions given.    Anyra Kaufman was evaluated in Emergency Department on 12/12/2019 for the symptoms described in the history of present illness. She was evaluated in the context of the global COVID-19 pandemic, which necessitated consideration that the patient might be at risk for infection with the SARS-CoV-2 virus that causes COVID-19. Institutional protocols  and algorithms that pertain to the evaluation of patients at risk for COVID-19  are in a state of rapid change based on information released by regulatory bodies including the CDC and federal and state organizations. These policies and algorithms were followed during the patient's care in the ED.  Final Clinical Impression(s) / ED Diagnoses Return for intractable cough, coughing up blood,fevers >100.4 unrelieved by medication, shortness of breath, intractable vomiting, chest pain, shortness of breath, weakness,numbness, changes in speech, facial asymmetry,abdominal pain, passing out,Inability to tolerate liquids or food, cough, altered mental status or any concerns. No signs of systemic illness or infection. The patient is nontoxic-appearing on exam and vital signs are within normal limits.   I have reviewed the triage vital signs and the nursing notes. Pertinent labs &imaging results that were available during my care of the patient were reviewed by me and considered in my medical decision making (see chart for details).After history, exam, and medical workup I feel the patient has beenappropriately medically screened and is safe for discharge home. Pertinent diagnoses were discussed with the patient. Patient was given return precautions.      Margaret Staggs, MD 12/12/19 [id number]

## 2019-12-12 ENCOUNTER — Encounter (HOSPITAL_COMMUNITY): Payer: Self-pay | Admitting: Emergency Medicine

## 2019-12-12 DIAGNOSIS — R220 Localized swelling, mass and lump, head: Secondary | ICD-10-CM | POA: Diagnosis not present

## 2019-12-12 LAB — CBC WITH DIFFERENTIAL/PLATELET
Abs Immature Granulocytes: 0.12 10*3/uL — ABNORMAL HIGH (ref 0.00–0.07)
Basophils Absolute: 0 10*3/uL (ref 0.0–0.1)
Basophils Relative: 0 %
Eosinophils Absolute: 0.2 10*3/uL (ref 0.0–0.5)
Eosinophils Relative: 2 %
HCT: 42.3 % (ref 36.0–46.0)
Hemoglobin: 13.4 g/dL (ref 12.0–15.0)
Immature Granulocytes: 1 %
Lymphocytes Relative: 44 %
Lymphs Abs: 5.9 10*3/uL — ABNORMAL HIGH (ref 0.7–4.0)
MCH: 30.3 pg (ref 26.0–34.0)
MCHC: 31.7 g/dL (ref 30.0–36.0)
MCV: 95.7 fL (ref 80.0–100.0)
Monocytes Absolute: 0.6 10*3/uL (ref 0.1–1.0)
Monocytes Relative: 5 %
Neutro Abs: 6.5 10*3/uL (ref 1.7–7.7)
Neutrophils Relative %: 48 %
Platelets: 401 10*3/uL — ABNORMAL HIGH (ref 150–400)
RBC: 4.42 MIL/uL (ref 3.87–5.11)
RDW: 13.7 % (ref 11.5–15.5)
WBC: 13.4 10*3/uL — ABNORMAL HIGH (ref 4.0–10.5)
nRBC: 0 % (ref 0.0–0.2)

## 2019-12-12 LAB — I-STAT CHEM 8, ED
BUN: 22 mg/dL — ABNORMAL HIGH (ref 6–20)
Calcium, Ion: 1.1 mmol/L — ABNORMAL LOW (ref 1.15–1.40)
Chloride: 102 mmol/L (ref 98–111)
Creatinine, Ser: 0.9 mg/dL (ref 0.44–1.00)
Glucose, Bld: 112 mg/dL — ABNORMAL HIGH (ref 70–99)
HCT: 43 % (ref 36.0–46.0)
Hemoglobin: 14.6 g/dL (ref 12.0–15.0)
Potassium: 4.6 mmol/L (ref 3.5–5.1)
Sodium: 141 mmol/L (ref 135–145)
TCO2: 28 mmol/L (ref 22–32)

## 2019-12-12 MED ORDER — PREDNISONE 20 MG PO TABS: ORAL_TABLET | ORAL | 0 refills | Status: DC

## 2019-12-12 MED ORDER — FAMOTIDINE 20 MG PO TABS: 20.0000 mg | ORAL_TABLET | Freq: Two times a day (BID) | ORAL | 0 refills | Status: DC

## 2019-12-16 ENCOUNTER — Emergency Department (HOSPITAL_COMMUNITY)
Admission: EM | Admit: 2019-12-16 | Discharge: 2019-12-17 | Disposition: A | Discharge status: Home / Self Care | Payer: Medicare Other | Attending: Emergency Medicine | Admitting: Emergency Medicine

## 2019-12-16 ENCOUNTER — Other Ambulatory Visit: Payer: Self-pay

## 2019-12-16 ENCOUNTER — Encounter (HOSPITAL_COMMUNITY): Payer: Self-pay | Admitting: Emergency Medicine

## 2019-12-16 DIAGNOSIS — R197 Diarrhea, unspecified: Secondary | ICD-10-CM | POA: Diagnosis not present

## 2019-12-16 DIAGNOSIS — R112 Nausea with vomiting, unspecified: Secondary | ICD-10-CM | POA: Diagnosis present

## 2019-12-16 DIAGNOSIS — Z5321 Procedure and treatment not carried out due to patient leaving prior to being seen by health care provider: Secondary | ICD-10-CM | POA: Diagnosis not present

## 2019-12-16 LAB — COMPREHENSIVE METABOLIC PANEL
ALT: 22 U/L (ref 0–44)
AST: 16 U/L (ref 15–41)
Albumin: 3.1 g/dL — ABNORMAL LOW (ref 3.5–5.0)
Alkaline Phosphatase: 80 U/L (ref 38–126)
Anion gap: 7 (ref 5–15)
BUN: 9 mg/dL (ref 6–20)
CO2: 25 mmol/L (ref 22–32)
Calcium: 8.5 mg/dL — ABNORMAL LOW (ref 8.9–10.3)
Chloride: 107 mmol/L (ref 98–111)
Creatinine, Ser: 0.73 mg/dL (ref 0.44–1.00)
GFR calc Af Amer: >60 mL/min (ref >60)
GFR calc non Af Amer: >60 mL/min (ref >60)
Glucose, Bld: 222 mg/dL — ABNORMAL HIGH (ref 70–99)
Potassium: 4.1 mmol/L (ref 3.5–5.1)
Sodium: 139 mmol/L (ref 135–145)
Total Bilirubin: 0.5 mg/dL (ref 0.3–1.2)
Total Protein: 6 g/dL — ABNORMAL LOW (ref 6.5–8.1)

## 2019-12-16 LAB — URINALYSIS, ROUTINE W REFLEX MICROSCOPIC
Bilirubin Urine: NEGATIVE
Glucose, UA: >=500 mg/dL — AB
Hgb urine dipstick: NEGATIVE
Ketones, ur: NEGATIVE mg/dL
Leukocytes,Ua: NEGATIVE
Nitrite: NEGATIVE
Protein, ur: NEGATIVE mg/dL
Specific Gravity, Urine: 1.011 (ref 1.005–1.030)
pH: 6 (ref 5.0–8.0)

## 2019-12-16 LAB — CBC
HCT: 38.5 % (ref 36.0–46.0)
Hemoglobin: 12.5 g/dL (ref 12.0–15.0)
MCH: 31.1 pg (ref 26.0–34.0)
MCHC: 32.5 g/dL (ref 30.0–36.0)
MCV: 95.8 fL (ref 80.0–100.0)
Platelets: 425 10*3/uL — ABNORMAL HIGH (ref 150–400)
RBC: 4.02 MIL/uL (ref 3.87–5.11)
RDW: 13.6 % (ref 11.5–15.5)
WBC: 10 10*3/uL (ref 4.0–10.5)
nRBC: 0 % (ref 0.0–0.2)

## 2019-12-16 LAB — LIPASE, BLOOD: Lipase: 39 U/L (ref 11–51)

## 2019-12-16 NOTE — ED Triage Notes (Signed)
Pt reports having episodes of nausea, vomiting, and diarrhea that started yesterday. Pt reports 3 episodes of vomiting and diarrhea.

## 2020-06-21 ENCOUNTER — Emergency Department (HOSPITAL_COMMUNITY)
Admission: EM | Admit: 2020-06-21 | Discharge: 2020-06-22 | Disposition: A | Discharge status: Home / Self Care | Payer: Medicare Other | Attending: Emergency Medicine | Admitting: Emergency Medicine

## 2020-06-21 ENCOUNTER — Encounter (HOSPITAL_COMMUNITY): Payer: Self-pay | Admitting: Emergency Medicine

## 2020-06-21 ENCOUNTER — Other Ambulatory Visit: Payer: Self-pay

## 2020-06-21 DIAGNOSIS — Z79899 Other long term (current) drug therapy: Secondary | ICD-10-CM | POA: Diagnosis not present

## 2020-06-21 DIAGNOSIS — I10 Essential (primary) hypertension: Secondary | ICD-10-CM | POA: Diagnosis not present

## 2020-06-21 DIAGNOSIS — Z7984 Long term (current) use of oral hypoglycemic drugs: Secondary | ICD-10-CM | POA: Insufficient documentation

## 2020-06-21 DIAGNOSIS — Z91040 Latex allergy status: Secondary | ICD-10-CM | POA: Diagnosis not present

## 2020-06-21 DIAGNOSIS — Z7982 Long term (current) use of aspirin: Secondary | ICD-10-CM | POA: Insufficient documentation

## 2020-06-21 DIAGNOSIS — Z96653 Presence of artificial knee joint, bilateral: Secondary | ICD-10-CM | POA: Insufficient documentation

## 2020-06-21 DIAGNOSIS — Z87891 Personal history of nicotine dependence: Secondary | ICD-10-CM | POA: Diagnosis not present

## 2020-06-21 DIAGNOSIS — T7840XA Allergy, unspecified, initial encounter: Secondary | ICD-10-CM | POA: Diagnosis present

## 2020-06-21 DIAGNOSIS — J45909 Unspecified asthma, uncomplicated: Secondary | ICD-10-CM | POA: Diagnosis not present

## 2020-06-21 DIAGNOSIS — T782XXA Anaphylactic shock, unspecified, initial encounter: Secondary | ICD-10-CM | POA: Insufficient documentation

## 2020-06-21 DIAGNOSIS — E119 Type 2 diabetes mellitus without complications: Secondary | ICD-10-CM | POA: Insufficient documentation

## 2020-06-21 LAB — CBG MONITORING, ED: Glucose-Capillary: 101 mg/dL — ABNORMAL HIGH (ref 70–99)

## 2020-06-21 MED ORDER — PREDNISONE 10 MG PO TABS: 40.0000 mg | ORAL_TABLET | Freq: Every day | ORAL | 0 refills | Status: DC

## 2020-06-21 MED ORDER — EPINEPHRINE 0.3 MG/0.3ML IJ SOAJ: 0.3000 mg | INTRAMUSCULAR | 2 refills | Status: DC | PRN

## 2020-06-21 MED ORDER — FAMOTIDINE IN NACL 20-0.9 MG/50ML-% IV SOLN
20.0000 mg | Freq: Once | INTRAVENOUS | Status: AC
  Administered 2020-06-21: 20 mg via INTRAVENOUS
  Filled 2020-06-21: qty 50

## 2020-06-21 MED ORDER — METHYLPREDNISOLONE SODIUM SUCC 125 MG IJ SOLR
125.0000 mg | Freq: Once | INTRAMUSCULAR | Status: AC
  Administered 2020-06-21: 125 mg via INTRAVENOUS
  Filled 2020-06-21: qty 2

## 2020-06-21 NOTE — ED Provider Notes (Signed)
[facility] DEPT Provider Note   CSN: [id number] Arrival date & time: 06/21/20  2051     History Chief Complaint  Patient presents with  . Allergic Reaction    [name] is a 55 y.o. female.  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  Had a sloppy Joe today that had onions in it.  Started having throat tightness itching rash and wheezing.  Administered her own EpiPen.   Allergic Reaction Presenting symptoms: difficulty breathing, difficulty swallowing, itching, rash, swelling and wheezing   Severity:  Severe Prior allergic episodes:  Food/nut allergies Context comment:  Onions in sloppy joes Relieved by:  Antihistamines and epinephrine Worsened by:  Nothing Ineffective treatments:  None tried      Past Medical History:  Diagnosis Date  . Anxiety   . Arthritis    osteoarthritis-knees  . Asthma   . Breast fibroadenoma, left   . Depression   . Diabetes mellitus without complication ([place])   . 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 GERD (gastroesophageal reflux disease)   . Headache(784.0)    occ. with allergies/sinus issues  . History of colon polyps 04/2016  . History of MRSA infection 2007  . Hypertension    no problems since weight loss -'09  . Multiple food allergies    Latex allergy-respiratory  . Obesity   . Tendonitis   . Vitamin D deficiency    history of    Patient Active Problem List   Diagnosis Date Noted  . Unstable angina ([place])   . Abnormal stress test   . Chest pain 02/09/2019  . Hypoglycemia 02/09/2019  . Syncope 02/09/2019  . Dyspnea on exertion 09/22/2018  . PFO (patent foramen ovale) 09/22/2018  . Type 2 diabetes mellitus without complication, without long-term current use of insulin ([place]) 09/22/2018  . S/P left TKA 02/15/2018  . Insomnia 02/06/2013  . Anxiety and depression 02/06/2013  . Unspecified constipation 02/06/2013  . GERD  (gastroesophageal reflux disease) 02/06/2013  . Anemia, iron deficiency 02/06/2013  . Expected blood loss anemia 02/01/2013  . Obese 02/01/2013  . S/P right TKA 01/31/2013  . Headache(784.0) 11/28/2012  . Stroke-like symptoms 11/26/2012  . CHEST PAIN UNSPECIFIED 03/06/2009  . DEPRESSION 03/05/2009  . Essential hypertension 03/05/2009  . FATTY LIVER DISEASE 03/05/2009    Past Surgical History:  Procedure Laterality Date  . ABDOMINAL HYSTERECTOMY    . BOWEL RESECTION    . CESAREAN SECTION     x4  . COLONOSCOPY W/ POLYPECTOMY  04/2016  . KNEE SURGERY     Right knee scope 8'12  . LEFT HEART CATH AND CORONARY ANGIOGRAPHY N/A 02/13/2019   Procedure: LEFT HEART CATH AND CORONARY ANGIOGRAPHY;  Surgeon: Nelva Bush, MD;  Location: [facility];  Service: Cardiovascular;  Laterality: N/A;  . TOTAL KNEE ARTHROPLASTY Right 01/31/2013   Procedure: RIGHT TOTAL KNEE ARTHROPLASTY;  Surgeon: [name], MD;  Location: [facility];  Service: Orthopedics;  Laterality: Right;  . TOTAL KNEE ARTHROPLASTY Left 02/15/2018   Procedure: LEFT TOTAL KNEE ARTHROPLASTY;  Surgeon: Paralee Cancel, MD;  Location: [facility];  Service: Orthopedics;  Laterality: Left;  68     OB History   No obstetric history on file.     Family History  Problem Relation Age of Onset  . Lung cancer Mother   . Diabetes Father   . Colon cancer Neg Hx  Social History   Tobacco Use  . Smoking status: Former Smoker    Quit date: 01/25/2005    Years since quitting: 15.4  . Smokeless tobacco: Never Used  Vaping Use  . Vaping Use: Never used  Substance Use Topics  . Alcohol use: No  . Drug use: No    Home Medications Prior to Admission medications   Medication Sig Start Date End Date Taking? Authorizing Provider  EPINEPHrine 0.3 mg/0.3 mL IJ SOAJ injection Inject 0.3 mg into the muscle as needed for anaphylaxis. 06/21/20  Yes Breck Coons, MD  predniSONE (DELTASONE) 10 MG tablet Take 4 tablets (40 mg total) by  mouth daily. 06/21/20  Yes Breck Coons, MD  albuterol (PROVENTIL HFA;VENTOLIN HFA) 108 (90 BASE) MCG/ACT inhaler Inhale 2 puffs into the lungs every 6 (six) hours as needed for wheezing or shortness of breath.    [provider]  aspirin 81 MG chewable tablet Chew 81 mg by mouth daily.    [provider]  atorvastatin (LIPITOR) 10 MG tablet Take 10 mg by mouth at bedtime.    [provider]  BREO ELLIPTA 200-25 MCG/INH AEPB Inhale 1 puff into the lungs daily. 12/28/17   [provider]  busPIRone (BUSPAR) 15 MG tablet Take 15 mg by mouth 3 (three) times daily.    [provider]  carvedilol (COREG) 6.25 MG tablet Take 1 tablet (6.25 mg total) by mouth 2 (two) times daily with a meal. 02/14/19   Bonnell Public, MD  diazepam (VALIUM) 10 MG tablet Take 10 mg by mouth 4 (four) times daily.    [provider]  EPINEPHrine 0.3 mg/0.3 mL IJ SOAJ injection Inject 0.3 mLs (0.3 mg total) into the muscle as needed for anaphylaxis. 02/19/19   Tacy Learn, PA-C  famotidine (PEPCID) 20 MG tablet Take 1 tablet (20 mg total) by mouth 2 (two) times daily. 12/12/19   Palumbo, April, MD  fluticasone (FLONASE) 50 MCG/ACT nasal spray Place 2 sprays into both nostrils daily as needed for allergies.     [provider]  glipiZIDE (GLUCOTROL XL) 10 MG 24 hr tablet Take 1 tablet by mouth daily. 10/17/18   [provider]  lisinopril (PRINIVIL,ZESTRIL) 10 MG tablet Take 10 mg by mouth daily.    [provider]  methocarbamol (ROBAXIN) 500 MG tablet Take 1 tablet (500 mg total) by mouth every 6 (six) hours as needed for muscle spasms. Patient not taking: Reported on 02/08/2019 02/15/18   [name], PA-C  montelukast (SINGULAIR) 10 MG tablet Take 10 mg by mouth daily. 10/06/17   [provider]  omeprazole (PRILOSEC) 20 MG capsule Take 20 mg by mouth daily.    [provider]  OZEMPIC, 0.25 OR 0.5 MG/DOSE, 2 MG/1.5ML SOPN  1 mg by Subconjunctival route See admin instructions. On Tuesdays 10/31/18   [provider]  polyethylene glycol (MIRALAX / GLYCOLAX) 17 g packet Take 17 g by mouth daily.    [provider]  predniSONE (DELTASONE) 20 MG tablet 3 tabs po day one, then 2 po daily x 4 days 12/12/19   Palumbo, April, MD  QUEtiapine (SEROQUEL) 100 MG tablet Take 100 mg by mouth at bedtime. 10/27/17   [provider]  raloxifene (EVISTA) 60 MG tablet Take 60 mg by mouth daily. 02/14/19   [provider]  rOPINIRole (REQUIP) 1 MG tablet Take 1 mg by mouth at bedtime.    [provider]    Allergies  Banana, Latex, Lisinopril-hydrochlorothiazide, Onion, Penicillins, Tylenol [acetaminophen], Codeine, Hydrocodone, and Insect wax  Review of Systems   Review of Systems  Constitutional: Negative for chills and fever.  HENT: Positive for facial swelling, trouble swallowing and voice change. Negative for congestion and rhinorrhea.   Respiratory: Positive for shortness of breath and wheezing. Negative for cough.   Cardiovascular: Negative for chest pain and palpitations.  Gastrointestinal: Negative for diarrhea, nausea and vomiting.  Genitourinary: Negative for difficulty urinating and dysuria.  Musculoskeletal: Negative for arthralgias and back pain.  Skin: Positive for itching and rash. Negative for wound.  Neurological: Negative for light-headedness and headaches.    Physical Exam Updated Vital Signs BP (!) 184/128   Pulse 99   Temp (!) 97.5 F (36.4 C) (Oral)   Resp (!) 24   SpO2 100%   Physical Exam Vitals and nursing note reviewed. Exam conducted with a chaperone present.  Constitutional:      General: She is not in acute distress.    Appearance: Normal appearance.  HENT:     Head: Normocephalic and atraumatic.     Nose: No rhinorrhea.  Eyes:     General:        Right eye: No discharge.        Left eye: No discharge.     Conjunctiva/sclera: Conjunctivae  normal.  Cardiovascular:     Rate and Rhythm: Normal rate and regular rhythm.  Pulmonary:     Effort: Pulmonary effort is normal. No respiratory distress.     Breath sounds: No stridor. No wheezing, rhonchi or rales.  Chest:     Chest wall: No tenderness.  Abdominal:     General: Abdomen is flat. There is no distension.     Palpations: Abdomen is soft.  Musculoskeletal:        General: No tenderness or signs of injury.  Skin:    General: Skin is warm and dry.     Comments: Diffuse urticarial rash  Neurological:     General: No focal deficit present.     Mental Status: She is alert. Mental status is at baseline.     Motor: No weakness.  Psychiatric:        Mood and Affect: Mood normal.        Behavior: Behavior normal.     ED Results / Procedures / Treatments   Labs (all labs ordered are listed, but only abnormal results are displayed) Labs Reviewed  CBG MONITORING, ED - Abnormal; Notable for the following components:      Result Value   Glucose-Capillary 101 (*)    All other components within normal limits    EKG None  Radiology No results found.  Procedures .Critical Care E&M Performed by: Breck Coons, MD  Critical care provider statement:    Critical care time (minutes):  60   Critical care was necessary to treat or prevent imminent or life-threatening deterioration of the following conditions:  Circulatory failure (anaphylaxis)   Critical care was time spent personally by me on the following activities:  Development of treatment plan with patient or surrogate, evaluation of patient's response to treatment, examination of patient, obtaining history from patient or surrogate, ordering and performing treatments and interventions, re-evaluation of patient's condition and review of old charts   Care discussed with: admitting provider   After initial E/M assessment, critical care services were subsequently performed that were exclusive of separately billable  procedures or treatment.       Medications Ordered in ED  Medications  methylPREDNISolone sodium succinate (SOLU-MEDROL) 125 mg/2 mL injection 125 mg (125 mg Intravenous Given 06/21/20 2103)  famotidine (PEPCID) IVPB 20 mg premix (0 mg Intravenous Stopped 06/21/20 2315)    ED Course  I have reviewed the triage vital signs and the nursing notes.  Pertinent labs & imaging results that were available during my care of the patient were reviewed by me and considered in my medical decision making (see chart for details).    MDM Rules/Calculators/A&P                          Anaphylaxis to onions.  Had chest tightness wheezing nausea vomiting throat tightness and headache.  Given epinephrine to herself and then by EMS given Benadryl fluids and Zofran.  Patient has marked improvement in symptoms.  His chronic hypoxic respiratory failure on 3 L at baseline.  She is breathing comfortably on that now.  Vitals are stable upon arrival.  Solu-Medrol be added to her regimen of medications and she will be observed.  Patient was observed for over 3 hours.  Has marked improvement of symptoms since feeling back to baseline.  Steroids were given.  This patient is given prescription for more.  And they have been given a prescription for refills.  They feel comfortable discharge home.  They feel comfortable with use of epinephrine at home.  No be discharged.  Strict return precautions are discussed. Final Clinical Impression(s) / ED Diagnoses Final diagnoses:  Anaphylaxis, initial encounter    Rx / DC Orders ED Discharge Orders         Ordered    predniSONE (DELTASONE) 10 MG tablet  Daily        06/21/20 2354    EPINEPHrine 0.3 mg/0.3 mL IJ SOAJ injection  As needed        06/21/20 2354           Breck Coons, MD 06/23/20 [phone]

## 2020-06-21 NOTE — ED Notes (Signed)
Provided pt with additional blankets. Pt resting.  She continues to report tightness in throat is unchanged. Airway remains patent. Respirations even and unlabored.

## 2020-06-21 NOTE — ED Triage Notes (Signed)
Patient arrives complaining of anaphylaxis s/p onion ingestion. Patient known allergy to onions. Patient ate a sloppy joe that contained onion. Patient states she immediately threw up after ingestion. Given epi pen by husband. Medicated with 0.5 mg epi, 50 mg benedryl and solumedrol with improvement. Patient originally hypotensive on EMS arrival.

## 2020-10-06 ENCOUNTER — Other Ambulatory Visit (HOSPITAL_COMMUNITY): Payer: Medicare Other

## 2020-10-06 ENCOUNTER — Observation Stay (HOSPITAL_COMMUNITY): Payer: Medicare Other

## 2020-10-06 ENCOUNTER — Inpatient Hospital Stay (HOSPITAL_COMMUNITY)
Admission: EM | Admit: 2020-10-06 | Discharge: 2020-10-08 | DRG: 281 | Disposition: A | Discharge status: Home / Self Care | Payer: Medicare Other | Attending: Cardiology | Admitting: Cardiology

## 2020-10-06 ENCOUNTER — Emergency Department (HOSPITAL_COMMUNITY): Payer: Medicare Other

## 2020-10-06 ENCOUNTER — Other Ambulatory Visit: Payer: Self-pay

## 2020-10-06 DIAGNOSIS — Z91018 Allergy to other foods: Secondary | ICD-10-CM

## 2020-10-06 DIAGNOSIS — I152 Hypertension secondary to endocrine disorders: Secondary | ICD-10-CM | POA: Diagnosis present

## 2020-10-06 DIAGNOSIS — K219 Gastro-esophageal reflux disease without esophagitis: Secondary | ICD-10-CM | POA: Diagnosis present

## 2020-10-06 DIAGNOSIS — Z6841 Body Mass Index (BMI) 40.0 and over, adult: Secondary | ICD-10-CM

## 2020-10-06 DIAGNOSIS — I248 Other forms of acute ischemic heart disease: Secondary | ICD-10-CM

## 2020-10-06 DIAGNOSIS — Z87891 Personal history of nicotine dependence: Secondary | ICD-10-CM

## 2020-10-06 DIAGNOSIS — E119 Type 2 diabetes mellitus without complications: Secondary | ICD-10-CM | POA: Diagnosis present

## 2020-10-06 DIAGNOSIS — Z886 Allergy status to analgesic agent status: Secondary | ICD-10-CM

## 2020-10-06 DIAGNOSIS — I200 Unstable angina: Secondary | ICD-10-CM | POA: Diagnosis present

## 2020-10-06 DIAGNOSIS — Z888 Allergy status to other drugs, medicaments and biological substances status: Secondary | ICD-10-CM

## 2020-10-06 DIAGNOSIS — I10 Essential (primary) hypertension: Secondary | ICD-10-CM | POA: Diagnosis present

## 2020-10-06 DIAGNOSIS — Z7952 Long term (current) use of systemic steroids: Secondary | ICD-10-CM

## 2020-10-06 DIAGNOSIS — I214 Non-ST elevation (NSTEMI) myocardial infarction: Principal | ICD-10-CM

## 2020-10-06 DIAGNOSIS — E1159 Type 2 diabetes mellitus with other circulatory complications: Secondary | ICD-10-CM | POA: Diagnosis present

## 2020-10-06 DIAGNOSIS — Z9981 Dependence on supplemental oxygen: Secondary | ICD-10-CM

## 2020-10-06 DIAGNOSIS — I25110 Atherosclerotic heart disease of native coronary artery with unstable angina pectoris: Secondary | ICD-10-CM | POA: Diagnosis present

## 2020-10-06 DIAGNOSIS — Z885 Allergy status to narcotic agent status: Secondary | ICD-10-CM

## 2020-10-06 DIAGNOSIS — Z7982 Long term (current) use of aspirin: Secondary | ICD-10-CM

## 2020-10-06 DIAGNOSIS — Z20822 Contact with and (suspected) exposure to covid-19: Secondary | ICD-10-CM | POA: Diagnosis present

## 2020-10-06 DIAGNOSIS — Z880 Allergy status to penicillin: Secondary | ICD-10-CM

## 2020-10-06 DIAGNOSIS — Z7984 Long term (current) use of oral hypoglycemic drugs: Secondary | ICD-10-CM

## 2020-10-06 DIAGNOSIS — Z91040 Latex allergy status: Secondary | ICD-10-CM

## 2020-10-06 DIAGNOSIS — Z96653 Presence of artificial knee joint, bilateral: Secondary | ICD-10-CM | POA: Diagnosis present

## 2020-10-06 DIAGNOSIS — Z79899 Other long term (current) drug therapy: Secondary | ICD-10-CM

## 2020-10-06 DIAGNOSIS — G4733 Obstructive sleep apnea (adult) (pediatric): Secondary | ICD-10-CM | POA: Diagnosis present

## 2020-10-06 DIAGNOSIS — Z833 Family history of diabetes mellitus: Secondary | ICD-10-CM

## 2020-10-06 DIAGNOSIS — Z7951 Long term (current) use of inhaled steroids: Secondary | ICD-10-CM

## 2020-10-06 DIAGNOSIS — Z801 Family history of malignant neoplasm of trachea, bronchus and lung: Secondary | ICD-10-CM

## 2020-10-06 LAB — CBG MONITORING, ED: Glucose-Capillary: 92 mg/dL (ref 70–99)

## 2020-10-06 LAB — BRAIN NATRIURETIC PEPTIDE: B Natriuretic Peptide: 6.2 pg/mL (ref 0.0–100.0)

## 2020-10-06 LAB — TROPONIN I (HIGH SENSITIVITY)
Troponin I (High Sensitivity): 176 ng/L (ref <18)
Troponin I (High Sensitivity): 239 ng/L (ref <18)
Troponin I (High Sensitivity): 278 ng/L (ref <18)
Troponin I (High Sensitivity): 280 ng/L (ref <18)

## 2020-10-06 LAB — CBC WITH DIFFERENTIAL/PLATELET
Abs Immature Granulocytes: 0.01 10*3/uL (ref 0.00–0.07)
Basophils Absolute: 0 10*3/uL (ref 0.0–0.1)
Basophils Relative: 1 %
Eosinophils Absolute: 0.2 10*3/uL (ref 0.0–0.5)
Eosinophils Relative: 2 %
HCT: 42.8 % (ref 36.0–46.0)
Hemoglobin: 13.4 g/dL (ref 12.0–15.0)
Immature Granulocytes: 0 %
Lymphocytes Relative: 48 %
Lymphs Abs: 3.4 10*3/uL (ref 0.7–4.0)
MCH: 29.8 pg (ref 26.0–34.0)
MCHC: 31.3 g/dL (ref 30.0–36.0)
MCV: 95.3 fL (ref 80.0–100.0)
Monocytes Absolute: 0.4 10*3/uL (ref 0.1–1.0)
Monocytes Relative: 5 %
Neutro Abs: 3.2 10*3/uL (ref 1.7–7.7)
Neutrophils Relative %: 44 %
Platelets: 374 10*3/uL (ref 150–400)
RBC: 4.49 MIL/uL (ref 3.87–5.11)
RDW: 14.7 % (ref 11.5–15.5)
WBC: 7.2 10*3/uL (ref 4.0–10.5)
nRBC: 0 % (ref 0.0–0.2)

## 2020-10-06 LAB — ECHOCARDIOGRAM COMPLETE
Area-P 1/2: 3.89 cm2
Height: 59 in
S' Lateral: 2.8 cm
Weight: 3312 oz

## 2020-10-06 LAB — COMPREHENSIVE METABOLIC PANEL
ALT: 18 U/L (ref 0–44)
AST: 21 U/L (ref 15–41)
Albumin: 3.5 g/dL (ref 3.5–5.0)
Alkaline Phosphatase: 81 U/L (ref 38–126)
Anion gap: 9 (ref 5–15)
BUN: 7 mg/dL (ref 6–20)
CO2: 27 mmol/L (ref 22–32)
Calcium: 8.8 mg/dL — ABNORMAL LOW (ref 8.9–10.3)
Chloride: 104 mmol/L (ref 98–111)
Creatinine, Ser: 0.75 mg/dL (ref 0.44–1.00)
GFR, Estimated: >60 mL/min (ref >60)
Glucose, Bld: 81 mg/dL (ref 70–99)
Potassium: 3.5 mmol/L (ref 3.5–5.1)
Sodium: 140 mmol/L (ref 135–145)
Total Bilirubin: 0.4 mg/dL (ref 0.3–1.2)
Total Protein: 6.6 g/dL (ref 6.5–8.1)

## 2020-10-06 LAB — HIV ANTIBODY (ROUTINE TESTING W REFLEX): HIV Screen 4th Generation wRfx: NONREACTIVE

## 2020-10-06 LAB — LIPASE, BLOOD: Lipase: 34 U/L (ref 11–51)

## 2020-10-06 LAB — RESP PANEL BY RT-PCR (FLU A&B, COVID) ARPGX2
Influenza A by PCR: NEGATIVE
Influenza B by PCR: NEGATIVE
SARS Coronavirus 2 by RT PCR: NEGATIVE

## 2020-10-06 LAB — HEPARIN LEVEL (UNFRACTIONATED)
Heparin Unfractionated: 0.9 IU/mL — ABNORMAL HIGH (ref 0.30–0.70)
Heparin Unfractionated: 0.71 IU/mL — ABNORMAL HIGH (ref 0.30–0.70)

## 2020-10-06 LAB — GLUCOSE, CAPILLARY: Glucose-Capillary: 156 mg/dL — ABNORMAL HIGH (ref 70–99)

## 2020-10-06 MED ORDER — GLIPIZIDE ER 5 MG PO TB24
10.0000 mg | ORAL_TABLET | Freq: Every day | ORAL | Status: DC
  Administered 2020-10-07 – 2020-10-08 (×2): 10 mg via ORAL
  Filled 2020-10-06: qty 1
  Filled 2020-10-06: qty 2
  Filled 2020-10-06: qty 1
  Filled 2020-10-06: qty 2

## 2020-10-06 MED ORDER — ASPIRIN 81 MG PO CHEW
81.0000 mg | CHEWABLE_TABLET | Freq: Every day | ORAL | Status: DC
  Administered 2020-10-06 – 2020-10-07 (×2): 81 mg via ORAL
  Filled 2020-10-06 (×3): qty 1

## 2020-10-06 MED ORDER — LISINOPRIL 10 MG PO TABS: 10.0000 mg | ORAL_TABLET | Freq: Every day | ORAL | Status: DC

## 2020-10-06 MED ORDER — NITROGLYCERIN 0.4 MG SL SUBL: 0.4000 mg | SUBLINGUAL_TABLET | SUBLINGUAL | Status: DC | PRN

## 2020-10-06 MED ORDER — NITROGLYCERIN IN D5W 200-5 MCG/ML-% IV SOLN
0.0000 ug/min | INTRAVENOUS | Status: DC
  Administered 2020-10-06: 5 ug/min via INTRAVENOUS
  Administered 2020-10-06: 10 ug/min via INTRAVENOUS
  Filled 2020-10-06 (×2): qty 250

## 2020-10-06 MED ORDER — ROPINIROLE HCL 0.5 MG PO TABS
1.0000 mg | ORAL_TABLET | Freq: Every day | ORAL | Status: DC
  Administered 2020-10-06 – 2020-10-07 (×2): 1 mg via ORAL
  Filled 2020-10-06: qty 2
  Filled 2020-10-06: qty 1
  Filled 2020-10-06: qty 2

## 2020-10-06 MED ORDER — CARVEDILOL 6.25 MG PO TABS
6.2500 mg | ORAL_TABLET | Freq: Two times a day (BID) | ORAL | Status: DC
  Administered 2020-10-07 – 2020-10-08 (×3): 6.25 mg via ORAL
  Filled 2020-10-06 (×3): qty 1

## 2020-10-06 MED ORDER — FLUTICASONE FUROATE-VILANTEROL 200-25 MCG/INH IN AEPB
1.0000 | INHALATION_SPRAY | Freq: Every day | RESPIRATORY_TRACT | Status: DC
  Administered 2020-10-07 – 2020-10-08 (×2): 1 via RESPIRATORY_TRACT
  Filled 2020-10-06: qty 28

## 2020-10-06 MED ORDER — FAMOTIDINE 20 MG PO TABS
20.0000 mg | ORAL_TABLET | Freq: Two times a day (BID) | ORAL | Status: DC
  Administered 2020-10-06 – 2020-10-07 (×4): 20 mg via ORAL
  Filled 2020-10-06 (×4): qty 1

## 2020-10-06 MED ORDER — SODIUM CHLORIDE 0.9% FLUSH
3.0000 mL | Freq: Two times a day (BID) | INTRAVENOUS | Status: DC
  Administered 2020-10-06 – 2020-10-07 (×2): 3 mL via INTRAVENOUS

## 2020-10-06 MED ORDER — HEPARIN (PORCINE) 25000 UT/250ML-% IV SOLN
900.0000 [IU]/h | INTRAVENOUS | Status: DC
  Administered 2020-10-06: 1100 [IU]/h via INTRAVENOUS
  Administered 2020-10-07: 850 [IU]/h via INTRAVENOUS
  Filled 2020-10-06 (×4): qty 250

## 2020-10-06 MED ORDER — MONTELUKAST SODIUM 10 MG PO TABS
10.0000 mg | ORAL_TABLET | Freq: Every day | ORAL | Status: DC
  Administered 2020-10-06 – 2020-10-07 (×2): 10 mg via ORAL
  Filled 2020-10-06 (×3): qty 1

## 2020-10-06 MED ORDER — ALBUTEROL SULFATE HFA 108 (90 BASE) MCG/ACT IN AERS: 2.0000 | INHALATION_SPRAY | Freq: Four times a day (QID) | RESPIRATORY_TRACT | Status: DC | PRN

## 2020-10-06 MED ORDER — EPINEPHRINE PF 1 MG/ML IJ SOLN: 0.3000 mg | INTRAMUSCULAR | Status: DC | PRN

## 2020-10-06 MED ORDER — ASPIRIN 81 MG PO CHEW
324.0000 mg | CHEWABLE_TABLET | Freq: Once | ORAL | Status: AC
  Administered 2020-10-06: 324 mg via ORAL
  Filled 2020-10-06: qty 4

## 2020-10-06 MED ORDER — EPINEPHRINE 0.3 MG/0.3ML IJ SOAJ: 0.3000 mg | INTRAMUSCULAR | Status: DC | PRN

## 2020-10-06 MED ORDER — ATORVASTATIN CALCIUM 10 MG PO TABS
10.0000 mg | ORAL_TABLET | Freq: Every day | ORAL | Status: DC
  Administered 2020-10-06 – 2020-10-07 (×2): 10 mg via ORAL
  Filled 2020-10-06 (×2): qty 1

## 2020-10-06 MED ORDER — HEPARIN BOLUS VIA INFUSION
4000.0000 [IU] | Freq: Once | INTRAVENOUS | Status: AC
  Administered 2020-10-06: 4000 [IU] via INTRAVENOUS
  Filled 2020-10-06: qty 4000

## 2020-10-06 MED ORDER — ONDANSETRON HCL 4 MG/2ML IJ SOLN
4.0000 mg | Freq: Four times a day (QID) | INTRAMUSCULAR | Status: DC | PRN
  Administered 2020-10-06 – 2020-10-07 (×4): 4 mg via INTRAVENOUS
  Filled 2020-10-06 (×3): qty 2

## 2020-10-06 MED ORDER — METHOCARBAMOL 500 MG PO TABS: 500.0000 mg | ORAL_TABLET | Freq: Four times a day (QID) | ORAL | Status: DC | PRN

## 2020-10-06 MED ORDER — ASPIRIN 81 MG PO CHEW: 324.0000 mg | CHEWABLE_TABLET | Freq: Once | ORAL | Status: DC

## 2020-10-06 MED ORDER — FLUTICASONE PROPIONATE 50 MCG/ACT NA SUSP: 2.0000 | Freq: Every day | NASAL | Status: DC | PRN

## 2020-10-06 MED ORDER — BUSPIRONE HCL 5 MG PO TABS
15.0000 mg | ORAL_TABLET | Freq: Three times a day (TID) | ORAL | Status: DC
  Administered 2020-10-06 – 2020-10-07 (×6): 15 mg via ORAL
  Filled 2020-10-06 (×2): qty 3
  Filled 2020-10-06: qty 2
  Filled 2020-10-06 (×2): qty 3
  Filled 2020-10-06: qty 2

## 2020-10-06 MED ORDER — ALBUTEROL SULFATE (2.5 MG/3ML) 0.083% IN NEBU: 2.5000 mg | INHALATION_SOLUTION | Freq: Four times a day (QID) | RESPIRATORY_TRACT | Status: DC | PRN

## 2020-10-06 MED ORDER — QUETIAPINE FUMARATE 50 MG PO TABS
100.0000 mg | ORAL_TABLET | Freq: Every day | ORAL | Status: DC
  Administered 2020-10-06 – 2020-10-07 (×2): 100 mg via ORAL
  Filled 2020-10-06 (×2): qty 2
  Filled 2020-10-06: qty 1

## 2020-10-06 NOTE — Progress Notes (Signed)
ANTICOAGULATION CONSULT NOTE - Initial Consult  Pharmacy Consult for Heparin Indication: chest pain/ACS  Allergies  Allergen Reactions   Banana Shortness Of Breath and Swelling   Latex Shortness Of Breath   Lisinopril-Hydrochlorothiazide Swelling    Swelling (had epi by ems and required ED visit)   Onion Shortness Of Breath and Swelling   Penicillins Anaphylaxis, Swelling and Other (See Comments)    Has patient had a PCN reaction causing immediate rash, facial/tongue/throat swelling, SOB or lightheadedness with hypotension: Yes Has patient had a PCN reaction causing severe rash involving mucus membranes or skin necrosis: No Has patient had a PCN reaction that required hospitalization: Yes - inpatient Has patient had a PCN reaction occurring within the last 10 years: No If all of the above answers are "NO", then may proceed with Cephalosporin use.    Tylenol [Acetaminophen] Other (See Comments)    Throat swelling    Codeine Swelling   Hydrocodone Hives and Swelling   Insect Wax     Any Insect that bites    Patient Measurements: Height: 4\' 11"  (149.9 cm) Weight: 93.9 kg (207 lb) IBW/kg (Calculated) : 43.2 Heparin Dosing Weight: 65 kg  Vital Signs: Temp: 98.7 F (37.1 C) (07/03 0339) Temp Source: Oral (07/03 0339) BP: 109/78 (07/03 1246) Pulse Rate: 91 (07/03 1246)  Labs: Recent Labs    10/06/20 0114 10/06/20 0356 10/06/20 0815 10/06/20 1015 10/06/20 1200  HGB 13.4  --   --   --   --   HCT 42.8  --   --   --   --   PLT 374  --   --   --   --   HEPARINUNFRC  --   --   --   --  0.90*  CREATININE 0.75  --   --   --   --   TROPONINIHS 176* 239* 278* 280*  --    Estimated Creatinine Clearance: 78.7 mL/min (by C-G formula based on SCr of 0.75 mg/dL).   Medical History: Past Medical History:  Diagnosis Date   Anxiety    Arthritis    osteoarthritis-knees   Asthma    Breast fibroadenoma, left    Depression    Diabetes mellitus without complication ([place])     Disease of eye characterized by increased eye pressure    no eye drops used-some increased pressure   GERD (gastroesophageal reflux disease)    Headache(784.0)    occ. with allergies/sinus issues   History of colon polyps 04/2016   History of MRSA infection 2007   Hypertension    no problems since weight loss -'09   Multiple food allergies    Latex allergy-respiratory   Obesity    Tendonitis    Vitamin D deficiency    history of    Medications:  No current facility-administered medications on file prior to encounter.   Current Outpatient Medications on File Prior to Encounter  Medication Sig Dispense Refill   aspirin 81 MG chewable tablet Chew 81 mg by mouth daily.     atorvastatin (LIPITOR) 10 MG tablet Take 10 mg by mouth at bedtime.     busPIRone (BUSPAR) 15 MG tablet Take 15 mg by mouth 2 (two) times daily.     calcium carbonate (OS-CAL - DOSED IN MG OF ELEMENTAL CALCIUM) 1250 (500 Ca) MG tablet Take 1 tablet by mouth daily with breakfast.     carvedilol (COREG) 6.25 MG tablet Take 1 tablet (6.25 mg total) by mouth 2 (two) times daily  with a meal. 60 tablet 0   cholecalciferol (VITAMIN D3) 25 MCG (1000 UNIT) tablet Take 1,000 Units by mouth daily.     cyclobenzaprine (FLEXERIL) 10 MG tablet Take 10 mg by mouth at bedtime as needed for muscle spasms.     desvenlafaxine (PRISTIQ) 100 MG 24 hr tablet Take 100 mg by mouth daily.     diazepam (VALIUM) 10 MG tablet Take 10 mg by mouth 4 (four) times daily.     EPINEPHrine 0.3 mg/0.3 mL IJ SOAJ injection Inject 0.3 mg into the muscle as needed for anaphylaxis. 2 each 2   famotidine (PEPCID) 20 MG tablet Take 1 tablet (20 mg total) by mouth 2 (two) times daily. 10 tablet 0   fluticasone (FLONASE) 50 MCG/ACT nasal spray Place 2 sprays into both nostrils daily as needed for allergies.      furosemide (LASIX) 20 MG tablet Take 20 mg by mouth daily.     glipiZIDE (GLUCOTROL XL) 10 MG 24 hr tablet Take 10 mg by mouth daily.     montelukast  (SINGULAIR) 10 MG tablet Take 10 mg by mouth daily.     omeprazole (PRILOSEC) 20 MG capsule Take 20 mg by mouth daily.     OXYGEN Inhale 3 L into the lungs continuous.     OZEMPIC, 0.25 OR 0.5 MG/DOSE, 2 MG/1.5ML SOPN 1 mg by Subconjunctival route See admin instructions. On Tuesdays     polyethylene glycol (MIRALAX / GLYCOLAX) 17 g packet Take 17 g by mouth daily.     QUEtiapine (SEROQUEL) 200 MG tablet Take 200 mg by mouth at bedtime.     raloxifene (EVISTA) 60 MG tablet Take 60 mg by mouth daily.     rOPINIRole (REQUIP) 2 MG tablet Take 2 mg by mouth at bedtime.     albuterol (PROVENTIL HFA;VENTOLIN HFA) 108 (90 BASE) MCG/ACT inhaler Inhale 2 puffs into the lungs every 6 (six) hours as needed for wheezing or shortness of breath. (Patient not taking: Reported on 10/06/2020)     BREO ELLIPTA 200-25 MCG/INH AEPB Inhale 1 puff into the lungs daily. (Patient not taking: Reported on 10/06/2020)  3   EPINEPHrine 0.3 mg/0.3 mL IJ SOAJ injection Inject 0.3 mLs (0.3 mg total) into the muscle as needed for anaphylaxis. (Patient not taking: No sig reported) 1 each 0   methocarbamol (ROBAXIN) 500 MG tablet Take 1 tablet (500 mg total) by mouth every 6 (six) hours as needed for muscle spasms. (Patient not taking: No sig reported) 40 tablet 0   QUEtiapine (SEROQUEL) 100 MG tablet Take 100 mg by mouth at bedtime. (Patient not taking: Reported on 10/06/2020)  1   rOPINIRole (REQUIP) 1 MG tablet Take 1 mg by mouth at bedtime. (Patient not taking: Reported on 10/06/2020)     [DISCONTINUED] lisinopril (PRINIVIL,ZESTRIL) 10 MG tablet Take 10 mg by mouth daily.     [DISCONTINUED] predniSONE (DELTASONE) 10 MG tablet Take 4 tablets (40 mg total) by mouth daily. (Patient not taking: No sig reported) 15 tablet 0   [DISCONTINUED] predniSONE (DELTASONE) 20 MG tablet 3 tabs po day one, then 2 po daily x 4 days (Patient not taking: No sig reported) 11 tablet 0   [DISCONTINUED] Semaglutide, 1 MG/DOSE, (OZEMPIC, 1 MG/DOSE,) 4 MG/3ML  SOPN Inject 1 mg into the skin once a week. (Patient not taking: Reported on 10/06/2020)      Assessment: 55 y.o. female with chest pain for heparin.  Heparin level resulting supratherapeutic at 0.9 IU/mL. H/H and platelets  wnl.  Goal of Therapy:  Heparin level 0.3-0.7 units/ml Monitor platelets by anticoagulation protocol: Yes   Plan:  Reduce heparin infusion to 950 units/hr Check heparin level in 8 hours.  Monitor platelets, CBC, and for s/s of bleeding  Lorelei Pont, PharmD, BCPS 10/06/2020 1:12 PM ED Clinical Pharmacist -  [phone]

## 2020-10-06 NOTE — Progress Notes (Addendum)
Dr. Percival Spanish reviewed chart and agrees with plan for cardiac cath this admission. Per Dr. Rosezella Florida request, pre-cath orders written and patient placed on add-on board for Tuesday for left heart cath and possible PCI - he will consent patient, his note forthcoming. He initially requested we put in rx for her headache but patient prefers to try and sleep it off (nurse states that's what she usually does for a HA).

## 2020-10-06 NOTE — ED Triage Notes (Signed)
Pt c/o chest pain, 'feels like I have an elephant sitting on my chest ongoing for 6 months worsening today along with SOB, headache, decreased intake, NV.  Informed she has fluid build up around heart and lungs. Chronic 3L O2 for 3 years. Denies fevers/chills.

## 2020-10-06 NOTE — Progress Notes (Signed)
Progress Note  Patient Name: [name] Date of Encounter: 10/06/2020  Primary Cardiologist:   Jenne Campus, MD   Subjective   The patient was seen earlier today for chest pain.  Now complaining of a headache.   Chest pain is still 4/10  Inpatient Medications    Scheduled Meds:  aspirin  81 mg Oral Daily   atorvastatin  10 mg Oral QHS   busPIRone  15 mg Oral TID   carvedilol  6.25 mg Oral BID WC   famotidine  20 mg Oral BID   fluticasone furoate-vilanterol  1 puff Inhalation Daily   glipiZIDE  10 mg Oral Q breakfast   montelukast  10 mg Oral Daily   QUEtiapine  100 mg Oral QHS   rOPINIRole  1 mg Oral QHS   Continuous Infusions:  heparin 1,100 Units/hr (10/06/20 0444)   nitroGLYCERIN 10 mcg/min (10/06/20 0738)   PRN Meds: albuterol, EPINEPHrine, fluticasone, methocarbamol, nitroGLYCERIN, ondansetron (ZOFRAN) IV   Vital Signs    Vitals:   10/06/20 1236 10/06/20 1239 10/06/20 1242 10/06/20 1246  BP: 115/84 105/87 113/80 109/78  Pulse: 88 88 89 91  Resp: 17 17 16 19   Temp:      TempSrc:      SpO2: 100% 99% 100% 100%  Weight:      Height:       No intake or output data in the 24 hours ending 10/06/20 1257 Filed Weights   10/06/20 0101  Weight: 93.9 kg     ECG    NSR, rate 84 no acute ST T wave changes - Personally Reviewed  Physical Exam   GEN: No acute distress.   Neck: No  JVD Cardiac: RRR, no murmurs, rubs, or gallops.  Respiratory: Clear  to auscultation bilaterally. GI: Soft, nontender, non-distended  MS: No  edema; No deformity. Neuro:  Nonfocal  Psych: Normal affect   Labs    Chemistry Recent Labs  Lab 10/06/20 0114  NA 140  K 3.5  CL 104  CO2 27  GLUCOSE 81  BUN 7  CREATININE 0.75  CALCIUM 8.8*  PROT 6.6  ALBUMIN 3.5  AST 21  ALT 18  ALKPHOS 81  BILITOT 0.4  GFRNONAA >60  ANIONGAP 9     Hematology Recent Labs  Lab 10/06/20 0114  WBC 7.2  RBC 4.49  HGB 13.4  HCT 42.8  MCV 95.3  MCH 29.8  MCHC 31.3  RDW  14.7  PLT 374    Cardiac EnzymesNo results for input(s): TROPONINI in the last 168 hours. No results for input(s): TROPIPOC in the last 168 hours.   BNP Recent Labs  Lab 10/06/20 0114  BNP 6.2     DDimer No results for input(s): DDIMER in the last 168 hours.   Radiology    DG Chest 2 View  Result Date: 10/06/2020 CLINICAL DATA:  Chest pain for 6 months. Shortness of breath and headache. EXAM: CHEST - 2 VIEW COMPARISON:  02/19/2019 FINDINGS: Heart size and pulmonary vascularity are normal. Shallow inspiration with linear atelectasis in the lung bases. No pleural effusions. No pneumothorax. Mediastinal contours appear intact. Thoracolumbar scoliosis and degenerative changes. IMPRESSION: Shallow inspiration with atelectasis in the lung bases. Electronically Signed   By: Lucienne Capers M.D.   On: 10/06/2020 03:24    Cardiac Studies   NA  Patient Profile     55 y.o. female with chest pain who is being seen 10/06/2020 for the evaluation of NSTEMI.    Assessment & Plan  Chest pain:  Cath on Tuesday.  Continue IV NTG and heparin  Headache:  Related to NTG.     Other (See note from earlier today. )  For questions or updates, please contact [facility] Please consult [email or web address] for contact info under Cardiology/STEMI.   Signed, Minus Breeding, MD  10/06/2020, 12:57 PM

## 2020-10-06 NOTE — Progress Notes (Signed)
  Echocardiogram 2D Echocardiogram has been performed.  [name] 10/06/2020, 4:25 PM

## 2020-10-06 NOTE — ED Provider Notes (Signed)
[facility] EMERGENCY DEPARTMENT Provider Note   CSN: [id number] Arrival date & time: 10/06/20  0038     History Chief Complaint  Patient presents with   Chest Pain   Shortness of Breath    [name] is a 55 y.o. female.  Patient presents to the emergency department for evaluation of chest pain.  Patient reports that she has been having ongoing issues with chest and abdominal pain for approximately 6 months.  Her doctor did an outpatient work-up including ultrasound and MRI that did not show any abnormalities.  Today, however, she has had a change, now having heaviness in the center of the chest.  This is different than what she has been experiencing over the last 6 months.  She describes it as an "elephant sitting on her chest".  No shortness of breath, nausea or diaphoresis.      Past Medical History:  Diagnosis Date   Anxiety    Arthritis    osteoarthritis-knees   Asthma    Breast fibroadenoma, left    Depression    Diabetes mellitus without complication ([place])    Disease of eye characterized by increased eye pressure    no eye drops used-some increased pressure   GERD (gastroesophageal reflux disease)    Headache(784.0)    occ. with allergies/sinus issues   History of colon polyps 04/2016   History of MRSA infection 2007   Hypertension    no problems since weight loss -'09   Multiple food allergies    Latex allergy-respiratory   Obesity    Tendonitis    Vitamin D deficiency    history of    Patient Active Problem List   Diagnosis Date Noted   Unstable angina ([place])    Abnormal stress test    Chest pain 02/09/2019   Hypoglycemia 02/09/2019   Syncope 02/09/2019   Dyspnea on exertion 09/22/2018   PFO (patent foramen ovale) 09/22/2018   Type 2 diabetes mellitus without complication, without long-term current use of insulin ([place]) 09/22/2018   S/P left TKA 02/15/2018   Insomnia 02/06/2013   Anxiety and depression 02/06/2013   Unspecified  constipation 02/06/2013   GERD (gastroesophageal reflux disease) 02/06/2013   Anemia, iron deficiency 02/06/2013   Expected blood loss anemia 02/01/2013   Obese 02/01/2013   S/P right TKA 01/31/2013   Headache(784.0) 11/28/2012   Stroke-like symptoms 11/26/2012   CHEST PAIN UNSPECIFIED 03/06/2009   DEPRESSION 03/05/2009   Essential hypertension 03/05/2009   FATTY LIVER DISEASE 03/05/2009    Past Surgical History:  Procedure Laterality Date   ABDOMINAL HYSTERECTOMY     BOWEL RESECTION     CESAREAN SECTION     x4   COLONOSCOPY W/ POLYPECTOMY  04/2016   KNEE SURGERY     Right knee scope 8'12   LEFT HEART CATH AND CORONARY ANGIOGRAPHY N/A 02/13/2019   Procedure: LEFT HEART CATH AND CORONARY ANGIOGRAPHY;  Surgeon: Nelva Bush, MD;  Location: [facility];  Service: Cardiovascular;  Laterality: N/A;   TOTAL KNEE ARTHROPLASTY Right 01/31/2013   Procedure: RIGHT TOTAL KNEE ARTHROPLASTY;  Surgeon: [name], MD;  Location: [facility];  Service: Orthopedics;  Laterality: Right;   TOTAL KNEE ARTHROPLASTY Left 02/15/2018   Procedure: LEFT TOTAL KNEE ARTHROPLASTY;  Surgeon: Paralee Cancel, MD;  Location: [facility];  Service: Orthopedics;  Laterality: Left;  68     OB History   No obstetric history on file.     Family History  Problem Relation Age  of Onset   Lung cancer Mother    Diabetes Father    Colon cancer Neg Hx     Social History   Tobacco Use   Smoking status: Former    Pack years: 0.00    Types: Cigarettes    Quit date: 01/25/2005    Years since quitting: 15.7   Smokeless tobacco: Never  Vaping Use   Vaping Use: Never used  Substance Use Topics   Alcohol use: No   Drug use: No    Home Medications Prior to Admission medications   Medication Sig Start Date End Date Taking? Authorizing Provider  albuterol (PROVENTIL HFA;VENTOLIN HFA) 108 (90 BASE) MCG/ACT inhaler Inhale 2 puffs into the lungs every 6 (six) hours as needed for wheezing or shortness of  breath.    [provider]  aspirin 81 MG chewable tablet Chew 81 mg by mouth daily.    [provider]  atorvastatin (LIPITOR) 10 MG tablet Take 10 mg by mouth at bedtime.    [provider]  BREO ELLIPTA 200-25 MCG/INH AEPB Inhale 1 puff into the lungs daily. 12/28/17   [provider]  busPIRone (BUSPAR) 15 MG tablet Take 15 mg by mouth 3 (three) times daily.    [provider]  carvedilol (COREG) 6.25 MG tablet Take 1 tablet (6.25 mg total) by mouth 2 (two) times daily with a meal. 02/14/19   Bonnell Public, MD  diazepam (VALIUM) 10 MG tablet Take 10 mg by mouth 4 (four) times daily.    [provider]  EPINEPHrine 0.3 mg/0.3 mL IJ SOAJ injection Inject 0.3 mLs (0.3 mg total) into the muscle as needed for anaphylaxis. 02/19/19   Tacy Learn, PA-C  EPINEPHrine 0.3 mg/0.3 mL IJ SOAJ injection Inject 0.3 mg into the muscle as needed for anaphylaxis. 06/21/20   Breck Coons, MD  famotidine (PEPCID) 20 MG tablet Take 1 tablet (20 mg total) by mouth 2 (two) times daily. 12/12/19   Palumbo, April, MD  fluticasone (FLONASE) 50 MCG/ACT nasal spray Place 2 sprays into both nostrils daily as needed for allergies.     [provider]  glipiZIDE (GLUCOTROL XL) 10 MG 24 hr tablet Take 1 tablet by mouth daily. 10/17/18   [provider]  lisinopril (PRINIVIL,ZESTRIL) 10 MG tablet Take 10 mg by mouth daily.    [provider]  methocarbamol (ROBAXIN) 500 MG tablet Take 1 tablet (500 mg total) by mouth every 6 (six) hours as needed for muscle spasms. Patient not taking: Reported on 02/08/2019 02/15/18   [name], PA-C  montelukast (SINGULAIR) 10 MG tablet Take 10 mg by mouth daily. 10/06/17   [provider]  omeprazole (PRILOSEC) 20 MG capsule Take 20 mg by mouth daily.    [provider]  OZEMPIC, 0.25 OR 0.5 MG/DOSE, 2 MG/1.5ML SOPN 1 mg by Subconjunctival route See admin instructions. On Tuesdays  10/31/18   [provider]  polyethylene glycol (MIRALAX / GLYCOLAX) 17 g packet Take 17 g by mouth daily.    [provider]  predniSONE (DELTASONE) 10 MG tablet Take 4 tablets (40 mg total) by mouth daily. 06/21/20   Breck Coons, MD  predniSONE (DELTASONE) 20 MG tablet 3 tabs po day one, then 2 po daily x 4 days 12/12/19   Randal Buba, April, MD  QUEtiapine (SEROQUEL) 100 MG tablet Take 100 mg by mouth at bedtime. 10/27/17   [provider]  raloxifene (EVISTA) 60 MG tablet Take 60 mg  by mouth daily. 02/14/19   [provider]  rOPINIRole (REQUIP) 1 MG tablet Take 1 mg by mouth at bedtime.    [provider]    Allergies    Banana, Latex, Lisinopril-hydrochlorothiazide, Onion, Penicillins, Tylenol [acetaminophen], Codeine, Hydrocodone, and Insect wax  Review of Systems   Review of Systems  Cardiovascular:  Positive for chest pain.  All other systems reviewed and are negative.  Physical Exam Updated Vital Signs BP (!) 120/102 (BP Location: Left Arm)   Pulse 90   Temp 98.7 F (37.1 C) (Oral)   Resp 17   Ht 4\' 11"  (1.499 m)   Wt 93.9 kg   SpO2 100%   BMI 41.81 kg/m   Physical Exam Vitals and nursing note reviewed.  Constitutional:      General: She is not in acute distress.    Appearance: Normal appearance. She is well-developed.  HENT:     Head: Normocephalic and atraumatic.     Right Ear: Hearing normal.     Left Ear: Hearing normal.     Nose: Nose normal.  Eyes:     Conjunctiva/sclera: Conjunctivae normal.     Pupils: Pupils are equal, round, and reactive to light.  Cardiovascular:     Rate and Rhythm: Regular rhythm.     Heart sounds: S1 normal and S2 normal. No murmur heard.   No friction rub. No gallop.  Pulmonary:     Effort: Pulmonary effort is normal. No respiratory distress.     Breath sounds: Normal breath sounds.  Chest:     Chest wall: No tenderness.  Abdominal:     General: Bowel sounds are normal.     Palpations:  Abdomen is soft.     Tenderness: There is no abdominal tenderness. There is no guarding or rebound. Negative signs include Murphy's sign and McBurney's sign.     Hernia: No hernia is present.  Musculoskeletal:        General: Normal range of motion.     Cervical back: Normal range of motion and neck supple.  Skin:    General: Skin is warm and dry.     Findings: No rash.  Neurological:     Mental Status: She is alert and oriented to person, place, and time.     GCS: GCS eye subscore is 4. GCS verbal subscore is 5. GCS motor subscore is 6.     Cranial Nerves: No cranial nerve deficit.     Sensory: No sensory deficit.     Coordination: Coordination normal.  Psychiatric:        Speech: Speech normal.        Behavior: Behavior normal.        Thought Content: Thought content normal.    ED Results / Procedures / Treatments   Labs (all labs ordered are listed, but only abnormal results are displayed) Labs Reviewed  COMPREHENSIVE METABOLIC PANEL - Abnormal; Notable for the following components:      Result Value   Calcium 8.8 (*)    All other components within normal limits  TROPONIN I (HIGH SENSITIVITY) - Abnormal; Notable for the following components:   Troponin I (High Sensitivity) 176 (*)    All other components within normal limits  RESP PANEL BY RT-PCR (FLU A&B, COVID) ARPGX2  CBC WITH DIFFERENTIAL/PLATELET  LIPASE, BLOOD  BRAIN NATRIURETIC PEPTIDE  TROPONIN I (HIGH SENSITIVITY)    EKG EKG Interpretation  Date/Time:  Sunday October 06 2020 00:43:43 EDT Ventricular Rate:  97 PR Interval:  144 QRS Duration: 98 QT Interval:  384 QTC Calculation: 487 R Axis:   -33 Text Interpretation: Normal sinus rhythm Left axis deviation Cannot rule out Anterior infarct , age undetermined Abnormal ECG Confirmed by Orpah Greek [phone]) on 10/06/2020 3:37:00 AM  Radiology DG Chest 2 View  Result Date: 10/06/2020 CLINICAL DATA:  Chest pain for 6 months. Shortness of breath and  headache. EXAM: CHEST - 2 VIEW COMPARISON:  02/19/2019 FINDINGS: Heart size and pulmonary vascularity are normal. Shallow inspiration with linear atelectasis in the lung bases. No pleural effusions. No pneumothorax. Mediastinal contours appear intact. Thoracolumbar scoliosis and degenerative changes. IMPRESSION: Shallow inspiration with atelectasis in the lung bases. Electronically Signed   By: Lucienne Capers M.D.   On: 10/06/2020 03:24    Procedures Procedures   Medications Ordered in ED Medications  nitroGLYCERIN 50 mg in dextrose 5 % 250 mL (0.2 mg/mL) infusion (has no administration in time range)    ED Course  I have reviewed the triage vital signs and the nursing notes.  Pertinent labs & imaging results that were available during my care of the patient were reviewed by me and considered in my medical decision making (see chart for details).    MDM Rules/Calculators/A&P                          Presents to the emergency department for evaluation of chest pain.  Patient has been experiencing abdominal and chest pain on and off for months.  She had a different chest pain develop in the last 24 hours where she has now had a continuous heaviness in the chest described as an elephant sitting on her chest.  She has had some shortness of breath.  Reviewing her records reveals a history of nonischemic cardiomyopathy.  Chest x-ray does not show any overt pulmonary edema.  BNP is not elevated.  Doubt congestive heart failure exacerbation at this time.  She does have an elevated troponin concerning for NSTEMI/acute coronary syndrome.  Reviewing her records, however, reveals that she did not have any obstructive blockages in 2020 when she had a heart catheterization.  Discussed with cardiology.  We will initiate nitro, heparin and cardiology will see patient for admission.  CRITICAL CARE Performed by: Orpah Greek   Total critical care time: 35 minutes  Critical care time was  exclusive of separately billable procedures and treating other patients.  Critical care was necessary to treat or prevent imminent or life-threatening deterioration.  Critical care was time spent personally by me on the following activities: development of treatment plan with patient and/or surrogate as well as nursing, discussions with consultants, evaluation of patient's response to treatment, examination of patient, obtaining history from patient or surrogate, ordering and performing treatments and interventions, ordering and review of laboratory studies, ordering and review of radiographic studies, pulse oximetry and re-evaluation of patient's condition.  Final Clinical Impression(s) / ED Diagnoses Final diagnoses:  NSTEMI (non-ST elevated myocardial infarction) [facility])    Rx / DC Orders ED Discharge Orders     None        Arma Reining, Gwenyth Allegra, MD 10/06/20 [phone]

## 2020-10-06 NOTE — ED Notes (Signed)
The pt has multiple complaints  Her main concern currently is  A headache possibility from the  Nitro drip  She has a daughter that is a Marine scientist and works at [employer]  That the pt talks  To and gets suggestions.  She currently wants toradol for a headache and phenergan for nausea in case she gets nauseated  Per her daughter.  She also had some lt and rt arm numbness earlier that is getting better her pain is  4/10  A and o x4 she remains on  3 liters of nasal o2   We have discussed a pure wick for  future

## 2020-10-06 NOTE — ED Provider Notes (Signed)
Emergency Medicine Provider Triage Evaluation Note  [name] , a 55 y.o. female  was evaluated in triage.  Pt complains of central chest pain with associated shortness of breath onset 6 months ago.  Patient reports that this has been ongoing for her.  She wears oxygen at home.  3 L via nasal cannula.  She states that today the chest pain became worse and became more crushing pressure.  She additionally had nausea, dry heaves, worsening shortness of breath and bilateral paresthesias of both upper extremities.  Denies syncope or near syncope.  Denies swelling of her legs today but reports they were somewhat swollen yesterday.  Reports compliance with medications.  Review of Systems  Positive: Pain, shortness of breath, nausea, vomiting Negative: Syncope, near syncope  Physical Exam  BP (!) 132/98 (BP Location: Left Arm)   Pulse 97   Temp 98.6 F (37 C) (Oral)   Resp 19   Ht 4\' 11"  (1.499 m)   Wt 93.9 kg   SpO2 100%   BMI 41.81 kg/m  Gen:   Awake, no distress   Resp:  Normal effort  MSK:   Moves extremities without difficulty  Other:  No peripheral edema  Medical Decision Making  Medically screening exam initiated at 1:05 AM.  Appropriate orders placed.  Dylan Ruotolo was informed that the remainder of the evaluation will be completed by another provider, this initial triage assessment does not replace that evaluation, and the importance of remaining in the ED until their evaluation is complete.  Chest pain, SOB, N/V - work-up pending.   Chaun Uemura, Gwenlyn Perking 10/06/20 0112    Orpah Greek, MD 10/06/20 [phone]

## 2020-10-06 NOTE — H&P (Signed)
Cardiology Admission History and Physical:   Patient ID: [name] MRN: [id number]; DOB: 1965/11/21   Admission date: 10/06/2020  PCP:  Bernerd Limbo, MD   [facility] Cardiologist:  Jenne Campus, MD   {   Chief Complaint:  Chest pain and elevated trops  Patient Profile:   [name] is a 55 y.o. female with chest pain who is being seen 10/06/2020 for the evaluation of NSTEMI.  History of Present Illness:  [name] , a 55 y.o. female  was evaluated for NSTEMI.  Pt complains of lower chest and abdominal pain with associated shortness of breath onset 6 months ago. She states that since yesterday, she has been having chest heaviness that is different from her preivous pain. Denies any palpitations of LOC. Patient reports that this has been ongoing for her. States as "elephant sitting on chest" . She wears oxygen at home.  3 L via nasal cannula.   She additionally had nausea, dry heaves, worsening shortness of breath and bilateral paresthesias of both upper extremities.  Denies syncope or near syncope.  Denies swelling of her legs today but reports they were somewhat swollen yesterday.  Reports compliance with medications. She had chest pain previously with cath showing non obstructive disease about 20 months ago. She also had a positive stress test at that time. AT time of interview, she is comfortable with no significant chest pain. Her heparin and NTG are infusing. She has OSA and uses BiPAP.   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 with allergies/sinus issues   History of colon polyps 04/2016   History of MRSA infection 2007   Hypertension    no  problems since weight loss -'09   Multiple food allergies    Latex allergy-respiratory   Obesity    Tendonitis    Vitamin D deficiency    history of    Past Surgical History:  Procedure Laterality Date   ABDOMINAL HYSTERECTOMY     BOWEL RESECTION     CESAREAN SECTION     x4   COLONOSCOPY W/ POLYPECTOMY  04/2016   KNEE SURGERY     Right knee scope 8'12   LEFT HEART CATH AND CORONARY ANGIOGRAPHY N/A 02/13/2019   Procedure: LEFT HEART CATH AND CORONARY ANGIOGRAPHY;  Surgeon: Nelva Bush, MD;  Location: [name];  Service: Cardiovascular;  Laterality: N/A;   TOTAL KNEE ARTHROPLASTY Right 01/31/2013   Procedure: RIGHT TOTAL KNEE ARTHROPLASTY;  Surgeon: [name], MD;  Location: [facility];  Service: Orthopedics;  Laterality: Right;   TOTAL KNEE ARTHROPLASTY Left 02/15/2018   Procedure: LEFT TOTAL KNEE ARTHROPLASTY;  Surgeon: Paralee Cancel, MD;  Location: [facility];  Service: Orthopedics;  Laterality: Left;  68     Medications Prior to Admission: Prior to Admission medications   Medication Sig Start Date End Date Taking? Authorizing Provider  albuterol (PROVENTIL HFA;VENTOLIN HFA) 108 (90 BASE) MCG/ACT inhaler Inhale 2 puffs into the lungs every 6 (six) hours as needed for wheezing or shortness of breath.    [provider]  aspirin 81 MG chewable tablet Chew 81 mg by mouth  daily.    [provider]  atorvastatin (LIPITOR) 10 MG tablet Take 10 mg by mouth at bedtime.    [provider]  BREO ELLIPTA 200-25 MCG/INH AEPB Inhale 1 puff into the lungs daily. 12/28/17   [provider]  busPIRone (BUSPAR) 15 MG tablet Take 15 mg by mouth 3 (three) times daily.    [provider]  carvedilol (COREG) 6.25 MG tablet Take 1 tablet (6.25 mg total) by mouth 2 (two) times daily with a meal. 02/14/19   Bonnell Public, MD  diazepam (VALIUM) 10 MG tablet Take 10 mg by mouth 4 (four) times daily.    [provider]  EPINEPHrine 0.3  mg/0.3 mL IJ SOAJ injection Inject 0.3 mLs (0.3 mg total) into the muscle as needed for anaphylaxis. 02/19/19   Tacy Learn, PA-C  EPINEPHrine 0.3 mg/0.3 mL IJ SOAJ injection Inject 0.3 mg into the muscle as needed for anaphylaxis. 06/21/20   Breck Coons, MD  famotidine (PEPCID) 20 MG tablet Take 1 tablet (20 mg total) by mouth 2 (two) times daily. 12/12/19   Palumbo, April, MD  fluticasone (FLONASE) 50 MCG/ACT nasal spray Place 2 sprays into both nostrils daily as needed for allergies.     [provider]  glipiZIDE (GLUCOTROL XL) 10 MG 24 hr tablet Take 1 tablet by mouth daily. 10/17/18   [provider]  lisinopril (PRINIVIL,ZESTRIL) 10 MG tablet Take 10 mg by mouth daily.    [provider]  methocarbamol (ROBAXIN) 500 MG tablet Take 1 tablet (500 mg total) by mouth every 6 (six) hours as needed for muscle spasms. Patient not taking: Reported on 02/08/2019 02/15/18   [name], PA-C  montelukast (SINGULAIR) 10 MG tablet Take 10 mg by mouth daily. 10/06/17   [provider]  omeprazole (PRILOSEC) 20 MG capsule Take 20 mg by mouth daily.    [provider]  OZEMPIC, 0.25 OR 0.5 MG/DOSE, 2 MG/1.5ML SOPN 1 mg by Subconjunctival route See admin instructions. On Tuesdays 10/31/18   [provider]  polyethylene glycol (MIRALAX / GLYCOLAX) 17 g packet Take 17 g by mouth daily.    [provider]  predniSONE (DELTASONE) 10 MG tablet Take 4 tablets (40 mg total) by mouth daily. 06/21/20   Breck Coons, MD  predniSONE (DELTASONE) 20 MG tablet 3 tabs po day one, then 2 po daily x 4 days 12/12/19   Randal Buba, April, MD  QUEtiapine (SEROQUEL) 100 MG tablet Take 100 mg by mouth at bedtime. 10/27/17   [provider]  raloxifene (EVISTA) 60 MG tablet Take 60 mg by mouth daily. 02/14/19   [provider]  rOPINIRole (REQUIP) 1 MG tablet Take 1 mg by mouth at bedtime.    [provider]     Allergies:    Allergies   Allergen Reactions   Banana Shortness Of Breath and Swelling   Latex Shortness Of Breath   Lisinopril-Hydrochlorothiazide Swelling    Swelling (had epi by ems and required ED visit)   Onion Shortness Of Breath and Swelling   Penicillins Anaphylaxis, Swelling and Other (See Comments)    Has patient had a PCN reaction causing immediate rash, facial/tongue/throat swelling, SOB or lightheadedness with hypotension: Yes Has patient had a PCN reaction causing severe rash involving mucus membranes or skin necrosis: No Has patient had a PCN reaction that required hospitalization: Yes - inpatient Has patient had a PCN reaction occurring within the last 10 years: No If all of  the above answers are "NO", then may proceed with Cephalosporin use.    Tylenol [Acetaminophen] Other (See Comments)    Throat swelling    Codeine Swelling   Hydrocodone Hives and Swelling   Insect Wax     Any Insect that bites    Social History:   Social History   Socioeconomic History   Marital status: Married    Spouse name: antonio   Number of children: 6   Years of education: college   Highest education level: Not on file  Occupational History   Occupation: N/A  Tobacco Use   Smoking status: Former    Pack years: 0.00    Types: Cigarettes    Quit date: 01/25/2005    Years since quitting: 15.7   Smokeless tobacco: Never  Vaping Use   Vaping Use: Never used  Substance and Sexual Activity   Alcohol use: No   Drug use: No   Sexual activity: Yes    Partners: Male  Other Topics Concern   Not on file  Social History Narrative   Not on file   Social Determinants of Health   Financial Resource Strain: Not on file  Food Insecurity: Not on file  Transportation Needs: Not on file  Physical Activity: Not on file  Stress: Not on file  Social Connections: Not on file  Intimate Partner Violence: Not on file    Family History:   The patient's family history includes Diabetes in her father; Lung cancer in  her mother. There is no history of Colon cancer.    ROS:  Please see the history of present illness.  All other ROS reviewed and negative.     Physical Exam/Data:   Vitals:   10/06/20 0047 10/06/20 0101 10/06/20 0242 10/06/20 0339  BP: (!) 132/98  (!) 133/100 (!) 120/102  Pulse: 97  90 90  Resp: 19  16 17   Temp: 98.6 F (37 C)   98.7 F (37.1 C)  TempSrc: Oral   Oral  SpO2: 100%  100% 100%  Weight:  93.9 kg    Height:  4\' 11"  (1.499 m)     No intake or output data in the 24 hours ending 10/06/20 0421 Last 3 Weights 10/06/2020 12/16/2019 02/13/2019  Weight (lbs) 207 lb 207 lb 203 lb 1.6 oz  Weight (kg) 93.895 kg 93.895 kg 92.126 kg     Body mass index is 41.81 kg/m.  General:  Well nourished, well developed, in no acute distress. HEENT: normal Lymph: no adenopathy Neck: no JVD Endocrine:  No thryomegaly Vascular: No carotid bruits; FA pulses 2+ bilaterally without bruits  Cardiac:  normal S1, S2; RRR; no murmur  Lungs:  clear to auscultation bilaterally, no wheezing, rhonchi or rales  Abd: soft, nontender, no hepatomegaly  Ext: 1+ edema Musculoskeletal:  No deformities, BUE and BLE strength normal and equal Skin: warm and dry  Neuro:  CNs 2-12 intact, no focal abnormalities noted Psych:  Normal affect    EKG:  The ECG that was done  was personally reviewed and demonstrates NSR with no ST-T changes to suggest ischemia  Relevant CV Studies: 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 Echo showed LVEF of 45-50%  Laboratory Data:  High Sensitivity Troponin:   Recent Labs  Lab 10/06/20 0114  TROPONINIHS 176*      Chemistry Recent Labs  Lab 10/06/20 0114  NA 140  K 3.5  CL 104  CO2 27  GLUCOSE 81  BUN 7  CREATININE 0.75  CALCIUM 8.8*  GFRNONAA >60  ANIONGAP 9    Recent Labs  Lab 10/06/20 0114  PROT 6.6  ALBUMIN 3.5  AST 21  ALT 18  ALKPHOS 81  BILITOT 0.4   Hematology Recent Labs  Lab 10/06/20 0114  WBC 7.2  RBC 4.49  HGB 13.4  HCT 42.8  MCV  95.3  MCH 29.8  MCHC 31.3  RDW 14.7  PLT 374   BNP Recent Labs  Lab 10/06/20 0114  BNP 6.2    DDimer No results for input(s): DDIMER in the last 168 hours.   Radiology/Studies:  DG Chest 2 View  Result Date: 10/06/2020 CLINICAL DATA:  Chest pain for 6 months. Shortness of breath and headache. EXAM: CHEST - 2 VIEW COMPARISON:  02/19/2019 FINDINGS: Heart size and pulmonary vascularity are normal. Shallow inspiration with linear atelectasis in the lung bases. No pleural effusions. No pneumothorax. Mediastinal contours appear intact. Thoracolumbar scoliosis and degenerative changes. IMPRESSION: Shallow inspiration with atelectasis in the lung bases. Electronically Signed   By: Lucienne Capers M.D.   On: 10/06/2020 03:24     Assessment and Plan:   NSTEMI: Has new onset chest pain and mild elevation of trops suggesting NSTEMI in a patient with underlying diabetes and HTN. Will treat for NSTEMI and plan of cardiac cath. On ASA, statins and BB. Allergic to ACEI. Will order echo to evaluate for wall motion abnormality. HTN: Controlled. Will continue coreg. DM: Continue home meds. OSA: On BiPAP. On full dose heparin and on famotidine for GI prophylaxis.   TIMI Risk Score for Unstable Angina or Non-ST Elevation MI:   The patient's TIMI risk score is 4, which indicates a 20% risk of all cause mortality, new or recurrent myocardial infarction or need for urgent revascularization in the next 14 days.  New York Heart Association (NYHA) Functional Class NYHA Class II     Severity of Illness: The appropriate patient status for this patient is OBSERVATION. Observation status is judged to be reasonable and necessary in order to provide the required intensity of service to ensure the patient's safety. The patient's presenting symptoms, physical exam findings, and initial radiographic and laboratory data in the context of their medical condition is felt to place them at decreased risk for further  clinical deterioration. Furthermore, it is anticipated that the patient will be medically stable for discharge from the hospital within 2 midnights of admission. The following factors support the patient status of observation.   " The patient's presenting symptoms include chest pain. " The initial radiographic and laboratory data are s/o NSTEMI.   For questions or updates, please contact [place] Please consult [email or web address] for contact info under     Signed, Robinette Haines, MD  10/06/2020 4:21 AM

## 2020-10-06 NOTE — Progress Notes (Signed)
ANTICOAGULATION CONSULT NOTE - Initial Consult  Pharmacy Consult for Heparin Indication: chest pain/ACS  Allergies  Allergen Reactions   Banana Shortness Of Breath and Swelling   Latex Shortness Of Breath   Lisinopril-Hydrochlorothiazide Swelling    Swelling (had epi by ems and required ED visit)   Onion Shortness Of Breath and Swelling   Penicillins Anaphylaxis, Swelling and Other (See Comments)    Has patient had a PCN reaction causing immediate rash, facial/tongue/throat swelling, SOB or lightheadedness with hypotension: Yes Has patient had a PCN reaction causing severe rash involving mucus membranes or skin necrosis: No Has patient had a PCN reaction that required hospitalization: Yes - inpatient Has patient had a PCN reaction occurring within the last 10 years: No If all of the above answers are "NO", then may proceed with Cephalosporin use.    Tylenol [Acetaminophen] Other (See Comments)    Throat swelling    Codeine Swelling   Hydrocodone Hives and Swelling   Insect Wax     Any Insect that bites    Patient Measurements: Height: 4\' 11"  (149.9 cm) Weight: 93.9 kg (207 lb) IBW/kg (Calculated) : 43.2 Heparin Dosing Weight: 65 kg  Vital Signs: Temp: 98.7 F (37.1 C) (07/03 0339) Temp Source: Oral (07/03 0339) BP: 120/102 (07/03 0339) Pulse Rate: 90 (07/03 0339)  Labs: Recent Labs    10/06/20 0114  HGB 13.4  HCT 42.8  PLT 374  CREATININE 0.75  TROPONINIHS 176*    Estimated Creatinine Clearance: 78.7 mL/min (by C-G formula based on SCr of 0.75 mg/dL).   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 with allergies/sinus issues    History of colon polyps 04/2016   History of MRSA infection 2007   Hypertension    no problems since weight loss -'09   Multiple food allergies    Latex allergy-respiratory   Obesity    Tendonitis    Vitamin D deficiency    history of    Medications:  No current facility-administered medications on file prior to encounter.   Current Outpatient Medications on File Prior to Encounter  Medication Sig Dispense Refill   albuterol (PROVENTIL HFA;VENTOLIN HFA) 108 (90 BASE) MCG/ACT inhaler Inhale 2 puffs into the lungs every 6 (six) hours as needed for wheezing or shortness of breath.     aspirin 81 MG chewable tablet Chew 81 mg by mouth daily.     atorvastatin (LIPITOR) 10 MG tablet Take 10 mg by mouth at bedtime.     BREO ELLIPTA 200-25 MCG/INH AEPB Inhale 1 puff into the lungs daily.  3   busPIRone (BUSPAR) 15 MG tablet Take 15 mg by mouth 3 (three) times daily.     carvedilol (COREG) 6.25 MG tablet Take 1 tablet (6.25 mg total) by mouth 2 (two) times daily with a meal. 60 tablet 0   diazepam (VALIUM) 10 MG tablet Take 10 mg by mouth 4 (four) times daily.     EPINEPHrine 0.3 mg/0.3 mL IJ SOAJ injection Inject 0.3 mLs (0.3 mg total) into the muscle as needed for anaphylaxis. 1 each 0   EPINEPHrine 0.3 mg/0.3 mL IJ  SOAJ injection Inject 0.3 mg into the muscle as needed for anaphylaxis. 2 each 2   famotidine (PEPCID) 20 MG tablet Take 1 tablet (20 mg total) by mouth 2 (two) times daily. 10 tablet 0   fluticasone (FLONASE) 50 MCG/ACT nasal spray Place 2 sprays into both nostrils daily as needed for allergies.      glipiZIDE (GLUCOTROL XL) 10 MG 24 hr tablet Take 1 tablet by mouth daily.     lisinopril (PRINIVIL,ZESTRIL) 10 MG tablet Take 10 mg by mouth daily.     methocarbamol (ROBAXIN) 500 MG tablet Take 1 tablet (500 mg total) by mouth every 6 (six) hours as needed for muscle spasms. (Patient not taking: Reported on 02/08/2019) 40 tablet 0   montelukast (SINGULAIR) 10 MG tablet Take 10 mg by  mouth daily.     omeprazole (PRILOSEC) 20 MG capsule Take 20 mg by mouth daily.     OZEMPIC, 0.25 OR 0.5 MG/DOSE, 2 MG/1.5ML SOPN 1 mg by Subconjunctival route See admin instructions. On Tuesdays     polyethylene glycol (MIRALAX / GLYCOLAX) 17 g packet Take 17 g by mouth daily.     predniSONE (DELTASONE) 10 MG tablet Take 4 tablets (40 mg total) by mouth daily. 15 tablet 0   predniSONE (DELTASONE) 20 MG tablet 3 tabs po day one, then 2 po daily x 4 days 11 tablet 0   QUEtiapine (SEROQUEL) 100 MG tablet Take 100 mg by mouth at bedtime.  1   raloxifene (EVISTA) 60 MG tablet Take 60 mg by mouth daily.     rOPINIRole (REQUIP) 1 MG tablet Take 1 mg by mouth at bedtime.       Assessment: 55 y.o. female with chest pain for heparin  Goal of Therapy:  Heparin level 0.3-0.7 units/ml Monitor platelets by anticoagulation protocol: Yes   Plan:  Heparin 4000 units IV bolus, then start heparin 1100 units/hr Check heparin level in 8 hours.   Caryl Pina 10/06/2020,4:09 AM

## 2020-10-06 NOTE — ED Notes (Signed)
Tried to call report. Nurse is in a room. Will call back in 15 minutes

## 2020-10-06 NOTE — Progress Notes (Signed)
[name] for Heparin Indication: chest pain/ACS  Allergies  Allergen Reactions   Banana Shortness Of Breath and Swelling   Latex Shortness Of Breath   Lisinopril-Hydrochlorothiazide Swelling    Swelling (had epi by ems and required ED visit)   Onion Shortness Of Breath and Swelling   Penicillins Anaphylaxis, Swelling and Other (See Comments)    Has patient had a PCN reaction causing immediate rash, facial/tongue/throat swelling, SOB or lightheadedness with hypotension: Yes Has patient had a PCN reaction causing severe rash involving mucus membranes or skin necrosis: No Has patient had a PCN reaction that required hospitalization: Yes - inpatient Has patient had a PCN reaction occurring within the last 10 years: No If all of the above answers are "NO", then may proceed with Cephalosporin use.    Tylenol [Acetaminophen] Other (See Comments)    Throat swelling    Codeine Swelling   Hydrocodone Hives and Swelling   Insect Wax     Any Insect that bites    Patient Measurements: Height: 4\' 11"  (149.9 cm) Weight: 93.9 kg (207 lb) IBW/kg (Calculated) : 43.2 Heparin Dosing Weight: 65 kg  Vital Signs: Temp: 98.2 F (36.8 C) (07/03 2042) Temp Source: Oral (07/03 2042) BP: 108/74 (07/03 2042) Pulse Rate: 94 (07/03 1530)  Labs: Recent Labs    10/06/20 0114 10/06/20 0356 10/06/20 0815 10/06/20 1015 10/06/20 1200 10/06/20 2210  HGB 13.4  --   --   --   --   --   HCT 42.8  --   --   --   --   --   PLT 374  --   --   --   --   --   HEPARINUNFRC  --   --   --   --  0.90* 0.71*  CREATININE 0.75  --   --   --   --   --   TROPONINIHS 176* 239* 278* 280*  --   --    Estimated Creatinine Clearance: 78.7 mL/min (by C-G formula based on SCr of 0.75 mg/dL).   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 with allergies/sinus issues   History of colon polyps 04/2016   History of MRSA infection 2007   Hypertension    no problems since weight loss -'09   Multiple food allergies    Latex allergy-respiratory   Obesity    Tendonitis    Vitamin D deficiency    history of    Medications:  No current facility-administered medications on file prior to encounter.   Current Outpatient Medications on File Prior to Encounter  Medication Sig Dispense Refill   aspirin 81 MG chewable tablet Chew 81 mg by mouth daily.     atorvastatin (LIPITOR) 10 MG tablet Take 10 mg by mouth at bedtime.     busPIRone (BUSPAR) 15 MG tablet Take 15 mg by mouth 2 (two) times daily.     calcium carbonate (OS-CAL - DOSED IN MG OF ELEMENTAL CALCIUM) 1250 (500 Ca) MG tablet Take 1 tablet by mouth daily with breakfast.     carvedilol (  COREG) 6.25 MG tablet Take 1 tablet (6.25 mg total) by mouth 2 (two) times daily with a meal. 60 tablet 0   cholecalciferol (VITAMIN D3) 25 MCG (1000 UNIT) tablet Take 1,000 Units by mouth daily.     cyclobenzaprine (FLEXERIL) 10 MG tablet Take 10 mg by mouth at bedtime as needed for muscle spasms.     desvenlafaxine (PRISTIQ) 100 MG 24 hr tablet Take 100 mg by mouth daily.     diazepam (VALIUM) 10 MG tablet Take 10 mg by mouth 4 (four) times daily.     EPINEPHrine 0.3 mg/0.3 mL IJ SOAJ injection Inject 0.3 mg into the muscle as needed for anaphylaxis. 2 each 2   famotidine (PEPCID) 20 MG tablet Take 1 tablet (20 mg total) by mouth 2 (two) times daily. 10 tablet 0   fluticasone (FLONASE) 50 MCG/ACT nasal spray Place 2 sprays into both nostrils daily as needed for allergies.      furosemide (LASIX) 20 MG tablet Take 20 mg by mouth daily.     glipiZIDE (GLUCOTROL XL) 10 MG 24 hr tablet Take 10 mg  by mouth daily.     montelukast (SINGULAIR) 10 MG tablet Take 10 mg by mouth daily.     omeprazole (PRILOSEC) 20 MG capsule Take 20 mg by mouth daily.     OXYGEN Inhale 3 L into the lungs continuous.     OZEMPIC, 0.25 OR 0.5 MG/DOSE, 2 MG/1.5ML SOPN 1 mg by Subconjunctival route See admin instructions. On Tuesdays     polyethylene glycol (MIRALAX / GLYCOLAX) 17 g packet Take 17 g by mouth daily.     QUEtiapine (SEROQUEL) 200 MG tablet Take 200 mg by mouth at bedtime.     raloxifene (EVISTA) 60 MG tablet Take 60 mg by mouth daily.     rOPINIRole (REQUIP) 2 MG tablet Take 2 mg by mouth at bedtime.     albuterol (PROVENTIL HFA;VENTOLIN HFA) 108 (90 BASE) MCG/ACT inhaler Inhale 2 puffs into the lungs every 6 (six) hours as needed for wheezing or shortness of breath. (Patient not taking: Reported on 10/06/2020)     BREO ELLIPTA 200-25 MCG/INH AEPB Inhale 1 puff into the lungs daily. (Patient not taking: Reported on 10/06/2020)  3   EPINEPHrine 0.3 mg/0.3 mL IJ SOAJ injection Inject 0.3 mLs (0.3 mg total) into the muscle as needed for anaphylaxis. (Patient not taking: No sig reported) 1 each 0   methocarbamol (ROBAXIN) 500 MG tablet Take 1 tablet (500 mg total) by mouth every 6 (six) hours as needed for muscle spasms. (Patient not taking: No sig reported) 40 tablet 0   QUEtiapine (SEROQUEL) 100 MG tablet Take 100 mg by mouth at bedtime. (Patient not taking: Reported on 10/06/2020)  1   rOPINIRole (REQUIP) 1 MG tablet Take 1 mg by mouth at bedtime. (Patient not taking: Reported on 10/06/2020)     [DISCONTINUED] lisinopril (PRINIVIL,ZESTRIL) 10 MG tablet Take 10 mg by mouth daily.     [DISCONTINUED] predniSONE (DELTASONE) 10 MG tablet Take 4 tablets (40 mg total) by mouth daily. (Patient not taking: No sig reported) 15 tablet 0   [DISCONTINUED] predniSONE (DELTASONE) 20 MG tablet 3 tabs po day one, then 2 po daily x 4 days (Patient not taking: No sig reported) 11 tablet 0   [DISCONTINUED] Semaglutide, 1 MG/DOSE,  (OZEMPIC, 1 MG/DOSE,) 4 MG/3ML SOPN Inject 1 mg into the skin once a week. (Patient not taking: Reported on 10/06/2020)      Assessment: 55 y.o. female  with chest pain for heparin.  Heparin level resulting supratherapeutic at 0.9 IU/mL. H/H and platelets wnl.  7/3 PM update:  Heparin level just above goal after rate decrease  Goal of Therapy:  Heparin level 0.3-0.7 units/ml Monitor platelets by anticoagulation protocol: Yes   Plan:  Reduce heparin infusion to 850 units/hr Heparin level with AM labs  Monitor platelets, CBC, and for s/s of bleeding  Narda Bonds, PharmD, BCPS Clinical Pharmacist Phone: [phone]

## 2020-10-07 ENCOUNTER — Encounter (HOSPITAL_COMMUNITY): Payer: Self-pay | Admitting: Internal Medicine

## 2020-10-07 DIAGNOSIS — Z20822 Contact with and (suspected) exposure to covid-19: Secondary | ICD-10-CM | POA: Diagnosis present

## 2020-10-07 DIAGNOSIS — Z6841 Body Mass Index (BMI) 40.0 and over, adult: Secondary | ICD-10-CM | POA: Diagnosis not present

## 2020-10-07 DIAGNOSIS — Z880 Allergy status to penicillin: Secondary | ICD-10-CM | POA: Diagnosis not present

## 2020-10-07 DIAGNOSIS — Z91040 Latex allergy status: Secondary | ICD-10-CM | POA: Diagnosis not present

## 2020-10-07 DIAGNOSIS — Z96653 Presence of artificial knee joint, bilateral: Secondary | ICD-10-CM | POA: Diagnosis present

## 2020-10-07 DIAGNOSIS — K219 Gastro-esophageal reflux disease without esophagitis: Secondary | ICD-10-CM | POA: Diagnosis present

## 2020-10-07 DIAGNOSIS — Z885 Allergy status to narcotic agent status: Secondary | ICD-10-CM | POA: Diagnosis not present

## 2020-10-07 DIAGNOSIS — Z79899 Other long term (current) drug therapy: Secondary | ICD-10-CM | POA: Diagnosis not present

## 2020-10-07 DIAGNOSIS — G4733 Obstructive sleep apnea (adult) (pediatric): Secondary | ICD-10-CM | POA: Diagnosis present

## 2020-10-07 DIAGNOSIS — Z801 Family history of malignant neoplasm of trachea, bronchus and lung: Secondary | ICD-10-CM | POA: Diagnosis not present

## 2020-10-07 DIAGNOSIS — Z87891 Personal history of nicotine dependence: Secondary | ICD-10-CM | POA: Diagnosis not present

## 2020-10-07 DIAGNOSIS — Z833 Family history of diabetes mellitus: Secondary | ICD-10-CM | POA: Diagnosis not present

## 2020-10-07 DIAGNOSIS — Z7952 Long term (current) use of systemic steroids: Secondary | ICD-10-CM | POA: Diagnosis not present

## 2020-10-07 DIAGNOSIS — I200 Unstable angina: Secondary | ICD-10-CM | POA: Diagnosis present

## 2020-10-07 DIAGNOSIS — Z91018 Allergy to other foods: Secondary | ICD-10-CM | POA: Diagnosis not present

## 2020-10-07 DIAGNOSIS — I214 Non-ST elevation (NSTEMI) myocardial infarction: Secondary | ICD-10-CM | POA: Diagnosis present

## 2020-10-07 DIAGNOSIS — Z886 Allergy status to analgesic agent status: Secondary | ICD-10-CM | POA: Diagnosis not present

## 2020-10-07 DIAGNOSIS — I10 Essential (primary) hypertension: Secondary | ICD-10-CM | POA: Diagnosis present

## 2020-10-07 DIAGNOSIS — I25110 Atherosclerotic heart disease of native coronary artery with unstable angina pectoris: Secondary | ICD-10-CM | POA: Diagnosis present

## 2020-10-07 DIAGNOSIS — Z7982 Long term (current) use of aspirin: Secondary | ICD-10-CM | POA: Diagnosis not present

## 2020-10-07 DIAGNOSIS — E119 Type 2 diabetes mellitus without complications: Secondary | ICD-10-CM | POA: Diagnosis present

## 2020-10-07 DIAGNOSIS — Z7951 Long term (current) use of inhaled steroids: Secondary | ICD-10-CM | POA: Diagnosis not present

## 2020-10-07 DIAGNOSIS — Z9981 Dependence on supplemental oxygen: Secondary | ICD-10-CM | POA: Diagnosis not present

## 2020-10-07 DIAGNOSIS — Z7984 Long term (current) use of oral hypoglycemic drugs: Secondary | ICD-10-CM | POA: Diagnosis not present

## 2020-10-07 LAB — CBC
HCT: 37.2 % (ref 36.0–46.0)
Hemoglobin: 11.6 g/dL — ABNORMAL LOW (ref 12.0–15.0)
MCH: 29.9 pg (ref 26.0–34.0)
MCHC: 31.2 g/dL (ref 30.0–36.0)
MCV: 95.9 fL (ref 80.0–100.0)
Platelets: 321 10*3/uL (ref 150–400)
RBC: 3.88 MIL/uL (ref 3.87–5.11)
RDW: 14.7 % (ref 11.5–15.5)
WBC: 6.8 10*3/uL (ref 4.0–10.5)
nRBC: 0 % (ref 0.0–0.2)

## 2020-10-07 LAB — LIPID PANEL
Cholesterol: 117 mg/dL (ref 0–200)
HDL: 42 mg/dL (ref >40)
LDL Cholesterol: 61 mg/dL (ref 0–99)
Total CHOL/HDL Ratio: 2.8 RATIO
Triglycerides: 69 mg/dL (ref <150)
VLDL: 14 mg/dL (ref 0–40)

## 2020-10-07 LAB — GLUCOSE, CAPILLARY
Glucose-Capillary: 116 mg/dL — ABNORMAL HIGH (ref 70–99)
Glucose-Capillary: 136 mg/dL — ABNORMAL HIGH (ref 70–99)
Glucose-Capillary: 91 mg/dL (ref 70–99)
Glucose-Capillary: 116 mg/dL — ABNORMAL HIGH (ref 70–99)

## 2020-10-07 LAB — HEPARIN LEVEL (UNFRACTIONATED): Heparin Unfractionated: 0.52 IU/mL (ref 0.30–0.70)

## 2020-10-07 MED ORDER — DIAZEPAM 5 MG PO TABS
10.0000 mg | ORAL_TABLET | Freq: Four times a day (QID) | ORAL | Status: DC
  Administered 2020-10-07 – 2020-10-08 (×5): 10 mg via ORAL
  Filled 2020-10-07 (×5): qty 2

## 2020-10-07 MED ORDER — SODIUM CHLORIDE 0.9 % IV SOLN: 250.0000 mL | INTRAVENOUS | Status: DC | PRN

## 2020-10-07 MED ORDER — ASPIRIN 81 MG PO CHEW
81.0000 mg | CHEWABLE_TABLET | ORAL | Status: AC
  Administered 2020-10-08: 81 mg via ORAL

## 2020-10-07 MED ORDER — SODIUM CHLORIDE 0.9 % WEIGHT BASED INFUSION
1.0000 mL/kg/h | INTRAVENOUS | Status: DC
  Administered 2020-10-08: 1 mL/kg/h via INTRAVENOUS

## 2020-10-07 MED ORDER — SODIUM CHLORIDE 0.9% FLUSH: 3.0000 mL | INTRAVENOUS | Status: DC | PRN

## 2020-10-07 MED ORDER — VENLAFAXINE HCL ER 150 MG PO CP24
150.0000 mg | ORAL_CAPSULE | Freq: Every day | ORAL | Status: DC
  Administered 2020-10-07 – 2020-10-08 (×2): 150 mg via ORAL
  Filled 2020-10-07 (×2): qty 1

## 2020-10-07 MED ORDER — SODIUM CHLORIDE 0.9 % WEIGHT BASED INFUSION
3.0000 mL/kg/h | INTRAVENOUS | Status: DC
  Administered 2020-10-08: 3 mL/kg/h via INTRAVENOUS

## 2020-10-07 NOTE — Progress Notes (Signed)
RT placed pt on BIPAP dream station on home settings of 17/4 w/3 lpm bled into the system. Pt respiratory status is stable w/no distress noted at this time.

## 2020-10-07 NOTE — Progress Notes (Signed)
[facility] for Heparin Indication: chest pain/ACS  Allergies  Allergen Reactions   Banana Shortness Of Breath and Swelling   Latex Shortness Of Breath   Lisinopril-Hydrochlorothiazide Swelling    Swelling (had epi by ems and required ED visit)   Onion Shortness Of Breath and Swelling   Penicillins Anaphylaxis, Swelling and Other (See Comments)    Has patient had a PCN reaction causing immediate rash, facial/tongue/throat swelling, SOB or lightheadedness with hypotension: Yes Has patient had a PCN reaction causing severe rash involving mucus membranes or skin necrosis: No Has patient had a PCN reaction that required hospitalization: Yes - inpatient Has patient had a PCN reaction occurring within the last 10 years: No If all of the above answers are "NO", then may proceed with Cephalosporin use.    Tylenol [Acetaminophen] Other (See Comments)    Throat swelling    Codeine Swelling   Hydrocodone Hives and Swelling   Insect Wax     Any Insect that bites    Patient Measurements: Height: 4\' 11"  (149.9 cm) Weight: 95.9 kg (211 lb 8 oz) IBW/kg (Calculated) : 43.2 Heparin Dosing Weight: 65 kg  Vital Signs: Temp: 98.1 F (36.7 C) (07/04 0727) Temp Source: Oral (07/04 0727) BP: 133/83 (07/04 0727) Pulse Rate: 99 (07/04 0727)  Labs: Recent Labs    10/06/20 0114 10/06/20 0356 10/06/20 0815 10/06/20 1015 10/06/20 1200 10/06/20 2210 10/07/20 0153  HGB 13.4  --   --   --   --   --  11.6*  HCT 42.8  --   --   --   --   --  37.2  PLT 374  --   --   --   --   --  321  HEPARINUNFRC  --   --   --   --  0.90* 0.71* 0.52  CREATININE 0.75  --   --   --   --   --   --   TROPONINIHS 176* 239* 278* 280*  --   --   --    Estimated Creatinine Clearance: 79.7 mL/min (by C-G formula based on SCr of 0.75 mg/dL).   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 with allergies/sinus issues   History of colon polyps 04/2016   History of MRSA infection 2007   Hypertension    no problems since weight loss -'09   Multiple food allergies    Latex allergy-respiratory   Obesity    Tendonitis    Vitamin D deficiency    history of     Assessment: 55 y.o. female with chest pain. No anticoagulation prior to admission. Pharmacy consulted for heparin.    Heparin level down to 0.52 IU/mL therapeutic. H/H drop likely due to dilution and platelets stable. No noted bleed. Cath 7/5    Goal of Therapy:  Heparin level 0.3-0.7 units/ml Monitor platelets by anticoagulation protocol: Yes   Plan:  Increase heparin infusion to 900 units/hr to prevent further drop Monitor daily HL, CBC/plt Monitor for signs/symptoms of bleeding  F/u cath  Benetta Spar, PharmD, BCPS, BCCP Clinical  Pharmacist  Please check AMION for all [facility] phone numbers After 10:00 PM, call [place] [phone]

## 2020-10-07 NOTE — Progress Notes (Signed)
Progress Note  Patient Name: [name] Date of Encounter: 10/07/2020  Primary Cardiologist: Jenne Campus, MD   Subjective   Still some chest pain and HA. ECG pending.  Inpatient Medications    Scheduled Meds:  aspirin  81 mg Oral Daily   atorvastatin  10 mg Oral QHS   busPIRone  15 mg Oral TID   carvedilol  6.25 mg Oral BID WC   famotidine  20 mg Oral BID   fluticasone furoate-vilanterol  1 puff Inhalation Daily   glipiZIDE  10 mg Oral Q breakfast   montelukast  10 mg Oral Daily   QUEtiapine  100 mg Oral QHS   rOPINIRole  1 mg Oral QHS   sodium chloride flush  3 mL Intravenous Q12H   Continuous Infusions:  heparin 850 Units/hr (10/07/20 0700)   nitroGLYCERIN 10 mcg/min (10/07/20 0700)   PRN Meds: albuterol, EPINEPHrine, fluticasone, methocarbamol, nitroGLYCERIN, ondansetron (ZOFRAN) IV   Vital Signs    Vitals:   10/07/20 0150 10/07/20 0638 10/07/20 0727 10/07/20 0756  BP:  128/77 133/83   Pulse: 89 99 99   Resp: 18 18    Temp:  98 F (36.7 C) 98.1 F (36.7 C)   TempSrc:  Oral Oral   SpO2: 95% 96%  97%  Weight:  95.9 kg    Height:        Intake/Output Summary (Last 24 hours) at 10/07/2020 0850 Last data filed at 10/07/2020 0700 Gross per 24 hour  Intake 732.74 ml  Output --  Net 732.74 ml   Filed Weights   10/06/20 0101 10/07/20 0638  Weight: 93.9 kg 95.9 kg    Telemetry    Sinus tachycardia - Personally Reviewed  ECG    pending - Personally Reviewed  Physical Exam   GEN: obese middle aged woman, no acute distress.   Neck: 6 cm JVD Cardiac: Reg  tachy, no murmurs, rubs, or gallops.  Respiratory: Clear to auscultation bilaterally. GI: Soft, nontender, non-distended  MS: No edema; No deformity. Neuro:  Nonfocal  Psych: Normal affect   Labs    Chemistry Recent Labs  Lab 10/06/20 0114  NA 140  K 3.5  CL 104  CO2 27  GLUCOSE 81  BUN 7  CREATININE 0.75  CALCIUM 8.8*  PROT 6.6  ALBUMIN 3.5  AST 21  ALT 18  ALKPHOS 81   BILITOT 0.4  GFRNONAA >60  ANIONGAP 9     Hematology Recent Labs  Lab 10/06/20 0114 10/07/20 0153  WBC 7.2 6.8  RBC 4.49 3.88  HGB 13.4 11.6*  HCT 42.8 37.2  MCV 95.3 95.9  MCH 29.8 29.9  MCHC 31.3 31.2  RDW 14.7 14.7  PLT 374 321    Cardiac EnzymesNo results for input(s): TROPONINI in the last 168 hours. No results for input(s): TROPIPOC in the last 168 hours.   BNP Recent Labs  Lab 10/06/20 0114  BNP 6.2     DDimer No results for input(s): DDIMER in the last 168 hours.   Radiology    DG Chest 2 View  Result Date: 10/06/2020 CLINICAL DATA:  Chest pain for 6 months. Shortness of breath and headache. EXAM: CHEST - 2 VIEW COMPARISON:  02/19/2019 FINDINGS: Heart size and pulmonary vascularity are normal. Shallow inspiration with linear atelectasis in the lung bases. No pleural effusions. No pneumothorax. Mediastinal contours appear intact. Thoracolumbar scoliosis and degenerative changes. IMPRESSION: Shallow inspiration with atelectasis in the lung bases. Electronically Signed   By: Oren Beckmann.D.  On: 10/06/2020 03:24   ECHOCARDIOGRAM COMPLETE  Result Date: 10/06/2020    ECHOCARDIOGRAM REPORT   Patient Name:   [name] Date of Exam: 10/06/2020 Medical Rec #:  [id number]   Height:       59.0 in Accession #:    [id number]  Weight:       207.0 lb Date of Birth:  07/14/1965  BSA:          1.872 m Patient Age:    55 years    BP:           119/82 mmHg Patient Gender: F           HR:           88 bpm. Exam Location:  Inpatient Procedure: 2D Echo Indications:    acute ischemic heart disease  History:        Patient has prior history of Echocardiogram examinations, most                 recent 02/09/2019. PFO; Risk Factors:Hypertension.  Sonographer:    Johny Chess Referring Phys: [id number] JACOB M MATHEW  Sonographer Comments: Suboptimal subcostal window. Image acquisition challenging due to patient body habitus. IMPRESSIONS  1. Left ventricular ejection fraction, by  estimation, is 55 to 60%. The left ventricle has normal function. The left ventricle has no regional wall motion abnormalities. Left ventricular diastolic parameters are consistent with Grade I diastolic dysfunction (impaired relaxation).  2. Right ventricular systolic function is normal. The right ventricular size is normal.  3. The mitral valve is grossly normal. No evidence of mitral valve regurgitation.  4. The aortic valve is tricuspid. Aortic valve regurgitation is not visualized.  5. The inferior vena cava is normal in size with greater than 50% respiratory variability, suggesting right atrial pressure of 3 mmHg. Comparison(s): 02/09/2019: LVEF 45-50%. FINDINGS  Left Ventricle: Left ventricular ejection fraction, by estimation, is 55 to 60%. The left ventricle has normal function. The left ventricle has no regional wall motion abnormalities. The left ventricular internal cavity size was normal in size. There is  no left ventricular hypertrophy. Left ventricular diastolic parameters are consistent with Grade I diastolic dysfunction (impaired relaxation). Indeterminate filling pressures. Right Ventricle: The right ventricular size is normal. No increase in right ventricular wall thickness. Right ventricular systolic function is normal. Left Atrium: Left atrial size was normal in size. Right Atrium: Right atrial size was normal in size. Pericardium: There is no evidence of pericardial effusion. Mitral Valve: The mitral valve is grossly normal. No evidence of mitral valve regurgitation. Tricuspid Valve: The tricuspid valve is grossly normal. Tricuspid valve regurgitation is not demonstrated. Aortic Valve: The aortic valve is tricuspid. Aortic valve regurgitation is not visualized. Pulmonic Valve: The pulmonic valve was normal in structure. Pulmonic valve regurgitation is not visualized. Aorta: The aortic root and ascending aorta are structurally normal, with no evidence of dilitation. Venous: The inferior vena  cava is normal in size with greater than 50% respiratory variability, suggesting right atrial pressure of 3 mmHg. IAS/Shunts: The interatrial septum was not well visualized.  LEFT VENTRICLE PLAX 2D LVIDd:         4.30 cm Diastology LVIDs:         2.80 cm LV e' medial:    4.90 cm/s LV PW:         1.10 cm LV E/e' medial:  12.8 LV IVS:        1.00 cm LV e' lateral:   6.85 cm/s  LV E/e' lateral: 9.1  RIGHT VENTRICLE RV S prime:     15.00 cm/s TAPSE (M-mode): 1.6 cm LEFT ATRIUM           Index       RIGHT ATRIUM          Index LA diam:      2.80 cm 1.50 cm/m  RA Area:     8.03 cm LA Vol (A4C): 29.1 ml 15.55 ml/m RA Volume:   14.20 ml 7.59 ml/m  AORTIC VALVE LVOT Vmax:   70.70 cm/s LVOT Vmean:  46.700 cm/s LVOT VTI:    0.120 m  AORTA Ao Asc diam: 2.90 cm MITRAL VALVE MV Area (PHT): 3.89 cm    SHUNTS MV Decel Time: 195 msec    Systemic VTI: 0.12 m MV E velocity: 62.60 cm/s MV A velocity: 65.60 cm/s MV E/A ratio:  0.95 Lyman Bishop MD Electronically signed by Lyman Bishop MD Signature Date/Time: 10/06/2020/4:35:38 PM    Final     Cardiac Studies   Echo reviewed  Patient Profile     55 y.o. female admitted with chest pain, pending left heart cath  Assessment & Plan    Chest pain - she is on IV heparin and IV NTG and her pain is mostly controlled. Left heart cath for tomorrow HA - it is better, especially if she closes her eyes. I think it will resolve once the IV NTG is off.   For questions or updates, please contact [place] Please consult [email or web address] for contact info under Cardiology/STEMI.   Signed, Cristopher Peru, MD  10/07/2020, 8:50 AM   Patient ID: [name], female   DOB: March 09, 1966, 55 y.o.   MRN: [id number]

## 2020-10-07 NOTE — H&P (View-Only) (Signed)
Progress Note  Patient Name: [name] Date of Encounter: 10/07/2020  Primary Cardiologist: Jenne Campus, MD   Subjective   Still some chest pain and HA. ECG pending.  Inpatient Medications    Scheduled Meds:  aspirin  81 mg Oral Daily   atorvastatin  10 mg Oral QHS   busPIRone  15 mg Oral TID   carvedilol  6.25 mg Oral BID WC   famotidine  20 mg Oral BID   fluticasone furoate-vilanterol  1 puff Inhalation Daily   glipiZIDE  10 mg Oral Q breakfast   montelukast  10 mg Oral Daily   QUEtiapine  100 mg Oral QHS   rOPINIRole  1 mg Oral QHS   sodium chloride flush  3 mL Intravenous Q12H   Continuous Infusions:  heparin 850 Units/hr (10/07/20 0700)   nitroGLYCERIN 10 mcg/min (10/07/20 0700)   PRN Meds: albuterol, EPINEPHrine, fluticasone, methocarbamol, nitroGLYCERIN, ondansetron (ZOFRAN) IV   Vital Signs    Vitals:   10/07/20 0150 10/07/20 0638 10/07/20 0727 10/07/20 0756  BP:  128/77 133/83   Pulse: 89 99 99   Resp: 18 18    Temp:  98 F (36.7 C) 98.1 F (36.7 C)   TempSrc:  Oral Oral   SpO2: 95% 96%  97%  Weight:  95.9 kg    Height:        Intake/Output Summary (Last 24 hours) at 10/07/2020 0850 Last data filed at 10/07/2020 0700 Gross per 24 hour  Intake 732.74 ml  Output --  Net 732.74 ml   Filed Weights   10/06/20 0101 10/07/20 0638  Weight: 93.9 kg 95.9 kg    Telemetry    Sinus tachycardia - Personally Reviewed  ECG    pending - Personally Reviewed  Physical Exam   GEN: obese middle aged woman, no acute distress.   Neck: 6 cm JVD Cardiac: Reg  tachy, no murmurs, rubs, or gallops.  Respiratory: Clear to auscultation bilaterally. GI: Soft, nontender, non-distended  MS: No edema; No deformity. Neuro:  Nonfocal  Psych: Normal affect   Labs    Chemistry Recent Labs  Lab 10/06/20 0114  NA 140  K 3.5  CL 104  CO2 27  GLUCOSE 81  BUN 7  CREATININE 0.75  CALCIUM 8.8*  PROT 6.6  ALBUMIN 3.5  AST 21  ALT 18  ALKPHOS 81   BILITOT 0.4  GFRNONAA >60  ANIONGAP 9     Hematology Recent Labs  Lab 10/06/20 0114 10/07/20 0153  WBC 7.2 6.8  RBC 4.49 3.88  HGB 13.4 11.6*  HCT 42.8 37.2  MCV 95.3 95.9  MCH 29.8 29.9  MCHC 31.3 31.2  RDW 14.7 14.7  PLT 374 321    Cardiac EnzymesNo results for input(s): TROPONINI in the last 168 hours. No results for input(s): TROPIPOC in the last 168 hours.   BNP Recent Labs  Lab 10/06/20 0114  BNP 6.2     DDimer No results for input(s): DDIMER in the last 168 hours.   Radiology    DG Chest 2 View  Result Date: 10/06/2020 CLINICAL DATA:  Chest pain for 6 months. Shortness of breath and headache. EXAM: CHEST - 2 VIEW COMPARISON:  02/19/2019 FINDINGS: Heart size and pulmonary vascularity are normal. Shallow inspiration with linear atelectasis in the lung bases. No pleural effusions. No pneumothorax. Mediastinal contours appear intact. Thoracolumbar scoliosis and degenerative changes. IMPRESSION: Shallow inspiration with atelectasis in the lung bases. Electronically Signed   By: Oren Beckmann.D.  On: 10/06/2020 03:24   ECHOCARDIOGRAM COMPLETE  Result Date: 10/06/2020    ECHOCARDIOGRAM REPORT   Patient Name:   [name] Date of Exam: 10/06/2020 Medical Rec #:  [id number]   Height:       59.0 in Accession #:    [id number]  Weight:       207.0 lb Date of Birth:  Aug 23, 1965  BSA:          1.872 m Patient Age:    55 years    BP:           119/82 mmHg Patient Gender: F           HR:           88 bpm. Exam Location:  Inpatient Procedure: 2D Echo Indications:    acute ischemic heart disease  History:        Patient has prior history of Echocardiogram examinations, most                 recent 02/09/2019. PFO; Risk Factors:Hypertension.  Sonographer:    Johny Chess Referring Phys: [id number] JACOB M MATHEW  Sonographer Comments: Suboptimal subcostal window. Image acquisition challenging due to patient body habitus. IMPRESSIONS  1. Left ventricular ejection fraction, by  estimation, is 55 to 60%. The left ventricle has normal function. The left ventricle has no regional wall motion abnormalities. Left ventricular diastolic parameters are consistent with Grade I diastolic dysfunction (impaired relaxation).  2. Right ventricular systolic function is normal. The right ventricular size is normal.  3. The mitral valve is grossly normal. No evidence of mitral valve regurgitation.  4. The aortic valve is tricuspid. Aortic valve regurgitation is not visualized.  5. The inferior vena cava is normal in size with greater than 50% respiratory variability, suggesting right atrial pressure of 3 mmHg. Comparison(s): 02/09/2019: LVEF 45-50%. FINDINGS  Left Ventricle: Left ventricular ejection fraction, by estimation, is 55 to 60%. The left ventricle has normal function. The left ventricle has no regional wall motion abnormalities. The left ventricular internal cavity size was normal in size. There is  no left ventricular hypertrophy. Left ventricular diastolic parameters are consistent with Grade I diastolic dysfunction (impaired relaxation). Indeterminate filling pressures. Right Ventricle: The right ventricular size is normal. No increase in right ventricular wall thickness. Right ventricular systolic function is normal. Left Atrium: Left atrial size was normal in size. Right Atrium: Right atrial size was normal in size. Pericardium: There is no evidence of pericardial effusion. Mitral Valve: The mitral valve is grossly normal. No evidence of mitral valve regurgitation. Tricuspid Valve: The tricuspid valve is grossly normal. Tricuspid valve regurgitation is not demonstrated. Aortic Valve: The aortic valve is tricuspid. Aortic valve regurgitation is not visualized. Pulmonic Valve: The pulmonic valve was normal in structure. Pulmonic valve regurgitation is not visualized. Aorta: The aortic root and ascending aorta are structurally normal, with no evidence of dilitation. Venous: The inferior vena  cava is normal in size with greater than 50% respiratory variability, suggesting right atrial pressure of 3 mmHg. IAS/Shunts: The interatrial septum was not well visualized.  LEFT VENTRICLE PLAX 2D LVIDd:         4.30 cm Diastology LVIDs:         2.80 cm LV e' medial:    4.90 cm/s LV PW:         1.10 cm LV E/e' medial:  12.8 LV IVS:        1.00 cm LV e' lateral:   6.85 cm/s  LV E/e' lateral: 9.1  RIGHT VENTRICLE RV S prime:     15.00 cm/s TAPSE (M-mode): 1.6 cm LEFT ATRIUM           Index       RIGHT ATRIUM          Index LA diam:      2.80 cm 1.50 cm/m  RA Area:     8.03 cm LA Vol (A4C): 29.1 ml 15.55 ml/m RA Volume:   14.20 ml 7.59 ml/m  AORTIC VALVE LVOT Vmax:   70.70 cm/s LVOT Vmean:  46.700 cm/s LVOT VTI:    0.120 m  AORTA Ao Asc diam: 2.90 cm MITRAL VALVE MV Area (PHT): 3.89 cm    SHUNTS MV Decel Time: 195 msec    Systemic VTI: 0.12 m MV E velocity: 62.60 cm/s MV A velocity: 65.60 cm/s MV E/A ratio:  0.95 Lyman Bishop MD Electronically signed by Lyman Bishop MD Signature Date/Time: 10/06/2020/4:35:38 PM    Final     Cardiac Studies   Echo reviewed  Patient Profile     55 y.o. female admitted with chest pain, pending left heart cath  Assessment & Plan    Chest pain - she is on IV heparin and IV NTG and her pain is mostly controlled. Left heart cath for tomorrow HA - it is better, especially if she closes her eyes. I think it will resolve once the IV NTG is off.   For questions or updates, please contact [place] Please consult [email or web address] for contact info under Cardiology/STEMI.   Signed, Cristopher Peru, MD  10/07/2020, 8:50 AM   Patient ID: [name], female   DOB: 1965/09/12, 55 y.o.   MRN: [id number]

## 2020-10-07 NOTE — Progress Notes (Signed)
Patient stated she is about to eat right now and is not ready for CPAP. Patient stated she will call when she is ready for CPAP.

## 2020-10-08 ENCOUNTER — Encounter (HOSPITAL_COMMUNITY): Payer: Self-pay | Admitting: Cardiology

## 2020-10-08 ENCOUNTER — Encounter (HOSPITAL_COMMUNITY)
Admission: EM | Disposition: A | Discharge status: Home / Self Care | Payer: Self-pay | Source: Home / Self Care | Attending: Cardiology

## 2020-10-08 HISTORY — PX: LEFT HEART CATH AND CORONARY ANGIOGRAPHY: CATH118249

## 2020-10-08 LAB — CBC
HCT: 35.4 % — ABNORMAL LOW (ref 36.0–46.0)
Hemoglobin: 11.2 g/dL — ABNORMAL LOW (ref 12.0–15.0)
MCH: 29.9 pg (ref 26.0–34.0)
MCHC: 31.6 g/dL (ref 30.0–36.0)
MCV: 94.7 fL (ref 80.0–100.0)
Platelets: 302 10*3/uL (ref 150–400)
RBC: 3.74 MIL/uL — ABNORMAL LOW (ref 3.87–5.11)
RDW: 14.6 % (ref 11.5–15.5)
WBC: 8 10*3/uL (ref 4.0–10.5)
nRBC: 0 % (ref 0.0–0.2)

## 2020-10-08 LAB — GLUCOSE, CAPILLARY: Glucose-Capillary: 100 mg/dL — ABNORMAL HIGH (ref 70–99)

## 2020-10-08 LAB — CREATININE, SERUM
Creatinine, Ser: 0.69 mg/dL (ref 0.44–1.00)
GFR, Estimated: >60 mL/min (ref >60)

## 2020-10-08 LAB — HEPARIN LEVEL (UNFRACTIONATED): Heparin Unfractionated: 0.38 IU/mL (ref 0.30–0.70)

## 2020-10-08 SURGERY — LEFT HEART CATH AND CORONARY ANGIOGRAPHY
Anesthesia: LOCAL

## 2020-10-08 MED ORDER — SODIUM CHLORIDE 0.9 % WEIGHT BASED INFUSION
1.0000 mL/kg/h | INTRAVENOUS | Status: AC
  Administered 2020-10-08: 1 mL/kg/h via INTRAVENOUS

## 2020-10-08 MED ORDER — AMLODIPINE BESYLATE 5 MG PO TABS
5.0000 mg | ORAL_TABLET | Freq: Every day | ORAL | Status: DC
  Administered 2020-10-08: 5 mg via ORAL
  Filled 2020-10-08: qty 1

## 2020-10-08 MED ORDER — FENTANYL CITRATE (PF) 100 MCG/2ML IJ SOLN
INTRAMUSCULAR | Status: AC
  Filled 2020-10-08: qty 2

## 2020-10-08 MED ORDER — FENTANYL CITRATE (PF) 100 MCG/2ML IJ SOLN
INTRAMUSCULAR | Status: DC | PRN
  Administered 2020-10-08 (×2): 25 ug via INTRAVENOUS

## 2020-10-08 MED ORDER — MIDAZOLAM HCL 2 MG/2ML IJ SOLN
INTRAMUSCULAR | Status: AC
  Filled 2020-10-08: qty 2

## 2020-10-08 MED ORDER — ENOXAPARIN SODIUM 40 MG/0.4ML IJ SOSY: 40.0000 mg | PREFILLED_SYRINGE | INTRAMUSCULAR | Status: DC

## 2020-10-08 MED ORDER — SODIUM CHLORIDE 0.9% FLUSH: 3.0000 mL | Freq: Two times a day (BID) | INTRAVENOUS | Status: DC

## 2020-10-08 MED ORDER — IOHEXOL 350 MG/ML SOLN
INTRAVENOUS | Status: DC | PRN
  Administered 2020-10-08: 75 mL via INTRA_ARTERIAL

## 2020-10-08 MED ORDER — LIDOCAINE HCL (PF) 1 % IJ SOLN
INTRAMUSCULAR | Status: AC
  Filled 2020-10-08: qty 30

## 2020-10-08 MED ORDER — HYDRALAZINE HCL 20 MG/ML IJ SOLN: 10.0000 mg | INTRAMUSCULAR | Status: DC | PRN

## 2020-10-08 MED ORDER — AMLODIPINE BESYLATE 5 MG PO TABS: 5.0000 mg | ORAL_TABLET | Freq: Every day | ORAL | 1 refills | Status: DC

## 2020-10-08 MED ORDER — MIDAZOLAM HCL 2 MG/2ML IJ SOLN
INTRAMUSCULAR | Status: DC | PRN
  Administered 2020-10-08: 2 mg via INTRAVENOUS
  Administered 2020-10-08: 1 mg via INTRAVENOUS

## 2020-10-08 MED ORDER — SODIUM CHLORIDE 0.9% FLUSH: 3.0000 mL | INTRAVENOUS | Status: DC | PRN

## 2020-10-08 MED ORDER — SODIUM CHLORIDE 0.9 % IV SOLN: 250.0000 mL | INTRAVENOUS | Status: DC | PRN

## 2020-10-08 MED ORDER — LIDOCAINE HCL (PF) 1 % IJ SOLN
INTRAMUSCULAR | Status: DC | PRN
  Administered 2020-10-08: 15 mL via SUBCUTANEOUS

## 2020-10-08 SURGICAL SUPPLY — 10 items
CATH INFINITI 5FR MULTPACK ANG (CATHETERS) ×1 IMPLANT
CLOSURE MYNX CONTROL 5F (Vascular Products) ×1 IMPLANT
GLIDESHEATH SLEND SS 6F .021 (SHEATH) IMPLANT
KIT HEART LEFT (KITS) ×2 IMPLANT
MAT PREVALON FULL STRYKER (MISCELLANEOUS) ×1 IMPLANT
PACK CARDIAC CATHETERIZATION (CUSTOM PROCEDURE TRAY) ×2 IMPLANT
SHEATH PINNACLE 5F 10CM (SHEATH) ×1 IMPLANT
TRANSDUCER W/STOPCOCK (MISCELLANEOUS) ×2 IMPLANT
TUBING CIL FLEX 10 FLL-RA (TUBING) ×2 IMPLANT
WIRE EMERALD 3MM-J .035X150CM (WIRE) ×1 IMPLANT

## 2020-10-08 NOTE — Progress Notes (Signed)
Progress Note  Patient Name: [name] Date of Encounter: 10/08/2020  [facility] Cardiologist: Jenne Campus, MD   Subjective   No chest pain or shortness of breath.  Headache is improving.  Inpatient Medications    Scheduled Meds:  aspirin  81 mg Oral Daily   atorvastatin  10 mg Oral QHS   busPIRone  15 mg Oral TID   carvedilol  6.25 mg Oral BID WC   diazepam  10 mg Oral QID   [START ON 10/09/2020] enoxaparin (LOVENOX) injection  40 mg Subcutaneous Q24H   famotidine  20 mg Oral BID   fluticasone furoate-vilanterol  1 puff Inhalation Daily   glipiZIDE  10 mg Oral Q breakfast   montelukast  10 mg Oral Daily   QUEtiapine  100 mg Oral QHS   rOPINIRole  1 mg Oral QHS   sodium chloride flush  3 mL Intravenous Q12H   sodium chloride flush  3 mL Intravenous Q12H   venlafaxine XR  150 mg Oral Q breakfast   Continuous Infusions:  sodium chloride     sodium chloride     PRN Meds: sodium chloride, albuterol, EPINEPHrine, fluticasone, hydrALAZINE, methocarbamol, nitroGLYCERIN, ondansetron (ZOFRAN) IV, sodium chloride flush   Vital Signs    Vitals:   10/07/20 2035 10/08/20 0500 10/08/20 0748 10/08/20 0840  BP: 110/76 112/69    Pulse:  91 61   Resp:  18 16   Temp: 97.8 F (36.6 C) 98.5 F (36.9 C)    TempSrc: Axillary Oral    SpO2:  95% 98% 100%  Weight:      Height:        Intake/Output Summary (Last 24 hours) at 10/08/2020 1137 Last data filed at 10/08/2020 0700 Gross per 24 hour  Intake 665.73 ml  Output --  Net 665.73 ml   Last 3 Weights 10/07/2020 10/06/2020 12/16/2019  Weight (lbs) 211 lb 8 oz 207 lb 207 lb  Weight (kg) 95.936 kg 93.895 kg 93.895 kg       Physical Exam  Alert, oriented, obese woman in NAD GEN: No acute distress.   Neck: No JVD Cardiac: RRR, no murmurs, rubs, or gallops.  Respiratory: Clear to auscultation bilaterally. GI: Soft, nontender, non-distended  MS: No edema; No deformity.  Right groin site clear, Mynx dressing is in place.  No  hematoma or ecchymosis. Neuro:  Nonfocal  Psych: Normal affect   Labs    High Sensitivity Troponin:   Recent Labs  Lab 10/06/20 0114 10/06/20 0356 10/06/20 0815 10/06/20 1015  TROPONINIHS 176* 239* 278* 280*      Chemistry Recent Labs  Lab 10/06/20 0114  NA 140  K 3.5  CL 104  CO2 27  GLUCOSE 81  BUN 7  CREATININE 0.75  CALCIUM 8.8*  PROT 6.6  ALBUMIN 3.5  AST 21  ALT 18  ALKPHOS 81  BILITOT 0.4  GFRNONAA >60  ANIONGAP 9     Hematology Recent Labs  Lab 10/06/20 0114 10/07/20 0153 10/08/20 0216  WBC 7.2 6.8 8.0  RBC 4.49 3.88 3.74*  HGB 13.4 11.6* 11.2*  HCT 42.8 37.2 35.4*  MCV 95.3 95.9 94.7  MCH 29.8 29.9 29.9  MCHC 31.3 31.2 31.6  RDW 14.7 14.7 14.6  PLT 374 321 302    BNP Recent Labs  Lab 10/06/20 0114  BNP 6.2     DDimer No results for input(s): DDIMER in the last 168 hours.   Radiology    CARDIAC CATHETERIZATION  Result Date: 10/08/2020  Ost LAD lesion is 25% stenosed.  LV end diastolic pressure is mildly elevated.  The left ventricular systolic function is normal.  The left ventricular ejection fraction is 55-65% by visual estimate.  1. No obstructive CAD. Unchanged from 2020. 2. Normal LV function 3. Mildly elevated LVEDP Plan: recommend medical therapy. Clinical scenario may be related to coronary vasospasm or microvascular disease.   ECHOCARDIOGRAM COMPLETE  Result Date: 10/06/2020    ECHOCARDIOGRAM REPORT   Patient Name:   [name] Date of Exam: 10/06/2020 Medical Rec #:  [id number]   Height:       59.0 in Accession #:    [id number]  Weight:       207.0 lb Date of Birth:  02/19/1966  BSA:          1.872 m Patient Age:    55 years    BP:           119/82 mmHg Patient Gender: F           HR:           88 bpm. Exam Location:  Inpatient Procedure: 2D Echo Indications:    acute ischemic heart disease  History:        Patient has prior history of Echocardiogram examinations, most                 recent 02/09/2019. PFO; Risk  Factors:Hypertension.  Sonographer:    Johny Chess Referring Phys: [id number] JACOB M MATHEW  Sonographer Comments: Suboptimal subcostal window. Image acquisition challenging due to patient body habitus. IMPRESSIONS  1. Left ventricular ejection fraction, by estimation, is 55 to 60%. The left ventricle has normal function. The left ventricle has no regional wall motion abnormalities. Left ventricular diastolic parameters are consistent with Grade I diastolic dysfunction (impaired relaxation).  2. Right ventricular systolic function is normal. The right ventricular size is normal.  3. The mitral valve is grossly normal. No evidence of mitral valve regurgitation.  4. The aortic valve is tricuspid. Aortic valve regurgitation is not visualized.  5. The inferior vena cava is normal in size with greater than 50% respiratory variability, suggesting right atrial pressure of 3 mmHg. Comparison(s): 02/09/2019: LVEF 45-50%. FINDINGS  Left Ventricle: Left ventricular ejection fraction, by estimation, is 55 to 60%. The left ventricle has normal function. The left ventricle has no regional wall motion abnormalities. The left ventricular internal cavity size was normal in size. There is  no left ventricular hypertrophy. Left ventricular diastolic parameters are consistent with Grade I diastolic dysfunction (impaired relaxation). Indeterminate filling pressures. Right Ventricle: The right ventricular size is normal. No increase in right ventricular wall thickness. Right ventricular systolic function is normal. Left Atrium: Left atrial size was normal in size. Right Atrium: Right atrial size was normal in size. Pericardium: There is no evidence of pericardial effusion. Mitral Valve: The mitral valve is grossly normal. No evidence of mitral valve regurgitation. Tricuspid Valve: The tricuspid valve is grossly normal. Tricuspid valve regurgitation is not demonstrated. Aortic Valve: The aortic valve is tricuspid. Aortic valve  regurgitation is not visualized. Pulmonic Valve: The pulmonic valve was normal in structure. Pulmonic valve regurgitation is not visualized. Aorta: The aortic root and ascending aorta are structurally normal, with no evidence of dilitation. Venous: The inferior vena cava is normal in size with greater than 50% respiratory variability, suggesting right atrial pressure of 3 mmHg. IAS/Shunts: The interatrial septum was not well visualized.  LEFT VENTRICLE PLAX 2D LVIDd:  4.30 cm Diastology LVIDs:         2.80 cm LV e' medial:    4.90 cm/s LV PW:         1.10 cm LV E/e' medial:  12.8 LV IVS:        1.00 cm LV e' lateral:   6.85 cm/s                        LV E/e' lateral: 9.1  RIGHT VENTRICLE RV S prime:     15.00 cm/s TAPSE (M-mode): 1.6 cm LEFT ATRIUM           Index       RIGHT ATRIUM          Index LA diam:      2.80 cm 1.50 cm/m  RA Area:     8.03 cm LA Vol (A4C): 29.1 ml 15.55 ml/m RA Volume:   14.20 ml 7.59 ml/m  AORTIC VALVE LVOT Vmax:   70.70 cm/s LVOT Vmean:  46.700 cm/s LVOT VTI:    0.120 m  AORTA Ao Asc diam: 2.90 cm MITRAL VALVE MV Area (PHT): 3.89 cm    SHUNTS MV Decel Time: 195 msec    Systemic VTI: 0.12 m MV E velocity: 62.60 cm/s MV A velocity: 65.60 cm/s MV E/A ratio:  0.95 Lyman Bishop MD Electronically signed by Lyman Bishop MD Signature Date/Time: 10/06/2020/4:35:38 PM    Final     Cardiac Studies   Cardiac catheterization and 2D echo results are reviewed as outlined above.  Patient Profile     55 y.o. 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  The patient's echo and cardiac catheterization studies are reassuring.  She may have microvascular dysfunction or coronary vasospasm.  Recommend to add amlodipine 5 mg daily.  Otherwise continue current medical program.  Other problems appear stable, see discharge summary for details.  I think the patient is medically stable for hospital  discharge today.  Her husband is at the bedside.  We discussed post cardiac catheterization restrictions.  For questions or updates, please contact [place] Please consult [email or web address] for contact info under        Signed, Sherren Mocha, MD  10/08/2020, 11:37 AM

## 2020-10-08 NOTE — Progress Notes (Signed)
[place] for Heparin Indication: chest pain/ACS  Allergies  Allergen Reactions   Banana Shortness Of Breath and Swelling   Latex Shortness Of Breath   Lisinopril-Hydrochlorothiazide Swelling    Swelling (had epi by ems and required ED visit)   Onion Shortness Of Breath and Swelling   Penicillins Anaphylaxis, Swelling and Other (See Comments)    Has patient had a PCN reaction causing immediate rash, facial/tongue/throat swelling, SOB or lightheadedness with hypotension: Yes Has patient had a PCN reaction causing severe rash involving mucus membranes or skin necrosis: No Has patient had a PCN reaction that required hospitalization: Yes - inpatient Has patient had a PCN reaction occurring within the last 10 years: No If all of the above answers are "NO", then may proceed with Cephalosporin use.    Tylenol [Acetaminophen] Other (See Comments)    Throat swelling    Codeine Swelling   Hydrocodone Hives and Swelling   Insect Wax     Any Insect that bites    Patient Measurements: Height: 4\' 11"  (149.9 cm) Weight: 95.9 kg (211 lb 8 oz) IBW/kg (Calculated) : 43.2 Heparin Dosing Weight: 65 kg  Vital Signs: Temp: 98.5 F (36.9 C) (07/05 0500) Temp Source: Oral (07/05 0500) BP: 112/69 (07/05 0500) Pulse Rate: 91 (07/05 0500)  Labs: Recent Labs    10/06/20 0114 10/06/20 0356 10/06/20 0815 10/06/20 1015 10/06/20 1200 10/06/20 2210 10/07/20 0153 10/08/20 0216  HGB 13.4  --   --   --   --   --  11.6* 11.2*  HCT 42.8  --   --   --   --   --  37.2 35.4*  PLT 374  --   --   --   --   --  321 302  HEPARINUNFRC  --   --   --   --    < > 0.71* 0.52 0.38  CREATININE 0.75  --   --   --   --   --   --   --   TROPONINIHS 176* 239* 278* 280*  --   --   --   --    < > = values in this interval not displayed.   Estimated Creatinine Clearance: 79.7 mL/min (by C-G formula based on SCr of 0.75 mg/dL).   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 with allergies/sinus issues   History of colon polyps 04/2016   History of MRSA infection 2007   Hypertension    no problems since weight loss -'09   Multiple food allergies    Latex allergy-respiratory   Obesity    Tendonitis    Vitamin D deficiency    history of     Assessment: 55 y.o. female with chest pain. No anticoagulation prior to admission. Pharmacy consulted for heparin.    Heparin level down to 0.38 IU/mL but continues to be therapeutic. H/H drop likely due to dilution as was stable this am. No noted bleeding. Cath planned for today 7/5.    Goal of Therapy:  Heparin level 0.3-0.7 units/ml Monitor platelets by anticoagulation  protocol: Yes   Plan:  Continue heparin infusion at 900 units/h Monitor daily HL, CBC/plt Monitor for signs/symptoms of bleeding  F/u cath  Erin Hearing PharmD., BCPS Clinical Pharmacist 10/08/2020 7:32 AM

## 2020-10-08 NOTE — Interval H&P Note (Signed)
History and Physical Interval Note:  10/08/2020 8:25 AM  [name]  has presented today for surgery, with the diagnosis of NSTEMI.  The various methods of treatment have been discussed with the patient and family. After consideration of risks, benefits and other options for treatment, the patient has consented to  Procedure(s): LEFT HEART CATH AND CORONARY ANGIOGRAPHY (N/A) as a surgical intervention.  The patient's history has been reviewed, patient examined, no change in status, stable for surgery.  I have reviewed the patient's chart and labs.  Questions were answered to the patient's satisfaction.   Cath Lab Visit (complete for each Cath Lab visit)  Clinical Evaluation Leading to the Procedure:   ACS: Yes.    Non-ACS:    Anginal Classification: CCS IV  Anti-ischemic medical therapy: Minimal Therapy (1 class of medications)  Non-Invasive Test Results: No non-invasive testing performed  Prior CABG: No previous CABG        [name] 10/08/2020 8:25 AM

## 2020-10-08 NOTE — Discharge Summary (Signed)
Discharge Summary    Patient ID: [name] MRN: [id number]; DOB: 04-30-1965  Admit date: 10/06/2020 Discharge date: 10/08/2020  PCP:  Bernerd Limbo, MD   [facility] Providers Cardiologist:  Jenne Campus, MD   {  Discharge Diagnoses    Principal Problem:   NSTEMI (non-ST elevated myocardial infarction) [facility]) Active Problems:   Essential hypertension   Unstable angina [facility])  Diagnostic Studies/Procedures    Echo: 10/06/20  IMPRESSIONS     1. Left ventricular ejection fraction, by estimation, is 55 to 60%. The  left ventricle has normal function. The left ventricle has no regional  wall motion abnormalities. Left ventricular diastolic parameters are  consistent with Grade I diastolic  dysfunction (impaired relaxation).   2. Right ventricular systolic function is normal. The right ventricular  size is normal.   3. The mitral valve is grossly normal. No evidence of mitral valve  regurgitation.   4. The aortic valve is tricuspid. Aortic valve regurgitation is not  visualized.   5. The inferior vena cava is normal in size with greater than 50%  respiratory variability, suggesting right atrial pressure of 3 mmHg.   Comparison(s): 02/09/2019: LVEF 45-50%.   Cath: 10/08/20   Ost LAD lesion is 25% stenosed. LV end diastolic pressure is mildly elevated. The left ventricular systolic function is normal. The left ventricular ejection fraction is 55-65% by visual estimate.   1. No obstructive CAD. Unchanged from 2020. 2. Normal LV function 3. Mildly elevated LVEDP   Plan: recommend medical therapy. Clinical scenario may be related to coronary vasospasm or microvascular disease. _____________   History of Present Illness     [name] is a 55 y.o. female with PMH of HTN, DM, asthma was evaluated for NSTEMI.  Pt complained of lower chest and abdominal pain with associated shortness of breath onset 6 months ago. She stated that the day prior to admission she had been  having chest heaviness. Denied any palpitations of LOC. Patient reported that this has been ongoing for her. Stated has "elephant sitting on chest" . She wears oxygen at home.  3 L via nasal cannula.   She additionally had nausea, dry heaves, worsening shortness of breath and bilateral paresthesias of both upper extremities.  Denied syncope or near syncope.  Denied swelling of her legs the day of admission but reported they were somewhat swollen the day prior. Reported compliance with medications. She had chest pain previously with cath showing non obstructive disease about 20 months ago. She also had a positive stress test at that time. At time of interview, she was comfortable with no significant chest pain. Her heparin and NTG are infusing. She has OSA and uses BiPAP. She was admitted for further work up.   Hospital Course     Patient underwent cardiac catheterization noted above with nonobstructive CAD.  Echocardiogram showed EF of 55 to 60% with no regional wall motion abnormality, grade 1 diastolic dysfunction. hsTn peaked at 280. It was felt that she may have microvascular dysfunction or coronary vasospasm, therefore recommended adding amlodipine 5 mg daily.  Otherwise she was continued on her home medication regimen without significant change.  The patient was seen by Dr. Burt Knack and deemed stable for discharge.  Follow-up in the office has been arranged.  Did the patient have an acute coronary syndrome (MI, NSTEMI, STEMI, etc) this admission?:  No  Did the patient have a percutaneous coronary intervention (stent / angioplasty)?:  No.       _____________  Discharge Vitals Blood pressure 112/69, pulse 61, temperature 98.5 F (36.9 C), temperature source Oral, resp. rate 16, height 4\' 11"  (1.499 m), weight 95.9 kg, SpO2 100 %.  Filed Weights   10/06/20 0101 10/07/20 0638  Weight: 93.9 kg 95.9 kg    Labs & Radiologic Studies    CBC Recent Labs     10/06/20 0114 10/07/20 0153 10/08/20 0216  WBC 7.2 6.8 8.0  NEUTROABS 3.2  --   --   HGB 13.4 11.6* 11.2*  HCT 42.8 37.2 35.4*  MCV 95.3 95.9 94.7  PLT 374 321 829   Basic Metabolic Panel Recent Labs    10/06/20 0114  NA 140  K 3.5  CL 104  CO2 27  GLUCOSE 81  BUN 7  CREATININE 0.75  CALCIUM 8.8*   Liver Function Tests Recent Labs    10/06/20 0114  AST 21  ALT 18  ALKPHOS 81  BILITOT 0.4  PROT 6.6  ALBUMIN 3.5   Recent Labs    10/06/20 0114  LIPASE 34   High Sensitivity Troponin:   Recent Labs  Lab 10/06/20 0114 10/06/20 0356 10/06/20 0815 10/06/20 1015  TROPONINIHS 176* 239* 278* 280*    BNP Invalid input(s): POCBNP D-Dimer No results for input(s): DDIMER in the last 72 hours. Hemoglobin A1C No results for input(s): HGBA1C in the last 72 hours. Fasting Lipid Panel Recent Labs    10/07/20 0153  CHOL 117  HDL 42  LDLCALC 61  TRIG 69  CHOLHDL 2.8   Thyroid Function Tests No results for input(s): TSH, T4TOTAL, T3FREE, THYROIDAB in the last 72 hours.  Invalid input(s): FREET3 _____________  DG Chest 2 View  Result Date: 10/06/2020 CLINICAL DATA:  Chest pain for 6 months. Shortness of breath and headache. EXAM: CHEST - 2 VIEW COMPARISON:  02/19/2019 FINDINGS: Heart size and pulmonary vascularity are normal. Shallow inspiration with linear atelectasis in the lung bases. No pleural effusions. No pneumothorax. Mediastinal contours appear intact. Thoracolumbar scoliosis and degenerative changes. IMPRESSION: Shallow inspiration with atelectasis in the lung bases. Electronically Signed   By: Lucienne Capers M.D.   On: 10/06/2020 03:24   CARDIAC CATHETERIZATION  Result Date: 10/08/2020  Ost LAD lesion is 25% stenosed.  LV end diastolic pressure is mildly elevated.  The left ventricular systolic function is normal.  The left ventricular ejection fraction is 55-65% by visual estimate.  1. No obstructive CAD. Unchanged from 2020. 2. Normal LV function  3. Mildly elevated LVEDP Plan: recommend medical therapy. Clinical scenario may be related to coronary vasospasm or microvascular disease.   ECHOCARDIOGRAM COMPLETE  Result Date: 10/06/2020    ECHOCARDIOGRAM REPORT   Patient Name:   [name] Date of Exam: 10/06/2020 Medical Rec #:  [id number]   Height:       59.0 in Accession #:    [id number]  Weight:       207.0 lb Date of Birth:  [birth year]  BSA:          1.872 m Patient Age:    71 years    BP:           119/82 mmHg Patient Gender: F           HR:           88 bpm. Exam Location:  Inpatient Procedure: 2D Echo Indications:    acute ischemic heart disease  History:        Patient has prior history of Echocardiogram examinations, most                 recent 02/09/2019. PFO; Risk Factors:Hypertension.  Sonographer:    Johny Chess Referring Phys: [id number] JACOB M MATHEW  Sonographer Comments: Suboptimal subcostal window. Image acquisition challenging due to patient body habitus. IMPRESSIONS  1. Left ventricular ejection fraction, by estimation, is 55 to 60%. The left ventricle has normal function. The left ventricle has no regional wall motion abnormalities. Left ventricular diastolic parameters are consistent with Grade I diastolic dysfunction (impaired relaxation).  2. Right ventricular systolic function is normal. The right ventricular size is normal.  3. The mitral valve is grossly normal. No evidence of mitral valve regurgitation.  4. The aortic valve is tricuspid. Aortic valve regurgitation is not visualized.  5. The inferior vena cava is normal in size with greater than 50% respiratory variability, suggesting right atrial pressure of 3 mmHg. Comparison(s): 02/09/2019: LVEF 45-50%. FINDINGS  Left Ventricle: Left ventricular ejection fraction, by estimation, is 55 to 60%. The left ventricle has normal function. The left ventricle has no regional wall motion abnormalities. The left ventricular internal cavity size was normal in size. There is  no left  ventricular hypertrophy. Left ventricular diastolic parameters are consistent with Grade I diastolic dysfunction (impaired relaxation). Indeterminate filling pressures. Right Ventricle: The right ventricular size is normal. No increase in right ventricular wall thickness. Right ventricular systolic function is normal. Left Atrium: Left atrial size was normal in size. Right Atrium: Right atrial size was normal in size. Pericardium: There is no evidence of pericardial effusion. Mitral Valve: The mitral valve is grossly normal. No evidence of mitral valve regurgitation. Tricuspid Valve: The tricuspid valve is grossly normal. Tricuspid valve regurgitation is not demonstrated. Aortic Valve: The aortic valve is tricuspid. Aortic valve regurgitation is not visualized. Pulmonic Valve: The pulmonic valve was normal in structure. Pulmonic valve regurgitation is not visualized. Aorta: The aortic root and ascending aorta are structurally normal, with no evidence of dilitation. Venous: The inferior vena cava is normal in size with greater than 50% respiratory variability, suggesting right atrial pressure of 3 mmHg. IAS/Shunts: The interatrial septum was not well visualized.  LEFT VENTRICLE PLAX 2D LVIDd:         4.30 cm Diastology LVIDs:         2.80 cm LV e' medial:    4.90 cm/s LV PW:         1.10 cm LV E/e' medial:  12.8 LV IVS:        1.00 cm LV e' lateral:   6.85 cm/s                        LV E/e' lateral: 9.1  RIGHT VENTRICLE RV S prime:     15.00 cm/s TAPSE (M-mode): 1.6 cm LEFT ATRIUM           Index       RIGHT ATRIUM          Index LA diam:      2.80 cm 1.50 cm/m  RA Area:     8.03 cm LA Vol (A4C): 29.1 ml 15.55 ml/m RA Volume:   14.20 ml 7.59 ml/m  AORTIC VALVE LVOT Vmax:   70.70 cm/s LVOT Vmean:  46.700 cm/s LVOT VTI:    0.120 m  AORTA Ao Asc diam: 2.90 cm MITRAL VALVE MV Area (PHT): 3.89 cm  SHUNTS MV Decel Time: 195 msec    Systemic VTI: 0.12 m MV E velocity: 62.60 cm/s MV A velocity: 65.60 cm/s MV E/A  ratio:  0.95 Lyman Bishop MD Electronically signed by Lyman Bishop MD Signature Date/Time: 10/06/2020/4:35:38 PM    Final    Disposition   Pt is being discharged home today in good condition.  Follow-up Plans & Appointments     Follow-up Information     Park Liter, MD Follow up on 11/11/2020.   Specialty: Cardiology Why: at 10am for your follow up appt Contact information: [place] [id number] [phone]                Discharge Instructions     Call MD for:  redness, tenderness, or signs of infection (pain, swelling, redness, odor or green/yellow discharge around incision site)   Complete by: As directed    Diet - low sodium heart healthy   Complete by: As directed    Discharge instructions   Complete by: As directed    Radial Site Care Refer to this sheet in the next few weeks. These instructions provide you with information on caring for yourself after your procedure. Your caregiver may also give you more specific instructions. Your treatment has been planned according to current medical practices, but problems sometimes occur. Call your caregiver if you have any problems or questions after your procedure. HOME CARE INSTRUCTIONS You may shower the day after the procedure. Remove the bandage (dressing) and gently wash the site with plain soap and water. Gently pat the site dry.  Do not apply powder or lotion to the site.  Do not submerge the affected site in water for 3 to 5 days.  Inspect the site at least twice daily.  Do not flex or bend the affected arm for 24 hours.  No lifting over 5 pounds (2.3 kg) for 5 days after your procedure.  Do not drive home if you are discharged the same day of the procedure. Have someone else drive you.  You may drive 24 hours after the procedure unless otherwise instructed by your caregiver.  What to expect: Any bruising will usually fade within 1 to 2 weeks.  Blood that collects in the tissue  (hematoma) may be painful to the touch. It should usually decrease in size and tenderness within 1 to 2 weeks.  SEEK IMMEDIATE MEDICAL CARE IF: You have unusual pain at the radial site.  You have redness, warmth, swelling, or pain at the radial site.  You have drainage (other than a small amount of blood on the dressing).  You have chills.  You have a fever or persistent symptoms for more than 72 hours.  You have a fever and your symptoms suddenly get worse.  Your arm becomes pale, cool, tingly, or numb.  You have heavy bleeding from the site. Hold pressure on the site.   Increase activity slowly   Complete by: As directed        Discharge Medications   Allergies as of 10/08/2020       Reactions   Banana Shortness Of Breath, Swelling   Latex Shortness Of Breath   Lisinopril-hydrochlorothiazide Swelling   Swelling (had epi by ems and required ED visit)   Onion Shortness Of Breath, Swelling   Penicillins Anaphylaxis, Swelling, Other (See Comments)   Has patient had a PCN reaction causing immediate rash, facial/tongue/throat swelling, SOB or lightheadedness with hypotension: Yes Has patient had a PCN reaction  causing severe rash involving mucus membranes or skin necrosis: No Has patient had a PCN reaction that required hospitalization: Yes - inpatient Has patient had a PCN reaction occurring within the last 10 years: No If all of the above answers are "NO", then may proceed with Cephalosporin use.   Tylenol [acetaminophen] Other (See Comments)   Throat swelling    Codeine Swelling   Hydrocodone Hives, Swelling   Insect Wax    Any Insect that bites        Medication List     STOP taking these medications    albuterol 108 (90 Base) MCG/ACT inhaler Commonly known as: VENTOLIN HFA   Breo Ellipta 200-25 MCG/INH Aepb Generic drug: fluticasone furoate-vilanterol       TAKE these medications    amLODipine 5 MG tablet Commonly known as: NORVASC Take 1 tablet (5 mg total)  by mouth daily.   aspirin 81 MG chewable tablet Chew 81 mg by mouth daily.   atorvastatin 10 MG tablet Commonly known as: LIPITOR Take 10 mg by mouth at bedtime.   busPIRone 15 MG tablet Commonly known as: BUSPAR Take 15 mg by mouth 2 (two) times daily.   calcium carbonate 1250 (500 Ca) MG tablet Commonly known as: OS-CAL - dosed in mg of elemental calcium Take 1 tablet by mouth daily with breakfast.   carvedilol 6.25 MG tablet Commonly known as: COREG Take 1 tablet (6.25 mg total) by mouth 2 (two) times daily with a meal.   cholecalciferol 25 MCG (1000 UNIT) tablet Commonly known as: VITAMIN D3 Take 1,000 Units by mouth daily.   cyclobenzaprine 10 MG tablet Commonly known as: FLEXERIL Take 10 mg by mouth at bedtime as needed for muscle spasms.   desvenlafaxine 100 MG 24 hr tablet Commonly known as: PRISTIQ Take 100 mg by mouth daily.   diazepam 10 MG tablet Commonly known as: VALIUM Take 10 mg by mouth 4 (four) times daily.   EPINEPHrine 0.3 mg/0.3 mL Soaj injection Commonly known as: EPI-PEN Inject 0.3 mg into the muscle as needed for anaphylaxis. What changed: Another medication with the same name was removed. Continue taking this medication, and follow the directions you see here.   famotidine 20 MG tablet Commonly known as: Pepcid Take 1 tablet (20 mg total) by mouth 2 (two) times daily.   fluticasone 50 MCG/ACT nasal spray Commonly known as: FLONASE Place 2 sprays into both nostrils daily as needed for allergies.   furosemide 20 MG tablet Commonly known as: LASIX Take 20 mg by mouth daily.   glipiZIDE 10 MG 24 hr tablet Commonly known as: GLUCOTROL XL Take 10 mg by mouth daily.   montelukast 10 MG tablet Commonly known as: SINGULAIR Take 10 mg by mouth daily.   omeprazole 20 MG capsule Commonly known as: PRILOSEC Take 20 mg by mouth daily.   OXYGEN Inhale 3 L into the lungs continuous.   Ozempic (0.25 or 0.5 MG/DOSE) 2 MG/1.5ML Sopn Generic  drug: Semaglutide(0.25 or 0.5MG /DOS) 1 mg by Subconjunctival route See admin instructions. On Tuesdays   polyethylene glycol 17 g packet Commonly known as: MIRALAX / GLYCOLAX Take 17 g by mouth daily.   QUEtiapine 200 MG tablet Commonly known as: SEROQUEL Take 200 mg by mouth at bedtime.   raloxifene 60 MG tablet Commonly known as: EVISTA Take 60 mg by mouth daily.   rOPINIRole 2 MG tablet Commonly known as: REQUIP Take 2 mg by mouth at bedtime.  Outstanding Labs/Studies   N/a   Duration of Discharge Encounter   Greater than 30 minutes including physician time.  Signed, Reino Bellis, NP 10/08/2020, 1:42 PM

## 2020-11-04 DIAGNOSIS — Z91018 Allergy to other foods: Secondary | ICD-10-CM | POA: Insufficient documentation

## 2020-11-04 DIAGNOSIS — M1990 Unspecified osteoarthritis, unspecified site: Secondary | ICD-10-CM | POA: Insufficient documentation

## 2020-11-04 DIAGNOSIS — E559 Vitamin D deficiency, unspecified: Secondary | ICD-10-CM | POA: Insufficient documentation

## 2020-11-04 DIAGNOSIS — D242 Benign neoplasm of left breast: Secondary | ICD-10-CM | POA: Insufficient documentation

## 2020-11-04 DIAGNOSIS — M779 Enthesopathy, unspecified: Secondary | ICD-10-CM | POA: Insufficient documentation

## 2020-11-04 DIAGNOSIS — H40059 Ocular hypertension, unspecified eye: Secondary | ICD-10-CM | POA: Insufficient documentation

## 2020-11-05 ENCOUNTER — Other Ambulatory Visit: Payer: Self-pay | Admitting: Cardiology

## 2020-11-11 ENCOUNTER — Ambulatory Visit: Payer: Medicare Other | Admitting: Cardiology

## 2020-11-19 ENCOUNTER — Telehealth: Payer: Self-pay | Admitting: Cardiology

## 2020-11-19 NOTE — Telephone Encounter (Signed)
Patient calling to request her appointment be virtual on Friday, because her grandchild came home with a runny nose and she does not want to bring it to the office.

## 2020-11-19 NOTE — Telephone Encounter (Signed)
Will forward this request to Dr. Agustin Cree and covering RN that day, for further advisement if pt is suitable to be a virtual visit that day, or reschedule office visit to a later date. Her follow-up visit is for hospital follow-up.

## 2020-11-19 NOTE — Telephone Encounter (Signed)
Terria, Mathes - 11/19/2020  1:23 PM Park Liter, MD   Tue November 19, 2020  2:59 PM    Message  I am fine either way virtual visit or postpone the visit is fine    Left the pt a message to call the office back to endorse that Dr. Agustin Cree is fine with seeing her as a virtual visit or reschedule office visit to a later date.

## 2020-11-20 NOTE — Telephone Encounter (Signed)
Spoke to the patient just now and got her scheduled for a virtual visit with Dr. Agustin Cree  on Friday.

## 2020-11-21 ENCOUNTER — Other Ambulatory Visit: Payer: Self-pay

## 2020-11-21 DIAGNOSIS — E669 Obesity, unspecified: Secondary | ICD-10-CM | POA: Insufficient documentation

## 2020-11-22 ENCOUNTER — Encounter: Payer: Self-pay | Admitting: Cardiology

## 2020-11-22 ENCOUNTER — Telehealth (INDEPENDENT_AMBULATORY_CARE_PROVIDER_SITE_OTHER): Payer: Medicare Other | Admitting: Cardiology

## 2020-11-22 VITALS — BP 124/84 | HR 98 | Ht 59.0 in | Wt 205.0 lb

## 2020-11-22 DIAGNOSIS — G4733 Obstructive sleep apnea (adult) (pediatric): Secondary | ICD-10-CM

## 2020-11-22 DIAGNOSIS — E119 Type 2 diabetes mellitus without complications: Secondary | ICD-10-CM

## 2020-11-22 DIAGNOSIS — Q2112 Patent foramen ovale: Secondary | ICD-10-CM

## 2020-11-22 DIAGNOSIS — I214 Non-ST elevation (NSTEMI) myocardial infarction: Secondary | ICD-10-CM

## 2020-11-22 DIAGNOSIS — Q211 Atrial septal defect: Secondary | ICD-10-CM

## 2020-11-22 MED ORDER — RANOLAZINE ER 500 MG PO TB12: 500.0000 mg | ORAL_TABLET | Freq: Two times a day (BID) | ORAL | 3 refills | Status: DC

## 2020-11-22 NOTE — Patient Instructions (Signed)
Medication Instructions:  Your physician has recommended you make the following change in your medication:  START: Ranolazine 500 mg take one tablet by mouth twice daily.  *If you need a refill on your cardiac medications before your next appointment, please call your pharmacy*   Lab Work: None If you have labs (blood work) drawn today and your tests are completely normal, you will receive your results only by: [place] (if you have MyChart) OR A paper copy in the mail If you have any lab test that is abnormal or we need to change your treatment, we will call you to review the results.   Testing/Procedures: None   Follow-Up: At [facility], you and your health needs are our priority.  As part of our continuing mission to provide you with exceptional heart care, we have created designated Provider Care Teams.  These Care Teams include your primary Cardiologist (physician) and Advanced Practice Providers (APPs -  Physician Assistants and Nurse Practitioners) who all work together to provide you with the care you need, when you need it.  We recommend signing up for the patient portal called "MyChart".  Sign up information is provided on this After Visit Summary.  MyChart is used to connect with patients for Virtual Visits (Telemedicine).  Patients are able to view lab/test results, encounter notes, upcoming appointments, etc.  Non-urgent messages can be sent to your provider as well.   To learn more about what you can do with MyChart, go to [email or web address].    Your next appointment:   3 month(s)  The format for your next appointment:   In Person  Provider:   Jenne Campus, MD   Other Instructions

## 2020-11-22 NOTE — Progress Notes (Signed)
Virtual Visit via Telephone Note   This visit type was conducted due to national recommendations for restrictions regarding the COVID-19 Pandemic (e.g. social distancing) in an effort to limit this patient's exposure and mitigate transmission in our community.  Due to her co-morbid illnesses, this patient is at least at moderate risk for complications without adequate follow up.  This format is felt to be most appropriate for this patient at this time.  The patient did not have access to video technology/had technical difficulties with video requiring transitioning to audio format only (telephone).  All issues noted in this document were discussed and addressed.  No physical exam could be performed with this format.  Please refer to the patient's chart for her  consent to telehealth for [facility].  Evaluation Performed:  Follow-up visit  This visit type was conducted due to national recommendations for restrictions regarding the COVID-19 Pandemic (e.g. social distancing).  This format is felt to be most appropriate for this patient at this time.  All issues noted in this document were discussed and addressed.  No physical exam was performed (except for noted visual exam findings with Video Visits).  Please refer to the patient's chart (MyChart message for video visits and phone note for telephone visits) for the patient's consent to telehealth for [facility].  Date:  11/22/2020  ID: [name], DOB April 23, 1965, MRN [id number]   Patient Location: [place] [id number]   Provider location:   [place] [facility]  PCP:  Bernerd Limbo, MD  Cardiologist:  Jenne Campus, MD     Chief Complaint: I am doing better  History of Present Illness:    [name] is a 55 y.o. female  who presents via audio/video conferencing for a telehealth visit today.  With past medical history significant for essential hypertension, diabetes, obesity.  Recently she ended  up going to the hospital because of chest pain.  She ruled in for myocardial infarction with minimal elevation of troponin I.  Cardiac catheterization has been performed which find only 25% lesion in proximal LAD.  Conclusion was that she did have vasospasm may be small branch that got occluded.  She was put on appropriate medications discharged home.  She was scheduled to have visits with me in the office however if she feels sick her daughter is sick that is why she decided to do virtual visit.  Sadly, we were unable to establish video link therefore we only talk on the phone.  She said today is Thursday that she start feeling better.  She denies have any chest pain tightness squeezing pressure burning chest months since she is being discharged from the hospital she got 2 episode of chest pain for which she took nitroglycerin with good relief.   The patient does not have symptoms concerning for COVID-19 infection (fever, chills, cough, or new SHORTNESS OF BREATH).    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 with allergies/sinus issues  History of colon polyps 04/2016   History of MRSA infection 2007   Hypertension    no problems since weight loss -'09   Multiple food allergies    Latex allergy-respiratory   Obesity    Tendonitis    Vitamin D deficiency    history of    Past Surgical History:  Procedure Laterality Date   ABDOMINAL HYSTERECTOMY     BOWEL RESECTION     CESAREAN SECTION     x4   COLONOSCOPY W/ POLYPECTOMY   04/2016   KNEE SURGERY     Right knee scope 8'12   LEFT HEART CATH AND CORONARY ANGIOGRAPHY N/A 02/13/2019   Procedure: LEFT HEART CATH AND CORONARY ANGIOGRAPHY;  Surgeon: Nelva Bush, MD;  Location: [facility];  Service: Cardiovascular;  Laterality: N/A;   LEFT HEART CATH AND CORONARY ANGIOGRAPHY N/A 10/08/2020   Procedure: LEFT HEART CATH AND CORONARY ANGIOGRAPHY;  Surgeon: Martinique, Peter M, MD;  Location: [facility];  Service: Cardiovascular;  Laterality: N/A;   TOTAL KNEE ARTHROPLASTY Right 01/31/2013   Procedure: RIGHT TOTAL KNEE ARTHROPLASTY;  Surgeon: [name], MD;  Location: [facility];  Service: Orthopedics;  Laterality: Right;   TOTAL KNEE ARTHROPLASTY Left 02/15/2018   Procedure: LEFT TOTAL KNEE ARTHROPLASTY;  Surgeon: Paralee Cancel, MD;  Location: [facility];  Service: Orthopedics;  Laterality: Left;  68     Current Meds  Medication Sig   amLODipine (NORVASC) 5 MG tablet TAKE 1 TABLET BY MOUTH ONCE DAILY (Patient taking differently: Take 5 mg by mouth daily.)   aspirin 81 MG chewable tablet Chew 81 mg by mouth daily.   atorvastatin (LIPITOR) 10 MG tablet Take 10 mg by mouth at bedtime.   BREO ELLIPTA 200-25 MCG/INH AEPB Inhale 1 puff into the lungs daily.   busPIRone (BUSPAR) 15 MG tablet Take 15 mg by mouth 2 (two) times daily.   calcium carbonate (OS-CAL - DOSED IN MG OF ELEMENTAL CALCIUM) 1250 (500 Ca) MG tablet Take 1 tablet by mouth daily with breakfast.   carvedilol (COREG) 6.25 MG tablet Take 1 tablet (6.25 mg total) by mouth 2 (two) times daily with a meal.   cholecalciferol (VITAMIN D3) 25 MCG (1000 UNIT) tablet Take 1,000 Units by mouth daily.   cyclobenzaprine (FLEXERIL) 10 MG tablet Take 10 mg by mouth at bedtime as needed for muscle spasms.   desvenlafaxine (PRISTIQ) 100 MG 24 hr tablet Take 100 mg by mouth daily.   diazepam (VALIUM) 10 MG tablet Take 10 mg by mouth 4 (four) times daily.   Dulaglutide 1.5 MG/0.5ML SOPN Inject 0.5 mLs into the skin  every 7 (seven) days.   EPINEPHrine 0.3 mg/0.3 mL IJ SOAJ injection Inject 0.3 mg into the muscle as needed for anaphylaxis.   famotidine (PEPCID) 20 MG tablet Take 1 tablet (20 mg total) by mouth 2 (two) times daily.   fluticasone (FLONASE) 50 MCG/ACT nasal spray Place 2 sprays into both nostrils daily as needed for allergies.    furosemide (LASIX) 20 MG tablet Take 10 mg by mouth daily.   Insulin Glargine (BASAGLAR KWIKPEN) 100 UNIT/ML Inject 16 Units into the skin daily.   montelukast (SINGULAIR) 10 MG tablet Take 10 mg by mouth daily.   nitroGLYCERIN (NITROSTAT) 0.3 MG SL tablet Place 0.3 mg under the tongue as needed for chest pain.   omeprazole (PRILOSEC) 20 MG capsule Take 20 mg by mouth daily.   OXYGEN Inhale 3 L into the lungs continuous.   polyethylene  glycol (MIRALAX / GLYCOLAX) 17 g packet Take 17 g by mouth daily.   QUEtiapine (SEROQUEL) 200 MG tablet Take 200 mg by mouth at bedtime.   raloxifene (EVISTA) 60 MG tablet Take 60 mg by mouth daily.   rOPINIRole (REQUIP) 2 MG tablet Take 2 mg by mouth at bedtime.      Family History: The patient's family history includes Diabetes in her father; Lung cancer in her mother. There is no history of Colon cancer.   ROS:   Please see the history of present illness.     All other systems reviewed and are negative.   Labs/Other Tests and Data Reviewed:     Recent Labs: 10/06/2020: ALT 18; B Natriuretic Peptide 6.2; BUN 7; Potassium 3.5; Sodium 140 10/08/2020: Creatinine, Ser 0.69; Hemoglobin 11.2; Platelets 302  Recent Lipid Panel    Component Value Date/Time   CHOL 117 10/07/2020 0153   TRIG 69 10/07/2020 0153   HDL 42 10/07/2020 0153   CHOLHDL 2.8 10/07/2020 0153   VLDL 14 10/07/2020 0153   LDLCALC 61 10/07/2020 0153      Exam:    Vital Signs:  BP 124/84   Pulse 98   Ht '4\' 11"'$  (1.499 m)   Wt 205 lb (93 kg)   SpO2 97%   BMI 41.40 kg/m     Wt Readings from Last 3 Encounters:  11/22/20 205 lb (93 kg)  10/07/20 211  lb 8 oz (95.9 kg)  02/13/19 203 lb 1.6 oz (92.1 kg)     Well nourished, well developed in no acute distress. Alert awake and x3 not in distress.  Diagnosis for this visit:   1. NSTEMI (non-ST elevated myocardial infarction) ([place])   2. PFO (patent foramen ovale)   3. OSA on CPAP   4. Type 2 diabetes mellitus without complication, without long-term current use of insulin ([place])      ASSESSMENT & PLAN:    1.  History of non-STEMI.  Likely cardiac catheterization showing 25% lesion in proximal LAD.  Case risk factors modifications.  She is on antiplatelet therapy which I will continue. 2.  She still gets some abuse of chest pain however rare.  I will try to put on the ranolazine and see how she responds to the therapy. 3.  Diabetes to be followed by antimedicine team.  COVID-19 Education: The signs and symptoms of COVID-19 were discussed with the patient and how to seek care for testing (follow up with PCP or arrange E-visit).  The importance of social distancing was discussed today.  Patient Risk:   After full review of this patients clinical status, I feel that they are at least moderate risk at this time.  Time:   Today, I have spent 20 minutes with the patient with telehealth technology discussing pt health issues.  I spent 15 minutes reviewing her chart before the visit.  Visit was finished at 2:30 PM.    Medication Adjustments/Labs and Tests Ordered: Current medicines are reviewed at length with the patient today.  Concerns regarding medicines are outlined above.  No orders of the defined types were placed in this encounter.  Medication changes: No orders of the defined types were placed in this encounter.  I did review record from the hospital for this visit  Disposition: Follow-up 3 months  Signed, Park Liter, MD, [facility] 11/22/2020 2:27 PM    [place]

## 2020-12-19 ENCOUNTER — Encounter (HOSPITAL_COMMUNITY): Payer: Self-pay | Admitting: Emergency Medicine

## 2020-12-19 ENCOUNTER — Emergency Department (HOSPITAL_COMMUNITY)
Admission: EM | Admit: 2020-12-19 | Discharge: 2020-12-19 | Disposition: A | Discharge status: Home / Self Care | Payer: Medicare Other | Attending: Emergency Medicine | Admitting: Emergency Medicine

## 2020-12-19 ENCOUNTER — Emergency Department (HOSPITAL_COMMUNITY): Payer: Medicare Other

## 2020-12-19 ENCOUNTER — Other Ambulatory Visit: Payer: Self-pay

## 2020-12-19 DIAGNOSIS — R079 Chest pain, unspecified: Secondary | ICD-10-CM

## 2020-12-19 DIAGNOSIS — Z91040 Latex allergy status: Secondary | ICD-10-CM | POA: Insufficient documentation

## 2020-12-19 DIAGNOSIS — Z87891 Personal history of nicotine dependence: Secondary | ICD-10-CM | POA: Insufficient documentation

## 2020-12-19 DIAGNOSIS — Z96653 Presence of artificial knee joint, bilateral: Secondary | ICD-10-CM | POA: Diagnosis not present

## 2020-12-19 DIAGNOSIS — Z794 Long term (current) use of insulin: Secondary | ICD-10-CM | POA: Diagnosis not present

## 2020-12-19 DIAGNOSIS — J45909 Unspecified asthma, uncomplicated: Secondary | ICD-10-CM | POA: Diagnosis not present

## 2020-12-19 DIAGNOSIS — Z7982 Long term (current) use of aspirin: Secondary | ICD-10-CM | POA: Insufficient documentation

## 2020-12-19 DIAGNOSIS — E119 Type 2 diabetes mellitus without complications: Secondary | ICD-10-CM | POA: Diagnosis not present

## 2020-12-19 DIAGNOSIS — R109 Unspecified abdominal pain: Secondary | ICD-10-CM | POA: Insufficient documentation

## 2020-12-19 DIAGNOSIS — R112 Nausea with vomiting, unspecified: Secondary | ICD-10-CM | POA: Insufficient documentation

## 2020-12-19 DIAGNOSIS — R072 Precordial pain: Secondary | ICD-10-CM | POA: Insufficient documentation

## 2020-12-19 DIAGNOSIS — Z79899 Other long term (current) drug therapy: Secondary | ICD-10-CM | POA: Insufficient documentation

## 2020-12-19 DIAGNOSIS — Z20822 Contact with and (suspected) exposure to covid-19: Secondary | ICD-10-CM | POA: Insufficient documentation

## 2020-12-19 DIAGNOSIS — I10 Essential (primary) hypertension: Secondary | ICD-10-CM | POA: Diagnosis not present

## 2020-12-19 LAB — COMPREHENSIVE METABOLIC PANEL
ALT: 17 U/L (ref 0–44)
AST: 18 U/L (ref 15–41)
Albumin: 3.2 g/dL — ABNORMAL LOW (ref 3.5–5.0)
Alkaline Phosphatase: 88 U/L (ref 38–126)
Anion gap: 7 (ref 5–15)
BUN: 12 mg/dL (ref 6–20)
CO2: 28 mmol/L (ref 22–32)
Calcium: 8.6 mg/dL — ABNORMAL LOW (ref 8.9–10.3)
Chloride: 108 mmol/L (ref 98–111)
Creatinine, Ser: 0.85 mg/dL (ref 0.44–1.00)
GFR, Estimated: >60 mL/min (ref >60)
Glucose, Bld: 66 mg/dL — ABNORMAL LOW (ref 70–99)
Potassium: 4.2 mmol/L (ref 3.5–5.1)
Sodium: 143 mmol/L (ref 135–145)
Total Bilirubin: 0.3 mg/dL (ref 0.3–1.2)
Total Protein: 6.3 g/dL — ABNORMAL LOW (ref 6.5–8.1)

## 2020-12-19 LAB — TROPONIN I (HIGH SENSITIVITY)
Troponin I (High Sensitivity): 3 ng/L (ref <18)
Troponin I (High Sensitivity): 4 ng/L (ref <18)

## 2020-12-19 LAB — CBC WITH DIFFERENTIAL/PLATELET
Abs Immature Granulocytes: 0.06 10*3/uL (ref 0.00–0.07)
Basophils Absolute: 0.1 10*3/uL (ref 0.0–0.1)
Basophils Relative: 1 %
Eosinophils Absolute: 0.4 10*3/uL (ref 0.0–0.5)
Eosinophils Relative: 3 %
HCT: 42.3 % (ref 36.0–46.0)
Hemoglobin: 13.6 g/dL (ref 12.0–15.0)
Immature Granulocytes: 1 %
Lymphocytes Relative: 46 %
Lymphs Abs: 4.8 10*3/uL — ABNORMAL HIGH (ref 0.7–4.0)
MCH: 30.7 pg (ref 26.0–34.0)
MCHC: 32.2 g/dL (ref 30.0–36.0)
MCV: 95.5 fL (ref 80.0–100.0)
Monocytes Absolute: 0.5 10*3/uL (ref 0.1–1.0)
Monocytes Relative: 5 %
Neutro Abs: 4.6 10*3/uL (ref 1.7–7.7)
Neutrophils Relative %: 44 %
Platelets: 512 10*3/uL — ABNORMAL HIGH (ref 150–400)
RBC: 4.43 MIL/uL (ref 3.87–5.11)
RDW: 14.6 % (ref 11.5–15.5)
WBC: 10.4 10*3/uL (ref 4.0–10.5)
nRBC: 0 % (ref 0.0–0.2)

## 2020-12-19 LAB — RESP PANEL BY RT-PCR (FLU A&B, COVID) ARPGX2
Influenza A by PCR: NEGATIVE
Influenza B by PCR: NEGATIVE
SARS Coronavirus 2 by RT PCR: NEGATIVE

## 2020-12-19 LAB — LIPASE, BLOOD: Lipase: 34 U/L (ref 11–51)

## 2020-12-19 MED ORDER — IOHEXOL 350 MG/ML SOLN
80.0000 mL | Freq: Once | INTRAVENOUS | Status: AC | PRN
  Administered 2020-12-19: 80 mL via INTRAVENOUS

## 2020-12-19 MED ORDER — MORPHINE SULFATE (PF) 4 MG/ML IV SOLN
4.0000 mg | Freq: Once | INTRAVENOUS | Status: AC
  Administered 2020-12-19: 4 mg via INTRAVENOUS
  Filled 2020-12-19: qty 1

## 2020-12-19 MED ORDER — ONDANSETRON HCL 4 MG/2ML IJ SOLN
4.0000 mg | Freq: Once | INTRAMUSCULAR | Status: AC
  Administered 2020-12-19: 4 mg via INTRAVENOUS
  Filled 2020-12-19: qty 2

## 2020-12-19 NOTE — Discharge Instructions (Signed)
Your work-up here was unremarkable.  You had 2 negative troponins which makes it very unlikely had a heart attack.  Your CT scan of your abdomen pelvis did not show any obvious acute abnormality.  Your blood work did not show any issue with your liver or pancreas.  Please follow-up with your family doctor in the office.  You can continue to try MiraLAX at home.  Take 8 scoops of miralax in 32oz of whatever you would like to drink.(Gatorade comes in this size) You can also use a fleets enema which you can buy over the counter at the pharmacy.  Return for worsening abdominal pain, vomiting or fever.

## 2020-12-19 NOTE — ED Notes (Signed)
Patient verbalizes understanding of discharge instructions. Follow-up care reviewed. Opportunity for questioning and answers were provided. Armband removed by staff, pt discharged from ED ambulatory.  

## 2020-12-19 NOTE — ED Provider Notes (Signed)
[facility] EMERGENCY DEPARTMENT Provider Note   CSN: [id number] Arrival date & time: 12/19/20  1717     History Chief Complaint  Patient presents with   Chest Pain    [name] is a 55 y.o. female.  55 yo  F With a chief complaints of chest pain.  This is left-sided feels like a squeezing.  Hurts on the left lateral aspect of her chest as well as the upper part of her sternum.  Seems to come and go.  Not exertional.  She denies cough congestion or fever denies trauma to the chest.  Denies history of PE or DVT.  Was recently in the hospital with concern for possible MI.  Had a cardiac cath that was without change from prior.  The history is provided by the patient.  Chest Pain Pain location:  L chest and L lateral chest Pain quality: aching   Pain radiates to:  Does not radiate Pain severity:  Moderate Onset quality:  Gradual Duration:  2 days Timing:  Constant Progression:  Worsening Chronicity:  New Relieved by:  Nothing Worsened by:  Nothing Ineffective treatments:  None tried Associated symptoms: abdominal pain, nausea and vomiting   Associated symptoms: no dizziness, no fever, no headache, no palpitations and no shortness of breath       Past Medical History:  Diagnosis Date   Anxiety    Arthritis    osteoarthritis-knees   Asthma    Breast fibroadenoma, left    Depression    Diabetes mellitus without complication (HCC)    Disease of eye characterized by increased eye pressure    no eye drops used-some increased pressure   GERD (gastroesophageal reflux disease)    Headache(784.0)    occ. with allergies/sinus issues   History of colon polyps 04/2016   History of MRSA infection 2007   Hypertension    no problems since weight loss -'09   Multiple food allergies    Latex allergy-respiratory   Obesity    Tendonitis    Vitamin D deficiency    history of    Patient Active Problem List   Diagnosis Date Noted   Obesity 11/21/2020    Vitamin D deficiency 11/04/2020   Tendonitis 11/04/2020   Multiple food allergies 11/04/2020   Disease of eye characterized by increased eye pressure 11/04/2020   Breast fibroadenoma, left 11/04/2020   Arthritis 11/04/2020   NSTEMI (non-ST elevated myocardial infarction) ([place]) 10/06/2020   Restless leg syndrome 05/15/2019   Unstable angina (HCC)    Abnormal stress test    Chest pain 02/09/2019   Hypoglycemia 02/09/2019   Syncope 02/09/2019   PFO (patent foramen ovale) 09/22/2018   OSA on CPAP 04/19/2018   Pain in left knee 12/31/2017   DOE (dyspnea on exertion) 11/22/2017   Genetic testing 07/12/2017   Breast mass, left 06/29/2017   At high risk for breast cancer 06/29/2017   Screening for cervical cancer 12/19/2016   Right upper quadrant abdominal pain 07/15/2016   Type 2 diabetes mellitus without complication, without long-term current use of insulin ([place]) 06/16/2016   History of colon polyps 04/2016   Elevated glucose 03/02/2016   History of vitamin D deficiency 03/02/2016   Family history of malignant neoplasm of breast 01/27/2016   Abnormal mammogram of left breast 01/20/2016   Asthma 12/17/2015   Fibromyalgia 12/17/2015   Seasonal allergic rhinitis due to pollen 12/16/2015   Personal history of tobacco use, presenting hazards to health 06/27/2013   Insomnia  02/06/2013   Reactive depression 02/06/2013   Unspecified constipation 02/06/2013   Anemia, iron deficiency 02/06/2013   Expected blood loss anemia 02/01/2013   Morbid obesity with BMI of 40.0-44.9, adult ([place]) 02/01/2013   S/P right TKA 01/31/2013   S/P left TKA 01/31/2013   Osteoarthritis of knee 12/19/2012   Skin sensation disturbance 12/03/2012   Headache(784.0) 11/28/2012   Stroke-like symptoms 11/26/2012   Toxic effect of venom 12/16/2011   Backache 03/06/2009   MDD (major depressive disorder) 03/05/2009   Hypertension associated with diabetes ([place]) 03/05/2009   FATTY LIVER DISEASE 03/05/2009   GERD  (gastroesophageal reflux disease) 11/29/2008   Anxiety state 10/31/2008   History of MRSA infection 2007    Past Surgical History:  Procedure Laterality Date   ABDOMINAL HYSTERECTOMY     BOWEL RESECTION     CESAREAN SECTION     x4   COLONOSCOPY W/ POLYPECTOMY  04/2016   KNEE SURGERY     Right knee scope 8'12   LEFT HEART CATH AND CORONARY ANGIOGRAPHY N/A 02/13/2019   Procedure: LEFT HEART CATH AND CORONARY ANGIOGRAPHY;  Surgeon: Nelva Bush, MD;  Location: [facility];  Service: Cardiovascular;  Laterality: N/A;   LEFT HEART CATH AND CORONARY ANGIOGRAPHY N/A 10/08/2020   Procedure: LEFT HEART CATH AND CORONARY ANGIOGRAPHY;  Surgeon: Martinique, Peter M, MD;  Location: [facility];  Service: Cardiovascular;  Laterality: N/A;   TOTAL KNEE ARTHROPLASTY Right 01/31/2013   Procedure: RIGHT TOTAL KNEE ARTHROPLASTY;  Surgeon: [name], MD;  Location: [facility];  Service: Orthopedics;  Laterality: Right;   TOTAL KNEE ARTHROPLASTY Left 02/15/2018   Procedure: LEFT TOTAL KNEE ARTHROPLASTY;  Surgeon: Paralee Cancel, MD;  Location: [facility];  Service: Orthopedics;  Laterality: Left;  68     OB History   No obstetric history on file.     Family History  Problem Relation Age of Onset   Lung cancer Mother    Diabetes Father    Colon cancer Neg Hx     Social History   Tobacco Use   Smoking status: Former    Types: Cigarettes    Quit date: 01/25/2005    Years since quitting: 15.9   Smokeless tobacco: Never  Vaping Use   Vaping Use: Never used  Substance Use Topics   Alcohol use: No   Drug use: No    Home Medications Prior to Admission medications   Medication Sig Start Date End Date Taking? Authorizing Provider  amLODipine (NORVASC) 5 MG tablet TAKE 1 TABLET BY MOUTH ONCE DAILY Patient taking differently: Take 5 mg by mouth daily. 11/05/20   Cheryln Manly, NP  aspirin 81 MG chewable tablet Chew 81 mg by mouth daily.    [provider]  atorvastatin  (LIPITOR) 10 MG tablet Take 10 mg by mouth at bedtime.    [provider]  BREO ELLIPTA 200-25 MCG/INH AEPB Inhale 1 puff into the lungs daily. 10/30/20   [provider]  busPIRone (BUSPAR) 15 MG tablet Take 15 mg by mouth 2 (two) times daily.    [provider]  calcium carbonate (OS-CAL - DOSED IN MG OF ELEMENTAL CALCIUM) 1250 (500 Ca) MG tablet Take 1 tablet by mouth daily with breakfast.    [provider]  carvedilol (COREG) 6.25 MG tablet Take 1 tablet (6.25 mg total) by mouth 2 (two) times daily with a meal. 02/14/19   Bonnell Public, MD  cholecalciferol (VITAMIN D3) 25 MCG (1000 UNIT)  tablet Take 1,000 Units by mouth daily.    [provider]  cyclobenzaprine (FLEXERIL) 10 MG tablet Take 10 mg by mouth at bedtime as needed for muscle spasms. 09/28/20   [provider]  desvenlafaxine (PRISTIQ) 100 MG 24 hr tablet Take 100 mg by mouth daily. 10/02/20   [provider]  diazepam (VALIUM) 10 MG tablet Take 10 mg by mouth 4 (four) times daily.    [provider]  Dulaglutide 1.5 MG/0.5ML SOPN Inject 0.5 mLs into the skin every 7 (seven) days. 11/09/20   [provider]  EPINEPHrine 0.3 mg/0.3 mL IJ SOAJ injection Inject 0.3 mg into the muscle as needed for anaphylaxis. 06/21/20   Breck Coons, MD  famotidine (PEPCID) 20 MG tablet Take 1 tablet (20 mg total) by mouth 2 (two) times daily. 12/12/19   Palumbo, April, MD  fluticasone (FLONASE) 50 MCG/ACT nasal spray Place 2 sprays into both nostrils daily as needed for allergies.     [provider]  furosemide (LASIX) 20 MG tablet Take 10 mg by mouth daily. 09/04/20   [provider]  glipiZIDE (GLUCOTROL XL) 10 MG 24 hr tablet Take 10 mg by mouth daily. Patient not taking: Reported on 11/22/2020 10/17/18   [provider]  Insulin Glargine (BASAGLAR KWIKPEN) 100 UNIT/ML Inject 16 Units into the skin daily. 11/07/20   [provider]   montelukast (SINGULAIR) 10 MG tablet Take 10 mg by mouth daily. 10/06/17   [provider]  nitroGLYCERIN (NITROSTAT) 0.3 MG SL tablet Place 0.3 mg under the tongue as needed for chest pain. 11/07/20   [provider]  omeprazole (PRILOSEC) 20 MG capsule Take 20 mg by mouth daily.    [provider]  OXYGEN Inhale 3 L into the lungs continuous.    [provider]  OZEMPIC, 0.25 OR 0.5 MG/DOSE, 2 MG/1.5ML SOPN 1 mg by Subconjunctival route See admin instructions. On Tuesdays Patient not taking: Reported on 11/22/2020 10/31/18   [provider]  polyethylene glycol (MIRALAX / GLYCOLAX) 17 g packet Take 17 g by mouth daily.    [provider]  QUEtiapine (SEROQUEL) 200 MG tablet Take 200 mg by mouth at bedtime. 09/28/20   [provider]  raloxifene (EVISTA) 60 MG tablet Take 60 mg by mouth daily. 02/14/19   [provider]  ranolazine (RANEXA) 500 MG 12 hr tablet Take 1 tablet (500 mg total) by mouth 2 (two) times daily. 11/22/20   Park Liter, MD  rOPINIRole (REQUIP) 2 MG tablet Take 2 mg by mouth at bedtime. 08/20/20   [provider]    Allergies    Banana, Cannabinoids, Dog epithelium, Grass pollen(k-o-r-t-swt vern), Latex, Lisinopril-hydrochlorothiazide, Onion, Penicillins, Tobacco, Tylenol [acetaminophen], American cockroach, Cat hair extract, Murine-derived products, Codeine, Hydrocodone, and Insect wax  Review of Systems   Review of Systems  Constitutional:  Negative for chills and fever.  HENT:  Negative for congestion and rhinorrhea.   Eyes:  Negative for redness and visual disturbance.  Respiratory:  Negative for shortness of breath and wheezing.   Cardiovascular:  Positive for chest pain. Negative for palpitations.  Gastrointestinal:  Positive for abdominal pain, nausea and vomiting.  Genitourinary:  Negative for dysuria and urgency.  Musculoskeletal:  Negative for arthralgias and myalgias.  Skin:   Negative for pallor and wound.  Neurological:  Negative for dizziness and headaches.   Physical Exam Updated Vital Signs BP 126/89   Pulse 86   Temp 98.6 F (37  C) (Oral)   Resp 14   SpO2 98%   Physical Exam Vitals and nursing note reviewed.  Constitutional:      General: She is not in acute distress.    Appearance: She is well-developed. She is not diaphoretic.  HENT:     Head: Normocephalic and atraumatic.  Eyes:     Pupils: Pupils are equal, round, and reactive to light.  Cardiovascular:     Rate and Rhythm: Normal rate and regular rhythm.     Heart sounds: No murmur heard.   No friction rub. No gallop.  Pulmonary:     Effort: Pulmonary effort is normal.     Breath sounds: No wheezing or rales.  Chest:     Chest wall: Tenderness present.     Comments: Palpation of the left anterior chest wall with significant tenderness reproduces her symptoms. Abdominal:     General: There is no distension.     Palpations: Abdomen is soft.     Tenderness: There is abdominal tenderness.     Comments: Significant tenderness diffusely with guarding.  Musculoskeletal:        General: No tenderness.     Cervical back: Normal range of motion and neck supple.  Skin:    General: Skin is warm and dry.  Neurological:     Mental Status: She is alert and oriented to person, place, and time.  Psychiatric:        Behavior: Behavior normal.    ED Results / Procedures / Treatments   Labs (all labs ordered are listed, but only abnormal results are displayed) Labs Reviewed  CBC WITH DIFFERENTIAL/PLATELET - Abnormal; Notable for the following components:      Result Value   Platelets 512 (*)    Lymphs Abs 4.8 (*)    All other components within normal limits  COMPREHENSIVE METABOLIC PANEL - Abnormal; Notable for the following components:   Glucose, Bld 66 (*)    Calcium 8.6 (*)    Total Protein 6.3 (*)    Albumin 3.2 (*)    All other components within normal limits  RESP PANEL BY RT-PCR  (FLU A&B, COVID) ARPGX2  LIPASE, BLOOD  TROPONIN I (HIGH SENSITIVITY)  TROPONIN I (HIGH SENSITIVITY)    EKG EKG Interpretation  Date/Time:  Thursday December 19 2020 17:27:42 EDT Ventricular Rate:  88 PR Interval:  142 QRS Duration: 94 QT Interval:  402 QTC Calculation: 486 R Axis:   -39 Text Interpretation: Normal sinus rhythm Left axis deviation Pulmonary disease pattern Prolonged QT Abnormal ECG No significant change since last tracing Confirmed by Deno Etienne [phone]) on 12/19/2020 8:11:10 PM  Radiology DG Chest 2 View  Result Date: 12/19/2020 CLINICAL DATA:  Chest pain. EXAM: CHEST - 2 VIEW COMPARISON:  10/06/2020 FINDINGS: Lateral view degraded by patient arm position. Midline trachea. Borderline cardiomegaly. No pleural effusion or pneumothorax. No congestive failure. Mild subsegmental atelectasis in both lung bases. IMPRESSION: Borderline cardiomegaly, without acute disease. Electronically Signed   By: Abigail Miyamoto M.D.   On: 12/19/2020 18:32   CT ABDOMEN PELVIS W CONTRAST  Result Date: 12/19/2020 CLINICAL DATA:  Abdominal pain EXAM: CT ABDOMEN AND PELVIS WITH CONTRAST TECHNIQUE: Multidetector CT imaging of the abdomen and pelvis was performed using the standard protocol following bolus administration of intravenous contrast. CONTRAST:  34m OMNIPAQUE IOHEXOL 350 MG/ML SOLN COMPARISON:  01/13/2010 FINDINGS: LOWER CHEST: Normal. HEPATOBILIARY: Hepatic steatosis with focal fatty sparing adjacent to the gallbladder fossa. Normal gallbladder PANCREAS: Normal pancreas. No ductal dilatation  or peripancreatic fluid collection. SPLEEN: Normal. ADRENALS/URINARY TRACT: The adrenal glands are normal. No hydronephrosis, nephroureterolithiasis or solid renal mass. The urinary bladder is normal for degree of distention STOMACH/BOWEL: There is no hiatal hernia. Normal duodenal course and caliber. No small bowel dilatation or inflammation. No focal colonic abnormality. Normal appendix.  VASCULAR/LYMPHATIC: There is calcific atherosclerosis of the abdominal aorta. No lymphadenopathy. REPRODUCTIVE: Status post hysterectomy. No adnexal mass. MUSCULOSKELETAL. No bony spinal canal stenosis or focal osseous abnormality. OTHER: None. IMPRESSION: 1. No acute abnormality of the abdomen or pelvis. 2. Hepatic steatosis. Aortic atherosclerosis (ICD10-I70.0). Electronically Signed   By: Ulyses Jarred M.D.   On: 12/19/2020 22:41    Procedures Procedures   Medications Ordered in ED Medications  morphine 4 MG/ML injection 4 mg (4 mg Intravenous Given 12/19/20 2217)  ondansetron (ZOFRAN) injection 4 mg (4 mg Intravenous Given 12/19/20 2217)  iohexol (OMNIPAQUE) 350 MG/ML injection 80 mL (80 mLs Intravenous Contrast Given 12/19/20 2234)    ED Course  I have reviewed the triage vital signs and the nursing notes.  Pertinent labs & imaging results that were available during my care of the patient were reviewed by me and considered in my medical decision making (see chart for details).    MDM Rules/Calculators/A&P                           55 yo F with a chief complaints of left-sided chest pain.  This been going on since early this morning.  Nothing seems to make it better or worse.  Atypical in nature and reproduced on exam.  Had a recent cardiac catheterization that was fairly clean.  Initial troponin is negative will repeat.  Patient with significant abdominal tenderness on exam.  On review of systems the patient does endorse about a week of diffuse severe abdominal discomfort.  Thought to be constipation and has tried MiraLAX without improvement.  Blood work without significant finding.  With such severe tenderness on exam will obtain a CT scan.  CT negative.  Delta trop negative.    11:38 PM:  I have discussed the diagnosis/risks/treatment options with the patient and believe the pt to be eligible for discharge home to follow-up with PCP. We also discussed returning to the ED immediately  if new or worsening sx occur. We discussed the sx which are most concerning (e.g., sudden worsening pain, fever, inability to tolerate by mouth) that necessitate immediate return. Medications administered to the patient during their visit and any new prescriptions provided to the patient are listed below.  Medications given during this visit Medications  morphine 4 MG/ML injection 4 mg (4 mg Intravenous Given 12/19/20 2217)  ondansetron (ZOFRAN) injection 4 mg (4 mg Intravenous Given 12/19/20 2217)  iohexol (OMNIPAQUE) 350 MG/ML injection 80 mL (80 mLs Intravenous Contrast Given 12/19/20 2234)     The patient appears reasonably screen and/or stabilized for discharge and I doubt any other medical condition or other [facility] requiring further screening, evaluation, or treatment in the ED at this time prior to discharge.   Final Clinical Impression(s) / ED Diagnoses Final diagnoses:  Nonspecific chest pain    Rx / DC Orders ED Discharge Orders     None        Deno Etienne, DO 12/19/20 2338

## 2020-12-19 NOTE — ED Provider Notes (Signed)
Emergency Medicine Provider Triage Evaluation Note  [name] , a 55 y.o. female  was evaluated in triage.  Pt complains of chest pain that started a few days ago, also had nv today.  Review of Systems  Positive: Chest pain, nv, diaphoresis Negative: Abd pain  Physical Exam  There were no vitals taken for this visit. Gen:   Awake, no distress   Resp:  Normal effort  MSK:   Moves extremities without difficulty  Other:  Ttp to the anterior and left lateral chest wall, cough present  Medical Decision Making  Medically screening exam initiated at 5:20 PM.  Appropriate orders placed.  [name] was informed that the remainder of the evaluation will be completed by another provider, this initial triage assessment does not replace that evaluation, and the importance of remaining in the ED until their evaluation is complete.     Rodney Booze, PA-C 12/19/20 1724    Daleen Bo, MD 12/21/20 [phone]

## 2020-12-19 NOTE — ED Triage Notes (Signed)
Pt here via  [facility] from home for cp that started yesterday. Pt has had 1 episode of emesis last night and 2 today. Hx NSTEMI in July. EMS gave 1 SL nitro, brought pain from a 10 to an 8, '324mg'$  ASA, and '4mg'$  zofran, 131m NS. Aox4, 20g LAC

## 2020-12-19 NOTE — ED Notes (Signed)
Pt transported to CT ?

## 2021-02-25 ENCOUNTER — Other Ambulatory Visit: Payer: Self-pay

## 2021-02-25 ENCOUNTER — Encounter: Payer: Self-pay | Admitting: Cardiology

## 2021-02-25 ENCOUNTER — Ambulatory Visit (INDEPENDENT_AMBULATORY_CARE_PROVIDER_SITE_OTHER): Payer: Medicare Other | Admitting: Cardiology

## 2021-02-25 VITALS — BP 138/80 | HR 94 | Ht 59.5 in | Wt 212.6 lb

## 2021-02-25 DIAGNOSIS — I152 Hypertension secondary to endocrine disorders: Secondary | ICD-10-CM

## 2021-02-25 DIAGNOSIS — R0609 Other forms of dyspnea: Secondary | ICD-10-CM

## 2021-02-25 DIAGNOSIS — I2510 Atherosclerotic heart disease of native coronary artery without angina pectoris: Secondary | ICD-10-CM | POA: Insufficient documentation

## 2021-02-25 DIAGNOSIS — R109 Unspecified abdominal pain: Secondary | ICD-10-CM

## 2021-02-25 DIAGNOSIS — Z6841 Body Mass Index (BMI) 40.0 and over, adult: Secondary | ICD-10-CM

## 2021-02-25 DIAGNOSIS — I25118 Atherosclerotic heart disease of native coronary artery with other forms of angina pectoris: Secondary | ICD-10-CM

## 2021-02-25 DIAGNOSIS — E1159 Type 2 diabetes mellitus with other circulatory complications: Secondary | ICD-10-CM | POA: Diagnosis not present

## 2021-02-25 MED ORDER — RANOLAZINE ER 1000 MG PO TB12: 1000.0000 mg | ORAL_TABLET | Freq: Two times a day (BID) | ORAL | 1 refills | Status: DC

## 2021-02-25 MED ORDER — AMLODIPINE BESYLATE 10 MG PO TABS: 10.0000 mg | ORAL_TABLET | Freq: Every day | ORAL | 1 refills | Status: DC

## 2021-02-25 NOTE — Patient Instructions (Signed)
Medication Instructions:  Your physician has recommended you make the following change in your medication:   INCREASE: Ranolazine 1000 mg twice daily   INCREASE: Amlodipine 10 mg daily  *If you need a refill on your cardiac medications before your next appointment, please call your pharmacy*   Lab Work: None If you have labs (blood work) drawn today and your tests are completely normal, you will receive your results only by: [place] (if you have MyChart) OR A paper copy in the mail If you have any lab test that is abnormal or we need to change your treatment, we will call you to review the results.   Testing/Procedures: Gallbladder ultrasound    Follow-Up: At [facility], you and your health needs are our priority.  As part of our continuing mission to provide you with exceptional heart care, we have created designated Provider Care Teams.  These Care Teams include your primary Cardiologist (physician) and Advanced Practice Providers (APPs -  Physician Assistants and Nurse Practitioners) who all work together to provide you with the care you need, when you need it.  We recommend signing up for the patient portal called "MyChart".  Sign up information is provided on this After Visit Summary.  MyChart is used to connect with patients for Virtual Visits (Telemedicine).  Patients are able to view lab/test results, encounter notes, upcoming appointments, etc.  Non-urgent messages can be sent to your provider as well.   To learn more about what you can do with MyChart, go to [email or web address].    Your next appointment:   6 week(s)  The format for your next appointment:   In Person  Provider:   Jenne Campus, MD    Other Instructions

## 2021-02-25 NOTE — Addendum Note (Signed)
Addended by: Senaida Ores on: 02/25/2021 03:20 PM   Modules accepted: Orders

## 2021-02-25 NOTE — Progress Notes (Signed)
Cardiology Office Note:    Date:  02/25/2021   ID:  [name], DOB Dec 30, 1965, MRN [id number]  PCP:  Bernerd Limbo, MD  Cardiologist:  Jenne Campus, MD    Referring MD: Bernerd Limbo, MD   No chief complaint on file. I was in the hospital in September for chest pain  History of Present Illness:    [name] is a 55 y.o. female   past medical history significant for essential hypertension, diabetes, obesity.  Recently she ended up going to the hospital because of chest pain.  She ruled in for myocardial infarction with minimal elevation of troponin I.  Cardiac catheterization has been performed which find only 25% lesion in proximal LAD.  Conclusion was that she did have vasospasm may be small branch that got occluded.  She was put on appropriate medications discharged home. Overall she is now doing well.  She is being in the emergency room in September because of chest pain biochemical markers were abnormal she was discharged home, then she had another visit in the emergency room because of pain she actually left before being seen.  She complained of having tightness burning-like sensation in the chest in the upper abdomen.  That sensation happens when she sits when she lay down when she eats when she walks nitroglycerin helped the pain but gave her headache.  Interesting on my physical exam she got tenderness to the right upper quadrant as well as in the epigastrium there is no rebound tenderness.   Past Medical History:  Diagnosis Date   Abnormal mammogram of left breast 01/20/2016   Abnormal stress test    Anemia, iron deficiency 02/06/2013   Anxiety    Anxiety state 10/31/2008   Arthritis    osteoarthritis-knees   Asthma    Breast fibroadenoma, left    Breast mass, left 06/29/2017   Chest pain 02/09/2019   Depression    Diabetes mellitus without complication ([place])    Disease of eye characterized by increased eye pressure    no eye drops used-some increased pressure   DOE  (dyspnea on exertion) 11/22/2017   Elevated glucose 03/02/2016   Fibromyalgia 12/17/2015   Genetic testing 07/12/2017   Formatting of this note might be different from the original. Negative Breast Cancer STAT panel (9 genes) through [facility]:  Formatting of this note might be different from the original.   GERD (gastroesophageal reflux disease)    Headache(784.0)    occ. with allergies/sinus issues   History of colon polyps 04/2016   History of MRSA infection 2007   History of vitamin D deficiency 03/02/2016   Hypertension    no problems since weight loss -'09   Insomnia 02/06/2013   Multiple food allergies    Latex allergy-respiratory   Obesity    Pain in left knee 12/31/2017   Personal history of tobacco use, presenting hazards to health 06/27/2013   Restless leg syndrome 05/15/2019   Right upper quadrant abdominal pain 07/15/2016   S/P left TKA 01/31/2013   Screening for cervical cancer 12/19/2016   Formatting of this note might be different from the original. Status post hysterectomy   Skin sensation disturbance 12/03/2012   Formatting of this note might be different from the original. STORY: She presented to the emergency room with left arm discomfort paresthesia hypertension and a history of some weakness of her face.  CT scan of her head was negative.  Labs were normal. Formatting of this note might be  different from the original. Overview:  Overview:  STORY: She presented to the emergency room with left arm discom   Syncope 02/09/2019   Tendonitis    Toxic effect of venom 12/16/2011   Formatting of this note might be different from the original. STORY: Dr Joesph July: always carry epi pen.   Vitamin D deficiency    history of    Past Surgical History:  Procedure Laterality Date   ABDOMINAL HYSTERECTOMY     BOWEL RESECTION     CESAREAN SECTION     x4   COLONOSCOPY W/ POLYPECTOMY  04/2016   KNEE SURGERY     Right knee scope 8'12   LEFT HEART  CATH AND CORONARY ANGIOGRAPHY N/A 02/13/2019   Procedure: LEFT HEART CATH AND CORONARY ANGIOGRAPHY;  Surgeon: Nelva Bush, MD;  Location: [facility];  Service: Cardiovascular;  Laterality: N/A;   LEFT HEART CATH AND CORONARY ANGIOGRAPHY N/A 10/08/2020   Procedure: LEFT HEART CATH AND CORONARY ANGIOGRAPHY;  Surgeon: Martinique, Peter M, MD;  Location: [facility];  Service: Cardiovascular;  Laterality: N/A;   TOTAL KNEE ARTHROPLASTY Right 01/31/2013   Procedure: RIGHT TOTAL KNEE ARTHROPLASTY;  Surgeon: [name], MD;  Location: [facility];  Service: Orthopedics;  Laterality: Right;   TOTAL KNEE ARTHROPLASTY Left 02/15/2018   Procedure: LEFT TOTAL KNEE ARTHROPLASTY;  Surgeon: Paralee Cancel, MD;  Location: [facility];  Service: Orthopedics;  Laterality: Left;  70    Current Medications: No outpatient medications have been marked as taking for the 02/25/21 encounter (Appointment) with Park Liter, MD.     Allergies:   Banana, Cannabinoids, Dog epithelium, Grass pollen(k-o-r-t-swt vern), Latex, Lisinopril-hydrochlorothiazide, Onion, Penicillins, Tobacco, Tylenol [acetaminophen], American cockroach, Cat hair extract, Murine-derived products, Codeine, Hydrocodone, and Insect wax   Social History   Socioeconomic History   Marital status: Married    Spouse name: antonio   Number of children: 6   Years of education: college   Highest education level: Not on file  Occupational History   Occupation: N/A  Tobacco Use   Smoking status: Former    Types: Cigarettes    Quit date: 01/25/2005    Years since quitting: 16.0   Smokeless tobacco: Never  Vaping Use   Vaping Use: Never used  Substance and Sexual Activity   Alcohol use: No   Drug use: No   Sexual activity: Yes    Partners: Male  Other Topics Concern   Not on file  Social History Narrative   Not on file   Social Determinants of Health   Financial Resource Strain: Not on file  Food Insecurity: Not on file   Transportation Needs: Not on file  Physical Activity: Not on file  Stress: Not on file  Social Connections: Not on file     Family History: The patient's family history includes Diabetes in her father; Lung cancer in her mother. There is no history of Colon cancer. ROS:   Please see the history of present illness.    All 14 point review of systems negative except as described per history of present illness  EKGs/Labs/Other Studies Reviewed:      Recent Labs: 10/06/2020: B Natriuretic Peptide 6.2 12/19/2020: ALT 17; BUN 12; Creatinine, Ser 0.85; Hemoglobin 13.6; Platelets 512; Potassium 4.2; Sodium 143  Recent Lipid Panel    Component Value Date/Time   CHOL 117 10/07/2020 0153   TRIG 69 10/07/2020 0153   HDL 42 10/07/2020 0153   CHOLHDL 2.8 10/07/2020 0153  VLDL 14 10/07/2020 0153   LDLCALC 61 10/07/2020 0153    Physical Exam:    VS:  There were no vitals taken for this visit.    Wt Readings from Last 3 Encounters:  11/22/20 205 lb (93 kg)  10/07/20 211 lb 8 oz (95.9 kg)  02/13/19 203 lb 1.6 oz (92.1 kg)     GEN:  Well nourished, well developed in no acute distress HEENT: Normal NECK: No JVD; No carotid bruits LYMPHATICS: No lymphadenopathy CARDIAC: RRR, no murmurs, no rubs, no gallops RESPIRATORY:  Clear to auscultation without rales, wheezing or rhonchi  ABDOMEN: Soft, non-tender, non-distended MUSCULOSKELETAL:  No edema; No deformity  SKIN: Warm and dry LOWER EXTREMITIES: no swelling NEUROLOGIC:  Alert and oriented x 3 PSYCHIATRIC:  Normal affect   ASSESSMENT:    1. Hypertension associated with diabetes ([place])   2. Coronary artery disease of native artery of native heart with stable angina pectoris ([place])   3. DOE (dyspnea on exertion)   4. Morbid obesity with BMI of 40.0-44.9, adult (HCC)    PLAN:    In order of problems listed above:  Coronary artery disease she did have a cardiac catheterization which only 20% proximal LAD.  That does not explain her  symptomatology.  She is being treated with antianginal medications with suspicion for either small vessel disease or spasm however since she still gets a lot of pain.  Biochemical markers has been normal.  I will increase dose of ranolazine as well as amlodipine.  However I am worried about noncardiac etiology of her symptoms.  She will be referral to GI specialist, she will also have a gallbladder ultrasound performed.  I told her if the pain gets worse she needs to go to the emergency room. Essential hypertension, blood pressure well controlled continue present management. Dyslipidemia we did review her K PN which show HDL of 42 with LDL 61 it is a good cholesterol profile we will continue present management.   Medication Adjustments/Labs and Tests Ordered: Current medicines are reviewed at length with the patient today.  Concerns regarding medicines are outlined above.  No orders of the defined types were placed in this encounter.  Medication changes: No orders of the defined types were placed in this encounter.   Signed, Park Liter, MD, [facility] 02/25/2021 2:00 PM    [place]

## 2021-02-25 NOTE — Addendum Note (Signed)
Addended by: Senaida Ores on: 02/25/2021 02:56 PM   Modules accepted: Orders

## 2021-03-08 ENCOUNTER — Ambulatory Visit (HOSPITAL_BASED_OUTPATIENT_CLINIC_OR_DEPARTMENT_OTHER): Admission: RE | Admit: 2021-03-08 | Payer: Medicare Other | Source: Ambulatory Visit

## 2021-03-14 ENCOUNTER — Emergency Department (HOSPITAL_COMMUNITY)
Admission: EM | Admit: 2021-03-14 | Discharge: 2021-03-15 | Disposition: A | Discharge status: Home / Self Care | Payer: Medicare Other | Attending: Emergency Medicine | Admitting: Emergency Medicine

## 2021-03-14 ENCOUNTER — Emergency Department (HOSPITAL_COMMUNITY): Payer: Medicare Other

## 2021-03-14 DIAGNOSIS — J45909 Unspecified asthma, uncomplicated: Secondary | ICD-10-CM | POA: Insufficient documentation

## 2021-03-14 DIAGNOSIS — Z91040 Latex allergy status: Secondary | ICD-10-CM | POA: Diagnosis not present

## 2021-03-14 DIAGNOSIS — R0789 Other chest pain: Secondary | ICD-10-CM | POA: Diagnosis not present

## 2021-03-14 DIAGNOSIS — Z96653 Presence of artificial knee joint, bilateral: Secondary | ICD-10-CM | POA: Diagnosis not present

## 2021-03-14 DIAGNOSIS — E119 Type 2 diabetes mellitus without complications: Secondary | ICD-10-CM | POA: Diagnosis not present

## 2021-03-14 DIAGNOSIS — Z7982 Long term (current) use of aspirin: Secondary | ICD-10-CM | POA: Diagnosis not present

## 2021-03-14 DIAGNOSIS — I10 Essential (primary) hypertension: Secondary | ICD-10-CM | POA: Insufficient documentation

## 2021-03-14 DIAGNOSIS — R202 Paresthesia of skin: Secondary | ICD-10-CM | POA: Insufficient documentation

## 2021-03-14 DIAGNOSIS — N9489 Other specified conditions associated with female genital organs and menstrual cycle: Secondary | ICD-10-CM | POA: Diagnosis not present

## 2021-03-14 DIAGNOSIS — R531 Weakness: Secondary | ICD-10-CM | POA: Diagnosis not present

## 2021-03-14 DIAGNOSIS — Z87891 Personal history of nicotine dependence: Secondary | ICD-10-CM | POA: Insufficient documentation

## 2021-03-14 DIAGNOSIS — R079 Chest pain, unspecified: Secondary | ICD-10-CM | POA: Diagnosis present

## 2021-03-14 DIAGNOSIS — Z853 Personal history of malignant neoplasm of breast: Secondary | ICD-10-CM | POA: Insufficient documentation

## 2021-03-14 DIAGNOSIS — Z794 Long term (current) use of insulin: Secondary | ICD-10-CM | POA: Diagnosis not present

## 2021-03-14 DIAGNOSIS — I2510 Atherosclerotic heart disease of native coronary artery without angina pectoris: Secondary | ICD-10-CM | POA: Insufficient documentation

## 2021-03-14 DIAGNOSIS — Z20822 Contact with and (suspected) exposure to covid-19: Secondary | ICD-10-CM | POA: Insufficient documentation

## 2021-03-14 DIAGNOSIS — Z79899 Other long term (current) drug therapy: Secondary | ICD-10-CM | POA: Diagnosis not present

## 2021-03-14 LAB — I-STAT CHEM 8, ED
BUN: 10 mg/dL (ref 6–20)
Calcium, Ion: 1.07 mmol/L — ABNORMAL LOW (ref 1.15–1.40)
Chloride: 108 mmol/L (ref 98–111)
Creatinine, Ser: 0.7 mg/dL (ref 0.44–1.00)
Glucose, Bld: 84 mg/dL (ref 70–99)
HCT: 42 % (ref 36.0–46.0)
Hemoglobin: 14.3 g/dL (ref 12.0–15.0)
Potassium: 3.8 mmol/L (ref 3.5–5.1)
Sodium: 141 mmol/L (ref 135–145)
TCO2: 24 mmol/L (ref 22–32)

## 2021-03-14 LAB — DIFFERENTIAL
Abs Immature Granulocytes: 0.03 10*3/uL (ref 0.00–0.07)
Basophils Absolute: 0 10*3/uL (ref 0.0–0.1)
Basophils Relative: 1 %
Eosinophils Absolute: 0.2 10*3/uL (ref 0.0–0.5)
Eosinophils Relative: 2 %
Immature Granulocytes: 0 %
Lymphocytes Relative: 40 %
Lymphs Abs: 3.2 10*3/uL (ref 0.7–4.0)
Monocytes Absolute: 0.4 10*3/uL (ref 0.1–1.0)
Monocytes Relative: 4 %
Neutro Abs: 4.2 10*3/uL (ref 1.7–7.7)
Neutrophils Relative %: 53 %

## 2021-03-14 LAB — COMPREHENSIVE METABOLIC PANEL
ALT: 16 U/L (ref 0–44)
AST: 22 U/L (ref 15–41)
Albumin: 3.4 g/dL — ABNORMAL LOW (ref 3.5–5.0)
Alkaline Phosphatase: 83 U/L (ref 38–126)
Anion gap: 10 (ref 5–15)
BUN: 10 mg/dL (ref 6–20)
CO2: 22 mmol/L (ref 22–32)
Calcium: 8.6 mg/dL — ABNORMAL LOW (ref 8.9–10.3)
Chloride: 107 mmol/L (ref 98–111)
Creatinine, Ser: 0.77 mg/dL (ref 0.44–1.00)
GFR, Estimated: >60 mL/min (ref >60)
Glucose, Bld: 86 mg/dL (ref 70–99)
Potassium: 4.4 mmol/L (ref 3.5–5.1)
Sodium: 139 mmol/L (ref 135–145)
Total Bilirubin: 0.6 mg/dL (ref 0.3–1.2)
Total Protein: 6.3 g/dL — ABNORMAL LOW (ref 6.5–8.1)

## 2021-03-14 LAB — I-STAT BETA HCG BLOOD, ED (MC, WL, AP ONLY): I-stat hCG, quantitative: <5 m[IU]/mL (ref <5)

## 2021-03-14 LAB — RESP PANEL BY RT-PCR (FLU A&B, COVID) ARPGX2
Influenza A by PCR: NEGATIVE
Influenza B by PCR: NEGATIVE
SARS Coronavirus 2 by RT PCR: NEGATIVE

## 2021-03-14 LAB — CBG MONITORING, ED: Glucose-Capillary: 98 mg/dL (ref 70–99)

## 2021-03-14 LAB — CBC
HCT: 41.2 % (ref 36.0–46.0)
Hemoglobin: 13.1 g/dL (ref 12.0–15.0)
MCH: 30.3 pg (ref 26.0–34.0)
MCHC: 31.8 g/dL (ref 30.0–36.0)
MCV: 95.2 fL (ref 80.0–100.0)
Platelets: 459 10*3/uL — ABNORMAL HIGH (ref 150–400)
RBC: 4.33 MIL/uL (ref 3.87–5.11)
RDW: 14.7 % (ref 11.5–15.5)
WBC: 8.1 10*3/uL (ref 4.0–10.5)
nRBC: 0 % (ref 0.0–0.2)

## 2021-03-14 LAB — TROPONIN I (HIGH SENSITIVITY): Troponin I (High Sensitivity): 5 ng/L (ref <18)

## 2021-03-14 LAB — ETHANOL: Alcohol, Ethyl (B): <10 mg/dL (ref <10)

## 2021-03-14 LAB — PROTIME-INR
INR: 1 (ref 0.8–1.2)
Prothrombin Time: 12.6 seconds (ref 11.4–15.2)

## 2021-03-14 LAB — APTT: aPTT: 32 seconds (ref 24–36)

## 2021-03-14 MED ORDER — IOHEXOL 350 MG/ML SOLN
50.0000 mL | Freq: Once | INTRAVENOUS | Status: AC | PRN
  Administered 2021-03-14: 50 mL via INTRAVENOUS

## 2021-03-14 MED ORDER — METOCLOPRAMIDE HCL 5 MG/ML IJ SOLN
5.0000 mg | Freq: Once | INTRAMUSCULAR | Status: AC
  Administered 2021-03-14: 5 mg via INTRAVENOUS
  Filled 2021-03-14: qty 2

## 2021-03-14 NOTE — ED Notes (Signed)
The pt reported that her husband  told her that she had slurred speech sometime todat  her speech is clear  at present

## 2021-03-14 NOTE — ED Notes (Addendum)
The pt returned from mri  c/o a headache and chest pain still.  10/10

## 2021-03-14 NOTE — ED Notes (Signed)
The pt passed her swallow screen

## 2021-03-14 NOTE — ED Triage Notes (Signed)
Pt here for cp and L side weakness, pt LKN 2230 last night, woke up this morning w/ symptoms and slurred speech (resolved now). Pt has weakenss to L leg and arm, visual deficit on L side.

## 2021-03-14 NOTE — Code Documentation (Signed)
Stroke Response Nurse Documentation Code Documentation  [name] is a 55 y.o. female arriving to [facility] ED via [facility] on 03/14/2021 with past medical hx of DM, NSTEMI, depression OSA. On aspirin 81 mg daily. Code stroke was activated by EMS, ED.  Patient from home where she was LKW at 2230 last night and now complaining of Left side weakness, decreased sensory .  Patient noted her right side weakness when she woke up this morning about 6:30am.  She states she also had some chest pain and has taken SL NTG.  She currently has a mild HA  Stroke team at the bedside on patient arrival. Labs drawn and patient cleared for CT by [place] PA. Patient to CT with team. NIHSS 4, see documentation for details and code stroke times. Patient with left hemianopia, left leg weakness, and left decreased sensation on exam. The following imaging was completed:  CT, CTA head and neck. Patient is not a candidate for IV Thrombolytic due to being outside the window. Patient is not a candidate for IR due to no LVO on CTA.   Care/Plan: neuro checks and VS q 2 hrs.   Bedside handoff with ED RN Gerald Stabs.    Raliegh Ip  Stroke Response RN

## 2021-03-14 NOTE — Consult Note (Signed)
Neurology Consultation Reason for Consult: Left-sided weakness Referring Physician: Roxine Caddy  CC: Left-sided weakness and numbness  History is obtained from: Patient  HPI: [name] is a 55 y.o. female with a history of diabetes, hypertension, migraine who presents with left-sided weakness that started on awakening this morning.  She also complains of chest pain and has taken a nitroglycerin today.  She states that she has a headache which is bifrontal in location and has been present since even before her nitroglycerin.  She does have a history of migraines, but has never had visual or paresthesia symptoms associated with them.  Today, she notices spots in her vision that are "swirling" as well as left-sided paresthesia which she describes as tingling.  It has been present to some degree all day, and she has noticed some left-sided weakness as well.   LKW: 12/8 prior to bed tpa given?: no, outside of window   ROS: A 14 point ROS was performed and is negative except as noted in the HPI.   Past Medical History:  Diagnosis Date   Abnormal mammogram of left breast 01/20/2016   Abnormal stress test    Anemia, iron deficiency 02/06/2013   Anxiety    Anxiety state 10/31/2008   Arthritis    osteoarthritis-knees   Asthma    Breast fibroadenoma, left    Breast mass, left 06/29/2017   Chest pain 02/09/2019   Depression    Diabetes mellitus without complication ([place])    Disease of eye characterized by increased eye pressure    no eye drops used-some increased pressure   DOE (dyspnea on exertion) 11/22/2017   Elevated glucose 03/02/2016   Fibromyalgia 12/17/2015   Genetic testing 07/12/2017   Formatting of this note might be different from the original. Negative Breast Cancer STAT panel (9 genes) through [facility]:  Formatting of this note might be different from the original.   GERD (gastroesophageal reflux disease)    Headache(784.0)    occ. with allergies/sinus  issues   History of colon polyps 04/2016   History of MRSA infection 2007   History of vitamin D deficiency 03/02/2016   Hypertension    no problems since weight loss -'09   Insomnia 02/06/2013   Multiple food allergies    Latex allergy-respiratory   Obesity    Pain in left knee 12/31/2017   Personal history of tobacco use, presenting hazards to health 06/27/2013   Restless leg syndrome 05/15/2019   Right upper quadrant abdominal pain 07/15/2016   S/P left TKA 01/31/2013   Screening for cervical cancer 12/19/2016   Formatting of this note might be different from the original. Status post hysterectomy   Skin sensation disturbance 12/03/2012   Formatting of this note might be different from the original. STORY: She presented to the emergency room with left arm discomfort paresthesia hypertension and a history of some weakness of her face.  CT scan of her head was negative.  Labs were normal. Formatting of this note might be different from the original. Overview:  Overview:  STORY: She presented to the emergency room with left arm discom   Syncope 02/09/2019   Tendonitis    Toxic effect of venom 12/16/2011   Formatting of this note might be different from the original. STORY: Dr Joesph July: always carry epi pen.   Vitamin D deficiency    history of     Family History  Problem Relation Age of Onset   Lung cancer Mother  Diabetes Father    Colon cancer Neg Hx      Social History:  reports that she quit smoking about 16 years ago. She has never used smokeless tobacco. She reports that she does not drink alcohol and does not use drugs.   Exam: Current vital signs: BP (!) 135/92 (BP Location: Right Arm)   Pulse 89   Temp 99 F (37.2 C)   Resp 20   SpO2 94%  Vital signs in last 24 hours: Temp:  [99 F (37.2 C)] 99 F (37.2 C) (12/09 1712) Pulse Rate:  [89] 89 (12/09 1712) Resp:  [20] 20 (12/09 1712) BP: (135)/(92) 135/92 (12/09 1712) SpO2:  [94 %] 94 % (12/09  1712)   Physical Exam  Constitutional: Appears well-developed and well-nourished.  Psych: Affect appropriate to situation Eyes: No scleral injection HENT: No OP obstruction MSK: no joint deformities.  Cardiovascular: Normal rate and regular rhythm.  Respiratory: Effort normal, non-labored breathing GI: Soft.  No distension. There is no tenderness.  Skin: WDI  Neuro: Mental Status: Patient is awake, alert, oriented to person, place, month, year, and situation. Patient is able to give a clear and coherent history. No signs of aphasia or neglect Cranial Nerves: II: She has an incomplete left hemianopia. Pupils are equal, round, and reactive to light.   III,IV, VI: EOMI without ptosis or diploplia.  V: Facial sensation is symmetric to temperature VII: Facial movement is inconsistent, but no clear asymmetry when testing in multiple ways VIII: hearing is intact to voice X: Uvula elevates symmetrically XI: Shoulder shrug is symmetric. XII: tongue is midline without atrophy or fasciculations.  Motor: She has an inconsistent strength exam, but when repeatedly prompted is able to hold them without drift Sensory: Sensation is diminished on the left side markedly, no extinction where she can feel Cerebellar: She is unable to adequately perform finger-nose-finger on the left, though she does not have a clear dysmetric tremor  I have reviewed labs in epic and the results pertinent to this consultation are: Creatinine 0.7  I have reviewed the images obtained:CT/CTA - negaitve.   Impression: 55 year old female with a history of migraines who presents with left-sided visual symptoms and paresthesia which I feel are most consistent with complicated migraine.  She has an inconsistent exam, but I do think that a small thalamic infarct could give some the symptoms that she is describing and therefore I do think an MRI would be prudent.  Recommendations: 1) MRI brain 2) if negative, would treat  as complicated migraine.   Roland Rack, MD [facility] [phone]  If 7pm- 7am, please page neurology on call as listed in [place].

## 2021-03-14 NOTE — ED Provider Notes (Signed)
[facility] EMERGENCY DEPARTMENT Provider Note   CSN: [id number] Arrival date & time: 03/14/21  1711  An emergency department physician performed an initial assessment on this suspected stroke patient at 19.  History Chief Complaint  Patient presents with   Code Stroke    [name] is a 55 y.o. female who presents emergency department chief complaint of chest pain and weakness.  Patient states that she was last known normal at 10:30 PM last night when she went to sleep.  When she woke this morning she noticed numbness and tingling on the left side of her body and weakness in her left arm and leg.  She also had chest pain.  She asked her husband to give her nitroglycerin and fell asleep due to a headache that she got after taking the medication.  She said when she woke back around 2:30 PM she had weakness in her left arm and left leg as well as weakness on the left side of her face, slurred speech and visual changes on the left.  She states that she waited to see if it would go away but her husband noticed that she was speaking abnormally and called 911.  The patient has a history of migraine headaches but does not have any abnormal or complicated symptoms with them.  She states that she thinks her headache is from the nitroglycerin.  She has never had abnormal symptoms like this before.  She states that her chest pain is left-sided.  It does not radiate, nothing makes it worse or better.  It did improve with the nitroglycerin earlier but is currently still there and moderate.  She denies shortness of breath, diaphoresis nausea or vomiting.  She denies a history of CAD or MI however she has a history of previous NSTEMI according to her EMR and mild CAD.  HPI     Past Medical History:  Diagnosis Date   Abnormal mammogram of left breast 01/20/2016   Abnormal stress test    Anemia, iron deficiency 02/06/2013   Anxiety    Anxiety state 10/31/2008   Arthritis     osteoarthritis-knees   Asthma    Breast fibroadenoma, left    Breast mass, left 06/29/2017   Chest pain 02/09/2019   Depression    Diabetes mellitus without complication ([place])    Disease of eye characterized by increased eye pressure    no eye drops used-some increased pressure   DOE (dyspnea on exertion) 11/22/2017   Elevated glucose 03/02/2016   Fibromyalgia 12/17/2015   Genetic testing 07/12/2017   Formatting of this note might be different from the original. Negative Breast Cancer STAT panel (9 genes) through [facility]:  Formatting of this note might be different from the original.   GERD (gastroesophageal reflux disease)    Headache(784.0)    occ. with allergies/sinus issues   History of colon polyps 04/2016   History of MRSA infection 2007   History of vitamin D deficiency 03/02/2016   Hypertension    no problems since weight loss -'09   Insomnia 02/06/2013   Multiple food allergies    Latex allergy-respiratory   Obesity    Pain in left knee 12/31/2017   Personal history of tobacco use, presenting hazards to health 06/27/2013   Restless leg syndrome 05/15/2019   Right upper quadrant abdominal pain 07/15/2016   S/P left TKA 01/31/2013   Screening for cervical cancer 12/19/2016   Formatting of this note might be different  from the original. Status post hysterectomy   Skin sensation disturbance 12/03/2012   Formatting of this note might be different from the original. STORY: She presented to the emergency room with left arm discomfort paresthesia hypertension and a history of some weakness of her face.  CT scan of her head was negative.  Labs were normal. Formatting of this note might be different from the original. Overview:  Overview:  STORY: She presented to the emergency room with left arm discom   Syncope 02/09/2019   Tendonitis    Toxic effect of venom 12/16/2011   Formatting of this note might be different from the original. STORY: Dr Joesph July:  always carry epi pen.   Vitamin D deficiency    history of    Patient Active Problem List   Diagnosis Date Noted   Coronary artery disease non-STEMI in July 2022 but cardiac catheterization only 25% ostial LAD 02/25/2021   Obesity 11/21/2020   Vitamin D deficiency 11/04/2020   Tendonitis 11/04/2020   Multiple food allergies 11/04/2020   Disease of eye characterized by increased eye pressure 11/04/2020   Breast fibroadenoma, left 11/04/2020   Arthritis 11/04/2020   NSTEMI (non-ST elevated myocardial infarction) ([place]) 10/06/2020   Restless leg syndrome 05/15/2019   Unstable angina ([facility])    Abnormal stress test    Chest pain 02/09/2019   Hypoglycemia 02/09/2019   Syncope 02/09/2019   PFO (patent foramen ovale) 09/22/2018   OSA on CPAP 04/19/2018   Pain in left knee 12/31/2017   DOE (dyspnea on exertion) 11/22/2017   Genetic testing 07/12/2017   Breast mass, left 06/29/2017   At high risk for breast cancer 06/29/2017   Screening for cervical cancer 12/19/2016   Right upper quadrant abdominal pain 07/15/2016   Type 2 diabetes mellitus without complication, without long-term current use of insulin ([place]) 06/16/2016   History of colon polyps 04/2016   Elevated glucose 03/02/2016   History of vitamin D deficiency 03/02/2016   Family history of malignant neoplasm of breast 01/27/2016   Abnormal mammogram of left breast 01/20/2016   Asthma 12/17/2015   Fibromyalgia 12/17/2015   Seasonal allergic rhinitis due to pollen 12/16/2015   Personal history of tobacco use, presenting hazards to health 06/27/2013   Insomnia 02/06/2013   Reactive depression 02/06/2013   Unspecified constipation 02/06/2013   Anemia, iron deficiency 02/06/2013   Expected blood loss anemia 02/01/2013   Morbid obesity with BMI of 40.0-44.9, adult ([place]) 02/01/2013   S/P right TKA 01/31/2013   S/P left TKA 01/31/2013   Osteoarthritis of knee 12/19/2012   Skin sensation disturbance 12/03/2012   Headache(784.0)  11/28/2012   Stroke-like symptoms 11/26/2012   Toxic effect of venom 12/16/2011   Backache 03/06/2009   MDD (major depressive disorder) 03/05/2009   Hypertension associated with diabetes ([place]) 03/05/2009   FATTY LIVER DISEASE 03/05/2009   GERD (gastroesophageal reflux disease) 11/29/2008   Anxiety state 10/31/2008   History of MRSA infection 2007    Past Surgical History:  Procedure Laterality Date   ABDOMINAL HYSTERECTOMY     BOWEL RESECTION     CESAREAN SECTION     x4   COLONOSCOPY W/ POLYPECTOMY  04/2016   KNEE SURGERY     Right knee scope 8'12   LEFT HEART CATH AND CORONARY ANGIOGRAPHY N/A 02/13/2019   Procedure: LEFT HEART CATH AND CORONARY ANGIOGRAPHY;  Surgeon: Nelva Bush, MD;  Location: [facility];  Service: Cardiovascular;  Laterality: N/A;   LEFT HEART CATH AND CORONARY ANGIOGRAPHY  N/A 10/08/2020   Procedure: LEFT HEART CATH AND CORONARY ANGIOGRAPHY;  Surgeon: Martinique, Peter M, MD;  Location: [facility];  Service: Cardiovascular;  Laterality: N/A;   TOTAL KNEE ARTHROPLASTY Right 01/31/2013   Procedure: RIGHT TOTAL KNEE ARTHROPLASTY;  Surgeon: [name], MD;  Location: [facility];  Service: Orthopedics;  Laterality: Right;   TOTAL KNEE ARTHROPLASTY Left 02/15/2018   Procedure: LEFT TOTAL KNEE ARTHROPLASTY;  Surgeon: Paralee Cancel, MD;  Location: [facility];  Service: Orthopedics;  Laterality: Left;  68     OB History   No obstetric history on file.     Family History  Problem Relation Age of Onset   Lung cancer Mother    Diabetes Father    Colon cancer Neg Hx     Social History   Tobacco Use   Smoking status: Former    Types: Cigarettes    Quit date: 01/25/2005    Years since quitting: 16.1   Smokeless tobacco: Never  Vaping Use   Vaping Use: Never used  Substance Use Topics   Alcohol use: No   Drug use: No    Home Medications Prior to Admission medications   Medication Sig Start Date End Date Taking? Authorizing Provider   amLODipine (NORVASC) 10 MG tablet Take 1 tablet (10 mg total) by mouth daily. 02/25/21   Park Liter, MD  aspirin 81 MG chewable tablet Chew 81 mg by mouth daily.    [provider]  atorvastatin (LIPITOR) 10 MG tablet Take 10 mg by mouth at bedtime.    [provider]  BREO ELLIPTA 200-25 MCG/INH AEPB Inhale 1 puff into the lungs daily. 10/30/20   [provider]  calcium carbonate (OS-CAL - DOSED IN MG OF ELEMENTAL CALCIUM) 1250 (500 Ca) MG tablet Take 1 tablet by mouth daily with breakfast. Unknown strenght    [provider]  carvedilol (COREG) 6.25 MG tablet Take 1 tablet (6.25 mg total) by mouth 2 (two) times daily with a meal. 02/14/19   Bonnell Public, MD  cholecalciferol (VITAMIN D3) 25 MCG (1000 UNIT) tablet Take 1,000 Units by mouth daily.    [provider]  cyclobenzaprine (FLEXERIL) 10 MG tablet Take 10 mg by mouth at bedtime as needed for muscle spasms. 09/28/20   [provider]  desvenlafaxine (PRISTIQ) 100 MG 24 hr tablet Take 100 mg by mouth daily. 10/02/20   [provider]  diazepam (VALIUM) 10 MG tablet Take 10 mg by mouth 4 (four) times daily.    [provider]  EPINEPHrine 0.3 mg/0.3 mL IJ SOAJ injection Inject 0.3 mg into the muscle as needed for anaphylaxis. 06/21/20   Breck Coons, MD  fluticasone [facility]) 50 MCG/ACT nasal spray Place 2 sprays into both nostrils daily as needed for allergies.     [provider]  furosemide (LASIX) 20 MG tablet Take 10 mg by mouth daily. 09/04/20   [provider]  Insulin Glargine (BASAGLAR KWIKPEN) 100 UNIT/ML Inject 30 Units into the skin daily. 11/07/20   [provider]  montelukast (SINGULAIR) 10 MG tablet Take 10 mg by mouth daily. 10/06/17   [provider]  nitroGLYCERIN (NITROSTAT) 0.3 MG SL tablet Place 0.3 mg under the tongue as needed for chest pain. 11/07/20   [provider]  omeprazole (PRILOSEC) 20 MG  capsule Take 20 mg by mouth daily.    [provider]  OXYGEN Inhale 3 L into the lungs continuous.    [provider]  polyethylene glycol (MIRALAX / GLYCOLAX) 17 g packet Take 17 g by mouth daily.    [provider]  QUEtiapine (SEROQUEL) 200 MG tablet Take 200 mg by mouth at bedtime. 09/28/20   [provider]  raloxifene (EVISTA) 60 MG tablet Take 60 mg by mouth daily. 02/14/19   [provider]  ranolazine (RANEXA) 1000 MG SR tablet Take 1 tablet (1,000 mg total) by mouth 2 (two) times daily. 02/25/21   Park Liter, MD  rOPINIRole (REQUIP) 2 MG tablet Take 2 mg by mouth at bedtime. 08/20/20   [provider]  TRULICITY 3 RK/2.7CW SOPN Inject 3 mg into the skin once a week. Tuesday's 02/10/21   [provider]    Allergies    Banana, Cannabinoids, Dog epithelium, Grass pollen(k-o-r-t-swt vern), Latex, Lisinopril-hydrochlorothiazide, Onion, Penicillins, Tobacco, Tylenol [acetaminophen], American cockroach, Cat hair extract, Murine-derived products, Codeine, Hydrocodone, and Insect wax  Review of Systems   Review of Systems Ten systems reviewed and are negative for acute change, except as noted in the HPI.   Physical Exam Updated Vital Signs BP (!) 135/92 (BP Location: Right Arm)   Pulse 89   Temp 99 F (37.2 C)   Resp 20   SpO2 94%   Physical Exam Vitals and nursing note reviewed.  Constitutional:      General: She is not in acute distress.    Appearance: She is well-developed. She is not diaphoretic.  HENT:     Head: Normocephalic and atraumatic.     Right Ear: External ear normal.     Left Ear: External ear normal.     Nose: Nose normal.     Mouth/Throat:     Mouth: Mucous membranes are moist.  Eyes:     General: Visual field deficit present. No scleral icterus.    Conjunctiva/sclera: Conjunctivae normal.  Cardiovascular:     Rate and Rhythm: Normal rate and regular rhythm.     Heart sounds: Normal  heart sounds. No murmur heard.   No friction rub. No gallop.  Pulmonary:     Effort: Pulmonary effort is normal. No respiratory distress.     Breath sounds: Normal breath sounds.  Abdominal:     General: Bowel sounds are normal. There is no distension.     Palpations: Abdomen is soft. There is no mass.     Tenderness: There is no abdominal tenderness. There is no guarding.  Musculoskeletal:     Cervical back: Normal range of motion.  Skin:    General: Skin is warm and dry.  Neurological:     Mental Status: She is alert.     GCS: GCS eye subscore is 4. GCS verbal subscore is 5. GCS motor subscore is 6.     Cranial Nerves: Facial asymmetry present.     Sensory: Sensory deficit present.     Motor: Weakness (Left arm and leg) present. No pronator drift.     Coordination: Finger-Nose-Finger Test abnormal.  Psychiatric:        Behavior: Behavior normal.    ED Results / Procedures / Treatments   Labs (all labs ordered are listed, but only abnormal results are displayed) Labs Reviewed  CBC - Abnormal; Notable for the following components:      Result Value   Platelets 459 (*)    All other components within normal limits  I-STAT CHEM 8, ED - Abnormal; Notable for the following components:   Calcium, Ion 1.07 (*)    All other  components within normal limits  RESP PANEL BY RT-PCR (FLU A&B, COVID) ARPGX2  PROTIME-INR  APTT  DIFFERENTIAL  ETHANOL  COMPREHENSIVE METABOLIC PANEL  RAPID URINE DRUG SCREEN, HOSP PERFORMED  URINALYSIS, ROUTINE W REFLEX MICROSCOPIC  I-STAT BETA HCG BLOOD, ED ([facility], [facility], [facility] ONLY)  CBG MONITORING, ED  TROPONIN I (HIGH SENSITIVITY)    EKG EKG Interpretation  Date/Time:  Friday March 14 2021 18:57:37 EST Ventricular Rate:  86 PR Interval:  146 QRS Duration: 96 QT Interval:  402 QTC Calculation: 481 R Axis:   -45 Text Interpretation: Normal sinus rhythm Pulmonary disease pattern Left anterior fascicular block Prolonged QT Abnormal ECG Confirmed by  Ripley Fraise [phone]) on 03/15/2021 12:04:43 AM  Radiology CT HEAD CODE STROKE WO CONTRAST  Result Date: 03/14/2021 CLINICAL DATA:  Code stroke. EXAM: CT HEAD WITHOUT CONTRAST TECHNIQUE: Contiguous axial images were obtained from the base of the skull through the vertex without intravenous contrast. COMPARISON:  09/28/2017. FINDINGS: Brain: No evidence of acute infarction, hemorrhage, cerebral edema, mass, mass effect, or midline shift. Ventricles and sulci are normal for age. No extra-axial fluid collection. Vascular: No hyperdense vessel or unexpected calcification. Skull: Normal. Negative for fracture or focal lesion. Sinuses/Orbits: No acute finding. Other: The mastoid air cells are well aerated. ASPECTS [facility] Stroke Program Early CT Score) - Ganglionic level infarction (caudate, lentiform nuclei, internal capsule, insula, M1-M3 cortex): 7 - Supraganglionic infarction (M4-M6 cortex): 3 Total score (0-10 with 10 being normal): 10 IMPRESSION: 1. No acute intracranial process. 2. ASPECTS is 10 Code stroke imaging results were communicated on 03/14/2021 at 7:44 pm to provider Dr. Leonel Ramsay via secure text paging. Electronically Signed   By: Merilyn Baba M.D.   On: 03/14/2021 19:45    Procedures Procedures   Medications Ordered in ED Medications  iohexol (OMNIPAQUE) 350 MG/ML injection 50 mL (50 mLs Intravenous Contrast Given 03/14/21 1950)    ED Course  I have reviewed the triage vital signs and the nursing notes.  Pertinent labs & imaging results that were available during my care of the patient were reviewed by me and considered in my medical decision making (see chart for details).    MDM Rules/Calculators/A&P                          55 year old female who presented with left-sided weakness. The differential diagnosis of weakness includes but is not limited to neurologic causes (GBS, myasthenia gravis, CVA, MS, ALS, transverse myelitis, spinal cord injury, CVA, botulism, ) and  other causes: ACS, Arrhythmia, syncope, orthostatic hypotension, sepsis, hypoglycemia, electrolyte disturbance, hypothyroidism, respiratory failure, symptomatic anemia, dehydration, heat injury, polypharmacy, malignancy. Patient was called as a code stroke. Imaging here including CT head, CT angiogram of the head and neck and MRI of the brain negative.  I personally reviewed these images.  She does not appear to have an acute stroke. I also ordered and reviewed labs that included CBC, CMP, troponin x2, ethanol, APTT, PT/INR all within normal limits.  EKG shows normal sinus rhythm at a rate of 86 with prolonged QT.  Patient does not appear to have ACS.  This could be complicated migraine.  Patient will be discharged to follow-up with neurology and outpatient cardiology.  Discussed outpatient follow-up and return precautions.  Patient symptoms have resolved at this time. Final Clinical Impression(s) / ED Diagnoses Final diagnoses:  Hemiparesthesia  Atypical chest pain    Rx / DC Orders ED Discharge Orders     None  Margarita Mail, PA-C 03/17/21 1719    Malvin Johns, MD 03/20/21 1504

## 2021-03-14 NOTE — ED Notes (Signed)
To mri 

## 2021-03-14 NOTE — ED Notes (Signed)
The pt woke up at 630-700 with a headache followed by chest pain.  She took a sl nitro for her chest pain.  She had a headache  prior to taking the nitro  she went back to bed and whenever she woke up  around 1400 she still had chest pain and a headache. She also woke up with her rt hand swollen and pain in her rt foot which she still has that and she also is c/o numbness and tingling of her lt arm and leg   alert oriented x 4  moves all extremities

## 2021-03-14 NOTE — ED Notes (Signed)
The pt reports that she still has a headache and chest pain  10/10   she still has some numbness in her rt hand and rt foot

## 2021-03-15 LAB — TROPONIN I (HIGH SENSITIVITY): Troponin I (High Sensitivity): 3 ng/L (ref <18)

## 2021-03-15 NOTE — ED Provider Notes (Signed)
I assumed care in signout.  All labs are negative.  MRI imaging is negative.  Patient is awake alert and moves all EXTR x4.  Per neuro hospitalist, she should follow-up as an outpatient with neurology. Patient feels comfortable for discharge home.   EKG Interpretation  Date/Time:  Friday March 14 2021 18:57:37 EST Ventricular Rate:  86 PR Interval:  146 QRS Duration: 96 QT Interval:  402 QTC Calculation: 481 R Axis:   -45 Text Interpretation: Normal sinus rhythm Pulmonary disease pattern Left anterior fascicular block Prolonged QT Abnormal ECG Confirmed by Ripley Fraise [phone]) on 03/15/2021 12:04:43 AM       BP 117/87   Pulse 88   Temp 99 F (37.2 C)   Resp (!) 26   SpO2 97%      Ripley Fraise, MD 03/15/21 0119

## 2021-03-15 NOTE — Discharge Instructions (Addendum)
Your caregiver has diagnosed you as having chest pain that is not specific for one problem, but does not require admission.  You are at low risk for an acute heart condition or other serious illness. Chest pain comes from many different causes.  SEEK IMMEDIATE MEDICAL ATTENTION IF: You have severe chest pain, especially if the pain is crushing or pressure-like and spreads to the arms, back, neck, or jaw, or if you have sweating, nausea (feeling sick to your stomach), or shortness of breath. THIS IS AN EMERGENCY. Don't wait to see if the pain will go away. Get medical help at once. Call 911 or 0 (operator). DO NOT drive yourself to the hospital.  Your chest pain gets worse and does not go away with rest.  You have an attack of chest pain lasting longer than usual, despite rest and treatment with the medications your caregiver has prescribed.  You wake from sleep with chest pain or shortness of breath.  You feel dizzy or faint.  You have chest pain not typical of your usual pain for which you originally saw your caregiver. Get help right away if you: Feel muscle weakness. Develop new weakness in an arm or leg. Have trouble walking or moving. Have problems with speech, understanding, or vision. Feel confused. Cannot control your bladder or bowel movements.

## 2021-04-14 ENCOUNTER — Ambulatory Visit: Payer: Medicare Other | Admitting: Cardiology

## 2021-04-21 ENCOUNTER — Telehealth: Payer: Self-pay | Admitting: Cardiology

## 2021-04-21 NOTE — Telephone Encounter (Signed)
° °  Pt said when she changed her insurance to Part B advantage plan they put Dr. Raliegh Ip as her pcp due to her locations. Not she needs her diabetic supplies and [facility] sent an order request from Dr. Raliegh Ip. She said it was sent out to Dr. Raliegh Ip last week

## 2021-04-21 NOTE — Telephone Encounter (Signed)
Called patient to get more information. Patient stated she had made Dr. Agustin Cree her PCP so he could order her diabetic supplies. Dr. Agustin Cree stated that he doesn't do that and he can't be her PCP. I explained to the patient she would need to select another PCP and have them order her diabetic supplies. Patient understands and is agreeable to this.

## 2021-05-27 ENCOUNTER — Encounter: Payer: Self-pay | Admitting: Gastroenterology

## 2021-07-24 ENCOUNTER — Ambulatory Visit: Payer: Medicaid Other | Admitting: Cardiology

## 2021-07-28 ENCOUNTER — Other Ambulatory Visit: Payer: Self-pay | Admitting: Cardiology

## 2021-08-14 ENCOUNTER — Ambulatory Visit (INDEPENDENT_AMBULATORY_CARE_PROVIDER_SITE_OTHER): Payer: Medicare HMO | Admitting: Cardiology

## 2021-08-14 ENCOUNTER — Encounter: Payer: Self-pay | Admitting: Cardiology

## 2021-08-14 VITALS — BP 114/62 | HR 92 | Ht 59.5 in | Wt 210.0 lb

## 2021-08-14 DIAGNOSIS — I509 Heart failure, unspecified: Secondary | ICD-10-CM | POA: Insufficient documentation

## 2021-08-14 DIAGNOSIS — F411 Generalized anxiety disorder: Secondary | ICD-10-CM

## 2021-08-14 DIAGNOSIS — I25118 Atherosclerotic heart disease of native coronary artery with other forms of angina pectoris: Secondary | ICD-10-CM

## 2021-08-14 DIAGNOSIS — Q2112 Patent foramen ovale: Secondary | ICD-10-CM | POA: Diagnosis not present

## 2021-08-14 DIAGNOSIS — R0609 Other forms of dyspnea: Secondary | ICD-10-CM

## 2021-08-14 DIAGNOSIS — E1159 Type 2 diabetes mellitus with other circulatory complications: Secondary | ICD-10-CM

## 2021-08-14 DIAGNOSIS — I5032 Chronic diastolic (congestive) heart failure: Secondary | ICD-10-CM

## 2021-08-14 DIAGNOSIS — I152 Hypertension secondary to endocrine disorders: Secondary | ICD-10-CM

## 2021-08-14 NOTE — Addendum Note (Signed)
Addended by: Jacobo Forest D on: 08/14/2021 02:23 PM ? ? Modules accepted: Orders ? ?

## 2021-08-14 NOTE — Progress Notes (Addendum)
?Cardiology Office Note:   ? ?Date:  08/14/2021  ? ?ID:  [name], DOB 1965-04-22, MRN [id number] ? ?PCP:  Bernerd Limbo, MD  ?Cardiologist:  Jenne Campus, MD   ? ?Referring MD: Bernerd Limbo, MD  ? ?Chief Complaint  ?Patient presents with  ? fluid retention  ?  Ongoing for 2 weeks. Increased lasix to $Remove'40mg'FOTAuRf$  and is doing better   ? ? ?History of Present Illness:   ? ?[name] is a 56 y.o. female I met her first time last year when she was in the hospital because of atypical chest pain, eventually end up having minimally elevated troponin she was sent to [facility] for cardiac catheterization cardiac catheterization showed only 20% lesion of proximal LAD conclusion at that time was that spell of troponin was related probably to vasospasm she may have occluded very small branch.  She was put on appropriate medications and discharged home.  She said I did see her 1 time so far she did quite well however now she requested to be seen because she noted swelling of lower extremities she gained about 20 pounds, she was given small dose of diuretics only 20 mg Lasix daily and she improved significantly.  Still she said she gets 6 more pounds to go.  Denies have any chest pain tightness squeezing pressure mid chest and shortness of breath some proximal nocturnal dyspnea. ? ?Past Medical History:  ?Diagnosis Date  ? Abnormal mammogram of left breast 01/20/2016  ? Abnormal stress test   ? Anemia, iron deficiency 02/06/2013  ? Anxiety   ? Anxiety state 10/31/2008  ? Arthritis   ? osteoarthritis-knees  ? Asthma   ? Breast fibroadenoma, left   ? Breast mass, left 06/29/2017  ? Chest pain 02/09/2019  ? Depression   ? Diabetes mellitus without complication (Axis)   ? 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 no eye drops used-some increased pressure  ? DOE (dyspnea on exertion) 11/22/2017  ? Elevated glucose 03/02/2016  ? Fibromyalgia 12/17/2015  ? Genetic testing 07/12/2017  ? Formatting of this note might be  different from the original. Negative Breast Cancer STAT panel (9 genes) through [facility]  Last Assessment & Plan:  Formatting of this note might be different from the original.  ? GERD (gastroesophageal reflux disease)   ? Headache(784.0)   ? occ. with allergies/sinus issues  ? History of colon polyps 04/2016  ? History of MRSA infection 2007  ? History of vitamin D deficiency 03/02/2016  ? Hypertension   ? no problems since weight loss -'09  ? Insomnia 02/06/2013  ? Multiple food allergies   ? Latex allergy-respiratory  ? Obesity   ? Pain in left knee 12/31/2017  ? Personal history of tobacco use, presenting hazards to health 06/27/2013  ? Restless leg syndrome 05/15/2019  ? Right upper quadrant abdominal pain 07/15/2016  ? S/P left TKA 01/31/2013  ? Screening for cervical cancer 12/19/2016  ? Formatting of this note might be different from the original. Status post hysterectomy  ? Skin sensation disturbance 12/03/2012  ? Formatting of this note might be different from the original. STORY: She presented to the emergency room with left arm discomfort paresthesia hypertension and a history of some weakness of her face.  CT scan of her head was negative.  Labs were normal. Formatting of this note might be different from the original. Overview:  Overview:  STORY: She presented to the emergency room with left arm discom  ?  Syncope 02/09/2019  ? Tendonitis   ? Toxic effect of venom 12/16/2011  ? Formatting of this note might be different from the original. STORY: Dr Joesph July: always carry epi pen.  ? Vitamin D deficiency   ? history of  ? ? ?Past Surgical History:  ?Procedure Laterality Date  ? ABDOMINAL HYSTERECTOMY    ? BOWEL RESECTION    ? CESAREAN SECTION    ? x4  ? COLONOSCOPY W/ POLYPECTOMY  04/2016  ? KNEE SURGERY    ? Right knee scope 8'12  ? LEFT HEART CATH AND CORONARY ANGIOGRAPHY N/A 02/13/2019  ? Procedure: LEFT HEART CATH AND CORONARY ANGIOGRAPHY;  Surgeon: Nelva Bush, MD;  Location: [place]  [facility];  Service: Cardiovascular;  Laterality: N/A;  ? LEFT HEART CATH AND CORONARY ANGIOGRAPHY N/A 10/08/2020  ? Procedure: LEFT HEART CATH AND CORONARY ANGIOGRAPHY;  Surgeon: Martinique, Peter M, MD;  Location: [place] [facility];  Service: Cardiovascular;  Laterality: N/A;  ? TOTAL KNEE ARTHROPLASTY Right 01/31/2013  ? Procedure: RIGHT TOTAL KNEE ARTHROPLASTY;  Surgeon: [name], MD;  Location: [facility];  Service: Orthopedics;  Laterality: Right;  ? TOTAL KNEE ARTHROPLASTY Left 02/15/2018  ? Procedure: LEFT TOTAL KNEE ARTHROPLASTY;  Surgeon: Paralee Cancel, MD;  Location: [facility];  Service: Orthopedics;  Laterality: Left;  68  ? ? ?Current Medications: ?Current Meds  ?Medication Sig  ? amLODipine (NORVASC) 10 MG tablet Take 1 tablet (10 mg total) by mouth daily.  ? aspirin 81 MG chewable tablet Chew 81 mg by mouth daily.  ? atorvastatin (LIPITOR) 10 MG tablet Take 10 mg by mouth at bedtime.  ? BREO ELLIPTA 200-25 MCG/INH AEPB Inhale 1 puff into the lungs daily.  ? calcium carbonate (OS-CAL - DOSED IN MG OF ELEMENTAL CALCIUM) 1250 (500 Ca) MG tablet Take 1 tablet by mouth daily with breakfast. Unknown strenght  ? carvedilol (COREG) 6.25 MG tablet Take 1 tablet (6.25 mg total) by mouth 2 (two) times daily with a meal.  ? cholecalciferol (VITAMIN D3) 25 MCG (1000 UNIT) tablet Take 1,000 Units by mouth daily.  ? cyclobenzaprine (FLEXERIL) 10 MG tablet Take 10 mg by mouth at bedtime as needed for muscle spasms.  ? desvenlafaxine (PRISTIQ) 100 MG 24 hr tablet Take 100 mg by mouth daily.  ? diazepam (VALIUM) 10 MG tablet Take 10 mg by mouth 4 (four) times daily.  ? EPINEPHrine 0.3 mg/0.3 mL IJ SOAJ injection Inject 0.3 mg into the muscle as needed for anaphylaxis.  ? fluticasone (FLONASE) 50 MCG/ACT nasal spray Place 2 sprays into both nostrils daily as needed for allergies.   ? furosemide (LASIX) 20 MG tablet Take 40 mg by mouth daily.  ? Insulin Glargine (BASAGLAR KWIKPEN) 100 UNIT/ML Inject 30 Units into the skin daily.   ? montelukast (SINGULAIR) 10 MG tablet Take 10 mg by mouth daily.  ? nitroGLYCERIN (NITROSTAT) 0.3 MG SL tablet Place 0.3 mg under the tongue as needed for chest pain.  ? omeprazole (PRILOSEC) 20 MG capsule Take 20 mg by mouth daily.  ? OXYGEN Inhale 3 L into the lungs continuous.  ? polyethylene glycol (MIRALAX / GLYCOLAX) 17 g packet Take 17 g by mouth daily.  ? QUEtiapine (SEROQUEL) 200 MG tablet Take 200 mg by mouth at bedtime.  ? raloxifene (EVISTA) 60 MG tablet Take 60 mg by mouth daily.  ? ranolazine (RANEXA) 1000 MG SR tablet Take 1 tablet (1,000 mg total) by mouth 2 (two) times daily.  ? rOPINIRole (REQUIP) 4  MG tablet Take 4 mg by mouth at bedtime.  ? TRULICITY 3 DH/7.8XB SOPN Inject 3 mg into the skin every Tuesday.  ?  ? ?Allergies:   Banana, Cannabinoids, Dog epithelium, Grass pollen(k-o-r-t-swt vern), Latex, Lisinopril-hydrochlorothiazide, Onion, Penicillins, Tobacco, Tylenol [acetaminophen], American cockroach, Cat hair extract, Murine-derived products, Codeine, Hydrocodone, and Insect wax  ? ?Social History  ? ?Socioeconomic History  ? Marital status: Married  ?  Spouse name: antonio  ? Number of children: 6  ? Years of education: college  ? Highest education level: Not on file  ?Occupational History  ? Occupation: N/A  ?Tobacco Use  ? Smoking status: Former  ?  Types: Cigarettes  ?  Quit date: 01/25/2005  ?  Years since quitting: 16.5  ? Smokeless tobacco: Never  ?Vaping Use  ? Vaping Use: Never used  ?Substance and Sexual Activity  ? Alcohol use: No  ? Drug use: No  ? Sexual activity: Yes  ?  Partners: Male  ?Other Topics Concern  ? Not on file  ?Social History Narrative  ? Not on file  ? ?Social Determinants of Health  ? ?Financial Resource Strain: Not on file  ?Food Insecurity: Not on file  ?Transportation Needs: Not on file  ?Physical Activity: Not on file  ?Stress: Not on file  ?Social Connections: Not on file  ?  ? ?Family History: ?The patient's family history includes Diabetes in her  father; Lung cancer in her mother. There is no history of Colon cancer. ?ROS:   ?Please see the history of present illness.    ?All 14 point review of systems negative except as described per history of present il

## 2021-08-14 NOTE — Patient Instructions (Addendum)
Medication Instructions:  ?Your physician recommends that you continue on your current medications as directed. Please refer to the Current Medication list given to you today.  ?*If you need a refill on your cardiac medications before your next appointment, please call your pharmacy* ? ? ?Lab Work: ?[place], [place] ?If you have labs (blood work) drawn today and your tests are completely normal, you will receive your results only by: ?MyChart Message (if you have MyChart) OR ?A paper copy in the mail ?If you have any lab test that is abnormal or we need to change your treatment, we will call you to review the results. ? ? ?Testing/Procedures: ?Chest Xray - [place] in at outpatient entrance ? ?Your physician has requested that you have an echocardiogram. Echocardiography is a painless test that uses sound waves to create images of your heart. It provides your doctor with information about the size and shape of your heart and how well your heart?s chambers and valves are working. This procedure takes approximately one hour. There are no restrictions for this procedure.  ? ? ?Follow-Up: ?At [facility], you and your health needs are our priority.  As part of our continuing mission to provide you with exceptional heart care, we have created designated Provider Care Teams.  These Care Teams include your primary Cardiologist (physician) and Advanced Practice Providers (APPs -  Physician Assistants and Nurse Practitioners) who all work together to provide you with the care you need, when you need it. ? ?We recommend signing up for the patient portal called "MyChart".  Sign up information is provided on this After Visit Summary.  MyChart is used to connect with patients for Virtual Visits (Telemedicine).  Patients are able to view lab/test results, encounter notes, upcoming appointments, etc.  Non-urgent messages can be sent to your provider as well.   ?To learn more about what you can do with MyChart,  go to [email or web address].   ? ?Your next appointment:   ?6 week(s) ? ?The format for your next appointment:   ?In Person ? ?Provider:   ?Jenne Campus, MD  ? ? ?Other Instructions ?NA  ?

## 2021-08-15 LAB — COMPREHENSIVE METABOLIC PANEL
ALT: 11 IU/L (ref 0–32)
AST: 12 IU/L (ref 0–40)
Albumin/Globulin Ratio: 1.5 (ref 1.2–2.2)
Albumin: 4.3 g/dL (ref 3.8–4.9)
Alkaline Phosphatase: 103 IU/L (ref 44–121)
BUN/Creatinine Ratio: 9 (ref 9–23)
BUN: 8 mg/dL (ref 6–24)
Bilirubin Total: <0.2 mg/dL (ref 0.0–1.2)
CO2: 22 mmol/L (ref 20–29)
Calcium: 9.2 mg/dL (ref 8.7–10.2)
Chloride: 106 mmol/L (ref 96–106)
Creatinine, Ser: 0.88 mg/dL (ref 0.57–1.00)
Globulin, Total: 2.9 g/dL (ref 1.5–4.5)
Glucose: 132 mg/dL — ABNORMAL HIGH (ref 70–99)
Potassium: 4.2 mmol/L (ref 3.5–5.2)
Sodium: 144 mmol/L (ref 134–144)
Total Protein: 7.2 g/dL (ref 6.0–8.5)
eGFR: 77 mL/min/{1.73_m2} (ref >59)

## 2021-08-15 LAB — PRO B NATRIURETIC PEPTIDE: NT-Pro BNP: <36 pg/mL (ref 0–287)

## 2021-08-20 ENCOUNTER — Other Ambulatory Visit: Payer: Medicare HMO

## 2021-08-28 ENCOUNTER — Emergency Department (HOSPITAL_COMMUNITY)
Admission: EM | Admit: 2021-08-28 | Discharge: 2021-08-29 | Disposition: A | Discharge status: Home / Self Care | Payer: Medicare HMO | Attending: Emergency Medicine | Admitting: Emergency Medicine

## 2021-08-28 ENCOUNTER — Emergency Department (HOSPITAL_COMMUNITY): Payer: Medicare HMO

## 2021-08-28 ENCOUNTER — Encounter (HOSPITAL_COMMUNITY): Payer: Self-pay | Admitting: Emergency Medicine

## 2021-08-28 ENCOUNTER — Other Ambulatory Visit: Payer: Medicare HMO

## 2021-08-28 DIAGNOSIS — R202 Paresthesia of skin: Secondary | ICD-10-CM | POA: Insufficient documentation

## 2021-08-28 DIAGNOSIS — Z91040 Latex allergy status: Secondary | ICD-10-CM | POA: Diagnosis not present

## 2021-08-28 DIAGNOSIS — R61 Generalized hyperhidrosis: Secondary | ICD-10-CM | POA: Diagnosis not present

## 2021-08-28 DIAGNOSIS — R519 Headache, unspecified: Secondary | ICD-10-CM | POA: Insufficient documentation

## 2021-08-28 DIAGNOSIS — I10 Essential (primary) hypertension: Secondary | ICD-10-CM | POA: Insufficient documentation

## 2021-08-28 DIAGNOSIS — Z79899 Other long term (current) drug therapy: Secondary | ICD-10-CM | POA: Insufficient documentation

## 2021-08-28 DIAGNOSIS — R0789 Other chest pain: Secondary | ICD-10-CM | POA: Insufficient documentation

## 2021-08-28 DIAGNOSIS — R0602 Shortness of breath: Secondary | ICD-10-CM | POA: Diagnosis not present

## 2021-08-28 DIAGNOSIS — E119 Type 2 diabetes mellitus without complications: Secondary | ICD-10-CM | POA: Insufficient documentation

## 2021-08-28 DIAGNOSIS — R110 Nausea: Secondary | ICD-10-CM | POA: Diagnosis not present

## 2021-08-28 DIAGNOSIS — Z794 Long term (current) use of insulin: Secondary | ICD-10-CM | POA: Insufficient documentation

## 2021-08-28 DIAGNOSIS — Z7982 Long term (current) use of aspirin: Secondary | ICD-10-CM | POA: Insufficient documentation

## 2021-08-28 LAB — COMPREHENSIVE METABOLIC PANEL
ALT: 16 U/L (ref 0–44)
AST: 19 U/L (ref 15–41)
Albumin: 4.1 g/dL (ref 3.5–5.0)
Alkaline Phosphatase: 89 U/L (ref 38–126)
Anion gap: 9 (ref 5–15)
BUN: 8 mg/dL (ref 6–20)
CO2: 25 mmol/L (ref 22–32)
Calcium: 9.7 mg/dL (ref 8.9–10.3)
Chloride: 107 mmol/L (ref 98–111)
Creatinine, Ser: 0.74 mg/dL (ref 0.44–1.00)
GFR, Estimated: >60 mL/min (ref >60)
Glucose, Bld: 137 mg/dL — ABNORMAL HIGH (ref 70–99)
Potassium: 3.5 mmol/L (ref 3.5–5.1)
Sodium: 141 mmol/L (ref 135–145)
Total Bilirubin: 0.5 mg/dL (ref 0.3–1.2)
Total Protein: 8.3 g/dL — ABNORMAL HIGH (ref 6.5–8.1)

## 2021-08-28 LAB — CBC WITH DIFFERENTIAL/PLATELET
Abs Immature Granulocytes: 0.03 10*3/uL (ref 0.00–0.07)
Basophils Absolute: 0 10*3/uL (ref 0.0–0.1)
Basophils Relative: 1 %
Eosinophils Absolute: 0.1 10*3/uL (ref 0.0–0.5)
Eosinophils Relative: 2 %
HCT: 42.8 % (ref 36.0–46.0)
Hemoglobin: 13.7 g/dL (ref 12.0–15.0)
Immature Granulocytes: 0 %
Lymphocytes Relative: 45 %
Lymphs Abs: 3.3 10*3/uL (ref 0.7–4.0)
MCH: 30.8 pg (ref 26.0–34.0)
MCHC: 32 g/dL (ref 30.0–36.0)
MCV: 96.2 fL (ref 80.0–100.0)
Monocytes Absolute: 0.4 10*3/uL (ref 0.1–1.0)
Monocytes Relative: 5 %
Neutro Abs: 3.5 10*3/uL (ref 1.7–7.7)
Neutrophils Relative %: 47 %
Platelets: 502 10*3/uL — ABNORMAL HIGH (ref 150–400)
RBC: 4.45 MIL/uL (ref 3.87–5.11)
RDW: 15.5 % (ref 11.5–15.5)
WBC: 7.3 10*3/uL (ref 4.0–10.5)
nRBC: 0 % (ref 0.0–0.2)

## 2021-08-28 LAB — TROPONIN I (HIGH SENSITIVITY)
Troponin I (High Sensitivity): <2 ng/L (ref <18)
Troponin I (High Sensitivity): <2 ng/L (ref <18)

## 2021-08-28 NOTE — ED Provider Notes (Signed)
Care patient handed off to me at this time from [place], [place].  Please see her note for full HPI and work-up up to this point.  Briefly, this is a 56 year old female who presents to the emergency department with chest pain.  Describes having chest pain that radiated toward her back as well as having nausea and diaphoresis.  Later in the day she describes having right-sided neck pain as well as right arm numbness and tingling.  She also had some right-sided facial numbness and lower extremity numbness.  She has been under a lot of stress.   Physical Exam  BP 120/80   Pulse 85   Temp 97.6 F (36.4 C) (Oral)   Resp (!) 21   SpO2 100%   Physical Exam Vitals and nursing note reviewed.  HENT:     Head: Normocephalic and atraumatic.  Eyes:     General: No scleral icterus.    Extraocular Movements: Extraocular movements intact.  Pulmonary:     Effort: Pulmonary effort is normal. No respiratory distress.  Musculoskeletal:     Cervical back: Neck supple.  Skin:    General: Skin is warm and dry.     Findings: No rash.  Neurological:     General: No focal deficit present.     Mental Status: She is alert and oriented to person, place, and time.  Psychiatric:        Mood and Affect: Mood normal.        Behavior: Behavior normal.        Thought Content: Thought content normal.        Judgment: Judgment normal.    Procedures  Procedures  ED Course / MDM    Medical Decision Making Amount and/or Complexity of Data Reviewed Labs: ordered. Radiology: ordered.   56 year old female who presents to the emergency department with chest pain and numbness and tingling to the right side of her body.  Her physical exam is unremarkable other than subjective sensation change to the right side of her face and right extremities.  Her EKG is without ischemia or infarction.  Troponin x2 is negative.  Doubt ACS.  Chest x-ray is unremarkable. CBC and CMP are within normal limits. She had a CT of the head  which was negative. Previous provider ordered MRI of the head and neck.  Both of these are negative.  On my reassessment she is in no acute distress, vitals are stable.  She is appropriate for discharge at this time.  We will have her follow-up with her PCP who can establish neurology follow-up if needed for recurrent symptoms o of numbness and tingling.  This may be stress related given her negative MRIs and work-up.  I have discussed this with the patient.  She verbalized understanding.  Given return precautions  DC      Mickie Hillier, PA-C 08/29/21 Horatio Pel, MD 09/02/21 [phone]

## 2021-08-28 NOTE — ED Triage Notes (Signed)
Chest pain starting today. She was scheduled to get echo today at cardiology but was in too much pain. R arm numbness and jaw. States CBGs have been labile. She has hx of pleural effusions and "fluid around heart". 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 She was stented then. She states pain feels similar.

## 2021-08-28 NOTE — ED Provider Notes (Signed)
[place] DEPT Provider Note   CSN: [id number] Arrival date & time: 08/28/21  1855     History Chief Complaint  Patient presents with   Chest Pain    [name] is a 56 y.o. female h/o MI, stroke, HTN, DM, anemia presents to the ED for evaluation of chest pain as well as numbness tingling to the right side of her body.  The patient reports that she woke up at 0630 this morning with chest pressure that was constant and radiated towards her back.  Additionally, she mentions she had some nausea and diaphoresis as well.  Denies any vomiting or lightheadedness.  She reports she has shortness of breath at baseline however did have some worsening shortness of breath on exertion today.  Additionally later on the day she reported she had some right-sided neck pain that resulted in some right arm numbness and tingling.  She has pain with movement of the shoulder.  She reports that around 09306/1000 she started to experience a left head pain that resulted in right-sided facial numbness, right arm numbness, and right lower extremity numbness.  The patient reports that the night prior she had some swelling in her lower extremities but has been improving today.  She reports that she has been under a lot of stress that she is in taking care of her grandkids.  She denies any trouble with her speech or with her walking.  Denies any abdominal pain, fevers, chills, cough, blurry vision.  Denies any trauma, falls, MVC's, or blunt hits.   Chest Pain Associated symptoms: diaphoresis, headache, nausea, numbness and shortness of breath   Associated symptoms: no abdominal pain, no fever, no palpitations and no vomiting       Home Medications Prior to Admission medications   Medication Sig Start Date End Date Taking? Authorizing Provider  amLODipine (NORVASC) 10 MG tablet Take 1 tablet (10 mg total) by mouth daily. 07/28/21  Yes Park Liter, MD  aspirin 81 MG chewable tablet  Chew 81 mg by mouth daily.   Yes [provider]  atorvastatin (LIPITOR) 10 MG tablet Take 10 mg by mouth at bedtime.   Yes [provider]  BREO ELLIPTA 200-25 MCG/INH AEPB Inhale 1 puff into the lungs daily. 10/30/20  Yes [provider]  calcium carbonate (OS-CAL - DOSED IN MG OF ELEMENTAL CALCIUM) 1250 (500 Ca) MG tablet Take 1 tablet by mouth daily with breakfast. Unknown strenght   Yes [provider]  carvedilol (COREG) 6.25 MG tablet Take 1 tablet (6.25 mg total) by mouth 2 (two) times daily with a meal. 02/14/19  Yes Dana Allan I, MD  cholecalciferol (VITAMIN D3) 25 MCG (1000 UNIT) tablet Take 1,000 Units by mouth daily.   Yes [provider]  cyclobenzaprine (FLEXERIL) 10 MG tablet Take 10 mg by mouth at bedtime as needed for muscle spasms. 09/28/20  Yes [provider]  desvenlafaxine (PRISTIQ) 100 MG 24 hr tablet Take 100 mg by mouth daily. 10/02/20  Yes [provider]  diazepam (VALIUM) 10 MG tablet Take 10 mg by mouth 4 (four) times daily.   Yes [provider]  EPINEPHrine 0.3 mg/0.3 mL IJ SOAJ injection Inject 0.3 mg into the muscle as needed for anaphylaxis. 06/21/20  Yes Breck Coons, MD  fluticasone [facility]) 50 MCG/ACT nasal spray Place 2 sprays into both nostrils daily as needed for allergies.    Yes [provider]  furosemide (LASIX) 20 MG tablet Take 20 mg  by mouth daily. 09/04/20  Yes [provider]  gabapentin (NEURONTIN) 100 MG capsule Take 1 capsule by mouth in the morning and at bedtime. 05/01/21  Yes [provider]  Insulin Glargine (BASAGLAR KWIKPEN) 100 UNIT/ML Inject 30 Units into the skin daily. 11/07/20  Yes [provider]  montelukast (SINGULAIR) 10 MG tablet Take 10 mg by mouth daily. 10/06/17  Yes [provider]  nitroGLYCERIN (NITROSTAT) 0.3 MG SL tablet Place 0.3 mg under the tongue as needed for chest pain. 11/07/20  Yes [provider]   omeprazole (PRILOSEC) 20 MG capsule Take 20 mg by mouth daily.   Yes [provider]  OXYGEN Inhale 3 L into the lungs continuous.   Yes [provider]  polyethylene glycol (MIRALAX / GLYCOLAX) 17 g packet Take 17 g by mouth daily.   Yes [provider]  QUEtiapine (SEROQUEL) 200 MG tablet Take 200 mg by mouth at bedtime. 09/28/20  Yes [provider]  raloxifene (EVISTA) 60 MG tablet Take 60 mg by mouth daily. 02/14/19  Yes [provider]  ranolazine (RANEXA) 1000 MG SR tablet Take 1 tablet (1,000 mg total) by mouth 2 (two) times daily. 07/28/21  Yes Park Liter, MD  rOPINIRole (REQUIP) 4 MG tablet Take 4 mg by mouth at bedtime. 08/20/20  Yes [provider]  TRULICITY 3 EN/2.7PO SOPN Inject 3 mg into the skin once a week. 02/10/21   [provider]      Allergies    Banana, Cannabinoids, Dog epithelium, Grass pollen([id number]-t-swt vern), Latex, Lisinopril-hydrochlorothiazide, Onion, Penicillins, Tobacco, Tylenol [acetaminophen], American cockroach, Cat hair extract, Murine-derived products, Codeine, Hydrocodone, and Insect wax    Review of Systems   Review of Systems  Constitutional:  Positive for diaphoresis. Negative for chills and fever.  Respiratory:  Positive for shortness of breath.   Cardiovascular:  Positive for chest pain. Negative for palpitations.  Gastrointestinal:  Positive for nausea. Negative for abdominal pain, constipation, diarrhea and vomiting.  Genitourinary:  Negative for dysuria and hematuria.  Musculoskeletal:  Positive for arthralgias.  Neurological:  Positive for numbness and headaches. Negative for syncope, facial asymmetry and speech difficulty.   Physical Exam Updated Vital Signs BP 137/88   Pulse 84   Temp 97.6 F (36.4 C) (Oral)   Resp (!) 25   SpO2 100%  Physical Exam Vitals and nursing note reviewed.  Constitutional:      General: She is not in acute distress.    Appearance: Normal  appearance. She is not ill-appearing, toxic-appearing or diaphoretic.  HENT:     Head: Normocephalic and atraumatic.  Eyes:     General: No scleral icterus.    Extraocular Movements: Extraocular movements intact.     Pupils: Pupils are equal, round, and reactive to light.  Neck:     Comments: No midline cervical tenderness.  The patient has diffuse right-sided cervical tenderness going into her right shoulder.  No swelling noted.  No overlying skin changes noted.  She has pain with range of motion.  No swelling or erythema noted.  No lymphadenopathy palpated. Cardiovascular:     Rate and Rhythm: Normal rate and regular rhythm.     Pulses:          Radial pulses are 2+ on the right side and 2+ on the left side.       Dorsalis pedis pulses are 2+ on the right side and 2+ on the left side.       Posterior tibial  pulses are 2+ on the right side and 2+ on the left side.     Heart sounds: Normal heart sounds.  Pulmonary:     Effort: Pulmonary effort is normal. No respiratory distress.     Breath sounds: Normal breath sounds.     Comments: Clear to auscultation bilaterally.  No respiratory distress, accessory muscle use, nasal flaring, cyanosis, or tripoding present.  Patient is speaking full sentences with ease and is satting well on room air without any increased work of breathing. Chest:     Chest wall: Tenderness present. No mass, crepitus or edema.     Comments: Diffuse chest wall tenderness to palpation.  No overlying skin changes noted.  No crepitus.  No step-offs or deformities. Abdominal:     General: Abdomen is flat. Bowel sounds are normal.     Palpations: Abdomen is soft.     Tenderness: There is no abdominal tenderness. There is no guarding or rebound.  Musculoskeletal:        General: No deformity.     Cervical back: Normal range of motion.     Right lower leg: No edema.     Left lower leg: No edema.  Skin:    General: Skin is warm and dry.  Neurological:     General: No  focal deficit present.     Mental Status: She is alert. Mental status is at baseline.     GCS: GCS eye subscore is 4. GCS verbal subscore is 5. GCS motor subscore is 6.     Cranial Nerves: No cranial nerve deficit, dysarthria or facial asymmetry.     Sensory: Sensory deficit present.     Motor: No weakness or pronator drift.     Coordination: Coordination normal. Finger-Nose-Finger Test normal.     Comments: GCS 15.  Patient's cranial nerves II through XII are intact.  No facial droop noted.  She is answering questions appropriate with appropriate speech.  No pronator drift.  She is equal strength in her upper and lower bilateral extremities.  Normal coordination.  Normal finger-nose.  However she does have some subjective decrease sensation to the right side of her face as well as her right upper extremity and right lower extremity.  Compartments are soft.  Palpable pulses.    ED Results / Procedures / Treatments   Labs (all labs ordered are listed, but only abnormal results are displayed) Labs Reviewed  CBC WITH DIFFERENTIAL/PLATELET - Abnormal; Notable for the following components:      Result Value   Platelets 502 (*)    All other components within normal limits  COMPREHENSIVE METABOLIC PANEL - Abnormal; Notable for the following components:   Glucose, Bld 137 (*)    Total Protein 8.3 (*)    All other components within normal limits  URINALYSIS, ROUTINE W REFLEX MICROSCOPIC  TROPONIN I (HIGH SENSITIVITY)  TROPONIN I (HIGH SENSITIVITY)    EKG None  Radiology No results found.  Procedures Procedures   Medications Ordered in ED Medications - No data to display  ED Course/ Medical Decision Making/ A&P                           Medical Decision Making Amount and/or Complexity of Data Reviewed Labs: ordered. Radiology: ordered.   56 year old female presents the emergency department for evaluation of chest pain as well as right-sided paresthesias from today.  Differential  diagnosis includes was not limited to TIA, stroke, radiculopathy, stress, costochondritis,  ACS, arrhythmia, dissection, pneumonia, pneumothorax.  Vital signs show mildly elevated blood pressure 125/97, otherwise afebrile, normal pulse rate, satting well room air without any increased work of breathing.  Physical exam is pertinent for no midline cervical tenderness.  The patient has diffuse right-sided cervical tenderness going into her right shoulder.  No swelling noted.  No overlying skin changes noted.  She has pain with range of motion.  No swelling or erythema noted.  No lymphadenopathy palpated. Clear to auscultation bilaterally.  No respiratory distress, accessory muscle use, nasal flaring, cyanosis, or tripoding present.  Patient is speaking full sentences with ease and is satting well on room air without any increased work of breathing. GCS 15.  Patient's cranial nerves II through XII are intact.  No facial droop noted.  She is answering questions appropriate with appropriate speech.  No pronator drift.  She is equal strength in her upper and lower bilateral extremities.  Normal coordination.  Normal finger-nose.  However she does have some subjective decrease sensation to the right side of her face as well as her right upper extremity and right lower extremity.  Compartments are soft.  Palpable pulses.   Because the patient's history, will order CT of head as well as chest pain work-up.  I independently reviewed the patient's labs.  CBC shows a thrombosed cytosis at 502 although patient did have an elevated platelet count with her lab work 5 months ago as well.  No leukocytosis or anemia.  His mildly other glucose at 137 although patient has known diabetes and this is not fasting.  She has a mildly elevated total protein 8.3, otherwise normal LFTs and electrolytes.  Her initial troponin was less than 2 with repeat of less than 2, delta approximately 0.  Her EKG is similar to previous.  CT of her  head shows no acute intercranial process.  Will order chest x-ray as well as a head MRI of her brain and cervical spine.  My attending assessed the patient at bedside and she is not concerned for any dissection.  She likely thinks that her neck and arm pain is from radiculopathy, however given her history, she suggested ordering the MRI of the brain and neck.  Given her negative troponins as well as unchanged EKG, less likely ACS.   On prior chart review, the patient was seen her on 12/9/ 22 and had similar presentation, although she had her parenthesis on the left.   Hand off to Sheri Blaze, PA-C to follow up on MRI imaging and CXR. If normal, the patient is safe for discharge with outpatient neurology follow up.   I discussed this case with my attending physician who cosigned this note including patient's presenting symptoms, physical exam, and planned diagnostics and interventions. Attending physician stated agreement with plan or made changes to plan which were implemented.   Attending physician assessed patient at bedside.  Final Clinical Impression(s) / ED Diagnoses Final diagnoses:  None    Rx / DC Orders ED Discharge Orders     None         Sherrell Puller, PA-C 08/29/21 0009    Isla Pence, MD 08/29/21 1506

## 2021-08-28 NOTE — ED Provider Notes (Incomplete)
[place] DEPT Provider Note   CSN: [id number] Arrival date & time: 08/28/21  1855     History Chief Complaint  Patient presents with  . Chest Pain    [name] is a 56 y.o. female h/o MI, stroke, HTN, DM, anemia presents to the ED for evaluation of chest pain as well as numbness tingling to the right side of her body.  The patient reports that she woke up at 0630 this morning with chest pressure that was constant and radiated towards her back.  Additionally, she mentions she had some nausea and diaphoresis as well.  Denies any vomiting or lightheadedness.  She reports she has shortness of breath at baseline however did have some worsening shortness of breath on exertion today.  Additionally later on the day she reported she had some right-sided neck pain that resulted in some right arm numbness and tingling.  She has pain with movement of the shoulder.  She reports that around 09306/1000 she started to experience a left head pain that resulted in right-sided facial numbness, right arm numbness, and right lower extremity numbness.  The patient reports that the night prior she had some swelling in her lower extremities but has been improving today.  She reports that she has been under a lot of stress that she is in taking care of her grandkids.  She denies any trouble with her speech or with her walking.  Denies any abdominal pain, fevers, chills, cough, blurry vision.   Chest Pain Associated symptoms: diaphoresis, headache, nausea, numbness and shortness of breath   Associated symptoms: no abdominal pain, no fever, no palpitations and no vomiting       Home Medications Prior to Admission medications   Medication Sig Start Date End Date Taking? Authorizing Provider  amLODipine (NORVASC) 10 MG tablet Take 1 tablet (10 mg total) by mouth daily. 07/28/21  Yes Park Liter, MD  aspirin 81 MG chewable tablet Chew 81 mg by mouth daily.   Yes [provider]  atorvastatin (LIPITOR) 10 MG tablet Take 10 mg by mouth at bedtime.   Yes [provider]  BREO ELLIPTA 200-25 MCG/INH AEPB Inhale 1 puff into the lungs daily. 10/30/20  Yes [provider]  calcium carbonate (OS-CAL - DOSED IN MG OF ELEMENTAL CALCIUM) 1250 (500 Ca) MG tablet Take 1 tablet by mouth daily with breakfast. Unknown strenght   Yes [provider]  carvedilol (COREG) 6.25 MG tablet Take 1 tablet (6.25 mg total) by mouth 2 (two) times daily with a meal. 02/14/19  Yes Dana Allan I, MD  cholecalciferol (VITAMIN D3) 25 MCG (1000 UNIT) tablet Take 1,000 Units by mouth daily.   Yes [provider]  cyclobenzaprine (FLEXERIL) 10 MG tablet Take 10 mg by mouth at bedtime as needed for muscle spasms. 09/28/20  Yes [provider]  desvenlafaxine (PRISTIQ) 100 MG 24 hr tablet Take 100 mg by mouth daily. 10/02/20  Yes [provider]  diazepam (VALIUM) 10 MG tablet Take 10 mg by mouth 4 (four) times daily.   Yes [provider]  EPINEPHrine 0.3 mg/0.3 mL IJ SOAJ injection Inject 0.3 mg into the muscle as needed for anaphylaxis. 06/21/20  Yes Breck Coons, MD  fluticasone [facility]) 50 MCG/ACT nasal spray Place 2 sprays into both nostrils daily as needed for allergies.    Yes [provider]  furosemide (LASIX) 20 MG tablet Take 20 mg by mouth daily. 09/04/20  Yes [provider]  gabapentin (NEURONTIN) 100 MG capsule Take 1 capsule by mouth in the morning and at bedtime. 05/01/21  Yes [provider]  Insulin Glargine (BASAGLAR KWIKPEN) 100 UNIT/ML Inject 30 Units into the skin daily. 11/07/20  Yes [provider]  montelukast (SINGULAIR) 10 MG tablet Take 10 mg by mouth daily. 10/06/17  Yes [provider]  nitroGLYCERIN (NITROSTAT) 0.3 MG SL tablet Place 0.3 mg under the tongue as needed for chest pain. 11/07/20  Yes [provider]  omeprazole (PRILOSEC) 20 MG capsule Take  20 mg by mouth daily.   Yes [provider]  OXYGEN Inhale 3 L into the lungs continuous.   Yes [provider]  polyethylene glycol (MIRALAX / GLYCOLAX) 17 g packet Take 17 g by mouth daily.   Yes [provider]  QUEtiapine (SEROQUEL) 200 MG tablet Take 200 mg by mouth at bedtime. 09/28/20  Yes [provider]  raloxifene (EVISTA) 60 MG tablet Take 60 mg by mouth daily. 02/14/19  Yes [provider]  ranolazine (RANEXA) 1000 MG SR tablet Take 1 tablet (1,000 mg total) by mouth 2 (two) times daily. 07/28/21  Yes Park Liter, MD  rOPINIRole (REQUIP) 4 MG tablet Take 4 mg by mouth at bedtime. 08/20/20  Yes [provider]  TRULICITY 3 KG/8.1EH SOPN Inject 3 mg into the skin once a week. 02/10/21   [provider]      Allergies    Banana, Cannabinoids, Dog epithelium, Grass pollen(k-o-r-t-swt vern), Latex, Lisinopril-hydrochlorothiazide, Onion, Penicillins, Tobacco, Tylenol [acetaminophen], American cockroach, Cat hair extract, Murine-derived products, Codeine, Hydrocodone, and Insect wax    Review of Systems   Review of Systems  Constitutional:  Positive for diaphoresis. Negative for chills and fever.  Respiratory:  Positive for shortness of breath.   Cardiovascular:  Positive for chest pain. Negative for palpitations.  Gastrointestinal:  Positive for nausea. Negative for abdominal pain, constipation, diarrhea and vomiting.  Genitourinary:  Negative for dysuria and hematuria.  Musculoskeletal:  Positive for arthralgias.  Neurological:  Positive for numbness and headaches. Negative for syncope, facial asymmetry and speech difficulty.   Physical Exam Updated Vital Signs BP 137/88   Pulse 84   Temp 97.6 F (36.4 C) (Oral)   Resp (!) 25   SpO2 100%  Physical Exam Vitals and nursing note reviewed.  Constitutional:      General: She is not in acute distress.    Appearance: Normal appearance. She is not ill-appearing,  toxic-appearing or diaphoretic.  HENT:     Head: Normocephalic and atraumatic.  Eyes:     General: No scleral icterus.    Extraocular Movements: Extraocular movements intact.     Pupils: Pupils are equal, round, and reactive to light.  Cardiovascular:     Rate and Rhythm: Normal rate and regular rhythm.     Pulses:          Radial pulses are 2+ on the right side and 2+ on the left side.       Dorsalis pedis pulses are 2+ on the right side and 2+ on the left side.       Posterior tibial pulses are 2+ on the right side and 2+ on the left side.     Heart sounds: Normal heart sounds.  Pulmonary:     Effort: Pulmonary effort is normal. No respiratory distress.     Breath sounds: Normal breath sounds.     Comments: Clear to auscultation bilaterally.  No respiratory distress, accessory muscle use,  nasal flaring, cyanosis, or tripoding present.  Patient is speaking full sentences with ease and is satting well on room air without any increased work of breathing. Chest:     Chest wall: Tenderness present. No crepitus or edema.     Comments: Diffuse chest wall tenderness to palpation. Abdominal:     General: Abdomen is flat. Bowel sounds are normal.     Palpations: Abdomen is soft.  Musculoskeletal:        General: No deformity.     Cervical back: Normal range of motion.  Skin:    General: Skin is warm and dry.  Neurological:     General: No focal deficit present.     Mental Status: She is alert. Mental status is at baseline.    ED Results / Procedures / Treatments   Labs (all labs ordered are listed, but only abnormal results are displayed) Labs Reviewed  CBC WITH DIFFERENTIAL/PLATELET - Abnormal; Notable for the following components:      Result Value   Platelets 502 (*)    All other components within normal limits  COMPREHENSIVE METABOLIC PANEL - Abnormal; Notable for the following components:   Glucose, Bld 137 (*)    Total Protein 8.3 (*)    All other components within normal  limits  URINALYSIS, ROUTINE W REFLEX MICROSCOPIC  TROPONIN I (HIGH SENSITIVITY)  TROPONIN I (HIGH SENSITIVITY)    EKG None  Radiology No results found.  Procedures Procedures   Medications Ordered in ED Medications - No data to display  ED Course/ Medical Decision Making/ A&P                           Medical Decision Making Amount and/or Complexity of Data Reviewed Labs: ordered. Radiology: ordered.   ***  56 year old female presents the emergency department for evaluation of chest pain as well as right-sided paresthesias from today.  Differential diagnosis includes was not limited to TIA, stroke, radiculopathy, stress, costochondritis, ACS, arrhythmia, dissection, pneumonia, pneumothorax.  Vital signs show mildly elevated blood pressure 125/97, otherwise afebrile, normal pulse rate, satting well room air without any increased work of breathing.  Physical exam is pertinent for***.  Because the patient's history, will order CT of head as well as chest pain work-up.  I independently reviewed the patient's labs.  CBC shows a thrombosed cytosis at 502 although patient did have an elevated platelet count with her lab work 5 months ago as well.  No leukocytosis or anemia.  His mildly other glucose at 137 although patient has known diabetes and this is not fasting.  She has a mildly elevated total protein 8.3, otherwise normal LFTs and electrolytes.  Her initial troponin was less than 2 with repeat of less than 2, delta approximately 0.  Her EKG is similar to previous.  CT of her head shows no acute intercranial process.  Will order chest x-ray as well as a head MRI of her brain and cervical spine.  My attending assessed the patient at bedside and she is not concerned for any dissection.  She likely thinks that her neck and arm pain is from radiculopathy, however given her history, she should just ordering the MRI of the brain and neck.  Hand off to Theodis Blaze, PA-C to follow up  on MRI imaging. If normal, the patient is safe for discharge with outpatient neurology follow up.   I discussed this case with my attending physician who cosigned this note including patient's  presenting symptoms, physical exam, and planned diagnostics and interventions. Attending physician stated agreement with plan or made changes to plan which were implemented.   Attending physician assessed patient at bedside.   Final Clinical Impression(s) / ED Diagnoses Final diagnoses:  None    Rx / DC Orders ED Discharge Orders     None

## 2021-08-29 ENCOUNTER — Emergency Department (HOSPITAL_COMMUNITY): Payer: Medicare HMO

## 2021-08-29 DIAGNOSIS — R0789 Other chest pain: Secondary | ICD-10-CM | POA: Diagnosis not present

## 2021-08-29 NOTE — Discharge Instructions (Addendum)
You are seen in the emergency department today for chest pain and tingling sensation.  Your work-up here has been reassuring.  Your heart function appears normal.  We also obtain MRI of your head and your cervical spine due to the tingling sensations that you have been having.  These are both normal.  Please follow-up with your cardiologist.  Please return to the emergency department for any worsening symptoms.

## 2021-09-04 ENCOUNTER — Ambulatory Visit (INDEPENDENT_AMBULATORY_CARE_PROVIDER_SITE_OTHER): Payer: Medicare HMO

## 2021-09-04 DIAGNOSIS — R0609 Other forms of dyspnea: Secondary | ICD-10-CM | POA: Diagnosis not present

## 2021-09-04 LAB — ECHOCARDIOGRAM COMPLETE
Area-P 1/2: 4.1 cm2
Calc EF: 55.9 %
S' Lateral: 3.2 cm
Single Plane A2C EF: 53.2 %
Single Plane A4C EF: 56.6 %

## 2021-09-11 ENCOUNTER — Telehealth: Payer: Self-pay | Admitting: Cardiology

## 2021-09-11 ENCOUNTER — Telehealth: Payer: Self-pay

## 2021-09-11 NOTE — Telephone Encounter (Signed)
Pt returning nurses call regarding results. Call transferred 

## 2021-09-11 NOTE — Telephone Encounter (Signed)
Results reviewed with pt as per Dr. Krasowski's note.  Pt verbalized understanding and had no additional questions. Routed to PCP  

## 2021-09-25 ENCOUNTER — Encounter: Payer: Self-pay | Admitting: Cardiology

## 2021-09-25 ENCOUNTER — Ambulatory Visit (INDEPENDENT_AMBULATORY_CARE_PROVIDER_SITE_OTHER): Payer: Medicare HMO | Admitting: Cardiology

## 2021-09-25 VITALS — BP 122/84 | HR 93 | Ht 59.5 in | Wt 213.0 lb

## 2021-09-25 DIAGNOSIS — I25118 Atherosclerotic heart disease of native coronary artery with other forms of angina pectoris: Secondary | ICD-10-CM

## 2021-09-25 DIAGNOSIS — I152 Hypertension secondary to endocrine disorders: Secondary | ICD-10-CM

## 2021-09-25 DIAGNOSIS — I5032 Chronic diastolic (congestive) heart failure: Secondary | ICD-10-CM | POA: Diagnosis not present

## 2021-09-25 DIAGNOSIS — R0609 Other forms of dyspnea: Secondary | ICD-10-CM | POA: Diagnosis not present

## 2021-09-25 DIAGNOSIS — E1159 Type 2 diabetes mellitus with other circulatory complications: Secondary | ICD-10-CM | POA: Diagnosis not present

## 2021-09-25 MED ORDER — CARVEDILOL 12.5 MG PO TABS: 12.5000 mg | ORAL_TABLET | Freq: Two times a day (BID) | ORAL | 3 refills | Status: DC

## 2021-09-25 NOTE — Patient Instructions (Signed)
Medication Instructions:  Your physician has recommended you make the following change in your medication: Increase Carvedilol to 12.'5mg'$  twice daily by mouth. You may double the dose of your current medication and then your next refill will be 12.'5mg'$  tablets - 1 tablet twice daily.   Lab Work: None Ordered If you have labs (blood work) drawn today and your tests are completely normal, you will receive your results only by: [place] (if you have MyChart) OR A paper copy in the mail If you have any lab test that is abnormal or we need to change your treatment, we will call you to review the results.   Testing/Procedures: None Ordered   Follow-Up: At [facility], you and your health needs are our priority.  As part of our continuing mission to provide you with exceptional heart care, we have created designated Provider Care Teams.  These Care Teams include your primary Cardiologist (physician) and Advanced Practice Providers (APPs -  Physician Assistants and Nurse Practitioners) who all work together to provide you with the care you need, when you need it.  We recommend signing up for the patient portal called "MyChart".  Sign up information is provided on this After Visit Summary.  MyChart is used to connect with patients for Virtual Visits (Telemedicine).  Patients are able to view lab/test results, encounter notes, upcoming appointments, etc.  Non-urgent messages can be sent to your provider as well.   To learn more about what you can do with MyChart, go to [email or web address].    Your next appointment:   5 month(s)  The format for your next appointment:   In Person  Provider:   Jenne Campus, MD    Other Instructions NA

## 2021-09-25 NOTE — Progress Notes (Signed)
Cardiology Office Note:    Date:  09/25/2021   ID:  [name], DOB 1965/11/25, MRN [id number]  PCP:  Bernerd Limbo, MD  Cardiologist:  Jenne Campus, MD    Referring MD: Bernerd Limbo, MD   Chief Complaint  Patient presents with   Results    History of Present Illness:    [name] is a 56 y.o. female   I met her first time last year when she was in the hospital because of atypical chest pain, eventually end up having minimally elevated troponin she was sent to [facility] for cardiac catheterization cardiac catheterization showed only 20% lesion of proximal LAD conclusion at that time was that spell of troponin was related probably to vasospasm she may have occluded very small branch.  She was put on appropriate medications and discharged home. Last time I seen her she was complaining of 5 some shortness of breath as well as swelling of lower extremities, proBNP has been done which was normal echocardiogram has been performed which showed normal left ventricle ejection fraction, grade 1 diastolic dysfunction she was given small dose of diuretic 20 mg of Lasix daily with good response.  She also and up having another episode of chest pain that brought her to the emergency room I did review a that visit from the emergency room biochemical markers were negative, EKG did not show any changes she was discharged home.  Since that time she seems to be doing fine.  Described to have rare episode of chest pain sometimes he take nitroglycerin but rarely typically she gets headache after that.  Past Medical History:  Diagnosis Date   Abnormal mammogram of left breast 01/20/2016   Abnormal stress test    Anemia, iron deficiency 02/06/2013   Anxiety    Anxiety state 10/31/2008   Arthritis    osteoarthritis-knees   Asthma    Breast fibroadenoma, left    Breast mass, left 06/29/2017   Chest pain 02/09/2019   Depression    Diabetes mellitus without complication ([place])    Disease of eye  characterized by increased eye pressure    no eye drops used-some increased pressure   DOE (dyspnea on exertion) 11/22/2017   Elevated glucose 03/02/2016   Fibromyalgia 12/17/2015   Genetic testing 07/12/2017   Formatting of this note might be different from the original. Negative Breast Cancer STAT panel (9 genes) through [place]:  Formatting of this note might be different from the original.   GERD (gastroesophageal reflux disease)    Headache(784.0)    occ. with allergies/sinus issues   History of colon polyps 04/2016   History of MRSA infection 2007   History of vitamin D deficiency 03/02/2016   Hypertension    no problems since weight loss -'09   Insomnia 02/06/2013   Multiple food allergies    Latex allergy-respiratory   Obesity    Pain in left knee 12/31/2017   Personal history of tobacco use, presenting hazards to health 06/27/2013   Restless leg syndrome 05/15/2019   Right upper quadrant abdominal pain 07/15/2016   S/P left TKA 01/31/2013   Screening for cervical cancer 12/19/2016   Formatting of this note might be different from the original. Status post hysterectomy   Skin sensation disturbance 12/03/2012   Formatting of this note might be different from the original. STORY: She presented to the emergency room with left arm discomfort paresthesia hypertension and a history of some weakness of her face.  CT  scan of her head was negative.  Labs were normal. Formatting of this note might be different from the original. Overview:  Overview:  STORY: She presented to the emergency room with left arm discom   Syncope 02/09/2019   Tendonitis    Toxic effect of venom 12/16/2011   Formatting of this note might be different from the original. STORY: Dr Joesph July: always carry epi pen.   Vitamin D deficiency    history of    Past Surgical History:  Procedure Laterality Date   ABDOMINAL HYSTERECTOMY     BOWEL RESECTION     CESAREAN SECTION     x4    COLONOSCOPY W/ POLYPECTOMY  04/2016   KNEE SURGERY     Right knee scope 8'12   LEFT HEART CATH AND CORONARY ANGIOGRAPHY N/A 02/13/2019   Procedure: LEFT HEART CATH AND CORONARY ANGIOGRAPHY;  Surgeon: Nelva Bush, MD;  Location: [facility];  Service: Cardiovascular;  Laterality: N/A;   LEFT HEART CATH AND CORONARY ANGIOGRAPHY N/A 10/08/2020   Procedure: LEFT HEART CATH AND CORONARY ANGIOGRAPHY;  Surgeon: Martinique, Peter M, MD;  Location: [facility];  Service: Cardiovascular;  Laterality: N/A;   TOTAL KNEE ARTHROPLASTY Right 01/31/2013   Procedure: RIGHT TOTAL KNEE ARTHROPLASTY;  Surgeon: [name], MD;  Location: [facility];  Service: Orthopedics;  Laterality: Right;   TOTAL KNEE ARTHROPLASTY Left 02/15/2018   Procedure: LEFT TOTAL KNEE ARTHROPLASTY;  Surgeon: Paralee Cancel, MD;  Location: [facility];  Service: Orthopedics;  Laterality: Left;  68    Current Medications: Current Meds  Medication Sig   amLODipine (NORVASC) 10 MG tablet Take 1 tablet (10 mg total) by mouth daily.   aspirin 81 MG chewable tablet Chew 81 mg by mouth daily.   atorvastatin (LIPITOR) 10 MG tablet Take 10 mg by mouth at bedtime.   BREO ELLIPTA 200-25 MCG/INH AEPB Inhale 1 puff into the lungs daily.   calcium carbonate (OS-CAL - DOSED IN MG OF ELEMENTAL CALCIUM) 1250 (500 Ca) MG tablet Take 1 tablet by mouth daily with breakfast. Unknown strenght   carvedilol (COREG) 6.25 MG tablet Take 1 tablet (6.25 mg total) by mouth 2 (two) times daily with a meal.   cholecalciferol (VITAMIN D3) 25 MCG (1000 UNIT) tablet Take 1,000 Units by mouth daily.   cyclobenzaprine (FLEXERIL) 10 MG tablet Take 10 mg by mouth at bedtime as needed for muscle spasms.   desvenlafaxine (PRISTIQ) 100 MG 24 hr tablet Take 100 mg by mouth daily.   diazepam (VALIUM) 10 MG tablet Take 10 mg by mouth 4 (four) times daily.   EPINEPHrine 0.3 mg/0.3 mL IJ SOAJ injection Inject 0.3 mg into the muscle as needed for anaphylaxis.   fluticasone  (FLONASE) 50 MCG/ACT nasal spray Place 2 sprays into both nostrils daily as needed for allergies.    furosemide (LASIX) 20 MG tablet Take 20 mg by mouth daily.   gabapentin (NEURONTIN) 100 MG capsule Take 1 capsule by mouth in the morning and at bedtime.   Insulin Glargine (BASAGLAR KWIKPEN) 100 UNIT/ML Inject 30 Units into the skin daily.   montelukast (SINGULAIR) 10 MG tablet Take 10 mg by mouth daily.   nitroGLYCERIN (NITROSTAT) 0.3 MG SL tablet Place 0.3 mg under the tongue as needed for chest pain.   omeprazole (PRILOSEC) 20 MG capsule Take 20 mg by mouth daily.   OXYGEN Inhale 3 L into the lungs continuous.   polyethylene glycol (MIRALAX / GLYCOLAX) 17 g packet Take 17 g  by mouth daily.   QUEtiapine (SEROQUEL) 200 MG tablet Take 200 mg by mouth at bedtime.   raloxifene (EVISTA) 60 MG tablet Take 60 mg by mouth daily.   ranolazine (RANEXA) 1000 MG SR tablet Take 1 tablet (1,000 mg total) by mouth 2 (two) times daily.   rOPINIRole (REQUIP) 4 MG tablet Take 4 mg by mouth at bedtime.   TRULICITY 3 ZG/0.1VC SOPN Inject 3 mg into the skin once a week.     Allergies:   Banana, Cannabinoids, Dog epithelium, Grass pollen(k-o-r-t-swt vern), Latex, Lisinopril-hydrochlorothiazide, Onion, Penicillins, Tobacco, Tylenol [acetaminophen], American cockroach, Cat hair extract, Murine-derived products, Codeine, Hydrocodone, and Insect wax   Social History   Socioeconomic History   Marital status: Married    Spouse name: antonio   Number of children: 6   Years of education: college   Highest education level: Not on file  Occupational History   Occupation: N/A  Tobacco Use   Smoking status: Former    Types: Cigarettes    Quit date: 01/25/2005    Years since quitting: 16.6   Smokeless tobacco: Never  Vaping Use   Vaping Use: Never used  Substance and Sexual Activity   Alcohol use: No   Drug use: No   Sexual activity: Yes    Partners: Male  Other Topics Concern   Not on file  Social History  Narrative   Not on file   Social Determinants of Health   Financial Resource Strain: Not on file  Food Insecurity: Not on file  Transportation Needs: Not on file  Physical Activity: Not on file  Stress: Not on file  Social Connections: Not on file     Family History: The patient's family history includes Diabetes in her father; Lung cancer in her mother. There is no history of Colon cancer. ROS:   Please see the history of present illness.    All 14 point review of systems negative except as described per history of present illness  EKGs/Labs/Other Studies Reviewed:      Recent Labs: 10/06/2020: B Natriuretic Peptide 6.2 08/14/2021: NT-Pro BNP <36 08/28/2021: ALT 16; BUN 8; Creatinine, Ser 0.74; Hemoglobin 13.7; Platelets 502; Potassium 3.5; Sodium 141  Recent Lipid Panel    Component Value Date/Time   CHOL 117 10/07/2020 0153   TRIG 69 10/07/2020 0153   HDL 42 10/07/2020 0153   CHOLHDL 2.8 10/07/2020 0153   VLDL 14 10/07/2020 0153   LDLCALC 61 10/07/2020 0153    Physical Exam:    VS:  BP 122/84 (BP Location: Left Arm, Patient Position: Sitting)   Pulse 93   Ht 4' 11.5" (1.511 m)   Wt 213 lb (96.6 kg)   SpO2 97%   BMI 42.30 kg/m     Wt Readings from Last 3 Encounters:  09/25/21 213 lb (96.6 kg)  08/14/21 210 lb (95.3 kg)  02/25/21 212 lb 9.6 oz (96.4 kg)     GEN:  Well nourished, well developed in no acute distress HEENT: Normal NECK: No JVD; No carotid bruits LYMPHATICS: No lymphadenopathy CARDIAC: RRR, no murmurs, no rubs, no gallops RESPIRATORY:  Clear to auscultation without rales, wheezing or rhonchi  ABDOMEN: Soft, non-tender, non-distended MUSCULOSKELETAL:  No edema; No deformity  SKIN: Warm and dry LOWER EXTREMITIES: no swelling NEUROLOGIC:  Alert and oriented x 3 PSYCHIATRIC:  Normal affect   ASSESSMENT:    1. Coronary artery disease of native artery of native heart with stable angina pectoris ([place])   2. Hypertension associated with diabetes  (  [place])   3. Chronic diastolic congestive heart failure ([place])   4. DOE (dyspnea on exertion)    PLAN:    In order of problems listed above:  Coronary artery disease only 20% ostial LAD based on last cardiac catheterization.  I suspect microvascular circulation problem or vasospastic component.  We will do EKG today if EKG is fine I will double the dose of carvedilol.  Her resting heart rate is 93 so we have room for that also blood pressure is good. Dyslipidemia I did review K PN which show me LDL of 61 HDL 42 we will continue present management. Diastolic congestive heart failure seems to be compensated on the physical exam.  We will continue present management. Dyspnea exertion: I encouraged her to be a little more active.   Medication Adjustments/Labs and Tests Ordered: Current medicines are reviewed at length with the patient today.  Concerns regarding medicines are outlined above.  No orders of the defined types were placed in this encounter.  Medication changes: No orders of the defined types were placed in this encounter.   Signed, Park Liter, MD, [facility] 09/25/2021 1:08 PM    [place]

## 2021-09-25 NOTE — Addendum Note (Signed)
Addended by: Jacobo Forest D on: 09/25/2021 01:14 PM   Modules accepted: Orders

## 2021-11-27 ENCOUNTER — Telehealth: Payer: Self-pay | Admitting: Cardiology

## 2021-11-27 NOTE — Telephone Encounter (Signed)
Recommendations reviewed with pt as per Dr. Revankar's note.  Pt verbalized understanding and had no additional questions.   

## 2021-11-27 NOTE — Telephone Encounter (Signed)
Pt c/o swelling: STAT is pt has developed SOB within 24 hours  How much weight have you gained and in what time span? 12 lbs since 07/04  If swelling, where is the swelling located? All over   Are you currently taking a fluid pill? Yes  Are you currently SOB? Yes  Do you have a log of your daily weights (if so, list)? No  Have you gained 3 pounds in a day or 5 pounds in a week? 5 lbs in week  Have you traveled recently? Yes. Traveled to [place] for family reunion. In [place] currently.   Pt states that she has swelled so much she has had to have rings cut off. Requested higher dose of lasix.

## 2021-11-27 NOTE — Telephone Encounter (Signed)
Spoke with pt who states her BP is 185/100 and 195/110. Pt denies chest pain but does have shortness of breath. Pt is currently on 20 mg Lasix daily. Please advise.

## 2021-11-28 ENCOUNTER — Telehealth: Payer: Self-pay | Admitting: Cardiology

## 2021-11-28 NOTE — Telephone Encounter (Signed)
Advised that she does need to go to the ED for evaluation as pt states she is feeling worse today.

## 2021-11-28 NOTE — Telephone Encounter (Signed)
Follow Up:     Patient said she had talk to [facility] yesterday. She is now coming back in town. Her question is , should she go ahead to [facility] ER?Marland Kitchen

## 2021-11-28 NOTE — Telephone Encounter (Signed)
    I do need to add to the previous note, that thepatient did say she is still having problems

## 2022-02-08 ENCOUNTER — Other Ambulatory Visit: Payer: Self-pay | Admitting: Cardiology

## 2022-02-09 NOTE — Telephone Encounter (Signed)
Rx refill sent to pharmacy. 

## 2022-02-25 ENCOUNTER — Ambulatory Visit: Payer: Medicare HMO | Attending: Cardiology | Admitting: Cardiology

## 2022-03-05 ENCOUNTER — Encounter: Payer: Self-pay | Admitting: Cardiology

## 2022-08-11 ENCOUNTER — Other Ambulatory Visit: Payer: Self-pay | Admitting: Cardiology

## 2022-09-17 ENCOUNTER — Other Ambulatory Visit: Payer: Self-pay

## 2022-09-17 ENCOUNTER — Emergency Department (HOSPITAL_COMMUNITY): Payer: Medicare HMO

## 2022-09-17 ENCOUNTER — Emergency Department (HOSPITAL_COMMUNITY)
Admission: EM | Admit: 2022-09-17 | Discharge: 2022-09-17 | Disposition: A | Discharge status: Home / Self Care | Payer: Medicare HMO | Attending: Emergency Medicine | Admitting: Emergency Medicine

## 2022-09-17 ENCOUNTER — Encounter (HOSPITAL_COMMUNITY): Payer: Self-pay

## 2022-09-17 DIAGNOSIS — R109 Unspecified abdominal pain: Secondary | ICD-10-CM | POA: Diagnosis not present

## 2022-09-17 DIAGNOSIS — Z91040 Latex allergy status: Secondary | ICD-10-CM | POA: Diagnosis not present

## 2022-09-17 DIAGNOSIS — E119 Type 2 diabetes mellitus without complications: Secondary | ICD-10-CM | POA: Diagnosis not present

## 2022-09-17 DIAGNOSIS — R519 Headache, unspecified: Secondary | ICD-10-CM | POA: Insufficient documentation

## 2022-09-17 DIAGNOSIS — J45909 Unspecified asthma, uncomplicated: Secondary | ICD-10-CM | POA: Insufficient documentation

## 2022-09-17 DIAGNOSIS — Z7951 Long term (current) use of inhaled steroids: Secondary | ICD-10-CM | POA: Insufficient documentation

## 2022-09-17 DIAGNOSIS — R000 Tachycardia, unspecified: Secondary | ICD-10-CM | POA: Insufficient documentation

## 2022-09-17 DIAGNOSIS — R079 Chest pain, unspecified: Secondary | ICD-10-CM | POA: Diagnosis present

## 2022-09-17 DIAGNOSIS — Z7982 Long term (current) use of aspirin: Secondary | ICD-10-CM | POA: Diagnosis not present

## 2022-09-17 DIAGNOSIS — Z794 Long term (current) use of insulin: Secondary | ICD-10-CM | POA: Diagnosis not present

## 2022-09-17 LAB — CBC
HCT: 42.6 % (ref 36.0–46.0)
Hemoglobin: 13.3 g/dL (ref 12.0–15.0)
MCH: 28.7 pg (ref 26.0–34.0)
MCHC: 31.2 g/dL (ref 30.0–36.0)
MCV: 92 fL (ref 80.0–100.0)
Platelets: 410 10*3/uL — ABNORMAL HIGH (ref 150–400)
RBC: 4.63 MIL/uL (ref 3.87–5.11)
RDW: 14.9 % (ref 11.5–15.5)
WBC: 7 10*3/uL (ref 4.0–10.5)
nRBC: 0 % (ref 0.0–0.2)

## 2022-09-17 LAB — BASIC METABOLIC PANEL
Anion gap: 11 (ref 5–15)
BUN: 9 mg/dL (ref 6–20)
CO2: 24 mmol/L (ref 22–32)
Calcium: 8.6 mg/dL — ABNORMAL LOW (ref 8.9–10.3)
Chloride: 106 mmol/L (ref 98–111)
Creatinine, Ser: 0.79 mg/dL (ref 0.44–1.00)
GFR, Estimated: >60 mL/min (ref >60)
Glucose, Bld: 82 mg/dL (ref 70–99)
Potassium: 4.7 mmol/L (ref 3.5–5.1)
Sodium: 141 mmol/L (ref 135–145)

## 2022-09-17 LAB — TROPONIN I (HIGH SENSITIVITY)
Troponin I (High Sensitivity): 5 ng/L (ref <18)
Troponin I (High Sensitivity): 4 ng/L (ref <18)

## 2022-09-17 LAB — D-DIMER, QUANTITATIVE: D-Dimer, Quant: <0.27 ug/mL-FEU (ref 0.00–0.50)

## 2022-09-17 LAB — CBG MONITORING, ED
Glucose-Capillary: 114 mg/dL — ABNORMAL HIGH (ref 70–99)
Glucose-Capillary: 86 mg/dL (ref 70–99)

## 2022-09-17 MED ORDER — HYDROMORPHONE HCL 1 MG/ML IJ SOLN
0.5000 mg | Freq: Once | INTRAMUSCULAR | Status: AC
  Administered 2022-09-17: 0.5 mg via INTRAVENOUS
  Filled 2022-09-17: qty 1

## 2022-09-17 NOTE — ED Provider Notes (Signed)
[place] EMERGENCY DEPARTMENT AT [facility] Provider Note   CSN: [id number] Arrival date & time: 09/17/22  1425     History  Chief Complaint  Patient presents with   Chest Pain    [name] is a 57 y.o. female.  Patient is a 57 year old female with a history of asthma, GERD, diabetes, anemia, dyspnea on exertion who is presenting today with complaints of chest pain and shortness of breath.  Patient reports for the last 2 weeks she has not been herself she has felt like she has been swollen everywhere but in the last week has been having pain in her upper chest.  It seems to be mostly in the middle is worse sometimes with deep breathing moving around.  She has had a cough that is been nonproductive and denies any known fevers.  She also reports that she has had some mild abdominal discomfort but has had no diarrhea or vomiting.  She has had no change in her medications and reports with elevation it seems like her legs are getting better but it was just not getting better fast enough and her family was worried.  She does not take any anticoagulation reports a strong family history of clots on both sides but she has never had a blood clot.  She denies known history of heart disease.  Today she did report that her blood sugar was low but it got better with the 8 and she does not usually have issues with low blood sugar.  She does not think her diet has significantly changed recently.  No known sick contacts.  The history is provided by the patient.  Chest Pain      Home Medications Prior to Admission medications   Medication Sig Start Date End Date Taking? Authorizing Provider  albuterol (VENTOLIN HFA) 108 (90 Base) MCG/ACT inhaler Inhale 2 puffs into the lungs every 6 (six) hours as needed for wheezing or shortness of breath.   Yes [provider]  amLODipine (NORVASC) 10 MG tablet Take 1 tablet (10 mg total) by mouth daily. 08/11/22  Yes Georgeanna Lea, MD   aspirin 81 MG chewable tablet Chew 81 mg by mouth daily.   Yes [provider]  atorvastatin (LIPITOR) 10 MG tablet Take 10 mg by mouth at bedtime.   Yes [provider]  BREO ELLIPTA 200-25 MCG/INH AEPB Inhale 1 puff into the lungs daily as needed (wheezing). 10/30/20  Yes [provider]  calcium carbonate (OS-CAL - DOSED IN MG OF ELEMENTAL CALCIUM) 1250 (500 Ca) MG tablet Take 1 tablet by mouth daily with breakfast. Unknown strenght   Yes [provider]  carvedilol (COREG) 12.5 MG tablet Take 1 tablet (12.5 mg total) by mouth 2 (two) times daily. Patient taking differently: Take 6.25 mg by mouth 2 (two) times daily. 09/25/21  Yes Georgeanna Lea, MD  cholecalciferol (VITAMIN D3) 25 MCG (1000 UNIT) tablet Take 1,000 Units by mouth daily.   Yes [provider]  cyclobenzaprine (FLEXERIL) 10 MG tablet Take 10 mg by mouth at bedtime. 09/28/20  Yes [provider]  diazepam (VALIUM) 10 MG tablet Take 10 mg by mouth 4 (four) times daily.   Yes [provider]  EPINEPHrine 0.3 mg/0.3 mL IJ SOAJ injection Inject 0.3 mg into the muscle as needed for anaphylaxis. Patient taking differently: Inject 0.3 mg into the muscle once as needed for anaphylaxis. 06/21/20  Yes Sabino Donovan, MD  fluticasone [facility]) 50 MCG/ACT nasal spray  Place 2 sprays into both nostrils daily as needed for allergies.    Yes [provider]  hydrOXYzine (ATARAX) 50 MG tablet Take 50 mg by mouth daily. 09/03/22  Yes [provider]  Insulin Glargine (BASAGLAR KWIKPEN) 100 UNIT/ML Inject 30 Units into the skin daily. 11/07/20  Yes [provider]  montelukast (SINGULAIR) 10 MG tablet Take 10 mg by mouth daily. 10/06/17  Yes [provider]  nitroGLYCERIN (NITROSTAT) 0.3 MG SL tablet Place 0.3 mg under the tongue every 5 (five) minutes as needed for chest pain. 11/07/20  Yes [provider]  omeprazole (PRILOSEC) 20 MG capsule Take 20  mg by mouth daily.   Yes [provider]  polyethylene glycol (MIRALAX / GLYCOLAX) 17 g packet Take 17 g by mouth daily.   Yes [provider]  QUEtiapine (SEROQUEL) 200 MG tablet Take 200 mg by mouth at bedtime. 09/28/20  Yes [provider]  raloxifene (EVISTA) 60 MG tablet Take 60 mg by mouth daily. 02/14/19  Yes [provider]  raNITIdine HCl (RANITIDINE 150 MAX STRENGTH PO) Take 150 mg by mouth daily as needed (allergy).   Yes [provider]  ranolazine (RANEXA) 1000 MG SR tablet Take 1 tablet (1,000 mg total) by mouth 2 (two) times daily. 08/11/22  Yes Georgeanna Lea, MD  rOPINIRole (REQUIP) 4 MG tablet Take 4 mg by mouth at bedtime. 08/20/20  Yes [provider]  TRULICITY 3 MG/0.5ML SOPN Inject 3 mg into the skin once a week. Tuesdays 02/10/21  Yes [provider]  VRAYLAR 3 MG capsule Take 3 mg by mouth every morning. 08/21/22  Yes [provider]      Allergies    Banana, Cannabinoids, Dog epithelium, Grass pollen(k-o-r-t-swt vern), Latex, Lisinopril-hydrochlorothiazide, Onion, Penicillins, Tobacco, Tylenol [acetaminophen], American cockroach, Cat hair extract, Murine-derived products, Codeine, Hydrocodone, Insect wax, and Other    Review of Systems   Review of Systems  Cardiovascular:  Positive for chest pain.    Physical Exam Updated Vital Signs BP (!) 150/106   Pulse 91   Temp 97.9 F (36.6 C) (Oral)   Resp 20   Ht 4' 11.5" (1.511 m)   Wt 96.6 kg   SpO2 91%   BMI 42.29 kg/m  Physical Exam Vitals and nursing note reviewed.  Constitutional:      General: She is not in acute distress.    Appearance: She is well-developed.  HENT:     Head: Normocephalic and atraumatic.  Eyes:     Pupils: Pupils are equal, round, and reactive to light.  Cardiovascular:     Rate and Rhythm: Regular rhythm. Tachycardia present.     Heart sounds: Normal heart sounds. No murmur heard.    No friction rub.   Pulmonary:     Effort: Pulmonary effort is normal.     Breath sounds: Normal breath sounds. No wheezing or rales.  Abdominal:     General: Bowel sounds are normal. There is distension.     Palpations: Abdomen is soft.     Tenderness: There is abdominal tenderness. There is no guarding or rebound.     Comments: Mild diffuse tenderness  Musculoskeletal:        General: No tenderness. Normal range of motion.     Right lower leg: No edema.     Left lower leg: No edema.     Comments: No edema  Skin:    General: Skin is warm and dry.     Findings:  No rash.  Neurological:     Mental Status: She is alert and oriented to person, place, and time.     Cranial Nerves: No cranial nerve deficit.  Psychiatric:        Behavior: Behavior normal.     ED Results / Procedures / Treatments   Labs (all labs ordered are listed, but only abnormal results are displayed) Labs Reviewed  BASIC METABOLIC PANEL - Abnormal; Notable for the following components:      Result Value   Calcium 8.6 (*)    All other components within normal limits  CBC - Abnormal; Notable for the following components:   Platelets 410 (*)    All other components within normal limits  CBG MONITORING, ED - Abnormal; Notable for the following components:   Glucose-Capillary 114 (*)    All other components within normal limits  D-DIMER, QUANTITATIVE  CBG MONITORING, ED  TROPONIN I (HIGH SENSITIVITY)  TROPONIN I (HIGH SENSITIVITY)    EKG None  Radiology DG Chest 2 View  Result Date: 09/17/2022 CLINICAL DATA:  Chest pain. EXAM: CHEST - 2 VIEW COMPARISON:  08/29/2021 no interval change. FINDINGS: Underinflation. No consolidation, pneumothorax or effusion. Normal cardiopericardial silhouette without edema when adjusting for level of inflation. Prominent degenerative changes of the spine on the lateral view. IMPRESSION: Underinflation.  No acute cardiopulmonary disease. Electronically Signed   By: Karen Kays M.D.   On:  09/17/2022 15:27    Procedures Procedures    Medications Ordered in ED Medications  HYDROmorphone (DILAUDID) injection 0.5 mg (0.5 mg Intravenous Given 09/17/22 1920)    ED Course/ Medical Decision Making/ A&P                             Medical Decision Making Amount and/or Complexity of Data Reviewed External Data Reviewed: notes. Labs: ordered. Decision-making details documented in ED Course. Radiology: ordered and independent interpretation performed. Decision-making details documented in ED Course. ECG/medicine tests: ordered and independent interpretation performed. Decision-making details documented in ED Course.  Risk Prescription drug management.   Pt with multiple medical problems and comorbidities and presenting today with a complaint that caries a high risk for morbidity and mortality.  Today with multiple complaints but concern for possible PE as patient is tachycardic and O2 sats are in the low 90s versus asthma exacerbation versus CHF versus ACS.  Lower suspicion for pneumonia, acute GI pathology.  Patient has no complaints of urinary symptoms and low suspicion for urinary issues.  Patient's repeat blood sugar here is normal.  She is not having specific infectious symptoms.  I dependently interpreted patient's EKG and labs.  EKG does show sinus tachycardia but no other acute changes.  Troponin x 2, BMP, CBC are all within normal limits.  Low suspicion for CHF at this time as there is no appreciable swelling in her lower extremities.  Given her family history and tachycardia D-dimer was sent. I have independently visualized and interpreted pt's images today.  CXR without acute findings. D-dimer is negative.  At this time low suspicion for PE.  Findings discussed with the patient.  At this time do not feel that patient needs further imaging of her chest.  Unclear the cause of patient's symptoms.  Again may be URI, flare in her asthma but at this time she appears stable for  discharge and does not require any further testing.          Final Clinical Impression(s) /  ED Diagnoses Final diagnoses:  Nonspecific chest pain  Intractable headache, unspecified chronicity pattern, unspecified headache type    Rx / DC Orders ED Discharge Orders     None         Gwyneth Sprout, MD 09/17/22 2035

## 2022-09-17 NOTE — Discharge Instructions (Signed)
Signs of blood clots today or fluid in your lung.  No signs of pneumonia.  At this time it is safe for you to go home but you should follow-up with your doctor if your symptoms do not resolve.  Continue to use your inhalers like you have been doing.

## 2022-09-17 NOTE — ED Triage Notes (Signed)
Pt c/o non radiating midsternal chest pain and swelling of bodyx2wks. Pt c/o SOB w/exertionx78mo. Pt is eupneic.

## 2022-09-21 ENCOUNTER — Other Ambulatory Visit: Payer: Self-pay | Admitting: Cardiology

## 2022-10-11 ENCOUNTER — Other Ambulatory Visit: Payer: Self-pay | Admitting: Cardiology

## 2022-10-21 ENCOUNTER — Other Ambulatory Visit: Payer: Self-pay

## 2022-10-21 MED ORDER — RANOLAZINE ER 1000 MG PO TB12: 1000.0000 mg | ORAL_TABLET | Freq: Two times a day (BID) | ORAL | 0 refills | Status: DC

## 2022-10-21 MED ORDER — CARVEDILOL 12.5 MG PO TABS: 12.5000 mg | ORAL_TABLET | Freq: Two times a day (BID) | ORAL | 0 refills | Status: DC

## 2022-10-21 MED ORDER — AMLODIPINE BESYLATE 10 MG PO TABS: 10.0000 mg | ORAL_TABLET | Freq: Every day | ORAL | 0 refills | Status: DC

## 2022-11-23 ENCOUNTER — Other Ambulatory Visit: Payer: Self-pay

## 2022-11-23 MED ORDER — CARVEDILOL 12.5 MG PO TABS: 12.5000 mg | ORAL_TABLET | Freq: Two times a day (BID) | ORAL | 0 refills | Status: DC

## 2022-11-23 MED ORDER — RANOLAZINE ER 1000 MG PO TB12: 1000.0000 mg | ORAL_TABLET | Freq: Two times a day (BID) | ORAL | 0 refills | Status: DC

## 2022-11-23 MED ORDER — AMLODIPINE BESYLATE 10 MG PO TABS: 10.0000 mg | ORAL_TABLET | Freq: Every day | ORAL | 0 refills | Status: DC

## 2022-12-24 ENCOUNTER — Other Ambulatory Visit: Payer: Self-pay

## 2022-12-24 MED ORDER — AMLODIPINE BESYLATE 10 MG PO TABS: 10.0000 mg | ORAL_TABLET | Freq: Every day | ORAL | 0 refills | Status: AC

## 2022-12-24 MED ORDER — CARVEDILOL 12.5 MG PO TABS: 12.5000 mg | ORAL_TABLET | Freq: Two times a day (BID) | ORAL | 0 refills | Status: AC

## 2022-12-24 MED ORDER — RANOLAZINE ER 1000 MG PO TB12: 1000.0000 mg | ORAL_TABLET | Freq: Two times a day (BID) | ORAL | 0 refills | Status: AC

## 2023-01-21 ENCOUNTER — Other Ambulatory Visit: Payer: Self-pay

## 2023-01-27 ENCOUNTER — Other Ambulatory Visit: Payer: Self-pay | Admitting: Cardiology

## 2023-03-16 ENCOUNTER — Encounter: Payer: Self-pay | Admitting: Family Medicine

## 2023-04-21 ENCOUNTER — Encounter: Payer: Self-pay | Admitting: Gastroenterology

## 2023-07-09 ENCOUNTER — Encounter: Payer: Self-pay | Admitting: Nurse Practitioner

## 2023-11-06 ENCOUNTER — Inpatient Hospital Stay (HOSPITAL_COMMUNITY)
Admission: EM | Admit: 2023-11-06 | Discharge: 2023-11-08 | DRG: 313 | Disposition: A | Discharge status: Home / Self Care | Attending: Family Medicine | Admitting: Family Medicine

## 2023-11-06 ENCOUNTER — Encounter (HOSPITAL_COMMUNITY): Payer: Self-pay

## 2023-11-06 ENCOUNTER — Other Ambulatory Visit: Payer: Self-pay

## 2023-11-06 ENCOUNTER — Emergency Department (HOSPITAL_COMMUNITY)

## 2023-11-06 DIAGNOSIS — T463X5A Adverse effect of coronary vasodilators, initial encounter: Secondary | ICD-10-CM | POA: Diagnosis not present

## 2023-11-06 DIAGNOSIS — R9431 Abnormal electrocardiogram [ECG] [EKG]: Secondary | ICD-10-CM | POA: Insufficient documentation

## 2023-11-06 DIAGNOSIS — Z885 Allergy status to narcotic agent status: Secondary | ICD-10-CM

## 2023-11-06 DIAGNOSIS — E119 Type 2 diabetes mellitus without complications: Secondary | ICD-10-CM | POA: Diagnosis present

## 2023-11-06 DIAGNOSIS — Z96653 Presence of artificial knee joint, bilateral: Secondary | ICD-10-CM | POA: Diagnosis present

## 2023-11-06 DIAGNOSIS — Z9109 Other allergy status, other than to drugs and biological substances: Secondary | ICD-10-CM

## 2023-11-06 DIAGNOSIS — Z87891 Personal history of nicotine dependence: Secondary | ICD-10-CM

## 2023-11-06 DIAGNOSIS — Z888 Allergy status to other drugs, medicaments and biological substances status: Secondary | ICD-10-CM

## 2023-11-06 DIAGNOSIS — I200 Unstable angina: Principal | ICD-10-CM | POA: Diagnosis present

## 2023-11-06 DIAGNOSIS — I10 Essential (primary) hypertension: Secondary | ICD-10-CM | POA: Diagnosis present

## 2023-11-06 DIAGNOSIS — R0789 Other chest pain: Principal | ICD-10-CM | POA: Diagnosis present

## 2023-11-06 DIAGNOSIS — Z5982 Transportation insecurity: Secondary | ICD-10-CM

## 2023-11-06 DIAGNOSIS — Z91040 Latex allergy status: Secondary | ICD-10-CM

## 2023-11-06 DIAGNOSIS — F39 Unspecified mood [affective] disorder: Secondary | ICD-10-CM | POA: Diagnosis present

## 2023-11-06 DIAGNOSIS — Z8614 Personal history of Methicillin resistant Staphylococcus aureus infection: Secondary | ICD-10-CM

## 2023-11-06 DIAGNOSIS — E785 Hyperlipidemia, unspecified: Secondary | ICD-10-CM | POA: Diagnosis present

## 2023-11-06 DIAGNOSIS — Z886 Allergy status to analgesic agent status: Secondary | ICD-10-CM

## 2023-11-06 DIAGNOSIS — I252 Old myocardial infarction: Secondary | ICD-10-CM

## 2023-11-06 DIAGNOSIS — M797 Fibromyalgia: Secondary | ICD-10-CM | POA: Diagnosis present

## 2023-11-06 DIAGNOSIS — I110 Hypertensive heart disease with heart failure: Secondary | ICD-10-CM | POA: Diagnosis present

## 2023-11-06 DIAGNOSIS — Z801 Family history of malignant neoplasm of trachea, bronchus and lung: Secondary | ICD-10-CM

## 2023-11-06 DIAGNOSIS — Z7985 Long-term (current) use of injectable non-insulin antidiabetic drugs: Secondary | ICD-10-CM

## 2023-11-06 DIAGNOSIS — I493 Ventricular premature depolarization: Secondary | ICD-10-CM | POA: Diagnosis present

## 2023-11-06 DIAGNOSIS — Z91018 Allergy to other foods: Secondary | ICD-10-CM

## 2023-11-06 DIAGNOSIS — Z860100 Personal history of colon polyps, unspecified: Secondary | ICD-10-CM

## 2023-11-06 DIAGNOSIS — R079 Chest pain, unspecified: Secondary | ICD-10-CM | POA: Diagnosis present

## 2023-11-06 DIAGNOSIS — Z79899 Other long term (current) drug therapy: Secondary | ICD-10-CM

## 2023-11-06 DIAGNOSIS — F411 Generalized anxiety disorder: Secondary | ICD-10-CM | POA: Diagnosis present

## 2023-11-06 DIAGNOSIS — G4733 Obstructive sleep apnea (adult) (pediatric): Secondary | ICD-10-CM | POA: Diagnosis present

## 2023-11-06 DIAGNOSIS — G2581 Restless legs syndrome: Secondary | ICD-10-CM | POA: Diagnosis present

## 2023-11-06 DIAGNOSIS — Z87892 Personal history of anaphylaxis: Secondary | ICD-10-CM

## 2023-11-06 DIAGNOSIS — K219 Gastro-esophageal reflux disease without esophagitis: Secondary | ICD-10-CM | POA: Diagnosis present

## 2023-11-06 DIAGNOSIS — Z7982 Long term (current) use of aspirin: Secondary | ICD-10-CM

## 2023-11-06 DIAGNOSIS — Z794 Long term (current) use of insulin: Secondary | ICD-10-CM

## 2023-11-06 DIAGNOSIS — Z6841 Body Mass Index (BMI) 40.0 and over, adult: Secondary | ICD-10-CM

## 2023-11-06 DIAGNOSIS — I25110 Atherosclerotic heart disease of native coronary artery with unstable angina pectoris: Secondary | ICD-10-CM | POA: Diagnosis present

## 2023-11-06 DIAGNOSIS — I5032 Chronic diastolic (congestive) heart failure: Secondary | ICD-10-CM | POA: Diagnosis present

## 2023-11-06 DIAGNOSIS — Z8679 Personal history of other diseases of the circulatory system: Secondary | ICD-10-CM

## 2023-11-06 DIAGNOSIS — Z880 Allergy status to penicillin: Secondary | ICD-10-CM

## 2023-11-06 DIAGNOSIS — Z833 Family history of diabetes mellitus: Secondary | ICD-10-CM

## 2023-11-06 DIAGNOSIS — R519 Headache, unspecified: Secondary | ICD-10-CM | POA: Diagnosis not present

## 2023-11-06 DIAGNOSIS — Z90710 Acquired absence of both cervix and uterus: Secondary | ICD-10-CM

## 2023-11-06 LAB — CBC
HCT: 47.8 % — ABNORMAL HIGH (ref 36.0–46.0)
Hemoglobin: 14.8 g/dL (ref 12.0–15.0)
MCH: 27.4 pg (ref 26.0–34.0)
MCHC: 31 g/dL (ref 30.0–36.0)
MCV: 88.5 fL (ref 80.0–100.0)
Platelets: 404 K/uL — ABNORMAL HIGH (ref 150–400)
RBC: 5.4 MIL/uL — ABNORMAL HIGH (ref 3.87–5.11)
RDW: 14 % (ref 11.5–15.5)
WBC: 8.6 K/uL (ref 4.0–10.5)
nRBC: 0 % (ref 0.0–0.2)

## 2023-11-06 LAB — BASIC METABOLIC PANEL WITH GFR
Anion gap: 12 (ref 5–15)
BUN: 11 mg/dL (ref 6–20)
CO2: 22 mmol/L (ref 22–32)
Calcium: 9 mg/dL (ref 8.9–10.3)
Chloride: 109 mmol/L (ref 98–111)
Creatinine, Ser: 0.72 mg/dL (ref 0.44–1.00)
GFR, Estimated: >60 mL/min (ref >60)
Glucose, Bld: 125 mg/dL — ABNORMAL HIGH (ref 70–99)
Potassium: 3.9 mmol/L (ref 3.5–5.1)
Sodium: 143 mmol/L (ref 135–145)

## 2023-11-06 LAB — TROPONIN I (HIGH SENSITIVITY): Troponin I (High Sensitivity): 5 ng/L (ref <18)

## 2023-11-06 MED ORDER — HYDROMORPHONE HCL 1 MG/ML IJ SOLN
0.5000 mg | Freq: Once | INTRAMUSCULAR | Status: AC
  Administered 2023-11-06: 0.5 mg via INTRAVENOUS
  Filled 2023-11-06: qty 1

## 2023-11-06 MED ORDER — ONDANSETRON HCL 4 MG/2ML IJ SOLN
4.0000 mg | Freq: Once | INTRAMUSCULAR | Status: AC
  Administered 2023-11-06: 4 mg via INTRAVENOUS
  Filled 2023-11-06: qty 2

## 2023-11-06 NOTE — ED Provider Notes (Signed)
 [facility]PT [facility] Emergency Department Provider Note MRN:  [id number]  Arrival date & time: 11/07/23     Chief Complaint   Chest Pain   History of Present Illness   [name] is a 58 y.o. year-old female presents to the ED with chief complaint of chest pains and jitters.  States that the chest pain started yesterday.  States that it wasn't as bad until today.  States that pain comes and goes.  States that it worsened about 3 am this morning. States that it was still intermittent throughout the day until this evening, when it became constant.  She tried nitroglycerin  without any relief.  Chest pain was a 10/10, but is now a 7/10.  States that she has associated SOB. Reports associated lightheadedness, nausea, vomiting x 2 in the ambulance.  Received 324mg  of ASA and 1 nitroglycerin  with EMS without relief. Denies any recent illness.  History provided by patient.   Review of Systems  Pertinent positive and negative review of systems noted in HPI.    Physical Exam   Vitals:   11/07/23 0100 11/07/23 0136  BP: (!) 132/90   Pulse: 89   Resp: 19   Temp:  98.2 F (36.8 C)  SpO2: 97%     CONSTITUTIONAL:  non toxic-appearing, NAD NEURO:  Alert and oriented x 3, CN 3-12 grossly intact EYES:  eyes equal and reactive ENT/NECK:  Supple, no stridor  CARDIO:  normal rate, regular rhythm, appears well-perfused  PULM:  No respiratory distress, CTAB GI/GU:  non-distended,  MSK/SPINE:  No gross deformities, no edema, moves all extremities  SKIN:  no rash, atraumatic   *Additional and/or pertinent findings included in MDM below  Diagnostic and Interventional Summary    EKG Interpretation Date/Time:    Ventricular Rate:    PR Interval:    QRS Duration:    QT Interval:    QTC Calculation:   R Axis:      Text Interpretation:         Labs Reviewed  BASIC METABOLIC PANEL WITH GFR - Abnormal; Notable for the following components:      Result Value   Glucose,  Bld 125 (*)    All other components within normal limits  CBC - Abnormal; Notable for the following components:   RBC 5.40 (*)    HCT 47.8 (*)    Platelets 404 (*)    All other components within normal limits  D-DIMER, QUANTITATIVE  HIV ANTIBODY (ROUTINE TESTING W REFLEX)  CBC  PROTIME-INR  BASIC METABOLIC PANEL WITH GFR  HEMOGLOBIN A1C  TROPONIN I (HIGH SENSITIVITY)  TROPONIN I (HIGH SENSITIVITY)    DG Chest Portable 1 View  Final Result      Medications  aspirin  chewable tablet 81 mg (has no administration in time range)  atorvastatin  (LIPITOR) tablet 10 mg (has no administration in time range)  amLODipine  (NORVASC ) tablet 10 mg (has no administration in time range)  carvedilol  (COREG ) tablet 12.5 mg (has no administration in time range)  nitroGLYCERIN  (NITROSTAT ) SL tablet 0.4 mg (has no administration in time range)  ranolazine  (RANEXA ) 12 hr tablet 1,000 mg (has no administration in time range)  Basaglar  KwikPen KwikPen 30 Units (has no administration in time range)  pantoprazole  (PROTONIX ) EC tablet 40 mg (has no administration in time range)  polyethylene glycol (MIRALAX  / GLYCOLAX ) packet 17 g (has no administration in time range)  cyclobenzaprine  (FLEXERIL ) tablet 10 mg (has no administration in time range)  albuterol  (VENTOLIN  HFA)  108 (90 Base) MCG/ACT inhaler 2 puff (has no administration in time range)  enoxaparin  (LOVENOX ) injection 40 mg (has no administration in time range)  sodium chloride  flush (NS) 0.9 % injection 3 mL (has no administration in time range)  sodium chloride  flush (NS) 0.9 % injection 3 mL (has no administration in time range)  sodium chloride  flush (NS) 0.9 % injection 3 mL (has no administration in time range)  0.9 %  sodium chloride  infusion (has no administration in time range)  ibuprofen  (ADVIL ) tablet 400 mg (has no administration in time range)  insulin  aspart (novoLOG ) injection 0-6 Units (has no administration in time range)   HYDROmorphone  (DILAUDID ) injection 0.5 mg (0.5 mg Intravenous Given 11/06/23 2249)  ondansetron  (ZOFRAN ) injection 4 mg (4 mg Intravenous Given 11/06/23 2249)     Procedures  /  Critical Care Procedures  ED Course and Medical Decision Making  I have reviewed the triage vital signs, the nursing notes, and pertinent available records from the EMR.  Social Determinants Affecting Complexity of Care: Patient has no clinically significant social determinants affecting this chief complaint..   ED Course:    Medical Decision Making Patient here with chest pain that started yesterday.  It was intermittent yesterday, but became constant today.  She is still complaining of chest pain after treatment with pain medicine in the ED.  She did not have much improvement with nitroglycerin  either.  She did receive aspirin  prior to arrival.  Her initial troponins are negative.  She has no acute ischemic changes on EKG.  However, given the worsening and now constant chest pain, feel that she should be admitted for observation.  I discussed case with the hospitalist, who is appreciated for admitting.  Will consult cardiology for recommendations.  Amount and/or Complexity of Data Reviewed Labs: ordered. Radiology: ordered.  Risk Prescription drug management. Decision regarding hospitalization.         Consultants: I consulted with Hospitalist, Dr. Sundil, who is appreciated for admitting. I was awaiting phone call back from cardiology to request consult, but Dr. Lee sent secure chat to cardiology, who will see patient.  Treatment and Plan: Patient's exam and diagnostic results are concerning for ACS.  Feel that patient will need admission to the hospital for further treatment and evaluation.  Patient discussed with attending physician, Dr. Jerral, who agrees with plan.  Final Clinical Impressions(s) / ED Diagnoses     ICD-10-CM   1. Unstable angina (HCC)  I20.0       ED Discharge  Orders     None         Discharge Instructions Discussed with and Provided to Patient:   Discharge Instructions   None      Vicky Charleston, PA-C 11/07/23 0340    Jerral Meth, MD 11/07/23 [phone]

## 2023-11-06 NOTE — ED Triage Notes (Addendum)
 Patient BIB [facility] from home due to chest pain. Patient states pain started last night but was tolerable, states pain became 10/10 this evening feeling like an elephant was sitting on central chest. Non radiating, denies SOB. VSS A&Ox4. Patient received 1 nitroglycerin  and 324 aspirin . Chest pain unresolved with nitroglycerin .

## 2023-11-07 ENCOUNTER — Encounter (HOSPITAL_COMMUNITY): Payer: Self-pay | Admitting: Internal Medicine

## 2023-11-07 DIAGNOSIS — E7849 Other hyperlipidemia: Secondary | ICD-10-CM

## 2023-11-07 DIAGNOSIS — I10 Essential (primary) hypertension: Secondary | ICD-10-CM

## 2023-11-07 DIAGNOSIS — Z91040 Latex allergy status: Secondary | ICD-10-CM | POA: Diagnosis not present

## 2023-11-07 DIAGNOSIS — R9431 Abnormal electrocardiogram [ECG] [EKG]: Secondary | ICD-10-CM | POA: Insufficient documentation

## 2023-11-07 DIAGNOSIS — I5032 Chronic diastolic (congestive) heart failure: Secondary | ICD-10-CM | POA: Diagnosis present

## 2023-11-07 DIAGNOSIS — R072 Precordial pain: Secondary | ICD-10-CM | POA: Diagnosis not present

## 2023-11-07 DIAGNOSIS — Z5982 Transportation insecurity: Secondary | ICD-10-CM | POA: Diagnosis not present

## 2023-11-07 DIAGNOSIS — R079 Chest pain, unspecified: Secondary | ICD-10-CM

## 2023-11-07 DIAGNOSIS — Z794 Long term (current) use of insulin: Secondary | ICD-10-CM | POA: Diagnosis not present

## 2023-11-07 DIAGNOSIS — I252 Old myocardial infarction: Secondary | ICD-10-CM | POA: Diagnosis not present

## 2023-11-07 DIAGNOSIS — K219 Gastro-esophageal reflux disease without esophagitis: Secondary | ICD-10-CM

## 2023-11-07 DIAGNOSIS — Z79899 Other long term (current) drug therapy: Secondary | ICD-10-CM | POA: Diagnosis not present

## 2023-11-07 DIAGNOSIS — E785 Hyperlipidemia, unspecified: Secondary | ICD-10-CM | POA: Diagnosis present

## 2023-11-07 DIAGNOSIS — M797 Fibromyalgia: Secondary | ICD-10-CM

## 2023-11-07 DIAGNOSIS — R0789 Other chest pain: Secondary | ICD-10-CM

## 2023-11-07 DIAGNOSIS — G4733 Obstructive sleep apnea (adult) (pediatric): Secondary | ICD-10-CM | POA: Diagnosis present

## 2023-11-07 DIAGNOSIS — Z833 Family history of diabetes mellitus: Secondary | ICD-10-CM | POA: Diagnosis not present

## 2023-11-07 DIAGNOSIS — I110 Hypertensive heart disease with heart failure: Secondary | ICD-10-CM | POA: Diagnosis present

## 2023-11-07 DIAGNOSIS — F39 Unspecified mood [affective] disorder: Secondary | ICD-10-CM | POA: Diagnosis present

## 2023-11-07 DIAGNOSIS — I25110 Atherosclerotic heart disease of native coronary artery with unstable angina pectoris: Secondary | ICD-10-CM | POA: Diagnosis present

## 2023-11-07 DIAGNOSIS — Z8679 Personal history of other diseases of the circulatory system: Secondary | ICD-10-CM | POA: Diagnosis not present

## 2023-11-07 DIAGNOSIS — Z6841 Body Mass Index (BMI) 40.0 and over, adult: Secondary | ICD-10-CM | POA: Diagnosis not present

## 2023-11-07 DIAGNOSIS — E119 Type 2 diabetes mellitus without complications: Secondary | ICD-10-CM | POA: Diagnosis present

## 2023-11-07 DIAGNOSIS — Z96653 Presence of artificial knee joint, bilateral: Secondary | ICD-10-CM | POA: Diagnosis present

## 2023-11-07 DIAGNOSIS — G2581 Restless legs syndrome: Secondary | ICD-10-CM | POA: Diagnosis present

## 2023-11-07 DIAGNOSIS — Z7985 Long-term (current) use of injectable non-insulin antidiabetic drugs: Secondary | ICD-10-CM | POA: Diagnosis not present

## 2023-11-07 DIAGNOSIS — Z7982 Long term (current) use of aspirin: Secondary | ICD-10-CM | POA: Diagnosis not present

## 2023-11-07 DIAGNOSIS — I200 Unstable angina: Secondary | ICD-10-CM | POA: Diagnosis present

## 2023-11-07 DIAGNOSIS — Z87891 Personal history of nicotine dependence: Secondary | ICD-10-CM | POA: Diagnosis not present

## 2023-11-07 DIAGNOSIS — Z886 Allergy status to analgesic agent status: Secondary | ICD-10-CM | POA: Diagnosis not present

## 2023-11-07 LAB — TROPONIN I (HIGH SENSITIVITY)
Troponin I (High Sensitivity): 6 ng/L (ref <18)
Troponin I (High Sensitivity): 5 ng/L (ref <18)

## 2023-11-07 LAB — CBC
HCT: 41.8 % (ref 36.0–46.0)
Hemoglobin: 13.1 g/dL (ref 12.0–15.0)
MCH: 27.8 pg (ref 26.0–34.0)
MCHC: 31.3 g/dL (ref 30.0–36.0)
MCV: 88.7 fL (ref 80.0–100.0)
Platelets: 328 K/uL (ref 150–400)
RBC: 4.71 MIL/uL (ref 3.87–5.11)
RDW: 13.9 % (ref 11.5–15.5)
WBC: 7.4 K/uL (ref 4.0–10.5)
nRBC: 0 % (ref 0.0–0.2)

## 2023-11-07 LAB — BASIC METABOLIC PANEL WITH GFR
Anion gap: 9 (ref 5–15)
BUN: 12 mg/dL (ref 6–20)
CO2: 23 mmol/L (ref 22–32)
Calcium: 8.4 mg/dL — ABNORMAL LOW (ref 8.9–10.3)
Chloride: 109 mmol/L (ref 98–111)
Creatinine, Ser: 0.66 mg/dL (ref 0.44–1.00)
GFR, Estimated: >60 mL/min (ref >60)
Glucose, Bld: 115 mg/dL — ABNORMAL HIGH (ref 70–99)
Potassium: 3.7 mmol/L (ref 3.5–5.1)
Sodium: 141 mmol/L (ref 135–145)

## 2023-11-07 LAB — GLUCOSE, CAPILLARY
Glucose-Capillary: 120 mg/dL — ABNORMAL HIGH (ref 70–99)
Glucose-Capillary: 107 mg/dL — ABNORMAL HIGH (ref 70–99)
Glucose-Capillary: 101 mg/dL — ABNORMAL HIGH (ref 70–99)
Glucose-Capillary: 129 mg/dL — ABNORMAL HIGH (ref 70–99)
Glucose-Capillary: 110 mg/dL — ABNORMAL HIGH (ref 70–99)

## 2023-11-07 LAB — D-DIMER, QUANTITATIVE: D-Dimer, Quant: <0.27 ug{FEU}/mL (ref 0.00–0.50)

## 2023-11-07 LAB — PROTIME-INR
INR: 1 (ref 0.8–1.2)
Prothrombin Time: 14.2 s (ref 11.4–15.2)

## 2023-11-07 LAB — MRSA NEXT GEN BY PCR, NASAL: MRSA by PCR Next Gen: NOT DETECTED

## 2023-11-07 LAB — HIV ANTIBODY (ROUTINE TESTING W REFLEX): HIV Screen 4th Generation wRfx: NONREACTIVE

## 2023-11-07 LAB — MAGNESIUM: Magnesium: 1.8 mg/dL (ref 1.7–2.4)

## 2023-11-07 MED ORDER — ISOSORBIDE MONONITRATE ER 30 MG PO TB24
30.0000 mg | ORAL_TABLET | Freq: Every day | ORAL | Status: DC
  Administered 2023-11-07: 30 mg via ORAL
  Filled 2023-11-07 (×2): qty 1

## 2023-11-07 MED ORDER — ASPIRIN 81 MG PO CHEW
81.0000 mg | CHEWABLE_TABLET | Freq: Every day | ORAL | Status: DC
  Administered 2023-11-07 – 2023-11-08 (×2): 81 mg via ORAL
  Filled 2023-11-07 (×2): qty 1

## 2023-11-07 MED ORDER — ATORVASTATIN CALCIUM 10 MG PO TABS
10.0000 mg | ORAL_TABLET | Freq: Every day | ORAL | Status: DC
  Administered 2023-11-07: 10 mg via ORAL
  Filled 2023-11-07: qty 1

## 2023-11-07 MED ORDER — CARVEDILOL 12.5 MG PO TABS
12.5000 mg | ORAL_TABLET | Freq: Two times a day (BID) | ORAL | Status: DC
  Administered 2023-11-07 – 2023-11-08 (×3): 12.5 mg via ORAL
  Filled 2023-11-07 (×3): qty 1

## 2023-11-07 MED ORDER — HYDRALAZINE HCL 20 MG/ML IJ SOLN: 10.0000 mg | Freq: Four times a day (QID) | INTRAMUSCULAR | Status: DC | PRN

## 2023-11-07 MED ORDER — SODIUM CHLORIDE 0.9% FLUSH
3.0000 mL | Freq: Two times a day (BID) | INTRAVENOUS | Status: DC
  Administered 2023-11-07 – 2023-11-08 (×4): 3 mL via INTRAVENOUS

## 2023-11-07 MED ORDER — LIDOCAINE 5 % EX PTCH
1.0000 | MEDICATED_PATCH | CUTANEOUS | Status: DC
  Administered 2023-11-07 – 2023-11-08 (×2): 1 via TRANSDERMAL
  Filled 2023-11-07 (×2): qty 1

## 2023-11-07 MED ORDER — AMLODIPINE BESYLATE 10 MG PO TABS
10.0000 mg | ORAL_TABLET | Freq: Every day | ORAL | Status: DC
  Administered 2023-11-07 – 2023-11-08 (×2): 10 mg via ORAL
  Filled 2023-11-07 (×2): qty 1

## 2023-11-07 MED ORDER — INSULIN GLARGINE-YFGN 100 UNIT/ML ~~LOC~~ SOLN
30.0000 [IU] | Freq: Every day | SUBCUTANEOUS | Status: DC
  Administered 2023-11-07 – 2023-11-08 (×2): 30 [IU] via SUBCUTANEOUS
  Filled 2023-11-07 (×4): qty 0.3

## 2023-11-07 MED ORDER — INSULIN ASPART 100 UNIT/ML IJ SOLN: 0.0000 [IU] | Freq: Three times a day (TID) | INTRAMUSCULAR | Status: DC

## 2023-11-07 MED ORDER — SODIUM CHLORIDE 0.9% FLUSH: 3.0000 mL | INTRAVENOUS | Status: DC | PRN

## 2023-11-07 MED ORDER — RANOLAZINE ER 500 MG PO TB12
1000.0000 mg | ORAL_TABLET | Freq: Two times a day (BID) | ORAL | Status: DC
  Administered 2023-11-07 – 2023-11-08 (×3): 1000 mg via ORAL
  Filled 2023-11-07 (×4): qty 2

## 2023-11-07 MED ORDER — SODIUM CHLORIDE 0.9 % IV SOLN: 250.0000 mL | INTRAVENOUS | Status: AC | PRN

## 2023-11-07 MED ORDER — NITROGLYCERIN 0.4 MG SL SUBL: 0.4000 mg | SUBLINGUAL_TABLET | SUBLINGUAL | Status: DC | PRN

## 2023-11-07 MED ORDER — ENOXAPARIN SODIUM 40 MG/0.4ML IJ SOSY
40.0000 mg | PREFILLED_SYRINGE | INTRAMUSCULAR | Status: DC
  Administered 2023-11-07 – 2023-11-08 (×2): 40 mg via SUBCUTANEOUS
  Filled 2023-11-07 (×2): qty 0.4

## 2023-11-07 MED ORDER — AMLODIPINE BESYLATE 10 MG PO TABS: 10.0000 mg | ORAL_TABLET | Freq: Every day | ORAL | Status: DC

## 2023-11-07 MED ORDER — POLYETHYLENE GLYCOL 3350 17 G PO PACK: 17.0000 g | PACK | Freq: Every day | ORAL | Status: DC

## 2023-11-07 MED ORDER — DICLOFENAC SODIUM 1 % EX GEL
2.0000 g | Freq: Three times a day (TID) | CUTANEOUS | Status: DC
  Administered 2023-11-07 – 2023-11-08 (×4): 2 g via TOPICAL
  Filled 2023-11-07: qty 100

## 2023-11-07 MED ORDER — CYCLOBENZAPRINE HCL 10 MG PO TABS
10.0000 mg | ORAL_TABLET | Freq: Every day | ORAL | Status: DC
Start: 2023-11-07 — End: 2023-11-08
  Administered 2023-11-07: 10 mg via ORAL
  Filled 2023-11-07: qty 1

## 2023-11-07 MED ORDER — PANTOPRAZOLE SODIUM 40 MG PO TBEC
40.0000 mg | DELAYED_RELEASE_TABLET | Freq: Every day | ORAL | Status: DC
  Administered 2023-11-07 – 2023-11-08 (×2): 40 mg via ORAL
  Filled 2023-11-07 (×2): qty 1

## 2023-11-07 MED ORDER — ALBUTEROL SULFATE (2.5 MG/3ML) 0.083% IN NEBU: 2.5000 mg | INHALATION_SOLUTION | Freq: Four times a day (QID) | RESPIRATORY_TRACT | Status: DC | PRN

## 2023-11-07 MED ORDER — IBUPROFEN 200 MG PO TABS
400.0000 mg | ORAL_TABLET | Freq: Four times a day (QID) | ORAL | Status: DC | PRN
  Administered 2023-11-07 – 2023-11-08 (×3): 400 mg via ORAL
  Filled 2023-11-07 (×3): qty 2

## 2023-11-07 NOTE — Progress Notes (Signed)
   Progress Note  Patient Name: [name] Date of Encounter: 11/07/2023  Primary Cardiologist: Lamar Fitch, MD  Patient is a 58 year old F known to have nonobstructive CAD (LHC in 2020, 2022), HTN, DM2, fibromyalgia, GERD, OSA on CPAP presented to ER with chest pain x 1 to 2 days.  Reported chest pressure/heaviness, like an elephant sitting on her chest.  Constant.  Chest pain is worse with positional movements, turning her torso towards the side.  After placing the lidocaine  patch this a.m., her chest pain level decreased to 4/10 in intensity, tolerable now.  No other issues.  Assessment and plan  Noncardiac chest pain - Presented with chest pain x 1 to 2 days.  Chest pain worse with position movements, turning her torso towards the side.  Chest pain improved with lidocaine  patch. - Noncardiac/musculoskeletal, pain management for fibromyalgia per primary team. - She underwent LHC in 2020 and 2022, showed nonobstructive CAD. - She was also started on Imdur  30 mg this a.m., will reevaluate her chest pains tomorrow a.m. to rule out coronary vasospasm as contributing etiology.   Signed, Diannah SHAUNNA Maywood, MD  11/07/2023, 10:19 AM

## 2023-11-07 NOTE — Progress Notes (Signed)
 PROGRESS NOTE  [name]  FMW:[id number] DOB: May 10, 1965 DOA: 11/06/2023 PCP: Pura Lenis, MD  Consultants  Brief Narrative: 58 y.o. 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  Patient is stating that send chest pain started yesterday and it was not bad until today.  Reported that intermittent chest pain that started worsening around 3 AM today morning feeling like there is a elephant sitting on her chest.  Patient tried sublingual nitro glycerin at home without any relief.  Also complaining lightheaded with some nausea, vomiting in the ambulance.  Patient received aspirin  load and 1 sublingual nitroglycerin  by EMS without any resolution of chest pain.  Admitted for chest pain and cardiology consulted.   Assessment & Plan: Chest pain, most likely noncardiac - Cardiology following.  Underwent left heart cath in 2020 and 2022 which showed nonobstructive CAD. - Troponins have been normal here. - Cardiology continue to follow and plan to rule out coronary vasospasm by tomorrow as cause of pain. - She has been started on Imdur . - Pain is reproducible on palpation of her chest even with light palpation.  She does report that is improving status post placement of lidocaine  patch   Essential hypertension Chronic diastolic heart failure -Continue amlodipine  and Toprol-XL.  Continue hydralazine  as needed.   Obstructive sleep apnea -Continue CPAP at bedtime.   Fibromyalgia -Continue Zanaflex as needed   Generalized anxiety disorder Mood disorder - Pending verification of home antianxieties and antipsychotic medications.   Insulin -dependent DM type II Continue long-acting insulin  and sliding scale insulin  with mealtime coverage.      DVT prophylaxis:  enoxaparin  (LOVENOX ) injection 40 mg Start: 11/07/23 1000 SCDs Start: 11/07/23 0205 Place TED hose Start: 11/07/23 0205  Code Status:   Code Status: Full Code Family Communication: Husband at bedside and all questions answered Level of care: Telemetry Cardiac Status is: Observation  Consults called: Cardiology  Subjective: Patient had lidocaine  patch recently placed and feels better since that.  Pain is abating.  She is getting ready to eat lunch.  Feels hungry.  No nausea or vomiting.  Objective: Vitals:   11/07/23 0500 11/07/23 0600 11/07/23 0700 11/07/23 1109  BP: (!) 153/87 124/78 (!) 135/94 129/72  Pulse: 84 84 84 78  Resp: 17 16 (!) 22 19  Temp:   98.3 F (36.8 C) 98 F (36.7 C)  TempSrc:   Oral Oral  SpO2: 93% 94% 97% 95%  Weight:      Height:       No intake or output data in the 24 hours ending 11/07/23 1218 Filed Weights   11/06/23 2158 11/07/23 0256  Weight: 101.2 kg 107.6 kg   Body mass index is 47.91 kg/m.  Gen: 58 y.o. female in no apparent distress.  Nontoxic Pulm: Non-labored breathing.  Clear to auscultation bilaterally.  CV: Regular rate and rhythm. No murmur, rub, or gallop. No JVD Chest: Tender to palpation across upper sternum GI: Abdomen soft, non-tender, non-distended, with normoactive bowel sounds. No organomegaly or masses felt. Ext: Warm, no deformities, no pedal edema Skin: No rashes, lesions no ulcers Neuro: Alert and oriented. No focal neurological deficits. Psych: Calm  Judgement and insight appear normal. Mood & affect appropriate.     I have personally reviewed the following labs and images: CBC: Recent Labs  Lab 11/06/23 2200  11/07/23 0457  WBC 8.6 7.4  HGB 14.8 13.1  HCT 47.8* 41.8  MCV 88.5 88.7  PLT 404* 328   BMP &GFR Recent Labs  Lab 11/06/23 2200 11/07/23 0457 11/07/23 0750  NA 143 141  --   K 3.9 3.7  --   CL 109 109  --   CO2 22 23  --   GLUCOSE 125* 115*  --   BUN 11 12  --    CREATININE 0.72 0.66  --   CALCIUM  9.0 8.4*  --   MG  --   --  1.8   Estimated Creatinine Clearance: 82.5 mL/min (by C-G formula based on SCr of 0.66 mg/dL). Liver & Pancreas: No results for input(s): AST, ALT, ALKPHOS, BILITOT, PROT, ALBUMIN in the last 168 hours. No results for input(s): LIPASE, AMYLASE in the last 168 hours. No results for input(s): AMMONIA in the last 168 hours. Diabetic: No results for input(s): HGBA1C in the last 72 hours. Recent Labs  Lab 11/07/23 0305 11/07/23 0601 11/07/23 1127  GLUCAP 120* 107* 101*   Cardiac Enzymes: No results for input(s): CKTOTAL, CKMB, CKMBINDEX, TROPONINI in the last 168 hours. No results for input(s): PROBNP in the last 8760 hours. Coagulation Profile: Recent Labs  Lab 11/07/23 0457  INR 1.0   Thyroid Function Tests: No results for input(s): TSH, T4TOTAL, FREET4, T3FREE, THYROIDAB in the last 72 hours. Lipid Profile: No results for input(s): CHOL, HDL, LDLCALC, TRIG, CHOLHDL, LDLDIRECT in the last 72 hours. Anemia Panel: No results for input(s): VITAMINB12, FOLATE, FERRITIN, TIBC, IRON, RETICCTPCT in the last 72 hours. Urine analysis:    Component Value Date/Time   COLORURINE YELLOW 12/16/2019 1943   APPEARANCEUR CLEAR 12/16/2019 1943   LABSPEC 1.011 12/16/2019 1943   PHURINE 6.0 12/16/2019 1943   GLUCOSEU >=500 (A) 12/16/2019 1943   HGBUR NEGATIVE 12/16/2019 1943   BILIRUBINUR NEGATIVE 12/16/2019 1943   KETONESUR NEGATIVE 12/16/2019 1943   PROTEINUR NEGATIVE 12/16/2019 1943   UROBILINOGEN 0.2 01/25/2013 0815   NITRITE NEGATIVE 12/16/2019 1943   LEUKOCYTESUR NEGATIVE 12/16/2019 1943   Sepsis Labs: Invalid input(s): PROCALCITONIN, LACTICIDVEN  Microbiology: Recent Results (from the past 240 hours)  MRSA Next Gen by PCR, Nasal     Status: None   Collection Time: 11/07/23  3:42 AM   Specimen: Nasal Mucosa; Nasal Swab  Result Value Ref Range  Status   MRSA by PCR Next Gen NOT DETECTED NOT DETECTED Final    Comment: (NOTE) The GeneXpert MRSA Assay (FDA approved for NASAL specimens only), is one component of a comprehensive MRSA colonization surveillance program. It is not intended to diagnose MRSA infection nor to guide or monitor treatment for MRSA infections. Test performance is not FDA approved in patients less than 64 years old. Performed at [facility], [address], [place], [place] [address]     Radiology Studies: DG Chest Portable 1 View Result Date: 11/06/2023 CLINICAL DATA:  Chest pain EXAM: PORTABLE CHEST 1 VIEW COMPARISON:  09/17/2022 FINDINGS: Borderline to mild cardiomegaly. Atelectasis or scarring at the left base. No acute airspace disease, pleural effusion or pneumothorax. IMPRESSION: Borderline to mild cardiomegaly. Atelectasis or scarring at the left base. Electronically Signed   By: Luke Bun M.D.   On: 11/06/2023 22:17    Scheduled Meds:  amLODipine   10 mg Oral Daily   aspirin   81 mg Oral Daily   atorvastatin   10 mg Oral QHS   carvedilol   12.5 mg Oral BID WC   cyclobenzaprine   10 mg Oral QHS   diclofenac  Sodium  2 g Topical Q8H   enoxaparin  (LOVENOX ) injection  40 mg Subcutaneous Q24H   insulin  aspart  0-6 Units Subcutaneous TID WC   insulin  glargine-yfgn  30 Units Subcutaneous Daily   isosorbide  mononitrate  30 mg Oral Daily   lidocaine   1 patch Transdermal Q24H   pantoprazole   40 mg Oral Daily   ranolazine   1,000 mg Oral BID   sodium chloride  flush  3 mL Intravenous Q12H   sodium chloride  flush  3 mL Intravenous Q12H   Continuous Infusions:  sodium chloride        LOS: 0 days   35 minutes with more than 50% spent in reviewing records, counseling patient/family and coordinating care.  Reyes VEAR Gaw, MD [facility] [email or web address] 11/07/2023, 12:18 PM

## 2023-11-07 NOTE — Plan of Care (Signed)
  Problem: Pain Managment: Goal: General experience of comfort will improve and/or be controlled Outcome: Progressing   Problem: Safety: Goal: Ability to remain free from injury will improve Outcome: Progressing   Problem: Skin Integrity: Goal: Risk for impaired skin integrity will decrease Outcome: Progressing

## 2023-11-07 NOTE — Progress Notes (Signed)
 RT attempted to place CPAP on patient for the evening. Patient did not like our mask and prefers to bring her own from home tomorrow. Patient states she is okay to rest on room air for the night, RT removed machine from room.

## 2023-11-07 NOTE — H&P (Signed)
 History and Physical    [name] FMW:[id number] DOB: 05/24/1965 DOA: 11/06/2023  PCP: Pura Lenis, MD   Patient coming from: Home   Chief Complaint:  Chief Complaint  Patient presents with   Chest Pain   ED TRIAGE note:Patient BIB [facility] from home due to chest pain. Patient states pain started last night but was tolerable, states pain became 10/10 this evening feeling like an elephant was sitting on central chest. Non radiating, denies SOB. VSS A&Ox4. Patient received 1 nitroglycerin  and 324 aspirin . Chest pain unresolved with nitroglycerin .   HPI:  [name] is a 58 y.o. 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  Patient is stating that send chest pain started yesterday and it was not bad until today.  Reported that intermittent chest pain that started worsening around 3 AM today morning feeling like there is a elephant sitting on her chest.  Patient tried sublingual nitro glycerin at home without any relief.  Also complaining lightheaded with some nausea, vomiting in the ambulance.  Patient received aspirin  load and 1 sublingual nitroglycerin  by EMS without any resolution of chest pain.  During my evaluation at the bedside physical exam revealed that patient has chest pain on palpation of both left and right sided upper and mid chest wall.  Patient is complaining about intermittent left-sided chest pain and bilateral lower rib cage/underneath breasts muscle tightness as well. Chest pain is nonradiating and dull in nature.  There is no associated nausea, orthopnea, dyspnea diaphoresis and palpitation.   Chart review patient has similar episodes of chest pain and further workup revealed coronary vasospasm and currently on  medical management. Per chart review patient has previous heart cath in 10/2020: Cath: 10/08/20   Ost LAD lesion is 25% stenosed. LV end diastolic pressure is mildly elevated. The left ventricular systolic function is normal. The left ventricular ejection fraction is 55-65% by visual estimate Nonobstructive CAD unchanged from 2020.  Normal left ventricular function and mildly elevated left ventricular end-diastolic pressure.  ED Course:  At presentation to ED patient is borderline hypertensive otherwise hemodynamically stable. Troponin x 2 within normal range.  Pending D-dimer level. CBC and BMP unremarkable.  EKG showed normal sinus rhythm with premature ventricular complex, heart rate 95, right axis deviation and T wave abnormality in V3.  Prolonged QTc 505 msec.  In the ED patient received Dilaudid  and Zofran .  In the ED patient is still complaining about chest pain 6 out of 10 even though patient has normal troponin.  It is highly possible that patient probably having underlying coronary vasospasm as previous episodes. Requested ED physician to consult cardiology regarding further recommendation.  Hospitalist has been consulted for further evaluation and management of chest pain.   Significant labs in the ED: Lab Orders         MRSA Next Gen by PCR, Nasal         Basic metabolic panel         CBC         D-dimer, quantitative         HIV Antibody (routine testing w rflx)         CBC         Protime-INR         Basic metabolic panel  Hemoglobin A1c         Glucose, capillary       Review of Systems:  Review of Systems  Constitutional:  Negative for chills, fever, malaise/fatigue and weight loss.  Respiratory:  Negative for cough, sputum production, shortness of breath and wheezing.   Cardiovascular:  Positive for chest pain. Negative for palpitations, orthopnea and PND.  Gastrointestinal:  Negative for abdominal pain, diarrhea, heartburn, nausea and vomiting.   Genitourinary:  Negative for dysuria and urgency.  Musculoskeletal:  Negative for back pain, falls, joint pain, myalgias and neck pain.  Neurological:  Negative for dizziness and headaches.  Psychiatric/Behavioral:  The patient is not nervous/anxious.     Past Medical History:  Diagnosis Date   Abnormal mammogram of left breast 01/20/2016   Abnormal stress test    Anemia, iron deficiency 02/06/2013   Anxiety    Anxiety state 10/31/2008   Arthritis    osteoarthritis-knees   Asthma    Breast fibroadenoma, left    Breast mass, left 06/29/2017   Chest pain 02/09/2019   Depression    Diabetes mellitus without complication (HCC)    Disease of eye characterized by increased eye pressure    no eye drops used-some increased pressure   DOE (dyspnea on exertion) 11/22/2017   Elevated glucose 03/02/2016   Fibromyalgia 12/17/2015   Genetic testing 07/12/2017   Formatting of this note might be different from the original. Negative Breast Cancer STAT panel (9 genes) through [facility]  Last Assessment & Plan:  Formatting of this note might be different from the original.   GERD (gastroesophageal reflux disease)    Headache(784.0)    occ. with allergies/sinus issues   History of colon polyps 04/2016   History of MRSA infection 2007   History of vitamin D deficiency 03/02/2016   Hypertension    no problems since weight loss -'09   Insomnia 02/06/2013   Multiple food allergies    Latex allergy-respiratory   Obesity    Pain in left knee 12/31/2017   Personal history of tobacco use, presenting hazards to health 06/27/2013   Restless leg syndrome 05/15/2019   Right upper quadrant abdominal pain 07/15/2016   S/P left TKA 01/31/2013   Screening for cervical cancer 12/19/2016   Formatting of this note might be different from the original. Status post hysterectomy   Skin sensation disturbance 12/03/2012   Formatting of this note might be different from the original. STORY: She presented to the emergency room  with left arm discomfort paresthesia hypertension and a history of some weakness of her face.  CT scan of her head was negative.  Labs were normal. Formatting of this note might be different from the original. Overview:  Overview:  STORY: She presented to the emergency room with left arm discom   Syncope 02/09/2019   Tendonitis    Toxic effect of venom 12/16/2011   Formatting of this note might be different from the original. STORY: Dr Alain: always carry epi pen.   Vitamin D deficiency    history of    Past Surgical History:  Procedure Laterality Date   ABDOMINAL HYSTERECTOMY     BOWEL RESECTION     CESAREAN SECTION     x4   COLONOSCOPY W/ POLYPECTOMY  04/2016   KNEE SURGERY     Right knee scope 8'12   LEFT HEART CATH AND CORONARY ANGIOGRAPHY N/A 02/13/2019   Procedure: LEFT HEART CATH AND CORONARY ANGIOGRAPHY;  Surgeon: Mady Bruckner, MD;  Location: [facility] INVASIVE  CV LAB;  Service: Cardiovascular;  Laterality: N/A;   LEFT HEART CATH AND CORONARY ANGIOGRAPHY N/A 10/08/2020   Procedure: LEFT HEART CATH AND CORONARY ANGIOGRAPHY;  Surgeon: [place], Peter M, MD;  Location: [facility] INVASIVE CV LAB;  Service: Cardiovascular;  Laterality: N/A;   TOTAL KNEE ARTHROPLASTY Right 01/31/2013   Procedure: RIGHT TOTAL KNEE ARTHROPLASTY;  Surgeon: Donnice JONETTA Car, MD;  Location: [facility] ORS;  Service: Orthopedics;  Laterality: Right;   TOTAL KNEE ARTHROPLASTY Left 02/15/2018   Procedure: LEFT TOTAL KNEE ARTHROPLASTY;  Surgeon: Car Donnice, MD;  Location: [facility] ORS;  Service: Orthopedics;  Laterality: Left;  68     reports that she quit smoking about 18 years ago. She has never used smokeless tobacco. She reports that she does not drink alcohol and does not use drugs.  Allergies  Allergen Reactions   Banana Shortness Of Breath and Swelling   Cannabinoids Anaphylaxis   Dog Epithelium Hives and Itching   Grass Pollen(K-O-R-T-Swt Vern) Hives and Itching   Insect Wax Anaphylaxis    Any Insect that  bites   Latex Shortness Of Breath   Lisinopril -Hydrochlorothiazide Swelling and Shortness Of Breath    Swelling (had epi by ems and required ED visit)   Onion Shortness Of Breath and Swelling   Penicillins Anaphylaxis, Swelling and Other (See Comments)    Has patient had a PCN reaction causing immediate rash, facial/tongue/throat swelling, SOB or lightheadedness with hypotension: Yes Has patient had a PCN reaction causing severe rash involving mucus membranes or skin necrosis: No Has patient had a PCN reaction that required hospitalization: Yes - inpatient Has patient had a PCN reaction occurring within the last 10 years: No If all of the above answers are NO, then may proceed with Cephalosporin use.    Tobacco Anaphylaxis   Tylenol [Acetaminophen] Other (See Comments)    Throat swelling    American Cockroach Hives   Cat Dander Hives   Murine-Derived Products Hives   Codeine Swelling   Hydrocodone Hives and Swelling   Other Other (See Comments)    Birds - sneezing & itching    Family History  Problem Relation Age of Onset   Lung cancer Mother    Diabetes Father    Colon cancer Neg Hx     Prior to Admission medications   Medication Sig Start Date End Date Taking? Authorizing Provider  albuterol  (VENTOLIN  HFA) 108 (90 Base) MCG/ACT inhaler Inhale 2 puffs into the lungs every 6 (six) hours as needed for wheezing or shortness of breath.    [provider]  amLODipine  (NORVASC ) 10 MG tablet Take 1 tablet (10 mg total) by mouth daily. Patient needs an appointment for further refills. 3 rd/ final attempt 12/24/22   Bernie Lamar PARAS, MD  aspirin  81 MG chewable tablet Chew 81 mg by mouth daily.    [provider]  atorvastatin  (LIPITOR) 10 MG tablet Take 10 mg by mouth at bedtime.    [provider]  BREO ELLIPTA  200-25 MCG/INH AEPB Inhale 1 puff into the lungs daily as needed (wheezing). 10/30/20   [provider]  calcium  carbonate (OS-CAL - DOSED  IN MG OF ELEMENTAL CALCIUM ) 1250 (500 Ca) MG tablet Take 1 tablet by mouth daily with breakfast. Unknown strenght    [provider]  carvedilol  (COREG ) 12.5 MG tablet Take 1 tablet (12.5 mg total) by mouth 2 (two) times daily. Patient needs an appointment for further refills. 3 rd/ final attempt 12/24/22   Bernie Lamar PARAS, MD  cholecalciferol (VITAMIN D3) 25 MCG (1000 UNIT) tablet Take 1,000 Units by mouth daily.    [provider]  cyclobenzaprine  (FLEXERIL ) 10 MG tablet Take 10 mg by mouth at bedtime. 09/28/20   [provider]  diazepam  (VALIUM ) 10 MG tablet Take 10 mg by mouth 4 (four) times daily.    [provider]  EPINEPHrine  0.3 mg/0.3 mL IJ SOAJ injection Inject 0.3 mg into the muscle as needed for anaphylaxis. Patient taking differently: Inject 0.3 mg into the muscle once as needed for anaphylaxis. 06/21/20   Micky Camellia BROCKS, MD  fluticasone  (FLONASE ) 50 MCG/ACT nasal spray Place 2 sprays into both nostrils daily as needed for allergies.     [provider]  hydrOXYzine  (ATARAX ) 50 MG tablet Take 50 mg by mouth daily. 09/03/22   [provider]  Insulin  Glargine (BASAGLAR  KWIKPEN) 100 UNIT/ML Inject 30 Units into the skin daily. 11/07/20   [provider]  montelukast  (SINGULAIR ) 10 MG tablet Take 10 mg by mouth daily. 10/06/17   [provider]  nitroGLYCERIN  (NITROSTAT ) 0.3 MG SL tablet Place 0.3 mg under the tongue every 5 (five) minutes as needed for chest pain. 11/07/20   [provider]  omeprazole (PRILOSEC) 20 MG capsule Take 20 mg by mouth daily.    [provider]  polyethylene glycol (MIRALAX  / GLYCOLAX ) 17 g packet Take 17 g by mouth daily.    [provider]  QUEtiapine  (SEROQUEL ) 200 MG tablet Take 200 mg by mouth at bedtime. 09/28/20   [provider]  raloxifene (EVISTA) 60 MG tablet Take 60 mg by mouth daily. 02/14/19   [provider]  raNITIdine  HCl (RANITIDINE  150  MAX STRENGTH PO) Take 150 mg by mouth daily as needed (allergy).    [provider]  ranolazine  (RANEXA ) 1000 MG SR tablet Take 1 tablet (1,000 mg total) by mouth 2 (two) times daily. Patient needs an appointment for further refills. 3 rd/ final attempt 12/24/22   Bernie Lamar PARAS, MD  rOPINIRole  (REQUIP ) 4 MG tablet Take 4 mg by mouth at bedtime. 08/20/20   [provider]  TRULICITY 3 MG/0.5ML SOPN Inject 3 mg into the skin once a week. Tuesdays 02/10/21   [provider]  VRAYLAR 3 MG capsule Take 3 mg by mouth every morning. 08/21/22   [provider]     Physical Exam: Vitals:   11/07/23 0230 11/07/23 0256 11/07/23 0321 11/07/23 0322  BP: (!) 143/93 (!) 155/103 (!) 176/104 (!) 176/104  Pulse: 87 92  87  Resp: (!) 22 15    Temp:  98.7 F (37.1 C)    TempSrc:  Oral    SpO2: 99% 96%    Weight:  107.6 kg    Height:  4' 11 (1.499 m)      Physical Exam Vitals and nursing note reviewed.  Constitutional:      Appearance: She is obese.  Cardiovascular:     Rate and Rhythm: Normal rate and regular rhythm.     Heart sounds: Normal heart sounds.  Pulmonary:     Effort: Pulmonary effort is normal.     Breath sounds: Normal breath sounds.  Chest:     Chest wall: Tenderness present. No deformity, crepitus or edema.  Breasts:    Right: No swelling, mass or skin change.     Left: No swelling or mass.       Comments: Tenderness on palpation bilateral rib cage area. Musculoskeletal:  General: Tenderness present. No swelling.     Cervical back: Neck supple.     Right lower leg: No edema.     Left lower leg: No edema.  Skin:    Capillary Refill: Capillary refill takes less than 2 seconds.  Neurological:     Mental Status: She is alert and oriented to person, place, and time.  Psychiatric:        Mood and Affect: Mood normal.      Labs on Admission: I have personally reviewed following labs and imaging studies  CBC: Recent Labs  Lab  11/06/23 2200  WBC 8.6  HGB 14.8  HCT 47.8*  MCV 88.5  PLT 404*   Basic Metabolic Panel: Recent Labs  Lab 11/06/23 2200  NA 143  K 3.9  CL 109  CO2 22  GLUCOSE 125*  BUN 11  CREATININE 0.72  CALCIUM  9.0   GFR: Estimated Creatinine Clearance: 82.5 mL/min (by C-G formula based on SCr of 0.72 mg/dL). Liver Function Tests: No results for input(s): AST, ALT, ALKPHOS, BILITOT, PROT, ALBUMIN in the last 168 hours. No results for input(s): LIPASE, AMYLASE in the last 168 hours. No results for input(s): AMMONIA in the last 168 hours. Coagulation Profile: No results for input(s): INR, PROTIME in the last 168 hours. Cardiac Enzymes: Recent Labs  Lab 11/06/23 2200 11/07/23 0014  TROPONINIHS 5 6   BNP (last 3 results) No results for input(s): BNP in the last 8760 hours. HbA1C: No results for input(s): HGBA1C in the last 72 hours. CBG: Recent Labs  Lab 11/07/23 0305  GLUCAP 120*   Lipid Profile: No results for input(s): CHOL, HDL, LDLCALC, TRIG, CHOLHDL, LDLDIRECT in the last 72 hours. Thyroid Function Tests: No results for input(s): TSH, T4TOTAL, FREET4, T3FREE, THYROIDAB in the last 72 hours. Anemia Panel: No results for input(s): VITAMINB12, FOLATE, FERRITIN, TIBC, IRON, RETICCTPCT in the last 72 hours. Urine analysis:    Component Value Date/Time   COLORURINE YELLOW 12/16/2019 1943   APPEARANCEUR CLEAR 12/16/2019 1943   LABSPEC 1.011 12/16/2019 1943   PHURINE 6.0 12/16/2019 1943   GLUCOSEU >=500 (A) 12/16/2019 1943   HGBUR NEGATIVE 12/16/2019 1943   BILIRUBINUR NEGATIVE 12/16/2019 1943   KETONESUR NEGATIVE 12/16/2019 1943   PROTEINUR NEGATIVE 12/16/2019 1943   UROBILINOGEN 0.2 01/25/2013 0815   NITRITE NEGATIVE 12/16/2019 1943   LEUKOCYTESUR NEGATIVE 12/16/2019 1943    Radiological Exams on Admission: I have personally reviewed images DG Chest Portable 1 View Result Date: 11/06/2023 CLINICAL DATA:   Chest pain EXAM: PORTABLE CHEST 1 VIEW COMPARISON:  09/17/2022 FINDINGS: Borderline to mild cardiomegaly. Atelectasis or scarring at the left base. No acute airspace disease, pleural effusion or pneumothorax. IMPRESSION: Borderline to mild cardiomegaly. Atelectasis or scarring at the left base. Electronically Signed   By: Luke Bun M.D.   On: 11/06/2023 22:17     EKG: My personal interpretation of EKG shows: EKG showed normal sinus rhythm with premature ventricular complex, heart rate 95, right axis deviation and T wave abnormality in V3.  Prolonged QTc 505 msec.    Assessment/Plan: Principal Problem:   Chest pain Active Problems:   Unstable angina (HCC)   Essential hypertension   GERD (gastroesophageal reflux disease)   Insulin  dependent type 2 diabetes mellitus (HCC)   Obstructive sleep apnea   Fibromyalgia   History of coronary vasospasm   Hyperlipidemia   Chronic diastolic CHF (congestive heart failure) (HCC)   QT prolongation    Assessment and Plan: Chest pain-unstable angina vs  costochondritis vs fibromyalgia History of unstable angina History of  CAD-secondary to coronary vasospasm History of NSTEMI in 2022 Patient present emergency department with complaining of chest pain for last 2 days.  Initially patient has intermediate quality mid substernal chest pain however for last night 3 AM 8/2 patient has persistent chest pain which has been progressively getting worse 10 out of 10 not relieving with sublingual nitroglycerin  at home.  Patient reported feeling sensation of elephant sitting on the chest.  Patient also reported associated lightheadedness. -Physical exam revealed bilateral chest wall tenderness on palpation. - Presentation to ED patient is hemodynamically stable.  Troponin x 2 unremarkable.  EKG showed normal sinus rhythm with premature ventricular complex heart rate 95 right axis deviation and T wave abnormality lead III.  Prolonged QTc. Normal D-dimer level. -  Per chart review patient has similar episodes of chest pain in 2022 underwent heart cath which showed left ostial stenosis 20% secondary to coronary vasospasm. Also patient has history of unstable angina on medical management. - Intraoperatively patient has been given aspirin  load and sublingual nitroglycerin  without any resolution of the chest pain.  In the ED patient is still complaining about chest pain 6 out of 10. -Chest x-ray showing hyperinflation and no acute cardiopulmonary abnormality. - Concern for coronary vasospasm like previous episodes. -Continue home medications include Ranexa  1000 mg twice daily. Continue Coreg  12.5 mg twice daily. -Continue Lipitor and aspirin . -Continue to trend troponin. -Obtain echocardiogram. -Informed ED physician to consult cardiology for further evaluation management of chest pain and unstable angina.   Update, - Discussed case with on-call cardiology low suspicion of cardiac origin chest pain and concern for  costochondritis vs underlying fibromyalgia.  Recommending to do do trial of lidocaine  patch of the left-sided chest wall to see resolution of  chest pain.  Prolonged QTc interval Prolonged QTc interval 505 ms.  Need to abort further QTc prolonging medications.  Essential hypertension Chronic diastolic heart failure -Continue amlodipine  and Toprol-XL.  Continue hydralazine  as needed.  Obstructive sleep apnea -Continue CPAP at bedtime.  Fibromyalgia -Continue Zanaflex as needed  Generalized anxiety disorder Mood disorder - Pending verification of home antianxieties and antipsychotic medications.  Insulin -dependent DM type II Continue long-acting insulin  and sliding scale insulin  with mealtime coverage.   DVT prophylaxis:  Lovenox  Code Status:  Full Code Diet: Heart healthy carb modified diet Family Communication:   Family was present at bedside, at the time of interview. Opportunity was given to ask question and all questions were  answered satisfactorily.  Disposition Plan: Continue to monitor improvement of chest pain, trend troponin and follow-up with echocardiogram. Consults: Cardiology Admission status:   Observation, Telemetry bed  Severity of Illness: The appropriate patient status for this patient is OBSERVATION. Observation status is judged to be reasonable and necessary in order to provide the required intensity of service to ensure the patient's safety. The patient's presenting symptoms, physical exam findings, and initial radiographic and laboratory data in the context of their medical condition is felt to place them at decreased risk for further clinical deterioration. Furthermore, it is anticipated that the patient will be medically stable for discharge from the hospital within 2 midnights of admission.     Cheri Ayotte, MD [facility]  How to contact the [facility] Attending or Consulting provider 7A - 7P or covering provider during after hours 7P -7A, for this patient.  Check the care team in [facility] and look for a) attending/consulting [facility] provider listed and b) the [facility] team listed  Log into [email or web address] and use [facility] universal password to access. If you do not have the password, please contact the hospital operator. Locate the [facility] provider you are looking for under [facility] and page to a number that you can be directly reached. If you still have difficulty reaching the provider, please page the [facility] (Director on Call) for the Hospitalists listed on amion for assistance.  11/07/2023, 4:14 AM

## 2023-11-07 NOTE — Consult Note (Cosign Needed)
 Cardiology Consultation   Patient ID: [name] MRN: [id number]; DOB: 09-19-1965  Admit date: 11/06/2023 Date of Consult: 11/07/2023  PCP:  Pura Lenis, MD   [facility] Providers Cardiologist:  Lamar Fitch, MD        History of Present Illness:  [name] is a 58 y.o. female with non-obstructive CAD, HTN, DM, fibromyalgia, GERD, RLS, OSA (on CPAP), who is being seen 11/07/2023 for the evaluation of chest pain at the request of hospital medicine.  Patient has a history of a prior cardiac catheterization in 2020 that demonstrated non-obstructive CAD with a positive stress test. She also presented in 10/2020 with lower chest/abdominal pain with associated shortness of breath for 6 months. On the day prior to that admission she began having chest pressure and was started on a heparin  gtt and nitroglycerin  gtt. She underwent cardiac catheterization on 10/2020, which demonstrated a 25% ostial LAD lesion with a mildly elevated LVEDP; LVEF 55-60% on TTE with grade 1 diastolic dysfunction. Troponin peaked at 280. It was thought that she may have microvascular dysfunction or coronary vasospasm, therefore she was started on amlodipine  5 mg daily. Patient was last seen by Dr. Fitch in 09/25/21 at which time there was suspicion for a microvascular circulation component or vasospastic component. She had been uptitrated on anti-anginals.  Patient presented on 8/2 with 10/10 chest pain at 3AM feeling like an elephant was sitting on her chest. Non-radiating, no associated shortness of breath. She reported her chest pain has been on/off. She tried SL nitroglycerin  without any relief. She also received ASA 324 mg and nitroglycerin  by EMS without improvement of her chest pain.   In the ER, Troponin neg x2, CBC/BMP unremarkable. EKG demonstrated NSR with PVCs, HR 95, Qtc 505. Patient was given dilaudid  and zofran .  Upon arrival to the floor, patient had initially denied chest pain. She  subsequently developed substernal chest pressure similar to prior leading to her presentation. She reports no prior similar chest pain since 2022. Upon my evaluation, she was noted to be extremely tender to palpation, even to light touch across the sternum and left sided muscles. She also noted moving her torso worsened her chest pain.    PMH   Past Medical History:  Diagnosis Date   Abnormal mammogram of left breast 01/20/2016   Abnormal stress test    Anemia, iron deficiency 02/06/2013   Anxiety    Anxiety state 10/31/2008   Arthritis    osteoarthritis-knees   Asthma    Breast fibroadenoma, left    Breast mass, left 06/29/2017   Chest pain 02/09/2019   Depression    Diabetes mellitus without complication (HCC)    Disease of eye characterized by increased eye pressure    no eye drops used-some increased pressure   DOE (dyspnea on exertion) 11/22/2017   Elevated glucose 03/02/2016   Fibromyalgia 12/17/2015   Genetic testing 07/12/2017   Formatting of this note might be different from the original. Negative Breast Cancer STAT panel (9 genes) through [facility]  Last Assessment & Plan:  Formatting of this note might be different from the original.   GERD (gastroesophageal reflux disease)    Headache(784.0)    occ. with allergies/sinus issues   History of colon polyps 04/2016   History of MRSA infection 2007   History of vitamin D deficiency 03/02/2016   Hypertension    no problems since weight loss -'09   Insomnia 02/06/2013   Multiple food allergies    Latex allergy-respiratory  Obesity    Pain in left knee 12/31/2017   Personal history of tobacco use, presenting hazards to health 06/27/2013   Restless leg syndrome 05/15/2019   Right upper quadrant abdominal pain 07/15/2016   S/P left TKA 01/31/2013   Screening for cervical cancer 12/19/2016   Formatting of this note might be different from the original. Status post hysterectomy   Skin sensation disturbance 12/03/2012   Formatting of this  note might be different from the original. STORY: She presented to the emergency room with left arm discomfort paresthesia hypertension and a history of some weakness of her face.  CT scan of her head was negative.  Labs were normal. Formatting of this note might be different from the original. Overview:  Overview:  STORY: She presented to the emergency room with left arm discom   Syncope 02/09/2019   Tendonitis    Toxic effect of venom 12/16/2011   Formatting of this note might be different from the original. STORY: Dr Alain: always carry epi pen.   Vitamin D deficiency    history of    Past Surgical History:  Procedure Laterality Date   ABDOMINAL HYSTERECTOMY     BOWEL RESECTION     CESAREAN SECTION     x4   COLONOSCOPY W/ POLYPECTOMY  04/2016   KNEE SURGERY     Right knee scope 8'12   LEFT HEART CATH AND CORONARY ANGIOGRAPHY N/A 02/13/2019   Procedure: LEFT HEART CATH AND CORONARY ANGIOGRAPHY;  Surgeon: Mady Bruckner, MD;  Location: [facility] INVASIVE CV LAB;  Service: Cardiovascular;  Laterality: N/A;   LEFT HEART CATH AND CORONARY ANGIOGRAPHY N/A 10/08/2020   Procedure: LEFT HEART CATH AND CORONARY ANGIOGRAPHY;  Surgeon: Swazil[place], Peter M, MD;  Location: [facility] INVASIVE CV LAB;  Service: Cardiovascular;  Laterality: N/A;   TOTAL KNEE ARTHROPLASTY Right 01/31/2013   Procedure: RIGHT TOTAL KNEE ARTHROPLASTY;  Surgeon: Donnice JONETTA Car, MD;  Location: [facility] ORS;  Service: Orthopedics;  Laterality: Right;   TOTAL KNEE ARTHROPLASTY Left 02/15/2018   Procedure: LEFT TOTAL KNEE ARTHROPLASTY;  Surgeon: Car Donnice, MD;  Location: [facility] ORS;  Service: Orthopedics;  Laterality: Left;  68       Inpatient Medications: Scheduled Meds:  amLODipine   10 mg Oral Daily   aspirin   81 mg Oral Daily   atorvastatin   10 mg Oral QHS   carvedilol   12.5 mg Oral BID WC   cyclobenzaprine   10 mg Oral QHS   enoxaparin  (LOVENOX ) injection  40 mg Subcutaneous Q24H   insulin  aspart  0-6 Units Subcutaneous  TID WC   insulin  glargine-yfgn  30 Units Subcutaneous Daily   pantoprazole   40 mg Oral Daily   ranolazine   1,000 mg Oral BID   sodium chloride  flush  3 mL Intravenous Q12H   sodium chloride  flush  3 mL Intravenous Q12H   Continuous Infusions:  sodium chloride      PRN Meds: sodium chloride , albuterol , ibuprofen , nitroGLYCERIN , sodium chloride  flush  Allergies:    Allergies  Allergen Reactions   Banana Shortness Of Breath and Swelling   Cannabinoids Anaphylaxis   Dog Epithelium Hives and Itching   Grass Pollen(K-O-R-T-Swt Vern) Hives and Itching   Insect Wax Anaphylaxis    Any Insect that bites   Latex Shortness Of Breath   Lisinopril -Hydrochlorothiazide Swelling and Shortness Of Breath    Swelling (had epi by ems and required ED visit)   Onion Shortness Of Breath and Swelling   Penicillins Anaphylaxis, Swelling and Other (See Comments)  Has patient had a PCN reaction causing immediate rash, facial/tongue/throat swelling, SOB or lightheadedness with hypotension: Yes Has patient had a PCN reaction causing severe rash involving mucus membranes or skin necrosis: No Has patient had a PCN reaction that required hospitalization: Yes - inpatient Has patient had a PCN reaction occurring within the last 10 years: No If all of the above answers are NO, then may proceed with Cephalosporin use.    Tobacco Anaphylaxis   Tylenol [Acetaminophen] Other (See Comments)    Throat swelling    American Cockroach Hives   Cat Dander Hives   Murine-Derived Products Hives   Codeine Swelling   Hydrocodone Hives and Swelling   Other Other (See Comments)    Birds - sneezing & itching    Social History:   Social History   Socioeconomic History   Marital status: Married    Spouse name: antonio   Number of children: 6   Years of education: college   Highest education level: Not on file  Occupational History   Occupation: N/A  Tobacco Use   Smoking status: Former    Current packs/day:  0.00    Types: Cigarettes    Quit date: 01/25/2005    Years since quitting: 18.7   Smokeless tobacco: Never  Vaping Use   Vaping status: Never Used  Substance and Sexual Activity   Alcohol use: No   Drug use: No   Sexual activity: Yes    Partners: Male  Other Topics Concern   Not on file  Social History Narrative   Not on file   Social Drivers of Health   Financial Resource Strain: Low Risk  (06/30/2023)   Received from [facility]   Overall Financial Resource Strain (CARDIA)    Difficulty of Paying Living Expenses: Not very hard  Food Insecurity: No Food Insecurity (06/30/2023)   Received from [facility]   Hunger Vital Sign    Within the past 12 months, you worried that your food would run out before you got the money to buy more.: Never true    Within the past 12 months, the food you bought just didn't last and you didn't have money to get more.: Never true  Recent Concern: Food Insecurity - Food Insecurity Present (06/17/2023)   Received from [facility]   Hunger Vital Sign    Within the past 12 months, you worried that your food would run out before you got the money to buy more.: Never true    Within the past 12 months, the food you bought just didn't last and you didn't have money to get more.: Sometimes true  Transportation Needs: No Transportation Needs (06/30/2023)   Received from [facility] - Transportation    Lack of Transportation (Medical): No    Lack of Transportation (Non-Medical): No  Recent Concern: Transportation Needs - Unmet Transportation Needs (06/17/2023)   Received from [facility]   PRAPARE - Transportation    Lack of Transportation (Medical): Yes    Lack of Transportation (Non-Medical): Yes  Physical Activity: Sufficiently Active (06/30/2023)   Received from [facility]   Exercise Vital Sign    On average, how many days per week do you engage in moderate to strenuous exercise (like a brisk walk)?: 6 days    On average, how  many minutes do you engage in exercise at this level?: 30 min  Stress: Stress Concern Present (06/30/2023)   Received from [facility] -  Occupational Stress Questionnaire    Feeling of Stress : To some extent  Social Connections: Moderately Integrated (06/30/2023)   Received from [facility]   Social Network    How would you rate your social network (family, work, friends)?: Adequate participation with social networks  Intimate Partner Violence: Not At Risk (06/30/2023)   Received from [facility]   HITS    Over the last 12 months how often did your partner physically hurt you?: Never    Over the last 12 months how often did your partner insult you or talk down to you?: Never    Over the last 12 months how often did your partner threaten you with physical harm?: Never    Over the last 12 months how often did your partner scream or curse at you?: Never    Family History:    Family History  Problem Relation Age of Onset   Lung cancer Mother    Diabetes Father    Colon cancer Neg Hx      ROS:  Please see the history of present illness.   All other ROS reviewed and negative.     Physical Exam/Data:   Vitals:   11/07/23 0230 11/07/23 0256 11/07/23 0321 11/07/23 0322  BP: (!) 143/93 (!) 155/103 (!) 176/104 (!) 176/104  Pulse: 87 92  87  Resp: (!) 22 15    Temp:  98.7 F (37.1 C)    TempSrc:  Oral    SpO2: 99% 96%    Weight:  107.6 kg    Height:  4' 11 (1.499 m)     No intake or output data in the 24 hours ending 11/07/23 0334    11/07/2023    2:56 AM 11/06/2023    9:58 PM 09/17/2022    2:44 PM  Last 3 Weights  Weight (lbs) 237 lb 3.4 oz 223 lb 212 lb 15.4 oz  Weight (kg) 107.6 kg 101.152 kg 96.6 kg     Body mass index is 47.91 kg/m.  General:  Well nourished, well developed, in no acute distress HEENT: normal Neck: no JVD Vascular: No carotid bruits; Distal pulses 2+ bilaterally Cardiac:  normal S1, S2; RRR; no murmur,  marked ttp to light touch across sternum and left chest  Lungs:  clear to auscultation bilaterally, no wheezing, rhonchi or rales  Abd: soft, nontender, no hepatomegaly  Ext: no edema Musculoskeletal:  No deformities, BUE and BLE strength normal and equal Skin: warm and dry  Neuro: no focal abnormalities noted Psych:  Normal affect   Relevant CV Studies: Echo: 10/06/20  1. Left ventricular ejection fraction, by estimation, is 55 to 60%. The left ventricle has normal function. The left ventricle has no regional  wall motion abnormalities. Left ventricular diastolic parameters are consistent with Grade I diastolic dysfunction (impaired relaxation).   2. Right ventricular systolic function is normal. The right ventricular size is normal.   3. The mitral valve is grossly normal. No evidence of mitral valve regurgitation.   4. The aortic valve is tricuspid. Aortic valve regurgitation is not visualized.   5. The inferior vena cava is normal in size with greater than 50% respiratory variability, suggesting right atrial pressure of 3 mmHg.  Comparison(s): 02/09/2019: LVEF 45-50%.    Cath: 10/08/20  Ost LAD lesion is 25% stenosed. LV end diastolic pressure is mildly elevated. The left ventricular systolic function is normal. The left ventricular ejection fraction is 55-65% by visual estimate.   1. No obstructive CAD. Unchanged from  2020. 2. Normal LV function 3. Mildly elevated LVEDP  Patient   Laboratory Data:  High Sensitivity Troponin:   Recent Labs  Lab 11/06/23 2200 11/07/23 0014  TROPONINIHS 5 6     Chemistry Recent Labs  Lab 11/06/23 2200  NA 143  K 3.9  CL 109  CO2 22  GLUCOSE 125*  BUN 11  CREATININE 0.72  CALCIUM  9.0  GFRNONAA >60  ANIONGAP 12    No results for input(s): PROT, ALBUMIN, AST, ALT, ALKPHOS, BILITOT in the last 168 hours. Lipids No results for input(s): CHOL, TRIG, HDL, LABVLDL, LDLCALC, CHOLHDL in the last 168 hours.   Hematology Recent Labs  Lab 11/06/23 2200  WBC 8.6  RBC 5.40*  HGB 14.8  HCT 47.8*  MCV 88.5  MCH 27.4  MCHC 31.0  RDW 14.0  PLT 404*   Thyroid No results for input(s): TSH, FREET4 in the last 168 hours.  BNPNo results for input(s): BNP, PROBNP in the last 168 hours.  DDimer  Recent Labs  Lab 11/07/23 0130  DDIMER <0.27     Radiology/Studies:  DG Chest Portable 1 View Result Date: 11/06/2023 CLINICAL DATA:  Chest pain EXAM: PORTABLE CHEST 1 VIEW COMPARISON:  09/17/2022 FINDINGS: Borderline to mild cardiomegaly. Atelectasis or scarring at the left base. No acute airspace disease, pleural effusion or pneumothorax. IMPRESSION: Borderline to mild cardiomegaly. Atelectasis or scarring at the left base. Electronically Signed   By: Luke Bun M.D.   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  Patient with a history of recurrent chest pain with prior cath x2 in 2020, 2022 demonstrating non-obstructive CAD, most recently with 20-25% ostial LAD disease with preserved LVEF 55-60% with grade 1 diastolic dysfunction. She has been uptitrated on anti-anginals, though presented with recurrent symptoms. EKG/troponins reassuring. Clinically, her chest pain worsens with light touch and with movement of her torso and did not improve with nitroglycerin ; suggestive of an MSK etiology. Thus, it is unclear if these episodes represent true coronary vasospasms. Would recommend a trial of topical analgesics such as lidocaine  patch, diclofenac  gel and assess response. If there is ongoing clinicaly concern for coronary vasospasms, could consider further provocative testing with intracoronary acetylcholine or ergonovine to induce/document epicardial coronary vasospasm. Moreover, could also consider empiric treatment with more aggressive medical  management by transitioning ccb to verapamil and starting imdur .  --trial lidocaine  patch, diclofenac  gel and assess response --repeat EKG with chest pain to assess for acute ST changes  --continue asa 81mg  every day --continue atorvastatin  10mg  every day  --f/u lipid panel, goal LDL <70 --continue amlodipine  10mg  every day --continue carvedilol  12.5mg  bid --continue ranolazine  1000mg  bid --start imdur  30mg  daily in the AM   --continue nitroglycerin  prn --obtain repeat TTE --could consider provocative intracoronary testing  Risk Assessment/Risk Scores:   For questions or updates, please contact [facility] Please consult [email or web address] for contact info under   Signed, Earlene CHRISTELLA Cluster, MD  11/07/2023 3:34 AM

## 2023-11-08 ENCOUNTER — Inpatient Hospital Stay (HOSPITAL_COMMUNITY)

## 2023-11-08 DIAGNOSIS — R072 Precordial pain: Secondary | ICD-10-CM | POA: Diagnosis not present

## 2023-11-08 DIAGNOSIS — R0789 Other chest pain: Secondary | ICD-10-CM | POA: Diagnosis not present

## 2023-11-08 LAB — CBC
HCT: 38.6 % (ref 36.0–46.0)
Hemoglobin: 12.1 g/dL (ref 12.0–15.0)
MCH: 27.8 pg (ref 26.0–34.0)
MCHC: 31.3 g/dL (ref 30.0–36.0)
MCV: 88.7 fL (ref 80.0–100.0)
Platelets: 317 K/uL (ref 150–400)
RBC: 4.35 MIL/uL (ref 3.87–5.11)
RDW: 13.9 % (ref 11.5–15.5)
WBC: 7.1 K/uL (ref 4.0–10.5)
nRBC: 0 % (ref 0.0–0.2)

## 2023-11-08 LAB — BASIC METABOLIC PANEL WITH GFR
Anion gap: 9 (ref 5–15)
BUN: 13 mg/dL (ref 6–20)
CO2: 24 mmol/L (ref 22–32)
Calcium: 8.4 mg/dL — ABNORMAL LOW (ref 8.9–10.3)
Chloride: 108 mmol/L (ref 98–111)
Creatinine, Ser: 0.8 mg/dL (ref 0.44–1.00)
GFR, Estimated: >60 mL/min (ref >60)
Glucose, Bld: 116 mg/dL — ABNORMAL HIGH (ref 70–99)
Potassium: 3.7 mmol/L (ref 3.5–5.1)
Sodium: 141 mmol/L (ref 135–145)

## 2023-11-08 LAB — GLUCOSE, CAPILLARY
Glucose-Capillary: 104 mg/dL — ABNORMAL HIGH (ref 70–99)
Glucose-Capillary: 103 mg/dL — ABNORMAL HIGH (ref 70–99)

## 2023-11-08 LAB — HEMOGLOBIN A1C
Hgb A1c MFr Bld: 8.5 % — ABNORMAL HIGH (ref 4.8–5.6)
Mean Plasma Glucose: 197 mg/dL

## 2023-11-08 MED ORDER — OXYCODONE HCL 5 MG PO TABS: 5.0000 mg | ORAL_TABLET | Freq: Four times a day (QID) | ORAL | Status: DC | PRN

## 2023-11-08 MED ORDER — LIDOCAINE 5 % EX PTCH: 1.0000 | MEDICATED_PATCH | CUTANEOUS | 0 refills | Status: AC

## 2023-11-08 MED ORDER — DICLOFENAC SODIUM 1 % EX GEL: 2.0000 g | Freq: Three times a day (TID) | CUTANEOUS | 0 refills | Status: AC

## 2023-11-08 NOTE — Discharge Instructions (Signed)
 Make sure not to take ibuprofen  and meloxicam together as these are both NSAID medications.

## 2023-11-08 NOTE — Progress Notes (Signed)
  Progress Note  Patient Name: [name] Date of Encounter: 11/08/2023 [facility] Cardiologist: Lamar Fitch, MD   Interval Summary   Chest pain is positional and improved with lidocaine  patch.  She has a headache.  Blood pressure is well-controlled. Echocardiogram pending.  Vital Signs Vitals:   11/07/23 2000 11/07/23 2251 11/08/23 0417 11/08/23 0724  BP: 120/68 123/79 124/76 130/79  Pulse: 75 77 75 75  Resp: 15 19 17 15   Temp:  98.1 F (36.7 C) 98.3 F (36.8 C) 97.8 F (36.6 C)  TempSrc:  Oral Oral Oral  SpO2: 96% 92% 99% 94%  Weight:      Height:        Intake/Output Summary (Last 24 hours) at 11/08/2023 0855 Last data filed at 11/07/2023 2100 Gross per 24 hour  Intake 240 ml  Output --  Net 240 ml      11/07/2023    2:56 AM 11/06/2023    9:58 PM 09/17/2022    2:44 PM  Last 3 Weights  Weight (lbs) 237 lb 3.4 oz 223 lb 212 lb 15.4 oz  Weight (kg) 107.6 kg 101.152 kg 96.6 kg      Telemetry/ECG  Normal sinus rhythm.- Personally Reviewed  Physical Exam  GEN: No acute distress.  Morbidly obese. Neck: No JVD Cardiac: RRR, no murmurs, rubs, or gallops.  Respiratory: Clear to auscultation bilaterally. GI: Soft, nontender, non-distended  MS: No edema  Assessment & Plan  Chest pain appears to be musculoskeletal.  Discontinue isosorbide  which is probably causing her headache. Will follow-up on the echocardiogram which is scheduled be performed around noon today, but anticipate will show normal findings [facility] will sign off.   The patient is ready for discharge today from a cardiac standpoint. Medication Recommendations:   Felodipine 10 mg daily Atorvastatin  10 mg daily Aspirin  81 mg daily Carvedilol  12.5 mg twice daily Ranolazine  1000 mg twice daily Other recommendations (labs, testing, etc): Per primary team Follow up as an outpatient: Will schedule follow-up with Dr. Krasowski For questions or updates, please contact [facility] Please consult [email or web address] for contact info under       Signed, Jerel Balding, MD

## 2023-11-08 NOTE — TOC Transition Note (Signed)
  Transition of Care [facility]) - Discharge Note   Patient Details  Name: [name] MRN: [id number] Date of Birth: [birth year]  [facility] CM/SW Contact:  Roxie KANDICE Stain, RN Phone Number: 11/08/2023, 12:10 PM   Clinical Narrative:    Britanny Marksberry is stable to discharge home.  No TOC needs at this time.   Final next level of care: Home/Self Care Barriers to Discharge: Barriers Resolved   Patient Goals and CMS Choice Patient states their goals for this hospitalization and ongoing recovery are:: return home          Discharge Placement  home                     Discharge Plan and Services Additional resources added to the After Visit Summary for                                       Social Drivers of Health (SDOH) Interventions SDOH Screenings   Food Insecurity: No Food Insecurity (11/07/2023)  Housing: Low Risk  (11/07/2023)  Transportation Needs: No Transportation Needs (11/07/2023)  Utilities: Not At Risk (11/07/2023)  Financial Resource Strain: Low Risk  (06/30/2023)   Received from [facility]  Physical Activity: Sufficiently Active (06/30/2023)   Received from [facility]  Social Connections: Moderately Integrated (06/30/2023)   Received from [facility]  Stress: Stress Concern Present (06/30/2023)   Received from [facility]  Tobacco Use: Medium Risk (11/07/2023)     Readmission Risk Interventions    11/08/2023   12:08 PM  Readmission Risk Prevention Plan  Post Dischage Appt Complete  Medication Screening Complete  Transportation Screening Complete

## 2023-11-08 NOTE — Discharge Summary (Signed)
 Physician Discharge Summary   Patient: [name] MRN: [id number] DOB: 30-Dec-1965  Admit date:     11/06/2023  Discharge date: 11/08/23  Discharge Physician: Reyes VEAR Gaw   PCP: Pura Lenis, MD   Recommendations at discharge:   Patient discharged home with lidocaine  patch and topical Voltaren  gel which were the 2 things actually for pain.  Deemed musculoskeletal pain and recommended follow-up with PCP within the week and cardiology within the next 4 weeks.  Patient reports she has meloxicam prescription at home.  This was not on her med list.  Instructed not to take ibuprofen  with meloxicam as these are both NSAIDs. Echocardiogram completed today prior to her leaving.  However read still not back late this afternoon.  Please follow-up with results of echocardiogram and confer results to patient.     Discharge Diagnoses: Principal Problem:   Chest pain Active Problems:   Unstable angina (HCC)   Essential hypertension   GERD (gastroesophageal reflux disease)   Insulin  dependent type 2 diabetes mellitus (HCC)   Obstructive sleep apnea   Fibromyalgia   History of coronary vasospasm   Hyperlipidemia   Chronic diastolic CHF (congestive heart failure) (HCC)   QT prolongation  Resolved Problems:   * No resolved hospital problems. *  Hospital Course: 58 y.o. 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  Patient is stating that send chest pain started yesterday and it was not bad until today.  Reported that intermittent chest pain that started worsening around 3 AM today morning feeling like there is a elephant sitting on her chest.  Patient tried sublingual nitro glycerin at home without any relief.  Also complaining  lightheaded with some nausea, vomiting in the ambulance.  Patient received aspirin  load and 1 sublingual nitroglycerin  by EMS without any resolution of chest pain.  Admitted for chest pain and cardiology consulted.  Chest pain continued throughout admission.  Did not respond to nitroglycerin .  Lidocaine  patch and topical Voltaren  gel applied to her sternum.  This caused reduction in her pain.  Pain remains reproducible on examination.  Cardiology followed while in house and deemed noncardiac chest pain, most likely musculoskeletal.  They stopped isosorbide  as that was likely causing her to have a headache.  She did have echocardiogram prior to discharge.  However the results of the echocardiogram are still pending several hours after she was discharged.  Recommendation as above for PCP/cardiologist seeing her in follow-up to convey results to patient.  Also prior to discharge patient reported that she had been taking meloxicam at home for pain which had been helpful.  This is not her new medication list.  Recommended to discuss prescription with her primary care doc and not to take this with ibuprofen  or other NSAIDs.  Due to degree of improvement she was discharged home on 11/08/2023.     Assessment & Plan: Chest pain, most likely noncardiac/musculoskeletal - Cardiology following.  Underwent left heart cath in 2020 and 2022 which showed nonobstructive CAD. - Troponins have been normal here. - Cardiology continue to follow and plan to rule out coronary vasospasm by tomorrow as cause of pain. - Pain is reproducible on palpation of her chest even with light palpation.  She does report that is improving status post placement of lidocaine  patch -Discharged home with Voltaren  gel plus  lidocaine  patch.   Essential hypertension Chronic diastolic heart failure -Continue amlodipine  and Toprol-XL.  Continue hydralazine  as needed.   Obstructive sleep apnea -Continue CPAP at bedtime. -Patient refused to wear  CPAP in the hospital.   Fibromyalgia -Continue Zanaflex as needed   Generalized anxiety disorder Mood disorder - Pending verification of home antianxieties and antipsychotic medications.   Insulin -dependent DM type II Continue long-acting insulin  and sliding scale insulin  with mealtime coverage.  Morbid obesity: - Contributing to risk of cardiac issues and likely also chest pain.      Consultants: Cardiology Procedures performed: None Disposition: Home Diet recommendation:  Discharge Diet Orders (From admission, onward)     Start     Ordered   11/08/23 0000  Diet - low sodium heart healthy        11/08/23 1135           Low-sodium heart healthy DISCHARGE MEDICATION: Allergies as of 11/08/2023       Reactions   Banana Shortness Of Breath, Swelling   Cannabinoids Anaphylaxis   Dog Epithelium Hives, Itching   Grass Pollen(k-o-r-t-swt Vern) Hives, Itching   Insect Wax Anaphylaxis   Any Insect that bites   Latex Shortness Of Breath   Lisinopril -hydrochlorothiazide Swelling, Shortness Of Breath   Swelling (had epi by ems and required ED visit)   Onion Shortness Of Breath, Swelling   Penicillins Anaphylaxis, Swelling, Other (See Comments)   Has patient had a PCN reaction causing immediate rash, facial/tongue/throat swelling, SOB or lightheadedness with hypotension: Yes Has patient had a PCN reaction causing severe rash involving mucus membranes or skin necrosis: No Has patient had a PCN reaction that required hospitalization: Yes - inpatient Has patient had a PCN reaction occurring within the last 10 years: No If all of the above answers are NO, then may proceed with Cephalosporin use.   Tobacco Anaphylaxis   Tylenol [acetaminophen] Other (See Comments)   Throat swelling    American Cockroach Hives   Cat Dander Hives   Murine-derived Products Hives   Codeine Swelling   Hydrocodone Hives, Swelling   Other Other (See Comments)   Birds - sneezing & itching         Medication List     TAKE these medications    amLODipine  10 MG tablet Commonly known as: NORVASC  Take 1 tablet (10 mg total) by mouth daily. Patient needs an appointment for further refills. 3 rd/ final attempt   aspirin  81 MG chewable tablet Chew 81 mg by mouth daily.   atorvastatin  10 MG tablet Commonly known as: LIPITOR Take 10 mg by mouth at bedtime.   Basaglar  KwikPen 100 UNIT/ML Inject 36 Units into the skin 2 (two) times daily.   Breo Ellipta  200-25 MCG/INH Aepb Generic drug: fluticasone  furoate-vilanterol Inhale 1 puff into the lungs daily.   calcium  carbonate 1250 (500 Ca) MG tablet Commonly known as: OS-CAL - dosed in mg of elemental calcium  Take 1 tablet by mouth daily with breakfast. Unknown strenght   carvedilol  12.5 MG tablet Commonly known as: COREG  Take 1 tablet (12.5 mg total) by mouth 2 (two) times daily. Patient needs an appointment for further refills. 3 rd/ final attempt   cholecalciferol 25 MCG (1000 UNIT) tablet Commonly known as: VITAMIN D3 Take 1,000 Units by mouth daily.   cyclobenzaprine  10 MG tablet Commonly known as: FLEXERIL  Take 10 mg by mouth at bedtime.   diazepam  10 MG tablet Commonly known as: VALIUM  Take 10 mg by mouth 4 (four) times daily.  EPINEPHrine  0.3 mg/0.3 mL Soaj injection Commonly known as: EPI-PEN Inject 0.3 mg into the muscle as needed for anaphylaxis.   fluticasone  50 MCG/ACT nasal spray Commonly known as: FLONASE  Place 2 sprays into both nostrils daily as needed for allergies.   hydrOXYzine  50 MG tablet Commonly known as: ATARAX  Take 50 mg by mouth daily.   montelukast  10 MG tablet Commonly known as: SINGULAIR  Take 10 mg by mouth daily.   nitroGLYCERIN  0.3 MG SL tablet Commonly known as: NITROSTAT  Place 0.3 mg under the tongue every 5 (five) minutes as needed for chest pain.   omeprazole 20 MG capsule Commonly known as: PRILOSEC Take 20 mg by mouth daily.   polyethylene glycol 17 g  packet Commonly known as: MIRALAX  / GLYCOLAX  Take 17 g by mouth daily.   QUEtiapine  200 MG tablet Commonly known as: SEROQUEL  Take 200 mg by mouth at bedtime.   raloxifene 60 MG tablet Commonly known as: EVISTA Take 60 mg by mouth daily.   RANITIDINE  150 MAX STRENGTH PO Take 150 mg by mouth daily as needed (allergy).   ranolazine  1000 MG SR tablet Commonly known as: RANEXA  Take 1 tablet (1,000 mg total) by mouth 2 (two) times daily. Patient needs an appointment for further refills. 3 rd/ final attempt   rOPINIRole  4 MG tablet Commonly known as: REQUIP  Take 4 mg by mouth at bedtime.   Trulicity 3 MG/0.5ML Soaj Generic drug: Dulaglutide Inject 3 mg into the skin once a week. Tuesdays   Ventolin  HFA 108 (90 Base) MCG/ACT inhaler Generic drug: albuterol  Inhale 2 puffs into the lungs every 6 (six) hours as needed for wheezing or shortness of breath.   Vraylar 3 MG capsule Generic drug: cariprazine Take 3 mg by mouth every morning.        Discharge Exam: Filed Weights   11/06/23 2158 11/07/23 0256  Weight: 101.2 kg 107.6 kg   Gen: 58 y.o. female in no apparent distress.  Nontoxic Pulm: Non-labored breathing.  Clear to auscultation bilaterally.  CV: Regular rate and rhythm. No murmur, rub, or gallop. No JVD Chest: Tender to palpation across upper sternum, wearing lidocaine  patch. GI: Abdomen soft, non-tender, non-distended, with normoactive bowel sounds. No organomegaly or masses felt.  Obese Ext: Warm, no deformities, no pedal edema Skin: No rashes, lesions no ulcers Neuro: Alert and oriented. No focal neurological deficits. Psych: Calm  Judgement and insight appear normal. Mood & affect appropriate.    Condition at discharge: Good  The results of significant diagnostics from this hospitalization (including imaging, microbiology, ancillary and laboratory) are listed below for reference.   Imaging Studies: DG Chest Portable 1 View Result Date: 11/06/2023 CLINICAL  DATA:  Chest pain EXAM: PORTABLE CHEST 1 VIEW COMPARISON:  09/17/2022 FINDINGS: Borderline to mild cardiomegaly. Atelectasis or scarring at the left base. No acute airspace disease, pleural effusion or pneumothorax. IMPRESSION: Borderline to mild cardiomegaly. Atelectasis or scarring at the left base. Electronically Signed   By: Luke Bun M.D.   On: 11/06/2023 22:17    Microbiology: Results for orders placed or performed during the hospital encounter of 11/06/23  MRSA Next Gen by PCR, Nasal     Status: None   Collection Time: 11/07/23  3:42 AM   Specimen: Nasal Mucosa; Nasal Swab  Result Value Ref Range Status   MRSA by PCR Next Gen NOT DETECTED NOT DETECTED Final    Comment: (NOTE) The GeneXpert MRSA Assay (FDA approved for NASAL specimens only), is one component of a comprehensive MRSA colonization  surveillance program. It is not intended to diagnose MRSA infection nor to guide or monitor treatment for MRSA infections. Test performance is not FDA approved in patients less than 60 years old. Performed at [facility], [address] [place], [place] [address]     Labs: CBC: Recent Labs  Lab 11/06/23 2200 11/07/23 0457 11/08/23 0234  WBC 8.6 7.4 7.1  HGB 14.8 13.1 12.1  HCT 47.8* 41.8 38.6  MCV 88.5 88.7 88.7  PLT 404* 328 317   Basic Metabolic Panel: Recent Labs  Lab 11/06/23 2200 11/07/23 0457 11/07/23 0750 11/08/23 0234  NA 143 141  --  141  K 3.9 3.7  --  3.7  CL 109 109  --  108  CO2 22 23  --  24  GLUCOSE 125* 115*  --  116*  BUN 11 12  --  13  CREATININE 0.72 0.66  --  0.80  CALCIUM  9.0 8.4*  --  8.4*  MG  --   --  1.8  --    Liver Function Tests: No results for input(s): AST, ALT, ALKPHOS, BILITOT, PROT, ALBUMIN in the last 168 hours. CBG: Recent Labs  Lab 11/07/23 1127 11/07/23 1533 11/07/23 2103 11/08/23 0606 11/08/23 1057  GLUCAP 101* 129* 110* 104* 103*    Discharge time spent: Less than 30 minutes.  Signed: Reyes VEAR Gaw, MD [facility] 11/08/2023

## 2023-12-04 ENCOUNTER — Emergency Department (HOSPITAL_COMMUNITY)
Admission: EM | Admit: 2023-12-04 | Discharge: 2023-12-04 | Disposition: A | Discharge status: Home / Self Care | Attending: Emergency Medicine | Admitting: Emergency Medicine

## 2023-12-04 ENCOUNTER — Other Ambulatory Visit: Payer: Self-pay

## 2023-12-04 DIAGNOSIS — T7840XA Allergy, unspecified, initial encounter: Secondary | ICD-10-CM | POA: Insufficient documentation

## 2023-12-04 DIAGNOSIS — Z7982 Long term (current) use of aspirin: Secondary | ICD-10-CM | POA: Insufficient documentation

## 2023-12-04 DIAGNOSIS — Z91040 Latex allergy status: Secondary | ICD-10-CM | POA: Insufficient documentation

## 2023-12-04 DIAGNOSIS — I509 Heart failure, unspecified: Secondary | ICD-10-CM | POA: Insufficient documentation

## 2023-12-04 DIAGNOSIS — I110 Hypertensive heart disease with heart failure: Secondary | ICD-10-CM | POA: Insufficient documentation

## 2023-12-04 DIAGNOSIS — Z79899 Other long term (current) drug therapy: Secondary | ICD-10-CM | POA: Diagnosis not present

## 2023-12-04 LAB — CBC WITH DIFFERENTIAL/PLATELET
Abs Immature Granulocytes: 0.02 K/uL (ref 0.00–0.07)
Basophils Absolute: 0 K/uL (ref 0.0–0.1)
Basophils Relative: 0 %
Eosinophils Absolute: 0.2 K/uL (ref 0.0–0.5)
Eosinophils Relative: 2 %
HCT: 43.1 % (ref 36.0–46.0)
Hemoglobin: 13.5 g/dL (ref 12.0–15.0)
Immature Granulocytes: 0 %
Lymphocytes Relative: 54 %
Lymphs Abs: 4.6 K/uL — ABNORMAL HIGH (ref 0.7–4.0)
MCH: 28 pg (ref 26.0–34.0)
MCHC: 31.3 g/dL (ref 30.0–36.0)
MCV: 89.4 fL (ref 80.0–100.0)
Monocytes Absolute: 0.4 K/uL (ref 0.1–1.0)
Monocytes Relative: 5 %
Neutro Abs: 3.4 K/uL (ref 1.7–7.7)
Neutrophils Relative %: 39 %
Platelets: 397 K/uL (ref 150–400)
RBC: 4.82 MIL/uL (ref 3.87–5.11)
RDW: 15.5 % (ref 11.5–15.5)
WBC: 8.6 K/uL (ref 4.0–10.5)
nRBC: 0 % (ref 0.0–0.2)

## 2023-12-04 LAB — COMPREHENSIVE METABOLIC PANEL WITH GFR
ALT: 22 U/L (ref 0–44)
AST: 29 U/L (ref 15–41)
Albumin: 3.8 g/dL (ref 3.5–5.0)
Alkaline Phosphatase: 101 U/L (ref 38–126)
Anion gap: 10 (ref 5–15)
BUN: 8 mg/dL (ref 6–20)
CO2: 24 mmol/L (ref 22–32)
Calcium: 8.7 mg/dL — ABNORMAL LOW (ref 8.9–10.3)
Chloride: 106 mmol/L (ref 98–111)
Creatinine, Ser: 0.7 mg/dL (ref 0.44–1.00)
GFR, Estimated: >60 mL/min (ref >60)
Glucose, Bld: 184 mg/dL — ABNORMAL HIGH (ref 70–99)
Potassium: 4 mmol/L (ref 3.5–5.1)
Sodium: 140 mmol/L (ref 135–145)
Total Bilirubin: 0.7 mg/dL (ref 0.0–1.2)
Total Protein: 7.1 g/dL (ref 6.5–8.1)

## 2023-12-04 MED ORDER — EPINEPHRINE 0.3 MG/0.3ML IJ SOAJ: 0.3000 mg | INTRAMUSCULAR | 1 refills | Status: AC | PRN

## 2023-12-04 MED ORDER — METHYLPREDNISOLONE SODIUM SUCC 125 MG IJ SOLR
125.0000 mg | Freq: Once | INTRAMUSCULAR | Status: AC
  Administered 2023-12-04: 125 mg via INTRAVENOUS
  Filled 2023-12-04: qty 2

## 2023-12-04 MED ORDER — PREDNISONE 20 MG PO TABS: ORAL_TABLET | ORAL | 0 refills | Status: AC

## 2023-12-04 MED ORDER — FAMOTIDINE IN NACL 20-0.9 MG/50ML-% IV SOLN
20.0000 mg | Freq: Once | INTRAVENOUS | Status: AC
  Administered 2023-12-04: 20 mg via INTRAVENOUS
  Filled 2023-12-04: qty 50

## 2023-12-04 MED ORDER — DIPHENHYDRAMINE HCL 50 MG/ML IJ SOLN
25.0000 mg | Freq: Once | INTRAMUSCULAR | Status: AC
  Administered 2023-12-04: 25 mg via INTRAVENOUS
  Filled 2023-12-04: qty 1

## 2023-12-04 MED ORDER — ONDANSETRON HCL 4 MG/2ML IJ SOLN
4.0000 mg | Freq: Once | INTRAMUSCULAR | Status: AC
  Administered 2023-12-04: 4 mg via INTRAVENOUS
  Filled 2023-12-04: qty 2

## 2023-12-04 NOTE — ED Notes (Signed)
 Pt reports improvement in symptoms after medication administration. Appears lying comfortably in bed.

## 2023-12-04 NOTE — ED Triage Notes (Signed)
 Patient reports generalized skin itching/throat discomfort and tongue swelling this evening after eating onions , airway intact/respirations unlabored . She took Benadryl  prior to arrival with mild relief.

## 2023-12-04 NOTE — ED Provider Notes (Signed)
 [place] EMERGENCY DEPARTMENT AT [facility] Provider Note   CSN: [id number] Arrival date & time: 12/04/23  0100     Patient presents with: Allergic Reaction   [name] is a 58 y.o. female.   The history is provided by the patient and medical records.  Allergic Reaction  58 year old female with history of anemia, anxiety, CHF, hypertension, fibromyalgia, hyperlipidemia, presenting to the ED after allergic reaction.  She inadvertently ate some onions around 9 PM.  Began having itchy sensation and scratchy throat around 9:30 PM.  She took 25 mg oral Benadryl  at home which seemed to help a little bit but still feeling like she is a little short of breath and very itchy.  She has required epinephrine  in the past.  No prior intubations due to reaction.  Prior to Admission medications   Medication Sig Start Date End Date Taking? Authorizing Provider  albuterol  (VENTOLIN  HFA) 108 (90 Base) MCG/ACT inhaler Inhale 2 puffs into the lungs every 6 (six) hours as needed for wheezing or shortness of breath.    [provider]  amLODipine  (NORVASC ) 10 MG tablet Take 1 tablet (10 mg total) by mouth daily. Patient needs an appointment for further refills. 3 rd/ final attempt 12/24/22   Bernie Lamar PARAS, MD  aspirin  81 MG chewable tablet Chew 81 mg by mouth daily.    [provider]  atorvastatin  (LIPITOR) 10 MG tablet Take 10 mg by mouth at bedtime.    [provider]  BREO ELLIPTA  200-25 MCG/INH AEPB Inhale 1 puff into the lungs daily. 10/30/20   [provider]  calcium  carbonate (OS-CAL - DOSED IN MG OF ELEMENTAL CALCIUM ) 1250 (500 Ca) MG tablet Take 1 tablet by mouth daily with breakfast. Unknown strenght    [provider]  carvedilol  (COREG ) 12.5 MG tablet Take 1 tablet (12.5 mg total) by mouth 2 (two) times daily. Patient needs an appointment for further refills. 3 rd/ final attempt 12/24/22   Bernie Lamar PARAS, MD  cholecalciferol  (VITAMIN D3) 25 MCG (1000 UNIT) tablet Take 1,000 Units by mouth daily.    [provider]  cyclobenzaprine  (FLEXERIL ) 10 MG tablet Take 10 mg by mouth at bedtime. 09/28/20   [provider]  diazepam  (VALIUM ) 10 MG tablet Take 10 mg by mouth 4 (four) times daily.    [provider]  diclofenac  Sodium (VOLTAREN ) 1 % GEL Apply 2 g topically every 8 (eight) hours. 11/08/23   Elpidio Reyes DEL, MD  EPINEPHrine  0.3 mg/0.3 mL IJ SOAJ injection Inject 0.3 mg into the muscle as needed for anaphylaxis. Patient not taking: Reported on 11/07/2023 06/21/20   Micky Camellia BROCKS, MD  fluticasone  (FLONASE ) 50 MCG/ACT nasal spray Place 2 sprays into both nostrils daily as needed for allergies.     [provider]  hydrOXYzine  (ATARAX ) 50 MG tablet Take 50 mg by mouth daily. 09/03/22   [provider]  Insulin  Glargine (BASAGLAR  KWIKPEN) 100 UNIT/ML Inject 36 Units into the skin 2 (two) times daily. 11/07/20   [provider]  lidocaine  (LIDODERM ) 5 % Place 1 patch onto the skin daily. Remove & Discard patch within 12 hours or as directed by MD 11/09/23   Elpidio Reyes DEL, MD  montelukast  (SINGULAIR ) 10 MG tablet Take 10 mg by mouth daily. 10/06/17   [provider]  nitroGLYCERIN  (NITROSTAT ) 0.3 MG SL tablet Place 0.3 mg under the tongue every 5 (five) minutes as needed for chest pain. 11/07/20   [provider]  omeprazole (PRILOSEC) 20 MG capsule Take 20 mg by mouth daily.    [provider]  polyethylene glycol (MIRALAX  / GLYCOLAX ) 17 g packet Take 17 g by mouth daily. Patient not taking: Reported on 11/07/2023    [provider]  QUEtiapine  (SEROQUEL ) 200 MG tablet Take 200 mg by mouth at bedtime. 09/28/20   [provider]  raloxifene (EVISTA) 60 MG tablet Take 60 mg by mouth daily. 02/14/19   [provider]  raNITIdine  HCl (RANITIDINE  150 MAX STRENGTH PO) Take 150 mg by mouth daily as needed (allergy).    [provider]  ranolazine  (RANEXA ) 1000 MG SR tablet Take 1 tablet (1,000 mg total) by mouth 2 (two) times daily. Patient needs an appointment for further refills. 3 rd/ final attempt 12/24/22   Bernie Lamar PARAS, MD  rOPINIRole  (REQUIP ) 4 MG tablet Take 4 mg by mouth at bedtime. 08/20/20   [provider]  TRULICITY 3 MG/0.5ML SOPN Inject 3 mg into the skin once a week. Tuesdays 02/10/21   [provider]  VRAYLAR 3 MG capsule Take 3 mg by mouth every morning. 08/21/22   [provider]    Allergies: Banana, Cannabinoids, Dog epithelium, Grass pollen(k-o-r-t-swt vern), Insect wax, Latex, Lisinopril -hydrochlorothiazide, Onion, Penicillins, Tobacco, Tylenol [acetaminophen], American cockroach, Cat dander, Murine-derived products, Codeine, Hydrocodone, and Other    Review of Systems  Constitutional:        Allergic reaction  All other systems reviewed and are negative.   Updated Vital Signs BP (!) 156/108   Pulse 99   Temp 98.8 F (37.1 C)   Resp 18   SpO2 96%   Physical Exam Vitals and nursing note reviewed.  Constitutional:      Appearance: She is well-developed.  HENT:     Head: Normocephalic and atraumatic.     Mouth/Throat:     Comments: No visible lip or tongue swelling, handling secretions well, normal phonation without stridor Eyes:     Conjunctiva/sclera: Conjunctivae normal.     Pupils: Pupils are equal, round, and reactive to light.  Cardiovascular:     Rate and Rhythm: Normal rate and regular rhythm.     Heart sounds: Normal heart sounds.  Pulmonary:     Effort: Pulmonary effort is normal.     Breath sounds: Normal breath sounds.  Abdominal:     General: Bowel sounds are normal.     Palpations: Abdomen is soft.  Musculoskeletal:        General: Normal range of motion.     Cervical back: Normal range of motion.  Skin:    General: Skin is warm and dry.     Comments: Splotchy appearing rash across upper extremities, torso, and neck, no  rash on palms  Neurological:     Mental Status: She is alert and oriented to person, place, and time.     (all labs ordered are listed, but only abnormal results are displayed) Labs Reviewed  CBC WITH DIFFERENTIAL/PLATELET - Abnormal; Notable for the following components:      Result Value   Lymphs Abs 4.6 (*)    All other components within normal limits  COMPREHENSIVE METABOLIC PANEL WITH GFR - Abnormal; Notable for the following components:   Glucose, Bld 184 (*)    Calcium  8.7 (*)    All other components within normal limits    EKG: None  Radiology: No results found.   Procedures   CRITICAL CARE Performed by: Olam CHRISTELLA Slocumb  Total critical care time: 35 minutes  Critical care time was exclusive of separately billable procedures and treating other patients.  Critical care was necessary to treat or prevent imminent or life-threatening deterioration.  Critical care was time spent personally by me on the following activities: development of treatment plan with patient and/or surrogate as well as nursing, discussions with consultants, evaluation of patient's response to treatment, examination of patient, obtaining history from patient or surrogate, ordering and performing treatments and interventions, ordering and review of laboratory studies, ordering and review of radiographic studies, pulse oximetry and re-evaluation of patient's condition.   Medications Ordered in the ED  diphenhydrAMINE  (BENADRYL ) injection 25 mg (25 mg Intravenous Given 12/04/23 0132)  methylPREDNISolone  sodium succinate (SOLU-MEDROL ) 125 mg/2 mL injection 125 mg (125 mg Intravenous Given 12/04/23 0134)  famotidine  (PEPCID ) IVPB 20 mg premix (0 mg Intravenous Stopped 12/04/23 0213)  ondansetron  (ZOFRAN ) injection 4 mg (4 mg Intravenous Given 12/04/23 0138)                                    Medical Decision Making Amount and/or Complexity of Data Reviewed Labs: ordered. ECG/medicine tests:  ordered and independent interpretation performed.  Risk Prescription drug management.   58 year old female presenting to the ED with allergic reaction after accidentally ingesting onions.  She has had similar reaction in the past.  She did take Benadryl  prior to arrival which seemed to help a little bit.  Afebrile and nontoxic in appearance here.  She does not have any visible lip or tongue swelling.  She is handling secretions well, normal phonation without stridor.  Lung sounds are clear.  Does have splotchy appearing rash to her forearms, chest, and neck.  Does not appear to extend to the face.  She is hemodynamically stable presently.  Will hold off on epinephrine  for now.  Given IV Benadryl , Solu-Medrol , and Pepcid .  6:08 AM Patient has been monitored here for 4+ hours.  She has not had any rebound in her symptoms.  She overall appears improved.  Rash seems to have dissipated.  She remains without any airway compromise.  Feel she is stable for discharge home with continued symptomatic care.  I have sent in prescription for EpiPen  for her.  Encouraged to follow-up with PCP.  Return here for new concerns.  Final diagnoses:  Allergic reaction, initial encounter    ED Discharge Orders          Ordered    predniSONE  (DELTASONE ) 20 MG tablet        12/04/23 0612    EPINEPHrine  0.3 mg/0.3 mL IJ SOAJ injection  As needed        12/04/23 0612               Jarold Olam HERO, PA-C 12/04/23 [id number]    Haze Lonni PARAS, MD 12/05/23 [phone]

## 2023-12-04 NOTE — Discharge Instructions (Addendum)
 Take the prescribed medication as directed.  Keep your EpiPen  with you at all times.  If this is used, you do need to come to the hospital for monitoring. Follow-up with your primary care doctor. Return to the ED for new or worsening symptoms.

## 2024-05-12 ENCOUNTER — Other Ambulatory Visit: Payer: Self-pay

## 2024-05-12 DIAGNOSIS — F17211 Nicotine dependence, cigarettes, in remission: Secondary | ICD-10-CM
